# Patient Record
Sex: Male | Born: 1950 | Race: Black or African American | Hispanic: No | Smoking: Never smoker
Health system: Southern US, Community
[De-identification: ages and names within clinical notes are randomized; demographics above are authoritative.]

## PROBLEM LIST (undated history)

## (undated) DIAGNOSIS — E119 Type 2 diabetes mellitus without complications: Secondary | ICD-10-CM

## (undated) DIAGNOSIS — F1990 Other psychoactive substance use, unspecified, uncomplicated: Secondary | ICD-10-CM

## (undated) DIAGNOSIS — M1990 Unspecified osteoarthritis, unspecified site: Secondary | ICD-10-CM

## (undated) DIAGNOSIS — G40909 Epilepsy, unspecified, not intractable, without status epilepticus: Secondary | ICD-10-CM

## (undated) DIAGNOSIS — F1011 Alcohol abuse, in remission: Secondary | ICD-10-CM

## (undated) DIAGNOSIS — R569 Unspecified convulsions: Secondary | ICD-10-CM

## (undated) DIAGNOSIS — E785 Hyperlipidemia, unspecified: Secondary | ICD-10-CM

## (undated) DIAGNOSIS — S0990XA Unspecified injury of head, initial encounter: Secondary | ICD-10-CM

## (undated) DIAGNOSIS — Z950 Presence of cardiac pacemaker: Secondary | ICD-10-CM

## (undated) DIAGNOSIS — I10 Essential (primary) hypertension: Secondary | ICD-10-CM

## (undated) DIAGNOSIS — F1010 Alcohol abuse, uncomplicated: Secondary | ICD-10-CM

## (undated) HISTORY — DX: Presence of cardiac pacemaker: Z95.0

## (undated) HISTORY — DX: Other psychoactive substance use, unspecified, uncomplicated: F19.90

## (undated) HISTORY — DX: Alcohol abuse, in remission: F10.11

## (undated) HISTORY — PX: OTHER SURGICAL HISTORY: SHX169

## (undated) HISTORY — DX: Epilepsy, unspecified, not intractable, without status epilepticus: G40.909

## (undated) HISTORY — DX: Type 2 diabetes mellitus without complications: E11.9

## (undated) HISTORY — DX: Alcohol abuse, uncomplicated: F10.10

## (undated) HISTORY — DX: Hyperlipidemia, unspecified: E78.5

---

## 1978-07-06 HISTORY — PX: LACERATION REPAIR: SHX5168

## 1997-12-10 ENCOUNTER — Ambulatory Visit (HOSPITAL_COMMUNITY): Admission: RE | Admit: 1997-12-10 | Discharge: 1997-12-10 | Payer: Self-pay | Admitting: *Deleted

## 1998-01-14 ENCOUNTER — Ambulatory Visit (HOSPITAL_COMMUNITY): Admission: RE | Admit: 1998-01-14 | Discharge: 1998-01-14 | Payer: Self-pay | Admitting: *Deleted

## 1998-03-11 ENCOUNTER — Ambulatory Visit (HOSPITAL_COMMUNITY): Admission: RE | Admit: 1998-03-11 | Discharge: 1998-03-11 | Payer: Self-pay | Admitting: *Deleted

## 1998-06-28 ENCOUNTER — Emergency Department (HOSPITAL_COMMUNITY): Admission: EM | Admit: 1998-06-28 | Discharge: 1998-06-28 | Payer: Self-pay | Admitting: Emergency Medicine

## 2000-05-13 ENCOUNTER — Emergency Department (HOSPITAL_COMMUNITY): Admission: EM | Admit: 2000-05-13 | Discharge: 2000-05-13 | Payer: Self-pay | Admitting: Internal Medicine

## 2001-12-14 ENCOUNTER — Emergency Department (HOSPITAL_COMMUNITY): Admission: EM | Admit: 2001-12-14 | Discharge: 2001-12-14 | Payer: Self-pay | Admitting: Emergency Medicine

## 2002-02-24 ENCOUNTER — Emergency Department (HOSPITAL_COMMUNITY): Admission: EM | Admit: 2002-02-24 | Discharge: 2002-02-24 | Payer: Self-pay | Admitting: Emergency Medicine

## 2002-02-26 ENCOUNTER — Emergency Department (HOSPITAL_COMMUNITY): Admission: EM | Admit: 2002-02-26 | Discharge: 2002-02-26 | Payer: Self-pay | Admitting: Emergency Medicine

## 2006-12-26 ENCOUNTER — Inpatient Hospital Stay (HOSPITAL_COMMUNITY): Admission: EM | Admit: 2006-12-26 | Discharge: 2006-12-28 | Payer: Self-pay | Admitting: Emergency Medicine

## 2006-12-26 ENCOUNTER — Ambulatory Visit: Payer: Self-pay | Admitting: Family Medicine

## 2006-12-28 ENCOUNTER — Encounter: Payer: Self-pay | Admitting: Family Medicine

## 2006-12-31 ENCOUNTER — Ambulatory Visit: Payer: Self-pay | Admitting: Cardiology

## 2007-01-11 ENCOUNTER — Ambulatory Visit: Payer: Self-pay | Admitting: Cardiology

## 2007-01-13 ENCOUNTER — Ambulatory Visit: Payer: Self-pay | Admitting: Cardiology

## 2007-01-13 ENCOUNTER — Inpatient Hospital Stay (HOSPITAL_COMMUNITY): Admission: EM | Admit: 2007-01-13 | Discharge: 2007-01-15 | Payer: Self-pay | Admitting: Emergency Medicine

## 2007-02-02 ENCOUNTER — Ambulatory Visit (HOSPITAL_COMMUNITY): Admission: RE | Admit: 2007-02-02 | Discharge: 2007-02-02 | Payer: Self-pay | Admitting: Internal Medicine

## 2007-02-02 ENCOUNTER — Ambulatory Visit: Payer: Self-pay

## 2007-02-02 ENCOUNTER — Ambulatory Visit: Payer: Self-pay | Admitting: Internal Medicine

## 2007-02-23 ENCOUNTER — Ambulatory Visit: Payer: Self-pay | Admitting: Internal Medicine

## 2007-02-28 ENCOUNTER — Ambulatory Visit: Payer: Self-pay | Admitting: Cardiology

## 2007-03-09 ENCOUNTER — Ambulatory Visit: Payer: Self-pay | Admitting: Internal Medicine

## 2007-03-18 ENCOUNTER — Ambulatory Visit: Payer: Self-pay | Admitting: Cardiology

## 2007-03-25 ENCOUNTER — Ambulatory Visit: Payer: Self-pay | Admitting: Internal Medicine

## 2007-03-25 ENCOUNTER — Ambulatory Visit: Payer: Self-pay | Admitting: Cardiology

## 2007-03-26 ENCOUNTER — Encounter: Payer: Self-pay | Admitting: Internal Medicine

## 2007-03-26 DIAGNOSIS — N1832 Chronic kidney disease, stage 3b: Secondary | ICD-10-CM | POA: Insufficient documentation

## 2007-03-26 DIAGNOSIS — E118 Type 2 diabetes mellitus with unspecified complications: Secondary | ICD-10-CM | POA: Insufficient documentation

## 2007-03-26 DIAGNOSIS — E119 Type 2 diabetes mellitus without complications: Secondary | ICD-10-CM

## 2007-03-26 DIAGNOSIS — I495 Sick sinus syndrome: Secondary | ICD-10-CM

## 2007-03-26 DIAGNOSIS — R569 Unspecified convulsions: Secondary | ICD-10-CM | POA: Insufficient documentation

## 2007-04-04 ENCOUNTER — Ambulatory Visit: Payer: Self-pay | Admitting: Cardiovascular Disease

## 2007-04-25 ENCOUNTER — Ambulatory Visit: Payer: Self-pay | Admitting: Internal Medicine

## 2007-05-13 ENCOUNTER — Encounter: Payer: Self-pay | Admitting: Endocrinology

## 2007-05-20 ENCOUNTER — Encounter: Payer: Self-pay | Admitting: Internal Medicine

## 2007-05-20 LAB — CONVERTED CEMR LAB
Cholesterol: 144 mg/dL (ref 0–200)
Hgb A1c MFr Bld: 6.1 % — ABNORMAL HIGH (ref 4.6–6.0)
Triglycerides: 78 mg/dL (ref 0–149)
VLDL: 16 mg/dL (ref 0–40)

## 2007-06-22 ENCOUNTER — Ambulatory Visit: Payer: Self-pay | Admitting: Internal Medicine

## 2007-07-25 ENCOUNTER — Ambulatory Visit: Payer: Self-pay | Admitting: Cardiology

## 2007-08-08 ENCOUNTER — Ambulatory Visit (HOSPITAL_COMMUNITY): Admission: RE | Admit: 2007-08-08 | Discharge: 2007-08-08 | Payer: Self-pay | Admitting: Gastroenterology

## 2007-08-18 ENCOUNTER — Ambulatory Visit: Payer: Self-pay | Admitting: Cardiology

## 2007-09-15 ENCOUNTER — Ambulatory Visit: Payer: Self-pay | Admitting: Cardiology

## 2007-10-13 ENCOUNTER — Ambulatory Visit: Payer: Self-pay | Admitting: Internal Medicine

## 2007-10-27 ENCOUNTER — Ambulatory Visit: Payer: Self-pay | Admitting: Internal Medicine

## 2007-10-28 ENCOUNTER — Ambulatory Visit: Payer: Self-pay | Admitting: Internal Medicine

## 2007-12-19 ENCOUNTER — Encounter: Payer: Self-pay | Admitting: Internal Medicine

## 2008-03-30 ENCOUNTER — Ambulatory Visit: Payer: Self-pay | Admitting: Internal Medicine

## 2008-03-30 DIAGNOSIS — M25519 Pain in unspecified shoulder: Secondary | ICD-10-CM

## 2008-03-30 LAB — CONVERTED CEMR LAB
Albumin: 4.4 g/dL (ref 3.5–5.2)
Chloride: 111 meq/L (ref 96–112)
Cholesterol: 156 mg/dL (ref 0–200)
Creatinine, Ser: 1.1 mg/dL (ref 0.4–1.5)
Glucose, Bld: 108 mg/dL — ABNORMAL HIGH (ref 70–99)
Hgb A1c MFr Bld: 5.8 % (ref 4.6–6.0)
LDL Cholesterol: 89 mg/dL (ref 0–99)
Total Protein: 7.5 g/dL (ref 6.0–8.3)
Triglycerides: 85 mg/dL (ref 0–149)
VLDL: 17 mg/dL (ref 0–40)

## 2008-03-31 ENCOUNTER — Encounter: Payer: Self-pay | Admitting: Internal Medicine

## 2008-04-30 ENCOUNTER — Ambulatory Visit: Payer: Self-pay

## 2008-05-10 ENCOUNTER — Telehealth: Payer: Self-pay | Admitting: Internal Medicine

## 2008-05-16 ENCOUNTER — Encounter: Payer: Self-pay | Admitting: Internal Medicine

## 2008-05-16 LAB — CONVERTED CEMR LAB: PSA: 0.75 ng/mL (ref 0.10–4.00)

## 2008-06-01 ENCOUNTER — Ambulatory Visit: Payer: Self-pay | Admitting: Internal Medicine

## 2008-06-01 DIAGNOSIS — I10 Essential (primary) hypertension: Secondary | ICD-10-CM

## 2008-06-01 LAB — CONVERTED CEMR LAB
Blood in Urine, dipstick: NEGATIVE
Nitrite: NEGATIVE
Urobilinogen, UA: NEGATIVE
pH: 6

## 2008-06-03 ENCOUNTER — Telehealth: Payer: Self-pay | Admitting: Internal Medicine

## 2008-09-17 ENCOUNTER — Ambulatory Visit: Payer: Self-pay | Admitting: Internal Medicine

## 2008-10-19 ENCOUNTER — Encounter (INDEPENDENT_AMBULATORY_CARE_PROVIDER_SITE_OTHER): Payer: Self-pay | Admitting: *Deleted

## 2008-11-13 ENCOUNTER — Encounter: Payer: Self-pay | Admitting: Internal Medicine

## 2008-12-24 ENCOUNTER — Encounter: Payer: Self-pay | Admitting: Internal Medicine

## 2008-12-28 ENCOUNTER — Ambulatory Visit: Payer: Self-pay | Admitting: Internal Medicine

## 2009-01-01 ENCOUNTER — Telehealth (INDEPENDENT_AMBULATORY_CARE_PROVIDER_SITE_OTHER): Payer: Self-pay | Admitting: *Deleted

## 2009-01-17 ENCOUNTER — Encounter: Payer: Self-pay | Admitting: Internal Medicine

## 2009-01-28 ENCOUNTER — Telehealth: Payer: Self-pay | Admitting: Internal Medicine

## 2009-02-05 ENCOUNTER — Ambulatory Visit: Payer: Self-pay | Admitting: Internal Medicine

## 2009-02-26 ENCOUNTER — Ambulatory Visit: Payer: Self-pay | Admitting: Internal Medicine

## 2009-02-26 ENCOUNTER — Encounter: Payer: Self-pay | Admitting: Internal Medicine

## 2009-03-31 ENCOUNTER — Telehealth (INDEPENDENT_AMBULATORY_CARE_PROVIDER_SITE_OTHER): Payer: Self-pay | Admitting: *Deleted

## 2009-04-16 ENCOUNTER — Ambulatory Visit: Payer: Self-pay | Admitting: Internal Medicine

## 2009-06-03 ENCOUNTER — Ambulatory Visit: Payer: Self-pay | Admitting: Internal Medicine

## 2009-06-03 LAB — CONVERTED CEMR LAB
Nitrite: NEGATIVE
WBC Urine, dipstick: NEGATIVE
pH: 6

## 2009-06-05 ENCOUNTER — Encounter (INDEPENDENT_AMBULATORY_CARE_PROVIDER_SITE_OTHER): Payer: Self-pay | Admitting: *Deleted

## 2009-06-05 ENCOUNTER — Ambulatory Visit: Payer: Self-pay | Admitting: Internal Medicine

## 2009-06-05 LAB — CONVERTED CEMR LAB
Albumin: 4.4 g/dL (ref 3.5–5.2)
Alkaline Phosphatase: 112 units/L (ref 39–117)
Basophils Absolute: 0.1 10*3/uL (ref 0.0–0.1)
Bilirubin, Direct: 0.1 mg/dL (ref 0.0–0.3)
Eosinophils Relative: 2 % (ref 0.0–5.0)
Glucose, Bld: 70 mg/dL (ref 70–99)
HDL: 49.4 mg/dL (ref >39.00)
Hemoglobin, Urine: NEGATIVE
Hemoglobin: 14.5 g/dL (ref 13.0–17.0)
Hgb A1c MFr Bld: 6.1 % (ref 4.6–6.5)
Ketones, ur: NEGATIVE mg/dL
LDL Cholesterol: 112 mg/dL — ABNORMAL HIGH (ref 0–99)
Lymphs Abs: 2.5 10*3/uL (ref 0.7–4.0)
MCHC: 33.8 g/dL (ref 30.0–36.0)
MCV: 91 fL (ref 78.0–100.0)
Monocytes Absolute: 0.5 10*3/uL (ref 0.1–1.0)
Monocytes Relative: 9.2 % (ref 3.0–12.0)
Neutro Abs: 2.5 10*3/uL (ref 1.4–7.7)
Nitrite: NEGATIVE
Potassium: 3.6 meq/L (ref 3.5–5.1)
RBC: 4.7 M/uL (ref 4.22–5.81)
Sodium: 145 meq/L (ref 135–145)
Triglycerides: 145 mg/dL (ref 0.0–149.0)
Urine Glucose: NEGATIVE mg/dL
WBC: 5.7 10*3/uL (ref 4.5–10.5)
pH: 5.5 (ref 5.0–8.0)

## 2009-06-06 ENCOUNTER — Telehealth: Payer: Self-pay | Admitting: Internal Medicine

## 2009-06-08 ENCOUNTER — Encounter: Payer: Self-pay | Admitting: Internal Medicine

## 2009-07-09 ENCOUNTER — Ambulatory Visit: Payer: Self-pay | Admitting: Internal Medicine

## 2009-07-15 ENCOUNTER — Ambulatory Visit: Payer: Self-pay | Admitting: Internal Medicine

## 2009-09-09 ENCOUNTER — Emergency Department (HOSPITAL_COMMUNITY): Admission: EM | Admit: 2009-09-09 | Discharge: 2009-09-10 | Payer: Self-pay | Admitting: Emergency Medicine

## 2009-10-09 ENCOUNTER — Ambulatory Visit: Payer: Self-pay | Admitting: Internal Medicine

## 2009-10-17 ENCOUNTER — Telehealth (INDEPENDENT_AMBULATORY_CARE_PROVIDER_SITE_OTHER): Payer: Self-pay | Admitting: *Deleted

## 2009-10-23 ENCOUNTER — Encounter: Payer: Self-pay | Admitting: Internal Medicine

## 2009-11-12 ENCOUNTER — Telehealth: Payer: Self-pay | Admitting: Internal Medicine

## 2010-02-18 ENCOUNTER — Ambulatory Visit: Payer: Self-pay | Admitting: Internal Medicine

## 2010-05-14 ENCOUNTER — Encounter: Payer: Self-pay | Admitting: Internal Medicine

## 2010-05-22 ENCOUNTER — Ambulatory Visit: Payer: Self-pay | Admitting: Internal Medicine

## 2010-05-27 ENCOUNTER — Ambulatory Visit: Payer: Self-pay | Admitting: Internal Medicine

## 2010-05-27 LAB — CONVERTED CEMR LAB
Albumin: 4.2 g/dL (ref 3.5–5.2)
Alkaline Phosphatase: 70 units/L (ref 39–117)
Bilirubin, Direct: 0.1 mg/dL (ref 0.0–0.3)
CO2: 27 meq/L (ref 19–32)
Calcium: 9.2 mg/dL (ref 8.4–10.5)
Chloride: 108 meq/L (ref 96–112)
Eosinophils Relative: 1.4 % (ref 0.0–5.0)
HCT: 38.8 % — ABNORMAL LOW (ref 39.0–52.0)
Hemoglobin, Urine: NEGATIVE
Hemoglobin: 13 g/dL (ref 13.0–17.0)
Leukocytes, UA: NEGATIVE
Lymphocytes Relative: 43 % (ref 12.0–46.0)
MCHC: 33.6 g/dL (ref 30.0–36.0)
MCV: 90.1 fL (ref 78.0–100.0)
Monocytes Absolute: 0.7 10*3/uL (ref 0.1–1.0)
PSA: 0.83 ng/mL (ref 0.10–4.00)
Platelets: 133 10*3/uL — ABNORMAL LOW (ref 150.0–400.0)
RBC: 4.3 M/uL (ref 4.22–5.81)
RDW: 13.3 % (ref 11.5–14.6)
Sodium: 143 meq/L (ref 135–145)
Specific Gravity, Urine: 1.025 (ref 1.000–1.030)
Total Bilirubin: 1 mg/dL (ref 0.3–1.2)
Total CHOL/HDL Ratio: 3
Urobilinogen, UA: 0.2 (ref 0.0–1.0)
pH: 6 (ref 5.0–8.0)

## 2010-06-05 ENCOUNTER — Ambulatory Visit: Payer: Self-pay | Admitting: Internal Medicine

## 2010-06-05 LAB — CONVERTED CEMR LAB: Hgb A1c MFr Bld: 6.2 % (ref 4.6–6.5)

## 2010-06-12 ENCOUNTER — Telehealth: Payer: Self-pay | Admitting: Internal Medicine

## 2010-06-18 ENCOUNTER — Encounter: Payer: Self-pay | Admitting: Internal Medicine

## 2010-07-11 ENCOUNTER — Ambulatory Visit
Admission: RE | Admit: 2010-07-11 | Discharge: 2010-07-11 | Payer: Self-pay | Source: Home / Self Care | Attending: Internal Medicine | Admitting: Internal Medicine

## 2010-07-11 ENCOUNTER — Telehealth: Payer: Self-pay | Admitting: Internal Medicine

## 2010-07-14 ENCOUNTER — Telehealth: Payer: Self-pay | Admitting: Internal Medicine

## 2010-07-22 ENCOUNTER — Encounter: Payer: Self-pay | Admitting: Internal Medicine

## 2010-07-22 ENCOUNTER — Ambulatory Visit
Admission: RE | Admit: 2010-07-22 | Discharge: 2010-07-22 | Payer: Self-pay | Source: Home / Self Care | Attending: Internal Medicine | Admitting: Internal Medicine

## 2010-08-05 ENCOUNTER — Ambulatory Visit
Admission: RE | Admit: 2010-08-05 | Discharge: 2010-08-05 | Payer: Self-pay | Source: Home / Self Care | Attending: Internal Medicine | Admitting: Internal Medicine

## 2010-08-05 NOTE — Assessment & Plan Note (Signed)
Summary: pc2/lg   Primary Provider:  Norins  CC:  pacer check.  History of Present Illness: [name] is seen in followup for pacemaker implanted because of symptomatic sinus node dysfunction and profound nocturnal bradycardia not felt to be related to sleep apnea. He has had no further symptoms of lightheadedness following device implantation.  The patient denies SOB, chest pain, edema or palpitations He has stopped taking his ACE inhibitor as his blood pressure has been better controlled. His hemoglobin A1c most recently was 6.1 and his creatinine was 1.1. Evaluation of his left ventricular function by echo in 2008 demonstrated an EF of 55-65%. Suggestions about Myoview scanning from hospital discharge summary apparently were not completed  Current Medications (verified): 1)  Glipizide 5 Mg  Tabs (Glipizide) .... Two Times A Day 2)  Ginkoba 40 Mg  Tabs (Ginkgo Biloba) .... Once Daily 3)  Multivitamins   Tabs (Multiple Vitamin) .... Take 1 Tablet By Mouth Once A Day 4)  Fish Oil 300 Mg  Caps (Omega-3 Fatty Acids) .... Take 1 Tablet By Mouth Once A Day 5)  Caltrate 600+d 600-400 Mg-Unit  Tabs (Calcium Carbonate-Vitamin D) .... Take 2 By Mouth Qd 6)  Cialis 20 Mg Tabs (Tadalafil) .... Take As Needed: 1/2 or 1. 7)  Ibuprofen 800 Mg Tabs (Ibuprofen) .... As Directed As Needed 8)  Gabapentin 300 Mg Caps (Gabapentin) .[name].. 1 By Mouth Q6 As Needed Pain. 9)  Onetouch Ultra Test  Strp (Glucose Blood) .... Cbg Once A Day 10)  Onetouch Ultrasoft Lancets  Misc (Lancets) .... Cbg Once A Day  Allergies (verified): No Known Drug Allergies  Past History:  Past Medical History: Last updated: 05/13/2009 UCD alcohol abuse - h/o IV drug use: heroine & cocaine - h/o Diabetes mellitus, type II Seizure disorder-remote Pacemaker-Medtronic Adapta ADDR01  Past Surgical History: Last updated: 03/25/2007 laceration repair - scalp PTDVP - 7/08 Medtronic DDD   Family History: Last updated:  03/25/2007 father -unknown health history mother - died in her sleep (arrythmia ?) COPD, HTN Two brothers 1 with HTN  Social History: Last updated: 03/25/2007 12th grade education.  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 Attends NA on a regular basis and serves as a sponsor. Married 18 years-divorced. Married 5 years 2nd marriage. 3 daughters previous marriage, 8 grandchildren work - Arts administrator  Vital Signs:  Patient profile:   60 year old male Height:      71 inches Weight:      199 pounds Pulse rate:   83 / minute Pulse rhythm:   regular BP sitting:   154 / 101  (right arm) Cuff size:   regular  Vitals Entered By: Judithe Modest CMA (July 09, 2009 4:40 PM)  Physical Exam  General:  The patient was alert and oriented in no acute distress. HEENT Normal.  Neck veins were flat, carotids were brisk.  Lungs were clear.  Heart sounds were regular without murmurs or gallops.  Abdomen was soft with active bowel sounds. There is no clubbing cyanosis or edema. Skin Warm and dry neurological exam was normal   PPM Specifications Following MD:  Sherryl Manges, MD     PPM Vendor:  Medtronic     PPM Model Number:  [id number]     PPM Serial Number:  [id number] PPM DOI:  01/14/2007      Lead 1    Location: RA     DOI: 01/14/2007     Model #: 8295  Serial #: [id number]     Status: active Lead 2    Location: RV     DOI: 01/14/2007     Model #: [id number]     Serial #: [id number]     Status: active   Indications:  SYMPTOMATIC BRADY      PPM Follow Up Remote Check?  No Battery Voltage:  2.77 V     Battery Est. Longevity:  8 YEARS     Pacer Dependent:  No       PPM Device Measurements Atrium  Amplitude: 2.8 mV, Impedance: 410 ohms, Threshold: 0.375 V at 0.4 msec Right Ventricle  Amplitude: 11.2 mV, Impedance: 523 ohms, Threshold: 0.875 V at 0.4 msec  Episodes MS Episodes:  1833     Percent Mode Switch:  <0.1%     Coumadin:  No Ventricular High  Rate:  1     Atrial Pacing:  12.2%     Ventricular Pacing:  0.4%  Parameters Mode:  MVP     Lower Rate Limit:  60     Upper Rate Limit:  130 Paced AV Delay:  180     Sensed AV Delay:  150 Next Remote Date:  10/09/2009     Next Cardiology Appt Due:  07/07/2010 Tech Comments:  Normal device function.  No changes made today.  VHR episode is SVT with 1:1 conduction.  Pt does [facility] transmissions.  ROV 12 months SK. Counselling psychologist RN BSN  July 09, 2009 4:45 PM   Impression & Recommendations:  Problem # 1:  CARDIAC PACEMAKER-MEDTRONIC ADAPTA ADDR01 (ICD-V45.01) normal device function with infrequent atrial pacing  Problem # 2:  SICK SINUS SYNDROME (ICD-427.81) stable post pm implantation  Problem # 3:  ESSENTIAL HYPERTENSION (ICD-401.9) His BP is elevated this pm;  he attributes this to his bacon at lunch.  We had a LONG discussion re the benefits of renal protection of ACE inhibitors in diabetes He is agreeable to resuming them.  We discussed sideeffects

## 2010-08-05 NOTE — Assessment & Plan Note (Signed)
Summary: [email or web address] [email or web address]   Primary Provider:  Norins   History of Present Illness: Michael Snyder is seen in followup for pacemaker implanted because of symptomatic sinus node dysfunction and profound nocturnal bradycardia not felt to be related to sleep apnea. He has had no further symptoms of lightheadedness following device implantation.  The patient denies SOB, chest pain, edema or palpitations Evaluation of his left ventricular function by echo in 2008 demonstrated an EF of 55-65%. S   Current Medications (verified): 1)  Glipizide 5 Mg  Tabs (Glipizide) .... Two Times A Day 2)  Ginkoba 40 Mg  Tabs (Ginkgo Biloba) .... Once Daily 3)  Multivitamins   Tabs (Multiple Vitamin) .... Take 1 Tablet By Mouth Once A Day 4)  Fish Oil 300 Mg  Caps (Omega-3 Fatty Acids) .... Take 1 Tablet By Mouth Once A Day 5)  Caltrate 600+d 600-400 Mg-Unit  Tabs (Calcium Carbonate-Vitamin D) .... Take 2 By Mouth Qd 6)  Cialis 20 Mg Tabs (Tadalafil) .... Take As Needed: 1/2 or 1. 7)  Ibuprofen 800 Mg Tabs (Ibuprofen) .... As Directed As Needed 8)  Gabapentin 300 Mg Caps (Gabapentin) .[name].. 1 By Mouth Q6 As Needed Pain. 9)  Onetouch Ultra Test  Strp (Glucose Blood) .... Cbg Once A Day 10)  Onetouch Ultrasoft Lancets  Misc (Lancets) .... Cbg Once A Day 11)  Lisinopril-Hydrochlorothiazide 10-12.5 Mg Tabs (Lisinopril-Hydrochlorothiazide) .[name].. 1 By Mouth Once Daily  Allergies (verified): No Known Drug Allergies  Past History:  Past Medical History: Last updated: 05/13/2009 UCD alcohol abuse - h/o IV drug use: heroine & cocaine - h/o Diabetes mellitus, type II Seizure disorder-remote Pacemaker-Medtronic Adapta ADDR01  Vital Signs:  Patient profile:   60 year old male Height:      71 inches Weight:      180 pounds BMI:     25.20 Pulse rate:   83 / minute Resp:     16 per minute BP sitting:   131 / 76  (right arm)  Vitals Entered By: Marrion Coy, CNA (February 18, 2010 12:39 PM)  Physical  Exam  General:  The patient was alert and oriented in no acute distress. HEENT Normal.  Neck veins were flat, carotids were brisk.  Lungs were clear.  Heart sounds were regular without murmurs or gallops.  Abdomen was soft with active bowel sounds. There is no clubbing cyanosis or edema. Skin Warm and dry    PPM Specifications Following MD:  Sherryl Manges, MD     PPM Vendor:  Medtronic     PPM Model Number:  [id number]     PPM Serial Number:  [id number] PPM DOI:  01/14/2007      Lead 1    Location: RA     DOI: 01/14/2007     Model #: 4782     Serial #: [id number]     Status: active Lead 2    Location: RV     DOI: 01/14/2007     Model #: 7846     Serial #: [id number]     Status: active   Indications:  SYMPTOMATIC BRADY      PPM Follow Up Battery Voltage:  2.77 V     Battery Est. Longevity:  7 yrs     Pacer Dependent:  No       PPM Device Measurements Atrium  Amplitude: 2.80 mV, Impedance: 428 ohms, Threshold: 0.50 V at 0.40 msec Right Ventricle  Amplitude: 8.00 mV, Impedance: 389 ohms, Threshold: 0.750 V at 0.40 msec  Episodes MS Episodes:  660     Percent Mode Switch:  <0.1%     Coumadin:  No Ventricular High Rate:  6     Atrial Pacing:  14.6%     Ventricular Pacing:  0.6%  Parameters Mode:  MVP     Lower Rate Limit:  60     Upper Rate Limit:  130 Paced AV Delay:  180     Sensed AV Delay:  150 Next Remote Date:  05/06/2010     Next Cardiology Appt Due:  02/04/2011 Tech Comments:  660 MODE SWITCHES--ALL LESS THAN 1 MINUTE.  6 VHR EPISODES--LONGEST WAS 1 MINUTE. NORMAL DEVICE FUNCTION.  CHANGED RA OUTPUT FROM 1.5 TO 2.0 AND RV OUTPUT FROM 2.0 TO 2.5 V.  CARELINK CHECK 05-22-2010.  ROV IN 12 MTHS W/SK. Vella Kohler  February 18, 2010 12:56 PM  Impression & Recommendations:  Problem # 1:  CARDIAC PACEMAKER-MEDTRONIC ADAPTA ADDR01 (ICD-V45.01) Device parameters and data were reviewed and no changes were made  Problem # 2:  SICK SINUS SYNDROME (ICD-427.81) 14% atrial pacing    Patient Instructions: 1)  Your physician recommends that you schedule a follow-up appointment in: YEAR WITH DR Graciela Husbands 2)  Your physician recommends that you continue on your current medications as directed. Please refer to the Current Medication list given to you today.

## 2010-08-05 NOTE — Cardiovascular Report (Signed)
Summary: Office Visit Remote  Office Visit Remote   Imported By: Roderic Ovens 10/23/2009 14:27:25  _____________________________________________________________________  External Attachment:    Type:   Image     Comment:   External Document

## 2010-08-05 NOTE — Progress Notes (Signed)
Summary: results from pacemaker transmission  Phone Note Call from Patient Call back at Home Phone [phone]   Caller: Patient Summary of Call: Pt would results for pacemaker transmission Initial call taken by: Judie Grieve,  June 12, 2010 8:43 AM  Follow-up for Phone Call        spoke w/pt and pt aware of normal transmission.  pt also aware of next ov w/sk on 08-05-10 @ 1050. Vella Kohler  June 12, 2010 10:56 AM

## 2010-08-05 NOTE — Progress Notes (Signed)
Summary: device transmit  Phone Note Call from Patient Call back at Home Phone [phone] Call back at Work Phone [phone]   Caller: Patient Reason for Call: Talk to Nurse Summary of Call: pt did his transmit on April 6, has not heard anything Initial call taken by: Migdalia Dk,  October 17, 2009 11:43 AM  Follow-up for Phone Call        transmission recieved, patient aware. Follow-up by: Altha Harm, LPN,  October 17, 2009 2:31 PM

## 2010-08-05 NOTE — Progress Notes (Signed)
Summary: cialis  Phone Note Call from Patient   Initial call taken by: Rock Nephew CMA,  Nov 12, 2009 3:44 PM Caller: Patient Reason for Call: Refill Medication Summary of Call: Patient called [facility] stating that he need rx info sent to [facility]. I returned call to pt, need to clarify if this is an rx refill or prior authorization request. Initial call taken by: Rock Nephew CMA,  Nov 12, 2009 3:48 PM  Follow-up for Phone Call        Patient is requesting a refill of cialis to go to [facility] for 3 mth supply w/3rfs, OK?  Follow-up by: [name], CMA,  Nov 12, 2009 5:23 PM  Additional Follow-up for Phone Call Additional follow up Details #1::        ok #18 with 3 refills Additional Follow-up by: [name] MD,  Nov 12, 2009 5:44 PM    Prescriptions: CIALIS 20 MG TABS (TADALAFIL) take as needed: 1/2 or 1.  #18 x 3   Entered by:   [name], CMA   Authorized by:   [name] MD   Signed by:   [name], CMA on 11/12/2009   Method used:   Faxed to ...       [facility] (mail-order)             , [place]         Ph:        Fax: [phone]   RxID:   [id number]

## 2010-08-05 NOTE — Assessment & Plan Note (Signed)
Summary: cpx-lb   Vital Signs:  Patient profile:   60 year old male Height:      71 inches Weight:      181 pounds BMI:     25.34 O2 Sat:      99 % on Room air Temp:     97.8 degrees F oral Pulse rate:   64 / minute BP sitting:   110 / 82  (left arm) Cuff size:   regular  Vitals Entered By: Bill Salinas CMA (June 05, 2010 2:56 PM)  O2 Flow:  Room air CC: cpx  Vision Screening:Left eye w/o correction: 20 / 40 Right Eye w/o correction: 20 / 60 Both eyes w/o correction:  20/ 40 Left eye with correction: 20 / 20 Right eye with correction: 20 / 20 Both eyes with correction: 20 / 20       Vision Comments: pt due for eye exam  Vision Entered By: [name], CMA (June 05, 2010 3:54 PM)   Primary Care Provider:  Sallie Maker  CC:  cpx.  History of Present Illness: Patient presents for routine and driver's physical exam. He is feeling GREAT. He has no specific medical complaints. He is compliant with all his medications. He reports that his CBGs are in the 90's. He is current with Cardiology for follow-up and interrogation of his pacemaker.  Preventive Screening-Counseling & Management  Alcohol-Tobacco     Alcohol drinks/day: 0     Smoking Status: never  Caffeine-Diet-Exercise     Caffeine use/day: none     Does Patient Exercise: yes     Type of exercise: walking     Exercise (avg: min/session): 30-60     Times/week: 7  Hep-HIV-STD-Contraception     Dental Visit-last 6 months no     Sun Exposure-Excessive: no  Safety-Violence-Falls     Seat Belt Use: yes     Helmet Use: n/a     Firearms in the Home: no firearms in the home     Smoke Detectors: yes     Violence in the Home: no risk noted     Sexual Abuse: no     Fall Risk: low fall risk  Current Medications (verified): 1)  Glipizide 5 Mg  Tabs (Glipizide) .... Two Times A Day 2)  Ginkoba 40 Mg  Tabs (Ginkgo Biloba) .... Once Daily 3)  Multivitamins   Tabs (Multiple Vitamin) .... Take 1 Tablet By Mouth  Once A Day 4)  Fish Oil 300 Mg  Caps (Omega-3 Fatty Acids) .... Take 1 Tablet By Mouth Once A Day 5)  Caltrate 600+d 600-400 Mg-Unit  Tabs (Calcium Carbonate-Vitamin D) .... Take 2 By Mouth Qd 6)  Cialis 20 Mg Tabs (Tadalafil) .... Take As Needed: 1/2 or 1. 7)  Ibuprofen 800 Mg Tabs (Ibuprofen) .... As Directed As Needed 8)  Gabapentin 300 Mg Caps (Gabapentin) .[name].. 1 By Mouth Q6 As Needed Pain. 9)  Onetouch Ultra Test  Strp (Glucose Blood) .... Cbg Once A Day 10)  Onetouch Ultrasoft Lancets  Misc (Lancets) .... Cbg Once A Day 11)  Lisinopril-Hydrochlorothiazide 10-12.5 Mg Tabs (Lisinopril-Hydrochlorothiazide) .[name].. 1 By Mouth Once Daily  Allergies: No Known Drug Allergies  Past History:  Past Medical History: Last updated: 05/13/2009 UCD alcohol abuse - h/o IV drug use: heroine & cocaine - h/o Diabetes mellitus, type II Seizure disorder-remote Pacemaker-Medtronic Adapta ADDR01  Past Surgical History: Last updated: 03/25/2007 laceration repair - scalp PTDVP - 7/08 Medtronic DDD   Family History: Last updated:  03/25/2007 father -unknown health history mother - died in her sleep (arrythmia ?) COPD, HTN Two brothers 1 with HTN  Social History: Last updated: 03/25/2007 12th grade education.  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 Attends NA on a regular basis and serves as a sponsor. Married 18 years-divorced. Married 5 years 2nd marriage. 3 daughters previous marriage, 8 grandchildren work - Arts administrator  Social History: Caffeine use/day:  none Does Patient Exercise:  yes Dental Care w/in 6 mos.:  no Sun Exposure-Excessive:  no Risk analyst Use:  yes Fall Risk:  low fall risk  Review of Systems  The patient denies anorexia, fever, weight loss, weight gain, vision loss, decreased hearing, hoarseness, chest pain, syncope, dyspnea on exertion, peripheral edema, prolonged cough, headaches, hemoptysis, abdominal pain, melena,  hematochezia, severe indigestion/heartburn, hematuria, incontinence, muscle weakness, suspicious skin lesions, transient blindness, difficulty walking, depression, unusual weight change, abnormal bleeding, enlarged lymph nodes, and testicular masses.    Physical Exam  General:  WNWD AA male in no distress Head:  Normocephalic and atraumatic without obvious abnormalities. No apparent alopecia or balding. Eyes:  No corneal or conjunctival inflammation noted. EOMI. Perrla. Funduscopic exam benign, without hemorrhages, exudates or papilledema. Vision grossly normal. Ears:  External ear exam shows no significant lesions or deformities.  Otoscopic examination reveals clear canals, tympanic membranes are intact bilaterally without bulging, retraction, inflammation or discharge. Hearing is grossly normal bilaterally. Nose:  no external deformity and no external erythema.   Mouth:  partial denture, no buccal lesions, no palatal lesions, posterior pharynx clear Neck:  supple, full ROM, no thyromegaly, and no carotid bruits.   Chest Wall:  pacemaker upper left chest Lungs:  Normal respiratory effort, chest expands symmetrically. Lungs are clear to auscultation, no crackles or wheezes. Heart:  Normal rate and regular rhythm. S1 and S2 normal without gallop, murmur, click, rub or other extra sounds. Abdomen:  soft, non-tender, normal bowel sounds, no distention, no masses, no guarding, and no hepatomegaly.   Prostate:  deferred to normal PSA Msk:  drop-off left shoulder, normal ROM, no joint swelling, no joint warmth, no redness over joints, and no joint instability.   Pulses:  2+ radial and DP pulses Extremities:  No clubbing, cyanosis, edema, or deformity noted with normal full range of motion of all joints.   Neurologic:  alert & oriented X3, cranial nerves II-XII intact, strength normal in all extremities, gait normal, and DTRs symmetrical and normal.   Skin:  turgor normal, color normal, no rashes, no  suspicious lesions, and no ulcerations.   Cervical Nodes:  no anterior cervical adenopathy and no posterior cervical adenopathy.   Psych:  Oriented X3, memory intact for recent and remote, good eye contact, and not anxious appearing.    Diabetes Management Exam:    Foot Exam (with socks and/or shoes not present):       Sensory-Pinprick/Light touch:          Left medial foot (L-4): normal          Left dorsal foot (L-5): normal          Left lateral foot (S-1): normal          Right medial foot (L-4): normal          Right dorsal foot (L-5): normal          Right lateral foot (S-1): normal       Sensory-other: normal deep vibratory sensation  Inspection:          Left foot: normal          Right foot: normal       Nails:          Left foot: normal          Right foot: normal    Impression & Recommendations:  Problem # 1:  SICK SINUS SYNDROME (ICD-427.81) stable with pacemaker. See cardiology  notes  Problem # 2:  ESSENTIAL HYPERTENSION (ICD-401.9)  His updated medication list for this problem includes:    Lisinopril-hydrochlorothiazide 10-12.5 Mg Tabs (Lisinopril-hydrochlorothiazide) .[name]... 1 by mouth once daily  BP today: 110/82 Prior BP: 131/76 (02/18/2010)  Labs Reviewed: K+: 3.6 (05/27/2010) Creat: : 1.4 (05/27/2010)   Great control - continue present medications  Problem # 3:  SHOULDER PAIN, RIGHT (ICD-719.41) still with some limitation in range of motion left shoulder. Minimal discomfort that is well controlled with NSAIDs  His updated medication list for this problem includes:    Ibuprofen 800 Mg Tabs (Ibuprofen) .[name]... As directed as needed  Problem # 4:  DIABETES MELLITUS, TYPE II (ICD-250.00) Due for A1C with recommendations to follow.   His updated medication list for this problem includes:    Glipizide 5 Mg Tabs (Glipizide) .[name]..[name] Two times a day    Lisinopril-hydrochlorothiazide 10-12.5 Mg Tabs (Lisinopril-hydrochlorothiazide) .[name]... 1 by mouth once  daily  Orders: TLB-A1C / Hgb A1C (Glycohemoglobin) (83036-A1C)  Addendum - 6.2%  Plan - continue present regimen  Problem # 5:  Preventive Health Care (ICD-V70.0) interval history is negative. Physical exam is normal. Lab results are within normal limits showing good control of diabetes and lipids. He is a candidate for colonoscopy - he will call when ready. Immunizations - Tetnus Sept '09; flu Dec '11  In summary a very nice man who is medically stable. DMV chauffer's license application completed.   Complete Medication List: 1)  Glipizide 5 Mg Tabs (Glipizide) .... Two times a day 2)  Ginkoba 40 Mg Tabs (Ginkgo biloba) .... Once daily 3)  Multivitamins Tabs (Multiple vitamin) .... Take 1 tablet by mouth once a day 4)  Fish Oil 300 Mg Caps (Omega-3 fatty acids) .... Take 1 tablet by mouth once a day 5)  Caltrate 600+d 600-400 Mg-unit Tabs (Calcium carbonate-vitamin d) .... Take 2 by mouth qd 6)  Cialis 20 Mg Tabs (Tadalafil) .... Take as needed: 1/2 or 1. 7)  Ibuprofen 800 Mg Tabs (Ibuprofen) .... As directed as needed 8)  Gabapentin 300 Mg Caps (Gabapentin) .[name].. 1 by mouth q6 as needed pain. 9)  Onetouch Ultra Test Strp (Glucose blood) .... Cbg once a day 10)  Onetouch Ultrasoft Lancets Misc (Lancets) .... Cbg once a day 11)  Lisinopril-hydrochlorothiazide 10-12.5 Mg Tabs (Lisinopril-hydrochlorothiazide) .[name].. 1 by mouth once daily  Other Orders: Admin 1st Vaccine (76283) Flu Vaccine 1yrs + (15176)   Patient: [name] Note: All result statuses are Final unless otherwise noted.  Tests: (1) Lipid Panel (LIPID)   Cholesterol               159 mg/dL                   1-607     ATP III Classification            Desirable:  < 200 mg/dL                    Borderline High:  200 - 239 mg/dL  High:  > = 240 mg/dL   Triglycerides             96.0 mg/dL                  1.9-147.8     Normal:  <150 mg/dL     Borderline High:  295 - 199 mg/dL   HDL                        46.10 mg/dL                 >62.13   VLDL Cholesterol          19.2 mg/dL                  0.8-65.7   LDL Cholesterol           94 mg/dL                    8-46  CHO/HDL Ratio:  CHD Risk                             3                    Men          Women     1/2 Average Risk     3.4          3.3     Average Risk          5.0          4.4     2X Average Risk          9.6          7.1     3X Average Risk          15.0          11.0                           Tests: (2) BMP (METABOL)   Sodium                    143 mEq/L                   135-145   Potassium                 3.6 mEq/L                   3.5-5.1   Chloride                  108 mEq/L                   96-112   Carbon Dioxide            27 mEq/L                    19-32   Glucose              [H]  116 mg/dL                   96-29   BUN                       19 mg/dL  6-23   Creatinine                1.4 mg/dL                   6.2-1.3   Calcium                   9.2 mg/dL                   0.8-65.7   GFR                       67.43 mL/min                >60  Tests: (3) CBC Platelet w/Diff (CBCD)   White Cell Count          5.9 K/uL                    4.5-10.5   Red Cell Count            4.30 Mil/uL                 4.22-5.81   Hemoglobin                13.0 g/dL                   84.6-96.2   Hematocrit           [L]  38.8 %                      39.0-52.0   MCV                       90.1 fl                     78.0-100.0   MCHC                      33.6 g/dL                   95.2-84.1   RDW                       13.3 %                      11.5-14.6   Platelet Count       [L]  133.0 K/uL                  150.0-400.0   Neutrophil %              44.0 %                      43.0-77.0   Lymphocyte %              43.0 %                      12.0-46.0   Monocyte %                11.1 %                      3.0-12.0   Eosinophils%              1.4 %  0.0-5.0   Basophils %               0.5  %                       0.0-3.0   Neutrophill Absolute      2.6 K/uL                    1.4-7.7   Lymphocyte Absolute       2.5 K/uL                    0.7-4.0   Monocyte Absolute         0.7 K/uL                    0.1-1.0  Eosinophils, Absolute                             0.1 K/uL                    0.0-0.7   Basophils Absolute        0.0 K/uL                    0.0-0.1  Tests: (4) Hepatic/Liver Function Panel (HEPATIC)   Total Bilirubin           1.0 mg/dL                   9.5-1.8   Direct Bilirubin          0.1 mg/dL                   8.4-1.6   Alkaline Phosphatase      70 U/L                      39-117   AST                       32 U/L                      0-37   ALT                       29 U/L                      0-53   Total Protein             6.7 g/dL                    6.0-6.3   Albumin                   4.2 g/dL                    0.1-6.0  Tests: (5) TSH (TSH)   FastTSH                   2.27 uIU/mL                 0.35-5.50  Tests: (6) UDip Only (UDIP)   Color                     Yellow       RANGE:  Yellow;Lt.  Yellow   Clarity                   CLEAR                       Clear   Specific Gravity          1.025                       1.000 - 1.030   Urine Ph                  6.0                         5.0-8.0   Protein                   NEGATIVE                    Negative   Urine Glucose             NEGATIVE                    Negative   Ketones                   NEGATIVE                    Negative   Urine Bilirubin           NEGATIVE                    Negative   Blood                     NEGATIVE                    Negative   Urobilinogen              0.2                         0.0 - 1.0   Leukocyte Esterace        NEGATIVE                    Negative   Nitrite                   NEGATIVE                    Negative  Tests: (7) Prostate Specific Antigen (PSA)   PSA-Hyb                   0.83 ng/mL                  0.10-4.00 Tests: (1) Hemoglobin A1C (A1C)    Hemoglobin A1C            6.2 %                       4.6-6.5  Orders Added: 1)  Admin 1st Vaccine [90471] 2)  Flu Vaccine 12yrs + [90658] 3)  TLB-A1C / Hgb A1C (Glycohemoglobin) [83036-A1C] 4)  Est. Patient 40-64 years [99396]   Flu Vaccine Consent Questions     Do you have a history of severe allergic reactions to this vaccine? no  Any prior history of allergic reactions to egg and/or gelatin? no    Do you have a sensitivity to the preservative Thimersol? no    Do you have a past history of Guillan-Barre Syndrome? no    Do you currently have an acute febrile illness? no    Have you ever had a severe reaction to latex? no    Vaccine information given and explained to patient? yes    Are you currently pregnant? no    Lot Number:[id number]   Exp Date:01/03/2011   Site Given  Right Deltoid IM .lbflu1 [facility] Care Screening2-CCC]   Laboratory Results

## 2010-08-05 NOTE — Letter (Signed)
Summary: Remote Device Check  [facility], Main Office  [address]   [place], [place] [address]   Phone: [phone]  Fax: [phone]     October 23, 2009 MRN: [id number]   Michael Snyder [address] [place], [place]  [address]   Dear [name],   Your remote transmission was recieved and reviewed by your physician.  All diagnostics were within normal limits for you.    ___X___Your next office visit is scheduled for:   JULY 2011 WITH DR Graciela Husbands. Please call our office to schedule an appointment.    Sincerely,  Proofreader

## 2010-08-05 NOTE — Assessment & Plan Note (Signed)
Summary: WALK IN, CHANGE BP MED/CD   Vital Signs:  Patient profile:   60 year old male Height:      71 inches Weight:      193 pounds BMI:     27.02 O2 Sat:      98 % on Room air Temp:     97.1 degrees F oral Pulse rate:   68 / minute BP sitting:   158 / 110  (left arm) Cuff size:   regular  Vitals Entered By: Bill Salinas CMA (July 15, 2009 11:41 AM)  O2 Flow:  Room air CC: office visit to discuss BP medication/ ab   Primary Care Provider:  Ladaja Yusupov  CC:  office visit to discuss BP medication/ ab.  History of Present Illness: Presents as a walk in patient for high blood pressure. He had stopped his ACE and since that time his BP has trended upward. He has been asymptomatic: no epistaxis, HA, double vision, chest pain.   Current Medications (verified): 1)  Glipizide 5 Mg  Tabs (Glipizide) .... Two Times A Day 2)  Ginkoba 40 Mg  Tabs (Ginkgo Biloba) .... Once Daily 3)  Multivitamins   Tabs (Multiple Vitamin) .... Take 1 Tablet By Mouth Once A Day 4)  Fish Oil 300 Mg  Caps (Omega-3 Fatty Acids) .... Take 1 Tablet By Mouth Once A Day 5)  Caltrate 600+d 600-400 Mg-Unit  Tabs (Calcium Carbonate-Vitamin D) .... Take 2 By Mouth Qd 6)  Cialis 20 Mg Tabs (Tadalafil) .... Take As Needed: 1/2 or 1. 7)  Ibuprofen 800 Mg Tabs (Ibuprofen) .... As Directed As Needed 8)  Gabapentin 300 Mg Caps (Gabapentin) .[name].. 1 By Mouth Q6 As Needed Pain. 9)  Onetouch Ultra Test  Strp (Glucose Blood) .... Cbg Once A Day 10)  Onetouch Ultrasoft Lancets  Misc (Lancets) .... Cbg Once A Day  Allergies (verified): No Known Drug Allergies  Past History:  Past Medical History: Last updated: 05/13/2009 UCD alcohol abuse - h/o IV drug use: heroine & cocaine - h/o Diabetes mellitus, type II Seizure disorder-remote Pacemaker-Medtronic Adapta ADDR01  Past Surgical History: Last updated: 03/25/2007 laceration repair - scalp PTDVP - 7/08 Medtronic DDD   Family History: Last updated: 03/25/2007 father  -unknown health history mother - died in her sleep (arrythmia ?) COPD, HTN Two brothers 1 with HTN  Social History: Last updated: 03/25/2007 12th grade education.  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 Attends NA on a regular basis and serves as a sponsor. Married 18 years-divorced. Married 5 years 2nd marriage. 3 daughters previous marriage, 8 grandchildren work - Arts administrator  Risk Factors: Smoking Status: never (05/13/2007)  Review of Systems  The patient denies anorexia, fever, weight loss, weight gain, hoarseness, chest pain, dyspnea on exertion, headaches, hemoptysis, and abdominal pain.    Physical Exam  General:  Well-developed,well-nourished,in no acute distress; alert,appropriate and cooperative throughout examination Head:  Normocephalic and atraumatic without obvious abnormalities. No apparent alopecia or balding. Eyes:  pupils equal, pupils round, corneas and lenses clear, and no injection.   Lungs:  normal respiratory effort and normal breath sounds.   Heart:  normal rate and regular rhythm.     Impression & Recommendations:  Problem # 1:  ESSENTIAL HYPERTENSION (ICD-401.9) Discussed the value and importance of BP control. Explained the multiple benefits of ACE-I regarding renal protection in DM, cardiac efficiency and stabilization of endovascular melieu.  Plan - resume treatment with lisinopril/hct once a day.  BP check at home or office in 10-14 days.   His updated medication list for this problem includes:    Lisinopril-hydrochlorothiazide 10-12.5 Mg Tabs (Lisinopril-hydrochlorothiazide) .[name]... 1 by mouth once daily  Complete Medication List: 1)  Glipizide 5 Mg Tabs (Glipizide) .... Two times a day 2)  Ginkoba 40 Mg Tabs (Ginkgo biloba) .... Once daily 3)  Multivitamins Tabs (Multiple vitamin) .... Take 1 tablet by mouth once a day 4)  Fish Oil 300 Mg Caps (Omega-3 fatty acids) .... Take 1 tablet by mouth  once a day 5)  Caltrate 600+d 600-400 Mg-unit Tabs (Calcium carbonate-vitamin d) .... Take 2 by mouth qd 6)  Cialis 20 Mg Tabs (Tadalafil) .... Take as needed: 1/2 or 1. 7)  Ibuprofen 800 Mg Tabs (Ibuprofen) .... As directed as needed 8)  Gabapentin 300 Mg Caps (Gabapentin) .[name].. 1 by mouth q6 as needed pain. 9)  Onetouch Ultra Test Strp (Glucose blood) .... Cbg once a day 10)  Onetouch Ultrasoft Lancets Misc (Lancets) .... Cbg once a day 11)  Lisinopril-hydrochlorothiazide 10-12.5 Mg Tabs (Lisinopril-hydrochlorothiazide) .[name].. 1 by mouth once daily Prescriptions: LISINOPRIL-HYDROCHLOROTHIAZIDE 10-12.5 MG TABS (LISINOPRIL-HYDROCHLOROTHIAZIDE) 1 by mouth once daily  #90 x 3   Entered and Authorized by:   [name] MD   Signed by:   [name] MD on 07/15/2009   Method used:   Electronically to        [facility]* (mail-order)             ,          Ph: [phone]       Fax: [phone]   RxID:   [id number] LISINOPRIL-HYDROCHLOROTHIAZIDE 10-12.5 MG TABS (LISINOPRIL-HYDROCHLOROTHIAZIDE) 1 by mouth once daily  #30 x 0   Entered and Authorized by:   [name] MD   Signed by:   [name] MD on 07/15/2009   Method used:   Electronically to        [facility] [phone]* (retail)       [address]       [place], [place]  [address]       Ph: [phone]       Fax: [phone]   RxID:   [id number]

## 2010-08-07 NOTE — Progress Notes (Signed)
Summary: Patient requesting a Rx for hip pain.  Phone Note Call from Patient   Summary of Call: Patient walked in and requested Dr. Debby Bud to call in a Prescription for hip pain. Please Advise.[phone] Initial call taken by: Daphane Shepherd,  July 14, 2010 2:24 PM  Follow-up for Phone Call        if ibuprofen and gabapentin are not controlling his pain he may have tramadol 50mg  2 tabs three times a day. #100 with 1 refill Follow-up by: [name] MD,  July 15, 2010 9:32 AM  Additional Follow-up for Phone Call Additional follow up Details #1::        Pt informed  Additional Follow-up by: [name], CMA,  July 15, 2010 9:58 AM    New/Updated Medications: TRAMADOL HCL 50 MG TABS (TRAMADOL HCL) 2 three times a day as needed pain Prescriptions: TRAMADOL HCL 50 MG TABS (TRAMADOL HCL) 2 three times a day as needed pain  #100 x 1   Entered by:   [name], CMA   Authorized by:   [name] MD   Signed by:   [name], CMA on 07/15/2010   Method used:   Electronically to        [facility] [address] [id number]* (retail)       [address]       [place], [place]  [address]       Ph: [phone]       Fax: [phone]   RxID:   [id number]
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
## 2010-08-07 NOTE — Cardiovascular Report (Signed)
Summary: Office Visit Remote   Office Visit Remote   Imported By: Roderic Ovens 06/19/2010 14:10:28  _____________________________________________________________________  External Attachment:    Type:   Image     Comment:   External Document

## 2010-08-07 NOTE — Letter (Signed)
Summary: Remote Device Check  [facility], Main Office  [address]   [place], [place] [address]   Phone: [phone]  Fax: [phone]     June 18, 2010 MRN: [id number]   Michael Snyder [address] [place], [place]  [address]   Dear [name],   Your remote transmission was recieved and reviewed by your physician.  All diagnostics were within normal limits for you.  __X____Your next office visit is scheduled for:  08-05-2010 @ 1050 with Dr Graciela Husbands.     Sincerely,  Vella Kohler
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
## 2010-08-08 NOTE — Cardiovascular Report (Signed)
Summary: Office Visit   Office Visit   Imported By: Roderic Ovens 02/19/2010 11:58:37  _____________________________________________________________________  External Attachment:    Type:   Image     Comment:   External Document

## 2010-08-08 NOTE — Letter (Signed)
Summary: Division of [employer]  Division of [employer]   Imported By: Lennie Odor 06/09/2010 11:13:34  _____________________________________________________________________  External Attachment:    Type:   Image     Comment:   External Document
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
## 2010-08-18 ENCOUNTER — Telehealth (INDEPENDENT_AMBULATORY_CARE_PROVIDER_SITE_OTHER): Payer: Self-pay | Admitting: *Deleted

## 2010-08-21 ENCOUNTER — Telehealth: Payer: Self-pay | Admitting: Internal Medicine

## 2010-08-26 ENCOUNTER — Telehealth: Payer: Self-pay | Admitting: Internal Medicine

## 2010-08-27 ENCOUNTER — Telehealth: Payer: Self-pay | Admitting: Internal Medicine

## 2010-08-27 NOTE — Progress Notes (Signed)
Summary: REQ FOR RX  Phone Note Call from Patient Call back at Home Phone [phone] Call back at [facility] VM ON HM #   Summary of Call: Pt was given rx for flexeril in the past for hip pain given by UC. Patient is requesting refil.   [facility] [address] Initial call taken by: [name], CMA,  August 21, 2010 9:18 AM  Follow-up for Phone Call        ok for cyclobenzaprine 10mg  sig 1 by mouth three times a day for muscle spasm. # 90 no refills Follow-up by: [name] MD,  August 21, 2010 5:56 PM  Additional Follow-up for Phone Call Additional follow up Details #1::        Pt informed  Additional Follow-up by: [name], CMA,  August 21, 2010 6:09 PM    New/Updated Medications: CYCLOBENZAPRINE HCL 10 MG TABS (CYCLOBENZAPRINE HCL) 1 three times a day as needed muscle spasm Prescriptions: CYCLOBENZAPRINE HCL 10 MG TABS (CYCLOBENZAPRINE HCL) 1 three times a day as needed muscle spasm  #90 x 0   Entered by:   [name], CMA   Authorized by:   [name] MD   Signed by:   [name], CMA on 08/21/2010   Method used:   Electronically to        [facility] [address] [id number]* (retail)       [address]       [place], [place]  [address]       Ph: [phone]       Fax: [phone]   RxID:   [id number]

## 2010-08-27 NOTE — Progress Notes (Signed)
  Walk in Patient Form Recieved " Pt has Pacemaker Needs ok to Have MRI done" sent to [facility]  August 18, 2010 1:31 PM     Appended Document:  Per note from pt ---Pt has pacemaker and Dr Allison Quarry needs an okay to do CT or MRI scan.  Pt possibly has some nerve damage in Right leg.  Call pt at [phone].  I called the pt and made him aware the he CANNOT have an MRI with pacemaker.  The pt can have a CT scan if needed.

## 2010-09-01 ENCOUNTER — Ambulatory Visit (INDEPENDENT_AMBULATORY_CARE_PROVIDER_SITE_OTHER)
Admission: RE | Admit: 2010-09-01 | Discharge: 2010-09-01 | Disposition: A | Discharge status: Home / Self Care | Payer: 59 | Source: Ambulatory Visit | Attending: Internal Medicine | Admitting: Internal Medicine

## 2010-09-01 ENCOUNTER — Ambulatory Visit (INDEPENDENT_AMBULATORY_CARE_PROVIDER_SITE_OTHER): Payer: 59 | Admitting: Internal Medicine

## 2010-09-01 ENCOUNTER — Other Ambulatory Visit: Payer: Self-pay | Admitting: Internal Medicine

## 2010-09-01 ENCOUNTER — Encounter: Payer: Self-pay | Admitting: Internal Medicine

## 2010-09-01 DIAGNOSIS — M5430 Sciatica, unspecified side: Secondary | ICD-10-CM

## 2010-09-02 ENCOUNTER — Telehealth: Payer: Self-pay | Admitting: Internal Medicine

## 2010-09-02 NOTE — Progress Notes (Signed)
Summary: CT?    Phone Note Call from Patient Call back at [facility] Phone [phone]   Summary of Call: Patient is requesting call back regarding test that chiropractor is suggesting.  Initial call taken by: [name], CMA,  August 26, 2010 2:54 PM  Follow-up for Phone Call        left mess to call office back................Marland KitchenLamar Sprinkles, CMA  August 26, 2010 5:57 PM   Pt dropped off a note written on [facility] notep[facility] - Needs CT Myelogram of lumbar spine to look for herniated disc/compressed nerve root.  Please advise..............Marland KitchenLamar Sprinkles, CMA  August 27, 2010 12:03 PM   Additional Follow-up for Phone Call Additional follow up Details #1::        Will need ov before ordering such a high complexity test.  Additional Follow-up by: [name] MD,  August 27, 2010 12:54 PM    Additional Follow-up for Phone Call Additional follow up Details #2::    Pt informed, transferred to scheduler for office visit.  Follow-up by: [name], CMA,  August 27, 2010 3:35 PM

## 2010-09-02 NOTE — Progress Notes (Signed)
  Phone Note Refill Request Message from:  Fax from Pharmacy on August 27, 2010 7:58 AM  Refills Requested: Medication #1:  CYCLOBENZAPRINE HCL 10 MG TABS 1 three times a day as needed muscle spasm. Please Advise refills  Initial call taken by: Ami Bullins CMA,  August 27, 2010 7:58 AM  Follow-up for Phone Call        ok for refill x 3 Follow-up by: [name] MD,  August 27, 2010 12:54 PM    Prescriptions: CYCLOBENZAPRINE HCL 10 MG TABS (CYCLOBENZAPRINE HCL) 1 three times a day as needed muscle spasm  #90 x 3   Entered by:   Ami Bullins CMA   Authorized by:   [name] MD   Signed by:   Bill Salinas CMA on 08/27/2010   Method used:   Electronically to        [facility] [address] [phone]* (retail)       [address]       [place], [place]  [address]       Ph: [phone]       Fax: [phone]   RxID:   [id number]

## 2010-09-03 ENCOUNTER — Telehealth (INDEPENDENT_AMBULATORY_CARE_PROVIDER_SITE_OTHER): Payer: Self-pay | Admitting: *Deleted

## 2010-09-04 ENCOUNTER — Telehealth: Payer: Self-pay | Admitting: Internal Medicine

## 2010-09-04 ENCOUNTER — Encounter: Payer: Self-pay | Admitting: Internal Medicine

## 2010-09-11 NOTE — Letter (Signed)
Summary: Generic Letter  [facility]  [address] [place], [place] [address]   Phone: [phone]  Fax: [phone]    09/04/2010  Michael Snyder [address] [place], [place]  [address]  [place]     The above named patient is clear to return to work on 09/08/2010 with no restrictions. Please call our office with any questions.         Sincerely,   [name], CMA (AAMA) for Dr Judie Petit. [place]

## 2010-09-11 NOTE — Progress Notes (Signed)
----   Converted from flag ---- ---- 09/03/2010 8:37 AM, [name] MD wrote: My mistake. Sorry. Cancel the order.   ---- 09/02/2010 9:53 AM, Shelbie Proctor wrote: Dr Debby Bud pt has a  cardiac pacemaker Medtronic device  per [facility]  pt can not be scheduled for this MRI Pls advise   ---- 09/02/2010 3:58 AM, [name] MD wrote: The following orders have been entered for this patient and placed on Admin Hold:  Type:     Referral       Code:   Radiology Description:   Radiology Referral Order Date:   09/01/2010   Authorized By:   [name] MD Order #:   [id number] Clinical Notes:   Name of Test or Procedure: MRI L-S spine without contrast for DDD with persistent right sided sciatica. rule out surgical disease.  Of What:  Special Instructions ------------------------------

## 2010-09-11 NOTE — Progress Notes (Signed)
Summary: Release inquiry  Phone Note Call from Patient Call back at Home Phone [phone]   Summary of Call: Pt would like to know if he could get Note releasing him to go back to work?.He states he is felling better and also asks if he will need another OV before releasing him?.Please advise. Initial call taken by: Burnard Leigh [facility]),  September 04, 2010 10:38 AM  Follow-up for Phone Call        mAY HAVE A RETURN TO WORK NOTE. Follow-up by: [name] MD,  September 04, 2010 11:32 AM  Additional Follow-up for Phone Call Additional follow up Details #1::        Done, Pt informed  Additional Follow-up by: [name], CMA,  September 04, 2010 4:04 PM
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
## 2010-09-11 NOTE — Progress Notes (Signed)
Summary: RESULTS  Phone Note Outgoing Call   Reason for Call: Discuss lab or test results Summary of Call: x-ray reveal disk disease in the lumbar spine.Have ordered an MRI L-S spine. Thanks Initial call taken by: [name] MD,  September 02, 2010 3:59 AM  Follow-up for Phone Call        Pt informed  Follow-up by: [name], CMA,  September 02, 2010 5:18 PM

## 2010-10-15 ENCOUNTER — Other Ambulatory Visit: Payer: Self-pay | Admitting: Internal Medicine

## 2010-11-06 ENCOUNTER — Encounter: Payer: Self-pay | Admitting: *Deleted

## 2010-11-09 ENCOUNTER — Encounter: Payer: Self-pay | Admitting: *Deleted

## 2010-11-17 ENCOUNTER — Ambulatory Visit (INDEPENDENT_AMBULATORY_CARE_PROVIDER_SITE_OTHER): Payer: 59 | Admitting: *Deleted

## 2010-11-17 ENCOUNTER — Other Ambulatory Visit: Payer: Self-pay

## 2010-11-17 DIAGNOSIS — I495 Sick sinus syndrome: Secondary | ICD-10-CM

## 2010-11-17 NOTE — Progress Notes (Signed)
Pacer remote check  

## 2010-11-18 NOTE — Assessment & Plan Note (Signed)
[facility]                         ELECTROPHYSIOLOGY OFFICE NOTE   Michael Snyder, Michael Snyder                        MRN:          [id number]  DATE:10/28/2007                            DOB:          [birth year]    Michael Snyder developed a DVT following pacemaker implantation about a year  ago.  He was started on Coumadin and comes in today hoping that his  Coumadin can be discontinued.  His DVT is presumably resolved with no  further swelling in his arm.   He has no complaints of chest pain or shortness of breath.   His medications currently include glipizide.   His blood pressure was 148/92; his pulse was 83.  LUNGS:  Clear.  HEART:  Sounds were regular.  EXTREMITIES:  Without edema seen in the left arm.  This was important   Electrocardiogram today demonstrated sinus rhythm at 83 with intervals  of 0.30/0.08/0.34.   Interrogation of his Medtronic adaptive pulse generator demonstrated  impedances of 4040 - A/520/V.  The threshold were 0.5 volts and 0.5 msec  in the A, 0.5 volts at 0.4 in the A, 0.75 at 0.4 in the V with a P-wave  of 1 and an R-wave of greater than 15.   IMPRESSION:  1. Nocturnal and significant bradycardia.  2. Status post pacer for the above.  3. Deep venous thrombosis complicating #2 with previous treatment for      Coumadin now with resolution.  4. Diabetes.  5. Poorly controlled hypertension.   Michael Snyder is stable from an arrhythmia point of view, and his DVT has  resolved.  I am most concerned about his blood pressure in the context  of his diabetes, and I have asked that he follow up with Dr. Debby Bud  about this; I think it would probably be appropriate for him to be  initiated on ACE inhibitor therapy.   We will see him again in 3 months' time at which time remote followup  can be implemented.     Duke Salvia, MD, [facility]  Electronically Signed    SCK/MedQ  DD: 10/28/2007  DT: 10/28/2007  Job #: [id number]

## 2010-11-18 NOTE — Discharge Summary (Signed)
Michael Snyder, Michael Snyder NO.:  [id number]   MEDICAL RECORD NO.:  [id number]          PATIENT TYPE:  INP   LOCATION:  3702                         FACILITY:  [facility]   PHYSICIAN:  Madolyn Frieze. Jens Som, MD, FACCDATE OF BIRTH:  04-27-1951   DATE OF ADMISSION:  01/13/2007  DATE OF DISCHARGE:  01/15/2007                               DISCHARGE SUMMARY   PRIMARY CARDIOLOGIST:  [name], MD, [facility].   ELECTROPHYSIOLOGIST:  Duke Salvia, MD, [facility]   PRIMARY CARE PHYSICIAN:  Harrel Lemon. Merla Riches, M.D.   PROCEDURES PERFORMED DURING HOSPITALIZATIONS:  Placement of dual chamber  pacemaker implantation per Dr. Sherryl Manges on January 14, 2007.  This is a  Medtronic pacemaker [id number], with ventricular lead serial [id number] and  Medtronic 5076 52 cm atrial lead, serial [id number].   DISCHARGE DIAGNOSES:  1. Symptomatic bradycardia      a.     Profound nocturnal bradycardia.      b.     Baseline first degree atrioventricular block.  2. Diabetes.  3. Normal left ventricular function.  4. Hypertension.  5. History of hypocalcemia.  6. Recent exertional chest pain.   HISTORY OF PRESENT ILLNESS:  This is a 60 year old African American  male with no prior cardiac history who was recently hospitalized with  severe hyperglycemia and dehydration with renal insufficiency.  During  that admission, the patient was noted to have sinus bradycardia with a  rate in the 40s with 3 second pauses.  The patient was seen as an  outpatient by Dr. Antoine Poche.  Echocardiogram demonstrated central  hypertrophy and diastolic dysfunction, that his EF was 55-65%.  EKG  demonstrated first degree AV block.  The patient had a 48-hour Holter  monitor placed and while sleeping, it was noted that he had five 8-  second pauses and one 11-second pause.  The patient was asymptomatic and  was asleep during this time.  The patient was seen by Dr. Antoine Poche on  January 13, 2007 and discussion for pacemaker  implantation was had and the  patient agreed.   The patient was admitted on January 14, 2007 for pacemaker implantation by  Dr. Sherryl Manges with the above mentioned pacemaker insertion.  Please  see Dr. Odessa Fleming thorough operative report for further information.  The  patient recovered well without any complaints of pain.  He did have  soreness at the pacemaker site.  The patient did have a sling placed for  stabilization.  The patient's pacemaker was evaluated by Medtronic this  a.m. and found to be functioning appropriately.  The patient was very  anxious to be discharged this morning without any further complaints.   The patient was seen and examined by Dr. Olga Millers on day of  discharge, was found to be stable.  The patient will be scheduled for a  stress Myoview in 2-4 weeks post discharge.  His glipizide is going to  be decreased to 5 mg twice a day.  The patient to be followed up by Dr.  Shela Commons. Antoine Poche.  Primary care physician will be referred by Dr. Antoine Poche  when he  sees him back.  The patient will benefit from institution of  Statin drug long term given his diagnosis of diabetes that we will leave  to Dr. Antoine Poche for further evaluation at his discretion.  The patient  will also follow up with Dr. Sherryl Manges post pacemaker implantation in  3 months and be seen in the pacemaker clinic for reevaluation of  pacemaker in 2 weeks.   DISCHARGE MEDICATIONS:  1. Glipizide 10 mg b.i.d.  2. Aspirin 325 mg daily.   ALLERGIES:  No known drug allergies.   LABORATORIES ON DISCHARGE:  Sodium is 139, potassium 3.0 (repleted),  chloride 113, bicarb 23, glucose 99, BUN 11, creatinine 1.1.  Hemoglobin  10.8, hematocrit 31.6, white blood cells 4.5, platelets 204.  TSH is  found to be 2.795.   VITAL SIGNS:  Blood pressure 105/66, heart rate 67, respirations 18-20,  oxygen saturations 99% on room air.   FOLLOW UP PLANS AND APPOINTMENTS:  1. The patient will be seen in the pacemaker clinic  on Wednesday, July      30 at 9:40 a.m.  2. The patient will see Dr. Graciela Husbands on Wednesday, October 15, at 9:30      a.m.  3. The patient will follow up with Dr. Daiva Nakayama within 1 month for      continued evaluation and cardiac management.  He will need to be      scheduled for a stress Myoview at that time.  4. The patient will need to be referred to primary care physician for      continuation of medical management.  5. The patient has been given post pacemaker instructions with phone      number to call concerning any problems.   Time spent with the patient to include physician time 45 minutes.      Bettey Mare. Lyman Bishop, NP      Madolyn Frieze. Jens Som, MD, [facility]  Electronically Signed    KML/MEDQ  D:  01/15/2007  T:  01/15/2007  Job:  [id number]

## 2010-11-18 NOTE — Op Note (Signed)
NAME:  Michael Snyder, Michael Snyder               ACCOUNT NO.:  [id number]   MEDICAL RECORD NO.:  [id number]          PATIENT TYPE:  INP   LOCATION:  3702                         FACILITY:  [facility]   PHYSICIAN:  Duke Salvia, MD, FACCDATE OF BIRTH:  [birth year]   DATE OF PROCEDURE:  DATE OF DISCHARGE:                               OPERATIVE REPORT   PREOPERATIVE DIAGNOSIS:  Sinus node dysfunction with nocturnal pauses of  greater than 10 seconds.   POSTOPERATIVE DIAGNOSIS:  Sinus node dysfunction with nocturnal pauses  of greater than 10 seconds.   PROCEDURE:  Dual chamber pacemaker implantation.   Following the obtaining of informed consent, the patient was brought to  the electrophysiology laboratory and placed on the fluoroscopic table in  the supine position.  After routine prep and drape of the left upper  chest, lidocaine was infiltrated in the prepectoral subclavicular  region.  An incision was made and carried down to the layer of the  prepectoral fascia using electrocautery and sharp dissection.  A pocket  was formed similarly.  Hemostasis was obtained.   Thereafter, attention was turned to gain access to the extrathoracic  left subclavian vein which was accomplished with difficulty.  There is  no aspiration of air or puncture of the artery.  Because of numerous  sticks and during the contrast, parameters demonstrated rather cephalad  course of the vein.  This allowed for rapid cannulation of the vein.  Two separate venipunctures were accomplished.  Guide wires were placed  and retained and 0 silk suture was placed in a figure-of-eight fashion,  allowed to hang loosely.   Sequentially, 7-French sheaths were placed through which were passed a  Medtronic 5076, 58 cm  active fixation ventricular leads serial  #[id number] and a Medtronic 5076, 52 cm active fixation atrial lead  serial #[id number].  The ventricular lead was marked with a tie.   Under fluoroscopic guidance, the right  ventricular lead was manipulated  in the right ventricular septum where the bipolar R wave is 9.8 with a  pacing impendence of 704, threshold of 0.8 volts at 0.5 milliseconds.  Current threshold is 1.3 MA.  There was no diaphragmatic pacing at 10  volts and the current of injury was brisk.  The bipolar P wave was  manipulated to the right atrial appendage where the bipolar P wave was  2.3 with impedance at 1215 Ohms and threshold at 1.3 volts at 0.5  milliseconds.  The current of threshold to 1.4 MA.  There is no  diaphragmatic pacing at 10 volts and again the current of injury was  brisk.  These leads were secured to the prepectoral fascia and then  attached to a Medtronic Adapta AADRO1 pulse generator, serial  #[id number].  The ventricular pacing and AV pacing was identified.  The  pocket was copiously irrigated with antibiotic containing saline  solution.  Hemostasis was assured and the leads and pulse generator were  placed in the pocket and secured to the prepectoral fascia.  The wound  was closed in three layers in the normal fashion.  The wound was washed,  dried  and a benzoin and Steri-Strip dressing was applied.   Needle count, sponge count and instrument counts were correct at the end  of the procedure according to the staff.  The patient tolerated the  procedure without apparent complication.      Duke Salvia, MD, [facility]  Electronically Signed     SCK/MEDQ  D:  01/14/2007  T:  01/14/2007  Job:  [id number]   cc:   Dr. __________  Pacemaker Clinic

## 2010-11-18 NOTE — H&P (Signed)
NAME:  Michael Snyder, Michael Snyder NO.:  [id number]   MEDICAL RECORD NO.:  [id number]          PATIENT TYPE:  INP   LOCATION:  3702                         FACILITY:  [facility]   PHYSICIAN:  [name], MD, FACCDATE OF BIRTH:  1950/11/17   DATE OF ADMISSION:  01/13/2007  DATE OF DISCHARGE:                              HISTORY & PHYSICAL   PRIMARY CARE PHYSICIAN:  Ellamae Sia, M.D.   REASON FOR PRESENTATION:  A patient with asystole.   HISTORY OF PRESENT ILLNESS:  The patient is a pleasant 60 year old  gentleman without prior cardiac history.  He was recently hospitalized  with severe hyperglycemia and dehydration with renal insufficiency.  During that admission, he was noted to have sinus bradycardia with rates  in the 40s and occasional 3-second pauses.  I saw him as an outpatient.  An echocardiogram demonstrated some mild septal hypertrophy, some mild  diastolic dysfunction, and an EF of 55 to 65%.  His EKG demonstrates  first-degree AV block but no other significant changes.   Patient is quite active.  He says he does his routine activities without  any chest discomfort, neck or arm discomfort.  He has no palpitations,  presyncope, or syncope.  He has no PND or orthopnea.   I did place a 48-hour Holter Monitor.  The patient had this taken off  and read today.  While sleeping, he had several 5 to 8-second pauses.  He had one 11-second pause.  Again, he was asleep and did not have any  symptoms related to this.  He was called to come in today to discuss  pacemaker placement.   PAST MEDICAL HISTORY:  1. Diabetes mellitus, newly diagnosed.  2. Hypertension, not requiring medications.   PAST SURGICAL HISTORY:  A left shoulder surgery.   ALLERGIES:  NONE.   CURRENT MEDICATIONS:  Glipizide 10 mg b.i.d.   SOCIAL HISTORY:  The patient lives in [place] with his wife.  He has  4 children and 8 grandchildren.  He does not smoke cigarettes and does  not drink  alcohol.  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   FAMILY HISTORY:  Questionable for his mother having congestive heart  failure at age 67.  There is no history of sudden death, early coronary  artery disease, syncope.   REVIEW OF SYSTEMS:  Negative through all systems.  In particular, he  denies any snoring, daytime somnolence.  His wife is a Engineer, civil (consulting) and has not  noted any apnea.   PHYSICAL EXAM:  GENERAL:  The patient is well appearing.  He is  pleasant.  He is thin.  VITAL SIGNS:  Blood pressure 130/80, heart rate 73 and regular,  afebrile, 97% saturation on room air.  HEENT:  Eyes are unremarkable, pupils equal, round, react to light,  fundi not visualized.  NECK:  No jugular venous distention, carotid bruits, carotid upstroke,  brisk and symmetrical.  No bruits.  Without thyromegaly.  LYMPHATICS:  No cervical, axillary, inguinal adenopathy.  LUNGS:  Clear to auscultation bilaterally.  BACK:  Vertebral angle tenderness.  CHEST:  Unremarkable.  HEART:  PMI not  displaced or sustained, S1 and S2 within normal limits,  no S3, no S4, no clicks, no rubs, no murmurs.  ABDOMEN:  Slightly distended, positive bowel sounds, normal frequency  and pitch.  No bruits.  No rebound, no guarding, no mass, no  hepatomegaly, no splenomegaly.  SKIN:  No rashes, no nodules.  EXTREMITIES:  2+ pulses throughout, no edema, cyanosis, no clubbing.  NEURO:  Cranial nerves II through XII grossly intact, neurovascularly  intact.   EKG (today's EKG is pending) December 26, 2006 sinus rhythm, axis within  normal limits, first-degree AV block, poor anterior R-wave progression,  no acute ST or T-wave changes.   Labs pending.   ASSESSMENT AND PLAN:  1. Bradycardia.  The patient had a profound sinus pause.  Albeit this      was while he was asleep.  It was 11 seconds.  He has no symptoms      consistent with sleep apnea.  I believe the patient may have a      permanent pacemaker.  His wife brings  him--electrophysiology      consult.  I did discuss this with Dr. Graciela Husbands.  He would like to get      a continuous pulse oximeter.  We will then make a decision tomorrow      about permanent pacing.  2. Diabetes.  The patient's blood sugars have been falling, per his      wife.  I am going to reduce his glipizide to 5 mg b.i.d. and check      Accu-Cheks.  He will have no glipizide given while he is in NPO for      any procedures.  3. Hypertension.  Monitor his blood pressure.      [name], MD, [facility]  Electronically Signed     JH/MEDQ  D:  01/13/2007  T:  01/14/2007  Job:  [id number]   cc:   Harrel Lemon. Merla Riches, M.D.

## 2010-11-18 NOTE — Assessment & Plan Note (Signed)
[facility]                         ELECTROPHYSIOLOGY OFFICE NOTE   BELDON, NOWLING                        MRN:          [id number]  DATE:02/23/2007                            DOB:          01/30/1951    [name] is seen following a device implantation.  We were concerned a  couple of weeks ago because of left arm swelling that there may be a  DVT.  We took him to the lab and shot a venogram and there was stenosis  but not obstruction.  Unfortunately, he developed progressive swelling  over his left upper extremity with now extension down into his hands.   I think presumptively there is clot there and certainly he is at risk  for a PE.  We will plan to begin him on Coumadin.  I have discussed with  him the potential risks.  We will plan this for 3-6 months.  I have  advised him to maintain elevation of his arm in the interim.     Duke Salvia, MD, [facility]  Electronically Signed    SCK/MedQ  DD: 02/23/2007  DT: 02/24/2007  Job #: [id number]

## 2010-11-18 NOTE — Op Note (Signed)
NAMETREASURE, INGRUM NO.:  [id number]   MEDICAL RECORD NO.:  [id number]          PATIENT TYPE:  OIB   LOCATION:  2899                         FACILITY:  [facility]   PHYSICIAN:  Duke Salvia, MD, FACCDATE OF BIRTH:  [birth year]   DATE OF PROCEDURE:  02/02/2007  DATE OF DISCHARGE:  02/02/2007                               OPERATIVE REPORT   PREOPERATIVE DIAGNOSIS:  Left upper arm swelling following pacemaker  implantation.   POSTOPERATIVE DIAGNOSIS:  Patent subclavian vein.   PROCEDURE:  Contrast venogram.   The patient was prepared for venography.  20 mL intravenous contrast was  injected via left antecubital vein with a flush of 20 mL.  Narrowing at  the site of the lead insertion was noted but there was brisk flow cross  the subclavian into the superior vena cava.   The patient tolerated the procedure without apparent complication.      Duke Salvia, MD, [facility]  Electronically Signed     SCK/MEDQ  D:  02/02/2007  T:  02/03/2007  Job:  [id number]

## 2010-11-18 NOTE — H&P (Signed)
Michael Snyder, PIETRZAK NO.:  [id number]   MEDICAL RECORD NO.:  [id number]          PATIENT TYPE:  INP   LOCATION:  2918                         FACILITY:  [facility]   PHYSICIAN:  Pearlean Brownie, M.D.DATE OF BIRTH:  December 26, 1950   DATE OF ADMISSION:  12/26/2006  DATE OF DISCHARGE:                              HISTORY & PHYSICAL   CHIEF COMPLAINT:  Sent from home after abnormal lab values from [facility].   HISTORY OF PRESENT ILLNESS:  This is a 60 year old male who is here with  elevated blood glucose.  He states he has been feeling weak and sleepy  since Wednesday.  He had blurry vision on Wednesday as well.  He notices  increased thirst and increased drinking of fluid.  He also notes his  increased urine output since Thursday.  He complains of chest pain that  is dull on the left side.  This started prior to him feeling weak.  There is no diuresis with the chest pain.  The pain is worse with eating  greasy foods.  The patient went to Urgent Care on Friday for this  feeling of weakness and labs were drawn that day.  Those results were  called in this morning and the patient was noted to have an elevated  glucose of 1260 as well as potassium 2.7 on that blood draw.  The  patient was called and told to come to the emergency room for admission.   PAST MEDICAL HISTORY:  Hypertension.   MEDICATIONS:  Hydrochlorothiazide/atenolol 25/50.   ALLERGIES:  No known drug allergies.   PAST SURGICAL HISTORY:  Left shoulder surgery with plates, otherwise no  surgeries.   FAMILY HISTORY:  He has a second cousin with diabetes.  He does not know  his father's history and is trying to find out if he had diabetes.   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  He denies  alcohol use.  Denies tobacco use.  Denies drug use.   REVIEW OF SYSTEMS:  See HPI as well as denies nausea, vomiting.  Denies  diarrhea.  Denies constipation.  Denies other symptoms.    PHYSICAL EXAMINATION:  VITAL SIGNS:  Temperature is 96.9, heart rate 61,  blood pressure 93/56, respiratory rate 12.  GENERAL:  Not in acute distress.  He is slow and sleepy, but alert.  HEENT:  Pupils equal, round, react to light and accommodation,  extraocular muscles intact.  There is faint thrush on his tongue,  negative erythema.  Nares are patent.  NECK:  Negative lymphadenopathy.  Negative thyromegaly.  CARDIOVASCULAR:  Regular rate and rhythm.  No scalp murmurs.  PULMONOLOGY:  Clear to auscultation bilaterally.  ABDOMEN:  Soft, nontender, positive bowel sounds.  SKIN:  No tenting, but skin is slow to return to baseline after  pinching.  There are no rashes.  EXTREMITIES:  3+ second capillary refill, 2+ pulses, no edema.  MUSCULOSKELETAL:  5/5 strength in upper and lower extremities.  NEUROLOGIC:  Cranial 2-12 intact.  The patient is alert and oriented x3.  No focal deficits.  Cerebellar function  is intact.   LABORATORY DATA AND X-RAY FINDINGS:  Labs from today with I-stat showing  a sodium 120, potassium 3, chloride 83 with a bicarb of 30, BUN 40,  creatinine 3.4, glucose of greater than 700  On BMET sodium 121,  potassium 3, chloride 76, bicarb 28, BUN 46, creatinine 3.39, glucose of  708.  CO2 40.3, bicarb 29, anion gap of 7.  Urinalysis with specific  gravity of 1.026, glucose greater than 1000, otherwise within normal  limits.  Labs from June 20, at Urgent Care, showed a sodium of 118,  corrected is 137, potassium 2.7, chloride 71, bicarb 23, BUN 28,  creatinine 2.65 with a glucose of 1260.  Phosphorus was elevated at that  time at 7.3.  LFTs within normal limits.  Total bilirubin was 1.3.   ASSESSMENT/PLAN:  This is a 60 year old gentleman who was admitted for  hyperosmolar, hyperglycemic, nonketotic state.   1. Hyperosmolar, hyperglycemic, nonketotic stay, severe, with labs      that resemble diabetic ketoacidosis picture to an extent.  Given      the low potassium, we  cannot start insulin at this point.  Instead,      we will give intravenous boluses with 30 mEq of potassium chloride.      Once potassium is greater than or equal to 3.3, we are going to      start insulin drip at 0.1 unit/kg bolus and then we are going to do      0.1 unit/kg per hour insulin.  If this does not decrease by 51      hours, we are going to double the insulin dose.  Once the patient's      glucose is 250, we will decrease insulin to 0.1 unit/kg per hour      and start Lantus about 20-25 units and them keep the intravenous      insulin for 1 hour until stopping that.  Once his blood glucose is      200, we are going to change intravenous fluids to D-5 one half      normal saline.  We are going to do sliding scale insulin and q.4 h.      CBGs at this time.  We are going to follow 2 hour BMETs until labs      become normal.  We need to make sure the patient is tolerating oral      fluids, once the CBG is 200 prior to switching to Lantus.  The      patient will require careful monitoring and aggressive intravenous      fluid hydration.  Normal anion gap currently.  2. Hypokalemia, see above.  We will further decrease potassium, so we      will need to normalize potassium with KCl and intravenous fluids      prior to starting insulin with q.2 h. BMETs.  3. Hypertension.  Will continue home medications when stable.  4. Fluids, electrolytes and nutrition, n.p.o. with above intravenous      fluids for now and then carbohydrate modified diet.  5. Chest pain, atypical in nature.  However, given the patient's      description, we will rule him out with cardiac enzymes q.8 h. x3.      Draw point of care markers.  We are also going to check an EKG.      Will follow these and add on any medications as necessary.      Otherwise, we  are going to start aspirin.  If the patient has EKG     changes and/or positive enzymes, we will go the unstable angina      pathway and add on medications as  needed.  6. Acute renal failure.  Likely secondary to hypovolemia in a prerenal      picture.  We are going to aggressively hydrate the gentleman and      follow his creatinine.  We expect that his creatinine should      normalize once the patient is better hydrated.  Hold all      nephrotoxic medications such as ACE inhibitors.   DISPOSITION:  The patient will need medical stability as well as  diabetes medication prior to going home.  He has not seen his primary  care physician in over a year which is Dr. Benedetto Goad in [place].  He likely can go back to [facility], if needed.     ______________________________  Johney Maine, M.D.    ______________________________  Pearlean Brownie, M.D.    JT/MEDQ  D:  12/26/2006  T:  12/27/2006  Job:  [id number]

## 2010-11-18 NOTE — Assessment & Plan Note (Signed)
[facility]                         ELECTROPHYSIOLOGY OFFICE NOTE   CHRISTIANJAMES, SOULE                        MRN:          [id number]  DATE:02/02/2007                            DOB:          04-Aug-1950    Mr. Vanover was seen today in the clinic on February 02, 2007, for a wound  check of his newly-implanted Medtronic model number [id number] Adapta.  Date  of implant was January 14, 2007, for bradycardia.  On interrogation of his  device today, his battery voltage is 2.78.  P waves measured 1.0-1.4 mV  with an atrial capture threshold of 0.25 V at 0.4 msec and an atrial  lead impedance of 429 Ohms.  R waves measured 15.68-22.40 mV with a  ventricular pacing threshold of 0.5 V at 0.4 msec and a ventricular lead  impedance of 502 Ohms.  There were 69 mode switches noted totaling less  than 0.1% of the time.  His underlying rhythm today was a sinus rhythm  at 84 beats per minute.  Capture adapt is programmed on in both the A  and V.  No changes were made in his parameters.  He is ventricularly  pacing about 0.6% of the time.  His Steri-Strips are removed.  His wound  is without redness or edema but his left arm was edematous today.  I  spoke with Dr. Ladona Ridgel, who is in the office, and he was going to have a  Doppler done today and we will continue from there.  Otherwise, if the  scan is negative he will see Dr. Graciela Husbands back in October.      Altha Harm, LPN  Electronically Signed      Duke Salvia, MD, [facility]  Electronically Signed   PO/MedQ  DD: 02/02/2007  DT: 02/03/2007  Job #: [id number]

## 2010-11-18 NOTE — Assessment & Plan Note (Signed)
[facility]                            CARDIOLOGY OFFICE NOTE   Michael Snyder, Michael Snyder                        MRN:          [id number]  DATE:12/31/2006                            DOB:          April 28, 1951    REFERRING PHYSICIAN:  Harrel Lemon. Merla Riches, M.D.   REASON FOR REFERRAL:  Evaluate patient with bradycardia and newly-  diagnosed diabetes.   HISTORY OF PRESENT ILLNESS:  Patient is a pleasant, 60 year old African-  American gentleman without prior cardiac history.  He was recently found  to have diabetes, when he presented with a blood sugar of greater than  1200.  He was hospitalized actually only for two days, despite acute  renal insufficiency, dehydration.  He was noted, in the hospital, to  have bradycardia with a couple of three-second pauses and resting  bradycardic rhythms in the 40s, though he did not have any symptoms with  this.  He also had some chest discomfort, though he does not recall  this.  He had negative cardiac enzymes.  He did have an echocardiogram,  which demonstrated no abnormalities with normal ejection fraction of 55-  65%.  He had some mild focal basal septal hypertrophy.  There appeared  to be some parameters consistent with diastolic dysfunction.   The patient says that he is actually quite active.  He does not exercise  routinely, but he does work a couple of jobs.  He denies any chest  discomfort, neck discomfort, arm discomfort, activity-induced nausea or  vomiting, excessive diaphoresis.  He has had no palpitations, presyncope  or syncope.  He says he has some feeling of slow heartbeat, but he has  had this all his life.   PAST MEDICAL HISTORY:  Newly-diagnosed diabetes.   PAST SURGICAL HISTORY:  Shoulder replacement.   ALLERGIES:  None.   MEDICATIONS:  Glipizide 10 mg b.i.d.   SOCIAL HISTORY:  The patient is married and has four children and eight  grandchildren.  He has never smoked cigarettes.  He does not  drink  alcohol.  There was previous substance use, many years ago.   FAMILY HISTORY:  Noncontributory for early coronary disease, though his  mother died of natural causes at age 30.  She may have had heart  failure by the description.   REVIEW OF SYSTEMS:  Positive for glasses, fleeting occasional chest  discomforts, vaguely described.  Negative for other systems.   PHYSICAL EXAMINATION:  The patient is in no distress.  He is pleasant.  Blood pressure 109/77, heart rate 72 and regular, weight 180 pounds,  body mass index 24.  HEENT:  Eyelids unremarkable.  Pupils equal, round and reactive to  light.  Fundi within normal limits.  Oral mucosa unremarkable.  NECK:  No jugular venous distention at 45 degrees.  Carotid upstroke  brisk and symmetric.  No bruits, no thyromegaly.  LYMPHATICS:  No cervical, axillary or inguinal adenopathy.  LUNGS:  Clear to auscultation bilaterally.  BACK:  No costovertebral angle tenderness.  CHEST:  Unremarkable.  HEART:  PMI not displaced or sustained.  S1 and S2 within normal limits.  No S3, no S4, no clicks, no rubs, no murmurs.  ABDOMEN:  Flat, positive bowel sounds, normal in frequency and pitch.  No bruits, no rebound, no guarding, no midline pulsatile mass, no  hepatomegaly, no splenomegaly.  SKIN:  No rashes, no nodules.  EXTREMITIES:  Two-plus pulses throughout, no edema, no cyanosis, no  clubbing.  NEUROLOGIC:  Oriented to person, place and time.  Cranial nerves II  through XII grossly intact.  Motor grossly intact.   EKG:  Sinus rhythm, rate 77, axis within normal limits, first degree AV  block, no acute STT-wave changes.   ASSESSMENT AND PLAN:  1. Bradycardia:  The patient had what sounds like resting bradycardia,      but with three-second pauses in the hospital.  I do not suspect any      significant pathology, though he does have some mild conduction      abnormality on his EKG.  I am going to get a 24-hour Holter      monitor.   Unless I find any profound bradyarrhythmias, no further      evaluation will be warranted.  2. Chest discomfort:  The patient had some chest discomfort in the      hospital.  He has significant cardiovascular risk factors.      Therefore, he will come back for an exercise treadmill test.  This      will also allow [place] to give him a prescription for exercise.  3. Risk reduction:  Patient did not have a lipid profile that I can      see in the hospital.  We will get one when he comes back, fasting,      for his treadmill test.     [name], MD, [facility]  Electronically Signed    JH/MedQ  DD: 12/31/2006  DT: 12/31/2006  Job #: [id number]

## 2010-11-18 NOTE — Op Note (Signed)
NAME:  Michael Snyder, Michael Snyder               ACCOUNT NO.:  [id number]   MEDICAL RECORD NO.:  [id number]          PATIENT TYPE:  AMB   LOCATION:  ENDO                         FACILITY:  [facility]   PHYSICIAN:  Anselmo Rod, M.D.  DATE OF BIRTH:  April 10, 1951   DATE OF PROCEDURE:  08/08/2007  DATE OF DISCHARGE:  08/08/2007                               OPERATIVE REPORT   PROCEDURE PERFORMED:  Screening colonoscopy.   ENDOSCOPIST:  Anselmo Rod, M.D.   INSTRUMENT USED:  Pentax video colonoscope.   INDICATIONS:  This is a 60 year old [place] American male with a history  of diabetes and a pacemaker since the summer of 2008.  Rule out colonic  polyps, masses, etc.   PREPROCEDURE PREPARATION:  Informed consent was procured from the  patient.  The patient fasted for 4 hours prior to the procedure and  prepped with MoviPrep the night for the morning of the procedure.  The  risks and benefits of the procedure including a 10% miss rate of cancer  and polyps were discussed with the patient as well.  His Coumadin was  held for 5 days prior to procedure, after procuring consent to do so  from Dr. Sherryl Manges.   PREPROCEDURE PHYSICAL:  The patient had stable vital signs.  NECK:  Supple.  CHEST:  Clear to auscultation.  S1 and S2 regular.  Pacemaker present on  the chest subcutaneously.  ABDOMEN:  Soft with normal bowel sounds.   DESCRIPTION OF PROCEDURE:  The patient was placed in the left lateral  decubitus position and sedated with 75 mcg of Fentanyl and 8 mg of  Versed given intravenously in slow incremental doses.  Once the patient  was adequately sedated and maintained on low-flow oxygen and continuous  cardiac monitoring, the Pentax video colonoscope was advanced from the  rectum to the cecum.  Multiple washes were done.  The appendiceal  orifice and cecal valve were clearly visualized and photographed.  Small  internal hemorrhoids were seen on retroflexion.  No masses, polyps,  erosions,  ulcerations or diverticula were identified.  Small lesions  could be missed.  The patient tolerated the procedure well without  immediate complications.   IMPRESSION:  1. Significant amount of residual stool in the colon.  Multiple washes      were done.  Small lesions could be missed.  2. No masses, polyps, erosions, ulcerations or diverticula noted.  3. Small internal hemorrhoids seen on retroflexion.   RECOMMENDATIONS:  1. Restart Coumadin today.  2. Repeat colonoscopy in the next 5 years with a NuLytely prep.  3. If the colonoscopy next time is normal, the patient can then be      done every 20 years thereafter,  unless he has any abnormal      symptoms in the interim.  4. Outpatient follow-up as need arises in the future.      Anselmo Rod, M.D.  Electronically Signed     JNM/MEDQ  D:  08/10/2007  T:  08/10/2007  Job:  [id number]   cc:   Shade Flood, M.D.  Fax: [phone]

## 2010-11-18 NOTE — Discharge Summary (Signed)
Michael Snyder, ATALLAH NO.:  [id number]   MEDICAL RECORD NO.:  [id number]          PATIENT TYPE:  INP   LOCATION:  0454                         FACILITY:  [facility]   PHYSICIAN:  Melina Fiddler, MD DATE OF BIRTH:  Sep 16, 1950   DATE OF ADMISSION:  12/26/2006  DATE OF DISCHARGE:  12/28/2006                               DISCHARGE SUMMARY   PRIMARY CARE PHYSICIAN:  Dr. [facility]la [facility]hes of [facility].   DISCHARGE DIAGNOSES:  1. New onset type 2 diabetes mellitus.  2. Hypokalemia.  3. Acute renal failure.  4. Dehydration.  5. Hyperglycemia.  6. Atypical chest pain.  7. Asymptomatic bradycardia with 3-second pauses.  8. First degree AV block.  9. History of hypertension.   CONSULTATIONS:  None.   PROCEDURE:  A two-dimensional echocardiogram was performed on December 28, 2006, with results pending at the time of discharge.  This will need to  be followed up by his primary care physician.   DISCHARGE MEDICATIONS:  1. Glucotrol 10 mg p.o. b.i.d.  2. Aspirin 325 mg p.o. daily.   DISCHARGE INSTRUCTIONS:  The patient is to check his blood sugar every  morning before eating and 2 hours after meals.  The patient is to stop  taking hydrochlorothiazide, atenolol, lisinopril, and Metformin until  further instructed by his primary care physician.   LABORATORY DATA:  Admission labs included sodium 121, potassium 3.0,  chloride 76, bicarbonate 28, glucose 708, BUN 46, creatinine 3.39,  calcium 9.1.  Urinalysis showed greater than 1000 glucose and was  otherwise negative.  Three sets of cardiac enzymes were negative.  Discharge laboratories with a sodium of 136, potassium 3.4, chloride  106, bicarbonate 21, glucose 89, BUN 26, creatinine 1.90, and calcium  8.9, white blood cell count 7.5, hemoglobin 12.8, hematocrit 37.4, and  platelet count of 138.  Hemoglobin A1C of 13.8, TSH 2.795.   HOSPITAL COURSE:  Mr. Crehan is a 60 year old African-American male  who  is a patient at [facility] who presented to  the [facility] ER as a direct admit for elevated blood glucose.  He had been feeling weak and sleepy since last Wednesday with blurry  vision, increased thirst, and increased urine output.  In addition, he  feels weak.  For these symptoms, the patient presented to Urgent Care on  Friday and labs drawn that day were reported on the day of admission to  be glucose 1260 and potassium 2.7.  At that time, the patient was called  and told to report to the emergency room for admission.   Problem 1.  Hyperglycemia with severe dehydration.  The patient was  severely hyperglycemic on admission with a glucose of 708, however, had  normal bicarb, and an anion gap less than 300, therefore did not meet  the criteria for DKA or hyperosmolar hyperglycemic nonketotic state.  However, he was severely dehydrated with severe electrolyte derangement.  The patient was initially given several IV fluid boluses with 30 mEq of  potassium chloride and then as the patient had a very low  potassium of  2.7 on admission and insulin could not be given right away to lower  blood sugar as this would decrease the potassium further.  After  aggressive hydration with IV fluids the patient's blood glucose had  decreased to the 300's and it was determined that he would not need an  insulin drip.  Given the patient's dramatic response to IV fluids with  potassium, it was determined that he would not likely need insulin to  control his new onset diabetes mellitus.  He was started on sensitive  sliding scale insulin and Glucotrol 10 mg p.o. b.i.d.  The patient's  response to fluids and medication was dramatic.  The patient had blood  glucoses ranging from the 80s to 120s on his medication the remainder of  the hospital stay.   Problem 2.  New onset type 2 diabetes mellitus.  The patient has a  hemoglobin A1C of 13.9 and the patient has an  excellent response to  Glucotrol 10 mg p.o. b.i.d. with blood glucoses ranging from the 80s to  120s for several days prior to discharge.  The patient will be  discharged on this regimen.  If the future if the patient's CBG's are no  longer adequately controlled on this regimen, I would consider adding  Metformin at that time.  The patient received extensive diabetic  counseling and education while in the hospital and received a  prescription for a free Glucometer and testing strips and was instructed  to check his blood sugar before eating and 2 hours after meals.  He was  also educated on diabetic diet.   Problem 3.  Hypokalemia.  The patient's initial potassium was 3.0.  The  patient was aggressively hydrated with fluid boluses containing 30 mEq  per Liter of potassium, q.2 hour BMET's were checked, and within 10  hours of admission the patient's potassium had corrected to within  normal limits at 3.6.  At the time of discharge, the patient's potassium  was slightly low at 3.4 and he was given 40 mEq p.o. x1 prior to  discharge.  This can be followed up in the outpatient setting.   Problem 4.  Acute renal failure.  The patient presented with a  creatinine of 3.39.  This was thought to be prerenal secondary to severe  dehydration accompanying severe hyperglycemia.  The patient was hydrated  aggressively and at the time of admission, his creatinine had decreased  to 1.90.  It is anticipated that his kidneys will make a full recovery  and the patient's creatinine can be followed up as an outpatient.   Problem 5.  Atypical chest pain.  Upon admission, the patient described  dull left-sided chest pain that was exacerbated by eating greasy foods.  This was neither brought on by exertion or relieved by rest and sounded  atypical.  However, given the patient's diabetes and hypertension, he was ruled out for MI with an EKG that showed no ST elevation or  depression and three sets of negative  cardiac enzymes.  However, given  his risk factors, we recommend that the patient undergo outpatient  ischemic cardiac workup within the next month to evaluate for coronary  artery disease.   Problem 6.  Asymptomatic bradycardia with 3-second pauses and first  degree AV block.  During the hospital stay, the patient had several  episodes of bradycardia to the 20's, 30's, and 50's, always while  sleeping, always asymptomatic with stable blood pressure and mental  status.  The  patient did have first degree AV block on EKG.  This  bradycardia along with the patient's risk factors of diabetes and  history of hypertension, age, and gender, we recommend outpatient  ischemic cardiac workup within one month of discharge in the outpatient  setting.  We also obtained a two-dimensional echocardiogram during the  hospital stay and this was performed on the day of discharge and was not  read at the time of discharge.  This will need to be followed up by the  primary care physician.   Problem 7.  History of hypertension.  The patient presents with a  history of hypertension, however, during the hospital stay had blood  pressures ranging from upper 90's to 110's over 40's to 50's, and  therefore this in  combination with his bradycardia, the patient's hydrochlorothiazide,  atenolol, and lisinopril were held.  Need for antihypertensives can be  reevaluated in the outpatient setting.   FOLLOWUP:  The patient is to follow up with Dr. Merla Riches at [facility] in the next several days.     ______________________________  Levander Campion, M.D.    ______________________________  Melina Fiddler, MD    JH/MEDQ  D:  12/29/2006  T:  12/29/2006  Job:  [id number]   cc:   Dr. Erlene Senters [facility]

## 2010-11-18 NOTE — Letter (Signed)
May 20, 2007    Silas Sacramento, M.D.  [address]  [place], [place] [address]   RE:  JEMERY, STACEY  MRN:  [id number]  /  DOB:  1950/08/30   Dear Dr. Neva Seat:   I look forward to meeting you.  Mr. Haden came in today for his  pacemaker checkup implanted about 4 months ago for significant  bradycardia with nocturnal pauses of greater than 10 seconds.  The  pacemaker is functioning normally.   He also brings in a D.O.T. report, which apparently you are working on.  I have spoken to your colleague, Dr. Patsy Lager, earlier today.   He has no specific complaints.   MEDICATIONS:  Glipizide b.i.d.  Coumadin.   EXAMINATION:  Blood pressure was 136/72, pulse is 64.  His lungs were clear.  HEART:  Sounds were regular.  EXTREMITIES:  Without edema.   Interrogation of his Medtronic Adapta pulse generator demonstrates a P  wave of 2 with impedance of 524, threshold of 0.5 at 0.4.  The R wave is  11 with impedance of 524, threshold 0.625 at 0.4.  Battery voltage is  2.78.  He is atrially paced about 23% of the time.   IMPRESSION:  1. Sinus node dysfunction, nocturnal pausing.  2. Status post pacer for #1, which is functioning normally.  3. 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  4. Diabetes.   Dr. Neva Seat, Mr. Prosch is doing well from an arrhythmia point of view.  His pacemaker is functioning normally.   After speaking with Dr. Patsy Lager, we will plan to check a hemoglobin A1c  as well as a fasting lipid profile, and forward these results to you.   He is aware of his elevated liver function tests, albeit very minimally,  and he is aware also that he is to follow up with you next month about  this.   Thank you very much for asking [place] to follow him with you.    Sincerely,      Duke Salvia, MD, [facility]  Electronically Signed    SCK/MedQ  DD: 05/20/2007  DT:  05/20/2007  Job #: [id number]

## 2010-11-20 ENCOUNTER — Telehealth: Payer: Self-pay | Admitting: Internal Medicine

## 2010-11-20 NOTE — Telephone Encounter (Signed)
Pt wants to know if he transmitting correct. Pt has not heard anything re his transmitting.

## 2010-11-20 NOTE — Telephone Encounter (Signed)
Transmission received, patient aware. 

## 2010-12-11 ENCOUNTER — Encounter: Payer: Self-pay | Admitting: *Deleted

## 2010-12-26 ENCOUNTER — Emergency Department (HOSPITAL_COMMUNITY)
Admission: EM | Admit: 2010-12-26 | Discharge: 2010-12-26 | Disposition: A | Discharge status: Home / Self Care | Payer: 59 | Attending: Emergency Medicine | Admitting: Emergency Medicine

## 2010-12-26 ENCOUNTER — Ambulatory Visit (INDEPENDENT_AMBULATORY_CARE_PROVIDER_SITE_OTHER): Payer: 59 | Admitting: Internal Medicine

## 2010-12-26 ENCOUNTER — Encounter: Payer: Self-pay | Admitting: *Deleted

## 2010-12-26 ENCOUNTER — Telehealth: Payer: Self-pay | Admitting: *Deleted

## 2010-12-26 DIAGNOSIS — T1490XA Injury, unspecified, initial encounter: Secondary | ICD-10-CM

## 2010-12-26 DIAGNOSIS — I10 Essential (primary) hypertension: Secondary | ICD-10-CM | POA: Insufficient documentation

## 2010-12-26 DIAGNOSIS — T148XXA Other injury of unspecified body region, initial encounter: Secondary | ICD-10-CM

## 2010-12-26 DIAGNOSIS — M549 Dorsalgia, unspecified: Secondary | ICD-10-CM | POA: Insufficient documentation

## 2010-12-26 DIAGNOSIS — Y92410 Unspecified street and highway as the place of occurrence of the external cause: Secondary | ICD-10-CM | POA: Insufficient documentation

## 2010-12-26 DIAGNOSIS — E119 Type 2 diabetes mellitus without complications: Secondary | ICD-10-CM | POA: Insufficient documentation

## 2010-12-26 NOTE — Telephone Encounter (Signed)
abstracted 
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
## 2011-01-17 ENCOUNTER — Other Ambulatory Visit: Payer: Self-pay | Admitting: Internal Medicine

## 2011-02-19 ENCOUNTER — Other Ambulatory Visit: Payer: Self-pay | Admitting: Internal Medicine

## 2011-02-19 ENCOUNTER — Encounter: Payer: Self-pay | Admitting: Internal Medicine

## 2011-02-19 ENCOUNTER — Ambulatory Visit (INDEPENDENT_AMBULATORY_CARE_PROVIDER_SITE_OTHER): Payer: 59 | Admitting: *Deleted

## 2011-02-19 DIAGNOSIS — I495 Sick sinus syndrome: Secondary | ICD-10-CM

## 2011-02-20 LAB — REMOTE PACEMAKER DEVICE
AL AMPLITUDE: 0.7 mv
AL IMPEDENCE PM: 423 Ohm
ATRIAL PACING PM: 10
RV LEAD IMPEDENCE PM: 519 Ohm

## 2011-02-27 NOTE — Progress Notes (Signed)
Pacer remote check  

## 2011-03-04 ENCOUNTER — Encounter: Payer: Self-pay | Admitting: *Deleted

## 2011-04-21 ENCOUNTER — Other Ambulatory Visit: Payer: Self-pay | Admitting: Internal Medicine

## 2011-04-21 LAB — COMPREHENSIVE METABOLIC PANEL
ALT: 38
Alkaline Phosphatase: 49
CO2: 23
Chloride: 113 — ABNORMAL HIGH
GFR calc Af Amer: >60
GFR calc non Af Amer: >60
Total Bilirubin: 0.6
Total Protein: 6

## 2011-04-21 LAB — DIFFERENTIAL
Basophils Absolute: 0.1
Basophils Relative: 1
Neutrophils Relative %: 37 — ABNORMAL LOW

## 2011-04-21 LAB — CBC
HCT: 31.6 — ABNORMAL LOW
MCHC: 34.2
MCV: 84.2
Platelets: 204
RDW: 13.5
WBC: 4.5

## 2011-04-22 LAB — BASIC METABOLIC PANEL WITH GFR
BUN: 32 — ABNORMAL HIGH
BUN: 32 — ABNORMAL HIGH
BUN: 35 — ABNORMAL HIGH
BUN: 37 — ABNORMAL HIGH
BUN: 43 — ABNORMAL HIGH
BUN: 46 — ABNORMAL HIGH
CO2: 21
CO2: 24
CO2: 26
CO2: 26
CO2: 26
CO2: 26
Calcium: 8.1 — ABNORMAL LOW
Calcium: 8.3 — ABNORMAL LOW
Calcium: 8.5
Calcium: 8.6
Calcium: 8.7
Calcium: 8.8
Chloride: 101
Chloride: 102
Chloride: 79 — CL
Chloride: 92 — ABNORMAL LOW
Chloride: 99
Chloride: 99
Creatinine, Ser: 2.09 — ABNORMAL HIGH
Creatinine, Ser: 2.1 — ABNORMAL HIGH
Creatinine, Ser: 2.22 — ABNORMAL HIGH
Creatinine, Ser: 2.43 — ABNORMAL HIGH
Creatinine, Ser: 2.69 — ABNORMAL HIGH
Creatinine, Ser: 3.33 — ABNORMAL HIGH
GFR calc non Af Amer: 19 — ABNORMAL LOW
GFR calc non Af Amer: 25 — ABNORMAL LOW
GFR calc non Af Amer: 28 — ABNORMAL LOW
GFR calc non Af Amer: 31 — ABNORMAL LOW
GFR calc non Af Amer: 33 — ABNORMAL LOW
GFR calc non Af Amer: 33 — ABNORMAL LOW
Glucose, Bld: 135 — ABNORMAL HIGH
Glucose, Bld: 177 — ABNORMAL HIGH
Glucose, Bld: 216 — ABNORMAL HIGH
Glucose, Bld: 309 — ABNORMAL HIGH
Glucose, Bld: 444 — ABNORMAL HIGH
Glucose, Bld: 672
Potassium: 2.8 — ABNORMAL LOW
Potassium: 3.3 — ABNORMAL LOW
Potassium: 3.4 — ABNORMAL LOW
Potassium: 3.4 — ABNORMAL LOW
Potassium: 3.7
Potassium: 3.9
Sodium: 120 — ABNORMAL LOW
Sodium: 125 — ABNORMAL LOW
Sodium: 131 — ABNORMAL LOW
Sodium: 132 — ABNORMAL LOW
Sodium: 133 — ABNORMAL LOW
Sodium: 133 — ABNORMAL LOW

## 2011-04-22 LAB — BASIC METABOLIC PANEL
BUN: 40 — ABNORMAL HIGH
BUN: 41 — ABNORMAL HIGH
BUN: 46 — ABNORMAL HIGH
Calcium: 7.9 — ABNORMAL LOW
Chloride: 106
Chloride: 94 — ABNORMAL LOW
Creatinine, Ser: 2.48 — ABNORMAL HIGH
GFR calc Af Amer: 30 — ABNORMAL LOW
GFR calc Af Amer: 33 — ABNORMAL LOW
GFR calc Af Amer: 44 — ABNORMAL LOW
GFR calc non Af Amer: 19 — ABNORMAL LOW
GFR calc non Af Amer: 25 — ABNORMAL LOW
GFR calc non Af Amer: 37 — ABNORMAL LOW
Glucose, Bld: 708
Potassium: 3 — ABNORMAL LOW
Potassium: 3.4 — ABNORMAL LOW
Potassium: 3.6
Sodium: 121 — ABNORMAL LOW
Sodium: 126 — ABNORMAL LOW
Sodium: 136

## 2011-04-22 LAB — I-STAT 8, (EC8 V) (CONVERTED LAB)
Acid-Base Excess: 5 — ABNORMAL HIGH
BUN: 43 — ABNORMAL HIGH
Bicarbonate: 29 — ABNORMAL HIGH
Chloride: 83 — ABNORMAL LOW
Glucose, Bld: >700
HCT: 46
Hemoglobin: 15.6
Operator id: 270111
Potassium: 3 — ABNORMAL LOW
Sodium: 120 — ABNORMAL LOW
TCO2: 30
pCO2, Ven: 40.3 — ABNORMAL LOW
pH, Ven: 7.466 — ABNORMAL HIGH

## 2011-04-22 LAB — POCT I-STAT CREATININE
Creatinine, Ser: 3.4 — ABNORMAL HIGH
Operator id: 270111

## 2011-04-22 LAB — URINALYSIS, ROUTINE W REFLEX MICROSCOPIC
Bilirubin Urine: NEGATIVE
Glucose, UA: >1000 — AB
Hgb urine dipstick: NEGATIVE
Ketones, ur: NEGATIVE
Leukocytes, UA: NEGATIVE
Nitrite: NEGATIVE
Protein, ur: NEGATIVE
Specific Gravity, Urine: 1.026
Urobilinogen, UA: 0.2
pH: 5

## 2011-04-22 LAB — POCT CARDIAC MARKERS
CKMB, poc: 2.2
Myoglobin, poc: 276
Operator id: 151321
Troponin i, poc: <0.05

## 2011-04-22 LAB — CBC
HCT: 37.4 — ABNORMAL LOW
Hemoglobin: 12.8 — ABNORMAL LOW
Platelets: 138 — ABNORMAL LOW
RBC: 4.48
WBC: 7.5

## 2011-04-22 LAB — URINE MICROSCOPIC-ADD ON

## 2011-04-22 LAB — CARDIAC PANEL(CRET KIN+CKTOT+MB+TROPI)
CK, MB: 3.1
CK, MB: 3.1
CK, MB: 4.8 — ABNORMAL HIGH
Relative Index: 2.5
Relative Index: 2.5
Relative Index: 2.9 — ABNORMAL HIGH
Total CK: 125
Total CK: 166
Troponin I: 0.02

## 2011-04-22 LAB — TSH: TSH: 2.795

## 2011-04-22 LAB — CK TOTAL AND CKMB (NOT AT ARMC)
CK, MB: 3.7
Relative Index: 2.9 — ABNORMAL HIGH
Total CK: 127

## 2011-04-22 LAB — HEMOGLOBIN A1C: Hgb A1c MFr Bld: 13.8 — ABNORMAL HIGH

## 2011-04-22 LAB — TROPONIN I

## 2011-05-21 ENCOUNTER — Encounter: Payer: 59 | Admitting: *Deleted

## 2011-05-27 ENCOUNTER — Encounter: Payer: Self-pay | Admitting: *Deleted

## 2011-06-15 ENCOUNTER — Other Ambulatory Visit (INDEPENDENT_AMBULATORY_CARE_PROVIDER_SITE_OTHER): Payer: 59

## 2011-06-15 ENCOUNTER — Encounter: Payer: Self-pay | Admitting: Internal Medicine

## 2011-06-15 ENCOUNTER — Ambulatory Visit (INDEPENDENT_AMBULATORY_CARE_PROVIDER_SITE_OTHER): Payer: 59 | Admitting: Internal Medicine

## 2011-06-15 DIAGNOSIS — I10 Essential (primary) hypertension: Secondary | ICD-10-CM

## 2011-06-15 DIAGNOSIS — Z0000 Encounter for general adult medical examination without abnormal findings: Secondary | ICD-10-CM

## 2011-06-15 DIAGNOSIS — R569 Unspecified convulsions: Secondary | ICD-10-CM

## 2011-06-15 DIAGNOSIS — E119 Type 2 diabetes mellitus without complications: Secondary | ICD-10-CM

## 2011-06-15 DIAGNOSIS — M25519 Pain in unspecified shoulder: Secondary | ICD-10-CM

## 2011-06-15 LAB — CBC WITH DIFFERENTIAL/PLATELET
Basophils Relative: 0.2 % (ref 0.0–3.0)
Eosinophils Relative: 1.7 % (ref 0.0–5.0)
Lymphocytes Relative: 40.3 % (ref 12.0–46.0)
Neutrophils Relative %: 47.5 % (ref 43.0–77.0)
RBC: 4.07 Mil/uL — ABNORMAL LOW (ref 4.22–5.81)
WBC: 4.9 10*3/uL (ref 4.5–10.5)

## 2011-06-15 LAB — LIPID PANEL
HDL: 48.3 mg/dL (ref >39.00)
LDL Cholesterol: 88 mg/dL (ref 0–99)
Total CHOL/HDL Ratio: 3
VLDL: 21 mg/dL (ref 0.0–40.0)

## 2011-06-15 LAB — COMPREHENSIVE METABOLIC PANEL
AST: 37 U/L (ref 0–37)
Alkaline Phosphatase: 70 U/L (ref 39–117)
Glucose, Bld: 212 mg/dL — ABNORMAL HIGH (ref 70–99)
Sodium: 141 mEq/L (ref 135–145)
Total Bilirubin: 0.8 mg/dL (ref 0.3–1.2)
Total Protein: 7 g/dL (ref 6.0–8.3)

## 2011-06-15 LAB — HEPATIC FUNCTION PANEL
Albumin: 4.2 g/dL (ref 3.5–5.2)
Alkaline Phosphatase: 70 U/L (ref 39–117)
Bilirubin, Direct: 0.1 mg/dL (ref 0.0–0.3)

## 2011-06-15 LAB — HEMOGLOBIN A1C: Hgb A1c MFr Bld: 6.4 % (ref 4.6–6.5)

## 2011-06-15 NOTE — Progress Notes (Signed)
Subjective:    Patient ID: Michael Snyder, male    DOB: Oct 25, 1950, 60 y.o.   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 He reports that he is feeling well with no specific medical problems.  Past Medical History  Diagnosis Date  . Alcohol abuse   . 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 Diabetes mellitus, type 2   . Seizure disorder   . Pacemaker     Medtronic Adapta [id number]   Past Surgical History  Procedure Date  . Laceration repair     scalp  . Ptdvp     7/08 medtronic DDD   No family history on file. History   Social History  . Marital Status: Married    Spouse Name: N/A    Number of Children: N/A  . Years of Education: N/A   Occupational History  . Not on file.   Social History Main Topics  . Smoking status: Not on file  . Smokeless tobacco: Not on file  . Alcohol Use: Not on file  . Drug Use: Not on file  . Sexually Active: Not on file   Other Topics Concern  . Not on file   Social History Narrative   12th grade educationH/O substance abuse- now abstemious and works with other in recovery. Attends NA on a regular basis and serves as a sponsor.Married 18 years-divorced. 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  HEENT:  Negative for hearing loss, ear pain, congestion, neck stiffness and postnasal drip. Negative for sore throat or swallowing problems. Negative for dental complaints.   Eyes: Negative for vision loss or change in visual acuity.  Respiratory: Negative for chest tightness and wheezing. Negative for DOE.   Cardiovascular: Negative for chest pain or palpitations. No decreased exercise tolerance Gastrointestinal: No change in bowel habit. No bloating or gas. No reflux or indigestion Genitourinary: Negative for urgency, frequency, flank pain and  difficulty urinating.  Musculoskeletal: Negative for myalgias, back pain, arthralgias and gait problem.  Neurolohgical: Negative for dizziness, tremors, weakness and headaches.  Hematological: Negative for adenopathy.  Psychiatric/Behavioral: Negative for behavioral problems and dysphoric mood.       Objective:   Physical Exam Vital signs reviewed-stable. Gen'l: Well nourished well developed AA male in no acute distress  HEENT: Head: Normocephalic and atraumatic. Right Ear: External ear normal. EAC/TM nl. Left Ear: External ear normal.  EAC/TM nl. Nose: Nose normal. Mouth/Throat: Oropharynx is clear and moist. Dentition - partial plates upper and lower. No buccal or palatal lesions. Posterior pharynx clear. Eyes: Conjunctivae and sclera clear. EOM intact. Pupils are equal, round, and reactive to light. Right eye exhibits no discharge. Left eye exhibits no discharge. Neck: Normal range of motion. Neck supple. No JVD present. No tracheal deviation present. No thyromegaly present.  Cardiovascular: Normal rate, regular rhythm. Heart sounds are distant but no gallop, no friction rub, no murmur heard.      Quiet precordium. 2+ radial and DP pulses . No carotid bruits Pulmonary/Chest: Effort normal. No respiratory distress or increased WOB, no wheezes, no rales. No chest wall deformity or CVAT. Abdominal: Soft. Bowel sounds are normal in all quadrants. He exhibits no distension, no tenderness, no rebound or guarding, No heptosplenomegaly  Genitourinary:  deferred Musculoskeletal: Normal range of motion. He exhibits no  edema and no tenderness.       Small and large joints without redness, synovial thickening or deformity. Full range of motion preserved about all small, median and large joints.  Lymphadenopathy:    He has no cervical or supraclavicular adenopathy.  Neurological: He is alert and oriented to person, place, and time. CN II-XII intact. DTRs 2+ and symmetrical biceps, radial and patellar  tendons. Cerebellar function normal with no tremor, rigidity, normal gait and station.  Skin: Skin is warm and dry. No rash noted. No erythema.  Psychiatric: He has a normal mood and affect. His behavior is normal. Thought content normal.   Lab Results  Component Value Date   WBC 4.9 06/15/2011   HGB 12.3* 06/15/2011   HCT 36.1* 06/15/2011   PLT 136.0* 06/15/2011   GLUCOSE 212* 06/15/2011   CHOL 157 06/15/2011   TRIG 105.0 06/15/2011   HDL 48.30 06/15/2011   LDLCALC 88 06/15/2011   ALT 37 06/15/2011   ALT 37 06/15/2011   AST 37 06/15/2011   AST 37 06/15/2011   NA 141 06/15/2011   K 3.5 06/15/2011   CL 108 06/15/2011   CREATININE 1.4 06/15/2011   BUN 18 06/15/2011   CO2 24 06/15/2011   TSH 2.60 06/15/2011   PSA 0.83 05/27/2010   INR 1.1 01/13/2007   HGBA1C 6.4 06/15/2011           Assessment & Plan:

## 2011-06-16 DIAGNOSIS — Z0000 Encounter for general adult medical examination without abnormal findings: Secondary | ICD-10-CM | POA: Insufficient documentation

## 2011-06-16 NOTE — Assessment & Plan Note (Signed)
Lab Results  Component Value Date   HGBA1C 6.4 06/15/2011   Good control!! No change in regimen

## 2011-06-16 NOTE — Assessment & Plan Note (Signed)
BP Readings from Last 3 Encounters:  06/15/11 118/80  12/26/10 128/78  09/01/10 140/84   Good control of BP on present medications

## 2011-06-16 NOTE — Assessment & Plan Note (Signed)
Very stable with no reported seizures, no for several years.   Plan - continue gabapentin

## 2011-06-16 NOTE — Assessment & Plan Note (Signed)
Interval history is benign. Physical exam is normal. Lab results are in normal limits. He is current with colorectal cancer screening with last colonoscopy August '09. Immunizations: tetanus Sept '09; due for pneumonia and shingles vaccines. See DOT forms - normal vision, nl U/A  In summary- a nice man who is medically stable and doing well. He will return in 1 year or as needed.

## 2011-07-20 ENCOUNTER — Encounter: Payer: Self-pay | Admitting: *Deleted

## 2011-08-03 ENCOUNTER — Encounter: Payer: Self-pay | Admitting: Internal Medicine

## 2011-08-03 ENCOUNTER — Ambulatory Visit (INDEPENDENT_AMBULATORY_CARE_PROVIDER_SITE_OTHER): Payer: 59 | Admitting: *Deleted

## 2011-08-03 DIAGNOSIS — I495 Sick sinus syndrome: Secondary | ICD-10-CM

## 2011-08-09 LAB — REMOTE PACEMAKER DEVICE
AL AMPLITUDE: 2.8 mv
RV LEAD AMPLITUDE: 8 mv
RV LEAD IMPEDENCE PM: 505 Ohm
RV LEAD THRESHOLD: 0.75 V
VENTRICULAR PACING PM: 0

## 2011-08-12 NOTE — Progress Notes (Signed)
Remote pacer check  

## 2011-08-25 ENCOUNTER — Encounter: Payer: Self-pay | Admitting: *Deleted

## 2011-10-29 ENCOUNTER — Other Ambulatory Visit: Payer: Self-pay | Admitting: Internal Medicine

## 2011-11-26 ENCOUNTER — Encounter: Payer: Self-pay | Admitting: Internal Medicine

## 2011-11-26 ENCOUNTER — Ambulatory Visit (INDEPENDENT_AMBULATORY_CARE_PROVIDER_SITE_OTHER): Payer: 59 | Admitting: Internal Medicine

## 2011-11-26 VITALS — BP 112/74 | HR 86 | Ht 72.0 in | Wt 178.0 lb

## 2011-11-26 DIAGNOSIS — Z950 Presence of cardiac pacemaker: Secondary | ICD-10-CM | POA: Insufficient documentation

## 2011-11-26 DIAGNOSIS — I495 Sick sinus syndrome: Secondary | ICD-10-CM

## 2011-11-26 LAB — PACEMAKER DEVICE OBSERVATION
AL AMPLITUDE: 1 mv
AL IMPEDENCE PM: 440 Ohm
AL THRESHOLD: 0.625 V
RV LEAD AMPLITUDE: 4 mv
RV LEAD IMPEDENCE PM: 481 Ohm

## 2011-11-26 NOTE — Patient Instructions (Signed)
Remote monitoring is used to monitor your Pacemaker of ICD from home. This monitoring reduces the number of office visits required to check your device to one time per year. It allows [place] to keep an eye on the functioning of your device to ensure it is working properly. You are scheduled for a device check from home on 03/03/12. You may send your transmission at any time that day. If you have a wireless device, the transmission will be sent automatically. After your physician reviews your transmission, you will receive a postcard with your next transmission date.   Your physician wants you to follow-up in: 1 year with Dr. Graciela Husbands. You will receive a reminder letter in the mail two months in advance. If you don't receive a letter, please call our office to schedule the follow-up appointment.   Your physician recommends that you continue on your current medications as directed. Please refer to the Current Medication list given to you today.

## 2011-11-26 NOTE — Progress Notes (Signed)
  HPI  Michael Snyder is a 61 y.o. male seen in followup for pacemaker implanted because of symptomatic sinus node dysfunction and profound nocturnal bradycardia not felt to be related to sleep apnea. He has had no further symptoms of lightheadedness following device implantation.  The patient denies SOB, chest pain, edema or palpitations  Evaluation of his left ventricular function by echo in 2008 demonstrated an EF of 55-65%.       Past Medical History  Diagnosis Date  . Alcohol abuse   . 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 Diabetes mellitus, type 2   . Seizure disorder   . Pacemaker     Medtronic Adapta [id number]    Past Surgical History  Procedure Date  . Laceration repair     scalp  . Ptdvp     7/08 medtronic DDD    Current Outpatient Prescriptions  Medication Sig Dispense Refill  . Calcium Carbonate-Vitamin D (CALTRATE 600+D) 600-400 MG-UNIT per tablet Take 1 tablet by mouth 2 (two) times daily.        . cyclobenzaprine (FLEXERIL) 10 MG tablet Take 10 mg by mouth 3 (three) times daily as needed.        . gabapentin (NEURONTIN) 300 MG capsule TAKE 1 CAPSULE EVERY 6 HOURS AS NEEDED FOR PAIN  90 capsule  3  . Ginkgo Biloba (GINKOBA) 40 MG TABS Take by mouth 2 (two) times daily.        [name] glipiZIDE (GLUCOTROL) 5 MG tablet TAKE 1 TABLET TWICE A DAY  180 tablet  0  . glucose blood test strip 1 each by Other route daily. Use as instructed       . ibuprofen (ADVIL,MOTRIN) 800 MG tablet Take 800 mg by mouth every 8 (eight) hours as needed.        [name] lisinopril-hydrochlorothiazide (PRINZIDE,ZESTORETIC) 10-12.5 MG per tablet TAKE 1 TABLET DAILY  90 tablet  4  . MULTIPLE VITAMIN PO Take by mouth daily.        . Omega-3 Fatty Acids (FISH OIL) 300 MG CAPS Take by mouth.        . tadalafil (CIALIS) 20 MG tablet Take 20 mg by mouth daily as needed.        . traMADol (ULTRAM) 50 MG tablet Take 50 mg by mouth 2 (two) times daily.          No Known Allergies  Review of Systems  negative except from HPI and PMH  Physical Exam BP 112/74  Pulse 86  Ht 6' (1.829 m)  Wt 178 lb (80.74 kg)  BMI 24.14 kg/m2 Well developed and well nourished in no acute distress HENT normal E scleral and icterus clear Neck Supple JVP flat; carotids brisk and full Clear to ausculation Regular rate and rhythm, no murmurs gallops or rub Soft with active bowel sounds No clubbing cyanosis none Edema Alert and oriented, grossly normal motor and sensory function Skin Warm and Dry    Assessment and  Plan

## 2011-11-26 NOTE — Assessment & Plan Note (Signed)
The patient is paced in the atrium about 10% of the time. He has a couple of episodes of brief "atrial fibrillation" all echograms are not available. This becomes important because he apparently is told that he had a stroke in his eye. He is not a candidate he doesn't think her aspirin because of an allergy. The issue was what is the source of the stroke if in fact that is correct. It is not clear to me from the recommendations that the patient recounts from the ophthalmologist that in fact a stroke  We'll need to followup with him about that as it would require some type of anticoagulant

## 2011-11-26 NOTE — Assessment & Plan Note (Signed)
The patient's device was interrogated.  The information was reviewed. No changes were made in the programming.    

## 2011-12-08 ENCOUNTER — Telehealth: Payer: Self-pay | Admitting: Internal Medicine

## 2011-12-08 NOTE — Telephone Encounter (Signed)
I will forward to Dr. Graciela Husbands as I am not sure if her spoke with the patient's eye doctor or not. Eye MD is Dr. Lisette Abu [phone].

## 2011-12-08 NOTE — Telephone Encounter (Signed)
New problem: Last Patient calling to see if Dr. Graciela Husbands contacted his eye md . Last seen office 5/23. Was told he might had a cva.

## 2011-12-09 NOTE — Telephone Encounter (Signed)
Spoke with eye md; he thought the episode in his eye could have been thromboembolic and as such would be a candidate for Oral anticoagulation with evidence of afib on the device--although withut electrograms.  He can either come in or [facility] coumad[facility]) can have discussion with him about specific choices thaksn

## 2011-12-14 NOTE — Telephone Encounter (Signed)
Please return call to patient at [phone] regarding f/u

## 2011-12-14 NOTE — Telephone Encounter (Signed)
I spoke with the patient. He is aware of Dr. Odessa Fleming recommendations. He is also aware I will be talking with Kennon Rounds tomorrow to see if he can be scheduled for a med management appointment with her to discuss his options for blood thinners. He is agreeable. I will call him back tomorrow.

## 2011-12-15 NOTE — Telephone Encounter (Signed)
I spoke with Kennon Rounds, she will see the patient in a New Coumadin slot. I have spoken with the patient. He will come in on 12/22/11 at 4:15 pm.

## 2011-12-22 ENCOUNTER — Ambulatory Visit (INDEPENDENT_AMBULATORY_CARE_PROVIDER_SITE_OTHER): Payer: 59 | Admitting: Pharmacist

## 2011-12-22 DIAGNOSIS — Z7901 Long term (current) use of anticoagulants: Secondary | ICD-10-CM

## 2011-12-22 MED ORDER — RIVAROXABAN 20 MG PO TABS: 20.0000 mg | ORAL_TABLET | Freq: Every day | ORAL | Status: DC

## 2011-12-22 NOTE — Progress Notes (Signed)
Pt of Dr. Odessa Fleming seen today to discuss anticoagulation options.  He has a history of afib.  He has previously been on Coumadin and would prefer other option due to frequent monitoring requirements.  Discussed Pradaxa, Xarelto and Eliquis with patient.  Compared bleeding risks, frequency of dosing and efficacy.  Pt states he would just like to be on the one we thought would be best.  Checked with cost options for xarelto and Eliquis.  Both are covered but require a PA.  Xarelto is a tier 2 medication and Eliquis is tier 3- difference of $30/month.  Pt would prefer Xarelto based on cost.  Reviewed renal function.  Scr- 1.4.  CrCl- 62 mL/min.  Will start at 20mg  once daily.  Pt given discount cards as well.  He is aware to take the medication with the largest meal of the day.

## 2011-12-23 ENCOUNTER — Telehealth: Payer: Self-pay | Admitting: *Deleted

## 2011-12-23 NOTE — Telephone Encounter (Signed)
Pts pharmacy called stating they do not refill meds as the quantity written only 90 day supplies. Will route this to sally Putt seems as if her and Dr, Graciela Husbands was wanting to start pt on a trial of meds due to cost. [phone].Marland KitchenMarland Kitchenrefernce # [id number]  Discussed Pradaxa, Xarelto and Eliquis with patient. Compared bleeding risks, frequency of dosing and efficacy. Pt states he would just like to be on the one we thought would be best. Checked with cost options for xarelto and Eliquis. Both are covered but require a PA. Xarelto is a tier 2 medication and Eliquis is tier 3- difference of $30/month. Pt would prefer Xarelto based on cost. Reviewed renal function. Scr- 1.4. CrCl- 62 mL/min. Will start at 20mg  once daily. Pt given discount cards as well. He is aware to take the medication with the largest meal of the day.  Michelene Gardener, [facility] 12/22/2011 6:23 PM Signed

## 2011-12-23 NOTE — Telephone Encounter (Signed)
Spoke with pharmacy.  I accidentally sent Rx for free trial card to mail order pharmacy rather than the patient's local pharmacy.  Asked [facility] to discontinue Rx and pt will pick up locally.  Pt is aware of this information.

## 2012-01-08 ENCOUNTER — Telehealth: Payer: Self-pay | Admitting: Internal Medicine

## 2012-01-08 NOTE — Telephone Encounter (Signed)
New msg Pt said dot medical card didn't have expiration date on it. He wants another one if possible. Please call

## 2012-01-08 NOTE — Telephone Encounter (Signed)
I spoke with the patient. He states he called the wrong doctors office. He was supposed to call Dr. Debby Bud office.

## 2012-01-13 ENCOUNTER — Telehealth: Payer: Self-pay | Admitting: *Deleted

## 2012-01-13 NOTE — Telephone Encounter (Signed)
Patient notified of form for  Medical examiners certificate ready for pick up

## 2012-01-15 ENCOUNTER — Other Ambulatory Visit: Payer: Self-pay | Admitting: Internal Medicine

## 2012-01-18 MED ORDER — RIVAROXABAN 20 MG PO TABS: 20.0000 mg | ORAL_TABLET | Freq: Every day | ORAL | Status: DC

## 2012-01-30 ENCOUNTER — Other Ambulatory Visit: Payer: Self-pay | Admitting: Internal Medicine

## 2012-03-03 ENCOUNTER — Encounter: Payer: 59 | Admitting: *Deleted

## 2012-03-10 ENCOUNTER — Encounter: Payer: Self-pay | Admitting: *Deleted

## 2012-03-11 ENCOUNTER — Ambulatory Visit (INDEPENDENT_AMBULATORY_CARE_PROVIDER_SITE_OTHER): Payer: 59 | Admitting: *Deleted

## 2012-03-11 DIAGNOSIS — Z950 Presence of cardiac pacemaker: Secondary | ICD-10-CM

## 2012-03-11 DIAGNOSIS — I495 Sick sinus syndrome: Secondary | ICD-10-CM

## 2012-03-14 LAB — REMOTE PACEMAKER DEVICE
ATRIAL PACING PM: 15
BAMS-0001: 175 {beats}/min
BATTERY VOLTAGE: 2.75 V
RV LEAD AMPLITUDE: 8 mv
VENTRICULAR PACING PM: 0

## 2012-04-18 ENCOUNTER — Other Ambulatory Visit: Payer: Self-pay | Admitting: Internal Medicine

## 2012-04-18 ENCOUNTER — Telehealth: Payer: Self-pay | Admitting: Internal Medicine

## 2012-04-18 MED ORDER — GLUCOSE BLOOD VI STRP: ORAL_STRIP | Status: DC

## 2012-04-18 MED ORDER — GLIPIZIDE 5 MG PO TABS: 5.0000 mg | ORAL_TABLET | Freq: Two times a day (BID) | ORAL | Status: DC

## 2012-04-18 MED ORDER — LISINOPRIL-HYDROCHLOROTHIAZIDE 10-12.5 MG PO TABS: 1.0000 | ORAL_TABLET | Freq: Every day | ORAL | Status: DC

## 2012-04-18 NOTE — Telephone Encounter (Signed)
The patient called and is hoping to get a pneumonia shot tomorrow afternoon.  Is that okay?

## 2012-04-18 NOTE — Telephone Encounter (Signed)
Patient came to office . Stated he had lost his meteor and test stirps to check his diabetes once a day. Sample meteor given One Touch Verio IQ to use. Order for medication refills and test strips sent to [facility]

## 2012-04-18 NOTE — Telephone Encounter (Signed)
Request refill on DM supplies be sent to [facility], please send 90 day supply

## 2012-04-18 NOTE — Telephone Encounter (Signed)
Ok for nurse visit for pneumonia vaccine

## 2012-04-19 ENCOUNTER — Ambulatory Visit: Payer: 59

## 2012-04-19 ENCOUNTER — Encounter: Payer: Self-pay | Admitting: *Deleted

## 2012-04-21 ENCOUNTER — Telehealth: Payer: Self-pay | Admitting: Physician Assistant

## 2012-04-21 NOTE — Telephone Encounter (Signed)
Patient called regarding his pacemaker. He got a letter in the mail a few weeks ago about missing a transmission. He sent in his transmission a few days ago and hasn't heard back yet. I do not have access to those tracings after hours.He is not having any current symptoms or problems.  I advised him to call the office tomorrow during business hours to discuss with the device clinic. He verbalized understanding and gratitude.

## 2012-04-22 ENCOUNTER — Telehealth: Payer: Self-pay | Admitting: Internal Medicine

## 2012-04-22 NOTE — Telephone Encounter (Signed)
Plz return call to pt [phone] regarding remote transmission questions

## 2012-04-22 NOTE — Telephone Encounter (Signed)
Patient had no received confirmation of remote done in September. Letter was sent 04/19/12.  Results were reviewed and patient is aware of follow up.

## 2012-04-29 ENCOUNTER — Encounter: Payer: Self-pay | Admitting: Cardiovascular Disease

## 2012-05-02 ENCOUNTER — Other Ambulatory Visit: Payer: Self-pay | Admitting: *Deleted

## 2012-05-02 DIAGNOSIS — E119 Type 2 diabetes mellitus without complications: Secondary | ICD-10-CM

## 2012-05-02 MED ORDER — GLUCOSE BLOOD VI STRP: ORAL_STRIP | Status: DC

## 2012-05-05 ENCOUNTER — Other Ambulatory Visit: Payer: Self-pay | Admitting: *Deleted

## 2012-05-05 DIAGNOSIS — E119 Type 2 diabetes mellitus without complications: Secondary | ICD-10-CM

## 2012-05-05 MED ORDER — GLUCOSE BLOOD VI STRP: ORAL_STRIP | Status: DC

## 2012-06-20 ENCOUNTER — Encounter: Payer: 59 | Admitting: *Deleted

## 2012-07-04 ENCOUNTER — Encounter: Payer: Self-pay | Admitting: *Deleted

## 2012-07-11 ENCOUNTER — Ambulatory Visit (INDEPENDENT_AMBULATORY_CARE_PROVIDER_SITE_OTHER): Payer: 59 | Admitting: *Deleted

## 2012-07-11 ENCOUNTER — Other Ambulatory Visit: Payer: Self-pay | Admitting: Internal Medicine

## 2012-07-11 DIAGNOSIS — I495 Sick sinus syndrome: Secondary | ICD-10-CM

## 2012-07-11 DIAGNOSIS — Z950 Presence of cardiac pacemaker: Secondary | ICD-10-CM

## 2012-07-11 LAB — REMOTE PACEMAKER DEVICE
AL IMPEDENCE PM: 452 Ohm
AL THRESHOLD: 0.5 V
BATTERY VOLTAGE: 2.75 V
RV LEAD THRESHOLD: 0.75 V
VENTRICULAR PACING PM: 0

## 2012-07-11 MED ORDER — TADALAFIL 20 MG PO TABS: 20.0000 mg | ORAL_TABLET | Freq: Every day | ORAL | Status: DC | PRN

## 2012-07-11 NOTE — Telephone Encounter (Signed)
1. Ok to refill cialis - #6, 1 refill 2. Diabetic, hypertensive - needs OV and labs. No further refill on medications w/o OV.

## 2012-07-11 NOTE — Telephone Encounter (Signed)
Pt called requesting a refill of Cialis. Last office visit 06/2011. Please read note below and advise.

## 2012-07-11 NOTE — Telephone Encounter (Signed)
Patient Information:  Caller Name: T[place]  Phone: [phone]  Patient: Michael Snyder, Michael Snyder  Gender: Male  DOB: 1951-05-24  Age: 62 Years  PCP: Illene Regulus (Adults only)  Office Follow Up:  Does the office need to follow up with this patient?: Yes  Instructions For The Office: Please call concerning Cialis refill.  RN Note:  Patient calling for a refill of Cialis. Per EPIC, no appointment since 06/2011. Offered to transfer patient to appointments. He declined at this time. Pharmacy is [facility] Message sent.   Symptoms  Reason For Call & Symptoms: Patient is calling for a refill of Cialis. States he has not taken prescription in awhile but has had problems with erections again the last 3-4 months.  Reviewed Health History In EMR: Yes  Reviewed Medications In EMR: Yes  Reviewed Allergies In EMR: Yes  Reviewed Surgeries / Procedures: Yes  Date of Onset of Symptoms: 03/07/2012  Guideline(s) Used:  No Protocol Available - Sick Adult  Disposition Per Guideline:   See Within 2 Weeks in Office  Reason For Disposition Reached:   Nursing judgment  Advice Given:  Call Back If:  You become worse.  Patient Refused Recommendation:  Patient Requests Prescription  Please call concerning Cialis refill.

## 2012-07-27 ENCOUNTER — Other Ambulatory Visit: Payer: Self-pay | Admitting: *Deleted

## 2012-07-27 MED ORDER — GABAPENTIN 300 MG PO CAPS: 300.0000 mg | ORAL_CAPSULE | Freq: Four times a day (QID) | ORAL | Status: DC | PRN

## 2012-08-03 ENCOUNTER — Encounter: Payer: 59 | Admitting: Internal Medicine

## 2012-09-13 ENCOUNTER — Ambulatory Visit (INDEPENDENT_AMBULATORY_CARE_PROVIDER_SITE_OTHER): Payer: 59 | Admitting: Internal Medicine

## 2012-09-13 ENCOUNTER — Encounter: Payer: Self-pay | Admitting: Internal Medicine

## 2012-09-13 ENCOUNTER — Other Ambulatory Visit (INDEPENDENT_AMBULATORY_CARE_PROVIDER_SITE_OTHER): Payer: 59

## 2012-09-13 VITALS — BP 120/84 | HR 64 | Temp 97.5°F | Ht 72.0 in | Wt 180.0 lb

## 2012-09-13 DIAGNOSIS — I10 Essential (primary) hypertension: Secondary | ICD-10-CM

## 2012-09-13 DIAGNOSIS — E119 Type 2 diabetes mellitus without complications: Secondary | ICD-10-CM

## 2012-09-13 DIAGNOSIS — Z0000 Encounter for general adult medical examination without abnormal findings: Secondary | ICD-10-CM

## 2012-09-13 DIAGNOSIS — I495 Sick sinus syndrome: Secondary | ICD-10-CM

## 2012-09-13 DIAGNOSIS — Z23 Encounter for immunization: Secondary | ICD-10-CM

## 2012-09-13 LAB — COMPREHENSIVE METABOLIC PANEL
Alkaline Phosphatase: 77 U/L (ref 39–117)
BUN: 13 mg/dL (ref 6–23)
Creatinine, Ser: 1.4 mg/dL (ref 0.4–1.5)
Glucose, Bld: 126 mg/dL — ABNORMAL HIGH (ref 70–99)
Sodium: 141 mEq/L (ref 135–145)
Total Bilirubin: 1.1 mg/dL (ref 0.3–1.2)

## 2012-09-13 LAB — HEMOGLOBIN A1C: Hgb A1c MFr Bld: 7.4 % — ABNORMAL HIGH (ref 4.6–6.5)

## 2012-09-13 NOTE — Patient Instructions (Addendum)
Thanks for coming in.  Your exam is normal except for a very little decrease in deep vibratory sensation feet  Labs are ordered for today and results will be sent with the letter I send you.

## 2012-09-13 NOTE — Progress Notes (Signed)
Subjective:    Patient ID: Michael Snyder, male    DOB: 1951-01-08, 62 y.o.   MRN: [id number]  HPI Michael Snyder is here for annual  wellness examination and management of other chronic and acute problems.   The risk factors are reflected in the social history.  The roster of all physicians providing medical care to patient - is listed in the Snapshot section of the chart.  Activities of daily living:  The patient is 100% inedpendent in all ADLs: dressing, toileting, feeding as well as independent mobility   There is no risks for hepatitis, STDs or HIV. There is no  history of blood transfusion. They have no travel history to infectious disease endemic areas of the world.  The patient has seen their dentist in the last six month. They have seen their eye doctor in the last year. They deny any hearing difficulty and have not had audiologic testing in the last year.    They do not  have excessive sun exposure. Discussed the need for sun protection: hats, long sleeves and use of sunscreen if there is significant sun exposure.   Diet: the importance of a healthy diet is discussed.   The patient has no regular exercise program.  The benefits of regular aerobic exercise were discussed.  Depression screen: there are no signs or vegative symptoms of depression- irritability, change in appetite, anhedonia, sadness/tearfullness.  Cognitive assessment: the patient manages all their financial and personal affairs and is actively engaged.   The following portions of the patient's history were reviewed and updated as appropriate: allergies, current medications, past family history, past medical history,  past surgical history, past social history  and problem list.  Past Medical History  Diagnosis Date  . Alcohol abuse   . 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 Diabetes mellitus, type 2   . Seizure disorder   . 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 History of alcohol abuse    Past  Surgical History  Procedure Laterality Date  . Laceration repair      scalp  . Ptdvp      7/08 medtronic DDD   Family History  Problem Relation Age of Onset  . Hypertension Mother   . COPD Mother   . Hypertension Brother   . Hypertension Brother    History   Social History  . Marital Status: Married    Spouse Name: Michael Snyder    Number of Children: Michael Snyder  . Years of Education: Michael Snyder   Occupational History  . Not on file.   Social History Main Topics  . Smoking status: Never Smoker   . Smokeless tobacco: Never Used  . Alcohol Use: No  . Drug Use: No  . Sexually Active: Yes -- Male partner(s)   Other Topics Concern  . Not on file   Social History Narrative   12th grade education. H/O substance abuse- now abstemious and works with other in recovery..Attends NA on a regular basis and serves as a sponsor. Married 18 years-divorced. Married 5 years 2nd marriage.3 daughters previous marriage, 9 grandchildren, 3 great-grands. Work- Arts administrator             Current Outpatient Prescriptions on File Prior to Visit  Medication Sig Dispense Refill  . Calcium Carbonate-Vitamin D (CALTRATE 600+D) 600-400 MG-UNIT per tablet Take 1 tablet by mouth 2 (two) times daily.        [name] gabapentin (NEURONTIN) 300 MG  capsule Take 1 capsule (300 mg total) by mouth every 6 (six) hours as needed.  90 capsule  2  . Ginkgo Biloba (GINKOBA) 40 MG TABS Take by mouth 2 (two) times daily.        [name] glipiZIDE (GLUCOTROL) 5 MG tablet Take 1 tablet (5 mg total) by mouth 2 (two) times daily before a meal.  180 tablet  3  . glucose blood (ONETOUCH VERIO IQ) test strip Use as instructed once daily dx 250.00  100 each  3  . ibuprofen (ADVIL,MOTRIN) 800 MG tablet Take 800 mg by mouth every 8 (eight) hours as needed.        [name] lisinopril-hydrochlorothiazide (PRINZIDE,ZESTORETIC) 10-12.5 MG per tablet Take 1 tablet by mouth daily.  90 tablet  3  . MULTIPLE VITAMIN PO Take by mouth daily.        . Omega-3 Fatty  Acids (FISH OIL) 300 MG CAPS Take by mouth.        . Rivaroxaban (XARELTO) 20 MG TABS Take 1 tablet (20 mg total) by mouth daily.  10 tablet  0  . tadalafil (CIALIS) 20 MG tablet Take 1 tablet (20 mg total) by mouth daily as needed.  6 tablet  1  . traMADol (ULTRAM) 50 MG tablet Take 50 mg by mouth 2 (two) times daily.         No current facility-administered medications on file prior to visit.     Vision, hearing, body mass index were assessed and reviewed.   During the course of the visit the patient was educated and counseled about appropriate screening and preventive services including : fall prevention , diabetes screening, nutrition counseling, colorectal cancer screening, and recommended immunizations.    Review of Systems Constitutional:  Negative for fever, chills, activity change and unexpected weight change.  HEENT:  Negative for hearing loss, ear pain, congestion, neck stiffness and postnasal drip. Negative for sore throat or swallowing problems. Negative for dental complaints.   Eyes: Negative for vision loss or change in visual acuity.  Respiratory: Negative for chest tightness and wheezing. Negative for DOE.   Cardiovascular: Negative for chest pain or palpitations. No decreased exercise tolerance Gastrointestinal: No change in bowel habit. No bloating or gas. No reflux or indigestion Genitourinary: Negative for urgency, frequency, flank pain and difficulty urinating.  Musculoskeletal: Negative for myalgias, back pain, arthralgias and gait problem.  Neurological: Negative for dizziness, tremors, weakness and headaches.  Hematological: Negative for adenopathy.  Psychiatric/Behavioral: Negative for behavioral problems and dysphoric mood.       Objective:   Physical Exam Filed Vitals:   09/13/12 1333  BP: 120/84  Pulse: 64  Temp: 97.5 F (36.4 C)   Wt Readings from Last 3 Encounters:  09/13/12 180 lb (81.647 kg)  11/26/11 178 lb (80.74 kg)  06/15/11 179 lb  (81.194 kg)   Gen'l: Well nourished well developed AA male in no acute distress  HEENT: Head: Normocephalic and atraumatic. Right Ear: External ear normal. EAC/TM nl. Left Ear: External ear normal.  EAC/TM nl. Nose: Nose normal. Mouth/Throat: Oropharynx is clear and moist. Dentition - native, in good repair. No buccal or palatal lesions. Posterior pharynx clear. Eyes: Conjunctivae and sclera clear. EOM intact. Pupils are equal, round, and reactive to light. Right eye exhibits no discharge. Left eye exhibits no discharge. Neck: Normal range of motion. Neck supple. No JVD present. No tracheal deviation present. No thyromegaly present.  Cardiovascular: Normal rate, regular rhythm, no gallop, no friction rub, no murmur heard.  Quiet precordium. 2+ radial and DP pulses . No carotid bruits Pulmonary/Chest: Effort normal. No respiratory distress or increased WOB, no wheezes, no rales. No chest wall deformity or CVAT. Abdomen: Soft. Bowel sounds are normal in all quadrants. He exhibits no distension, no tenderness, no rebound or guarding, No heptosplenomegaly  Genitourinary:  deferred Musculoskeletal: Normal range of motion. He exhibits no edema and no tenderness.       Small and large joints without redness, synovial thickening or deformity. Full range of motion preserved about all small, median and large joints.  Lymphadenopathy:    He has no cervical or supraclavicular adenopathy.  Neurological: He is alert and oriented to person, place, and time. CN II-XII intact. DTRs 2+ and symmetrical biceps, radial and patellar tendons. Cerebellar function normal with no tremor, rigidity, normal gait and station. Foot exam: normal sensation to light touch, pin-prick and vibration. Skin: Skin is warm and dry. No rash noted. No erythema.  Psychiatric: He has a normal mood and affect. His behavior is normal. Thought content normal.   Lab Results  Component Value Date   WBC 4.9 06/15/2011   HGB 12.3* 06/15/2011    HCT 36.1* 06/15/2011   PLT 136.0* 06/15/2011   GLUCOSE 126* 09/13/2012   CHOL 157 06/15/2011   TRIG 105.0 06/15/2011   HDL 48.30 06/15/2011   LDLCALC 88 06/15/2011   ALT 47 09/13/2012   AST 41* 09/13/2012   NA 141 09/13/2012   K 3.7 09/13/2012   CL 111 09/13/2012   CREATININE 1.4 09/13/2012   BUN 13 09/13/2012   CO2 25 09/13/2012   TSH 2.60 06/15/2011   PSA 0.83 05/27/2010   INR 1.1 01/13/2007   HGBA1C 7.4* 09/13/2012           Assessment & Plan:

## 2012-09-14 NOTE — Assessment & Plan Note (Signed)
A1C 7.1 % represents good control.  Plan - continue present medications

## 2012-09-14 NOTE — Assessment & Plan Note (Signed)
BP Readings from Last 3 Encounters:  09/13/12 120/84  11/26/11 112/74  06/15/11 118/80   Good control. Normal renal function and electrolytes  Plan Continue present medication

## 2012-09-14 NOTE — Assessment & Plan Note (Signed)
With PTVDP. Follow by device clinic. His device interrogation has revealed PAF in the past. He has been stable and is on full anticoagulation. 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  Plan Keep follow up as instructed  Continue present medical regimen

## 2012-09-14 NOTE — Assessment & Plan Note (Signed)
Interval history is unremarkable - he has been medically stable. Physical exam is normal. Lab results reveal good control of chronic problems. He is current with colorectal cancer screening. Discussed pros and cons of prostate cancer screening (USPHCTF recommendations reviewed and ACU April '13 recommendations) and he defers evaluation at this time. Immunizations - for pneumonia vaccine today; he is to check on insurance coverage for shingles vaccine.  In summary  - a very nice, sharp man who is medically stable and doing well. He will return in 6 months for follow up.

## 2012-10-01 ENCOUNTER — Ambulatory Visit (INDEPENDENT_AMBULATORY_CARE_PROVIDER_SITE_OTHER): Payer: 59 | Admitting: Internal Medicine

## 2012-10-01 ENCOUNTER — Encounter: Payer: Self-pay | Admitting: Internal Medicine

## 2012-10-01 VITALS — BP 108/80 | HR 81 | Temp 98.5°F | Ht 72.0 in | Wt 175.5 lb

## 2012-10-01 DIAGNOSIS — R05 Cough: Secondary | ICD-10-CM

## 2012-10-01 DIAGNOSIS — R0981 Nasal congestion: Secondary | ICD-10-CM

## 2012-10-01 DIAGNOSIS — J3489 Other specified disorders of nose and nasal sinuses: Secondary | ICD-10-CM

## 2012-10-01 MED ORDER — HYDROCOD POLST-CHLORPHEN POLST 10-8 MG/5ML PO LQCR: 5.0000 mL | Freq: Two times a day (BID) | ORAL | Status: DC | PRN

## 2012-10-01 MED ORDER — SALINE NASAL SPRAY 0.65 % NA SOLN: 2.0000 | NASAL | Status: DC | PRN

## 2012-10-01 MED ORDER — PSEUDOEPHEDRINE HCL ER 120 MG PO TB12: 120.0000 mg | ORAL_TABLET | Freq: Two times a day (BID) | ORAL | Status: DC

## 2012-10-01 NOTE — Patient Instructions (Signed)
Please use the Sudafed, cough syrup and nasal spray as advised. Call us in a week if not better.

## 2012-10-01 NOTE — Progress Notes (Addendum)
Subjective:     Patient ID: Michael Snyder, male   DOB: 1951-03-28, 62 y.o.   MRN: [id number]  HPI Mr. Tiso presents to the acute clinic for nasal congestion and cough.  He started to have nasal congestion and nonproductive cough starting 4-5 days ago. The cough started to become more productive - clear phlegm in the last 2 days. No CP with the cough, no SOB, no wheezing. No h/o asthma or COPD. No fever or chills. No ear pain. No face pain but has frontal HAs that started at the same time the other spx started. He has some irritation from pollen, but not quite allergic spx.  Tried Claritin, Clorotabs (?). Did not try any cough syrup or Sudafed.   He has well-controlled DM2, last A1C 7.1%. He did not notice hyperglycemia lately, his sugars in am are in the 90's-100's.    Review of Systems Please see above.    Objective:   Physical Exam BP 108/80  Pulse 81  Temp(Src) 98.5 F (36.9 C) (Oral)  Ht 6' (1.829 m)  Wt 175 lb 8 oz (79.606 kg)  BMI 23.8 kg/m2  SpO2 98% Wt Readings from Last 3 Encounters:  10/01/12 175 lb 8 oz (79.606 kg)  09/13/12 180 lb (81.647 kg)  11/26/11 178 lb (80.74 kg)  Constitutional: normal weight, in NAD, wears a mask, voice normal Eyes: PERRLA, EOMI, no exophthalmos ENT: moist mucous membranes, no thyromegaly, no cervical lymphadenopathy, nonerythematous oro-pharynx, no tonsillomegaly. No sinus point tenderness. Normal tympanic membranes bilaterally. Cardiovascular: RRR, No MRG Respiratory: CTA B, no rales, rhonchi or wheezes Gastrointestinal: abdomen soft, NT, ND, BS+ Skin: moist, warm, no rashes    Assessment:     1. Cough with nasal congestion     Plan:     - Pt with cough and nasal congestion but without yellow-greenish sputum - no concerning features such as fever, SOB/chills, hypoxia, no signs of sinusitis or otitis - I sent Tussionex Pennkinetic to his pharmacy, along with Sudafed and normal saline nasal spray - pt advised to call us back if not  better in a week.

## 2012-10-03 ENCOUNTER — Telehealth: Payer: Self-pay | Admitting: Internal Medicine

## 2012-10-03 NOTE — Telephone Encounter (Signed)
Patient was seen at Saturday clinic, do you still want him to be scheduled

## 2012-10-03 NOTE — Telephone Encounter (Signed)
Oops - the message I got didn't make that clear. No - I don't want to see him.

## 2012-10-03 NOTE — Telephone Encounter (Signed)
May add on to schedule today or tomorrow

## 2012-10-03 NOTE — Telephone Encounter (Signed)
[facility] Triage Call Report Triage Record Num: [id number] Operator: Jeraldine Loots Patient Name: Kyjuan Gause Call Date & Time: 09/30/2012 5:21:35PM Patient Phone: [phone] PCP: Illene Regulus Patient Gender: Male PCP Fax : [phone] Patient DOB: 29-Dec-1950 [facility] Name: [facility] Reason for Call: Caller: Danarius/Patient; PCP: Illene Regulus (Adults only); CB#: [phone]; Call regarding cold sx for several days. Requesting antibiotics. His blood sugar was 98mg  this am. Triaged per Diabetes: Respiratory Sx. Needs to be seen in 72 hours. RN scheduled for Saturday, 3/29 at. Protocol(s) Used: Diabetes: Respiratory Problems Recommended Outcome per Protocol: See Provider within 72 Hours Reason for Outcome: All other situations

## 2012-10-14 ENCOUNTER — Telehealth: Payer: Self-pay | Admitting: *Deleted

## 2012-10-14 NOTE — Telephone Encounter (Signed)
Left msg on triage have appt this am to see Dr. Loreta Ave requesting his med list to be fax over. Called pt inform him will fax med list.../lmb

## 2012-10-26 ENCOUNTER — Encounter: Payer: Self-pay | Admitting: Internal Medicine

## 2012-10-31 ENCOUNTER — Other Ambulatory Visit: Payer: Self-pay | Admitting: Internal Medicine

## 2012-11-10 ENCOUNTER — Encounter: Payer: Self-pay | Admitting: Internal Medicine

## 2012-11-17 ENCOUNTER — Encounter: Payer: Self-pay | Admitting: Internal Medicine

## 2012-11-17 ENCOUNTER — Ambulatory Visit (INDEPENDENT_AMBULATORY_CARE_PROVIDER_SITE_OTHER): Payer: 59 | Admitting: Internal Medicine

## 2012-11-17 VITALS — BP 144/92 | HR 87 | Ht 72.0 in | Wt 179.0 lb

## 2012-11-17 DIAGNOSIS — I4891 Unspecified atrial fibrillation: Secondary | ICD-10-CM

## 2012-11-17 DIAGNOSIS — Z950 Presence of cardiac pacemaker: Secondary | ICD-10-CM

## 2012-11-17 DIAGNOSIS — I495 Sick sinus syndrome: Secondary | ICD-10-CM

## 2012-11-17 LAB — BASIC METABOLIC PANEL
BUN: 17 mg/dL (ref 6–23)
CO2: 24 mEq/L (ref 19–32)
Calcium: 9.3 mg/dL (ref 8.4–10.5)
Creatinine, Ser: 1.4 mg/dL (ref 0.4–1.5)
GFR: 65.79 mL/min (ref >60.00)
Glucose, Bld: 82 mg/dL (ref 70–99)
Sodium: 140 mEq/L (ref 135–145)

## 2012-11-17 LAB — PACEMAKER DEVICE OBSERVATION
AL THRESHOLD: 0.75 V
ATRIAL PACING PM: 12
RV LEAD AMPLITUDE: 4 mv
RV LEAD IMPEDENCE PM: 523 Ohm
RV LEAD THRESHOLD: 0.75 V

## 2012-11-17 NOTE — Assessment & Plan Note (Signed)
The patient's device was interrogated.  The information was reviewed. No changes were made in the programming.    

## 2012-11-17 NOTE — Assessment & Plan Note (Signed)
inferquent pacing

## 2012-11-17 NOTE — Patient Instructions (Addendum)
Your physician recommends that you have lab work: bmp  Remote monitoring is used to monitor your Pacemaker of ICD from home. This monitoring reduces the number of office visits required to check your device to one time per year. It allows [place] to keep an eye on the functioning of your device to ensure it is working properly. You are scheduled for a device check from home on 02/20/13. You may send your transmission at any time that day. If you have a wireless device, the transmission will be sent automatically. After your physician reviews your transmission, you will receive a postcard with your next transmission date.  Your physician wants you to follow-up in: 1 year with Dr. Graciela Husbands. You will receive a reminder letter in the mail two months in advance. If you don't receive a letter, please call our office to schedule the follow-up appointment.  Your physician recommends that you continue on your current medications as directed. Please refer to the Current Medication list given to you today.

## 2012-11-17 NOTE — Assessment & Plan Note (Signed)
Detected on his device  Continue  ,r.r Rivaroxaban

## 2012-11-17 NOTE — Progress Notes (Signed)
Patient Care Team: [name], MD as PCP - General   HPI  Michael Snyder is a 62 y.o. male seen in followup for pacemaker implanted 2008  because of symptomatic sinus node dysfunction and profound nocturnal bradycardia not felt to be related to sleep apnea. He has had no further symptoms of lightheadedness following device implantation.    The patient denies SOB, chest pain, edema or palpitations  Evaluation of his left ventricular function by echo in 2008 demonstrated an EF of 55-65%.   And he has a history of a retinal event that was thought to be possibly thromboembolic. He had atrial high rate episodes detected on his device and the concern was that this represented atrial fibrillation. He was started on Rivaroxaban which he is tolerating; renal function was normal a year ago. At that time thanks    Past Medical History  Diagnosis Date  . Alcohol abuse   . 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 Diabetes mellitus, type 2   . Seizure disorder   . 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 History of alcohol abuse     Past Surgical History  Procedure Laterality Date  . Laceration repair      scalp  . 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 Calcium Carbonate-Vitamin D (CALTRATE 600+D) 600-400 MG-UNIT per tablet Take 1 tablet by mouth 2 (two) times daily.        . chlorpheniramine-HYDROcodone (TUSSIONEX PENNKINETIC ER) 10-8 MG/5ML LQCR Take 5 mLs by mouth every 12 (twelve) hours as needed (cough).  115 mL  0  . gabapentin (NEURONTIN) 300 MG capsule Take 1 capsule (300 mg total) by mouth every 6 (six) hours as needed.  90 capsule  2  . Ginkgo Biloba (GINKOBA) 40 MG TABS Take by mouth 2 (two) times daily.        [name] glipiZIDE (GLUCOTROL) 5 MG tablet Take 1 tablet (5 mg total) by mouth 2 (two) times daily before a meal.  180 tablet  3  . glucose blood (ONETOUCH VERIO IQ) test strip Use as instructed once daily  dx 250.00  100 each  3  . ibuprofen (ADVIL,MOTRIN) 800 MG tablet Take 800 mg by mouth every 8 (eight) hours as needed.        [name] lisinopril-hydrochlorothiazide (PRINZIDE,ZESTORETIC) 10-12.5 MG per tablet Take 1 tablet by mouth daily.  90 tablet  3  . MULTIPLE VITAMIN PO Take by mouth daily.        . Omega-3 Fatty Acids (FISH OIL) 300 MG CAPS Take by mouth.        . pseudoephedrine (SUDAFED 12 HOUR) 120 MG 12 hr tablet Take 1 tablet (120 mg total) by mouth every 12 (twelve) hours.  14 tablet  1  . Rivaroxaban (XARELTO) 20 MG TABS Take 1 tablet (20 mg total) by mouth daily.  10 tablet  0  . sodium chloride (OCEAN) 0.65 % nasal spray Place 2 sprays into the nose as needed for congestion. 1 spray into each nostril  15 mL  2  . tadalafil (CIALIS) 20 MG tablet Take 1 tablet (20 mg total) by mouth daily as needed.  6 tablet  1  . traMADol (ULTRAM) 50 MG tablet Take 50 mg by mouth 2 (two) times daily.        [name] 20 MG TABS TAKE ONE TABLET BY  MOUTH EVERY DAY  30 tablet  6   No current facility-administered medications for this visit.    No Known Allergies  Review of Systems negative except from HPI and PMH  Physical Exam BP 144/92  Pulse 87  Ht 6' (1.829 m)  Wt 179 lb (81.194 kg)  BMI 24.27 kg/m2 Well developed and well nourished in no acute distress HENT normal E scleral and icterus clear Neck Supple JVP flat; carotids brisk and full Clear to ausculation Regular rate and rhythm, no murmurs gallops or rub Soft with active bowel sounds No clubbing cyanosis none Edema Alert and oriented, grossly normal motor and sensory function Skin Warm and Dry    Assessment and  Plan

## 2013-02-17 ENCOUNTER — Encounter: Payer: Self-pay | Admitting: Internal Medicine

## 2013-02-20 ENCOUNTER — Encounter: Payer: 59 | Admitting: *Deleted

## 2013-03-02 ENCOUNTER — Encounter: Payer: Self-pay | Admitting: *Deleted

## 2013-03-09 ENCOUNTER — Other Ambulatory Visit: Payer: Self-pay | Admitting: Internal Medicine

## 2013-03-09 ENCOUNTER — Encounter: Payer: Self-pay | Admitting: Internal Medicine

## 2013-03-09 ENCOUNTER — Ambulatory Visit (INDEPENDENT_AMBULATORY_CARE_PROVIDER_SITE_OTHER): Payer: 59 | Admitting: *Deleted

## 2013-03-09 DIAGNOSIS — I495 Sick sinus syndrome: Secondary | ICD-10-CM

## 2013-03-16 LAB — REMOTE PACEMAKER DEVICE
AL AMPLITUDE: 2.8 mv
BAMS-0001: 175 {beats}/min
RV LEAD THRESHOLD: 0.5 V
VENTRICULAR PACING PM: 0

## 2013-04-10 ENCOUNTER — Encounter: Payer: Self-pay | Admitting: *Deleted

## 2013-04-21 ENCOUNTER — Telehealth: Payer: Self-pay | Admitting: Internal Medicine

## 2013-04-21 NOTE — Telephone Encounter (Signed)
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 Debby Bud first avail CPE is in January.   Pt wants to know if Dr. Debby Bud would make an exception and see him for his physical.  Please contact pt to confirm.  Thanks.

## 2013-04-24 NOTE — Telephone Encounter (Signed)
See MD message

## 2013-04-24 NOTE — Telephone Encounter (Signed)
Ok for DOT exam in the next couple of weeks

## 2013-05-16 ENCOUNTER — Ambulatory Visit (INDEPENDENT_AMBULATORY_CARE_PROVIDER_SITE_OTHER): Payer: 59 | Admitting: Internal Medicine

## 2013-05-16 ENCOUNTER — Encounter: Payer: Self-pay | Admitting: Internal Medicine

## 2013-05-16 ENCOUNTER — Other Ambulatory Visit (INDEPENDENT_AMBULATORY_CARE_PROVIDER_SITE_OTHER): Payer: 59

## 2013-05-16 VITALS — BP 120/86 | HR 94 | Temp 97.8°F | Ht 72.0 in | Wt 188.4 lb

## 2013-05-16 DIAGNOSIS — I495 Sick sinus syndrome: Secondary | ICD-10-CM

## 2013-05-16 DIAGNOSIS — E119 Type 2 diabetes mellitus without complications: Secondary | ICD-10-CM

## 2013-05-16 DIAGNOSIS — I10 Essential (primary) hypertension: Secondary | ICD-10-CM

## 2013-05-16 DIAGNOSIS — Z0000 Encounter for general adult medical examination without abnormal findings: Secondary | ICD-10-CM

## 2013-05-16 DIAGNOSIS — I4891 Unspecified atrial fibrillation: Secondary | ICD-10-CM

## 2013-05-16 DIAGNOSIS — M25512 Pain in left shoulder: Secondary | ICD-10-CM

## 2013-05-16 DIAGNOSIS — Z125 Encounter for screening for malignant neoplasm of prostate: Secondary | ICD-10-CM

## 2013-05-16 DIAGNOSIS — Z23 Encounter for immunization: Secondary | ICD-10-CM

## 2013-05-16 LAB — LIPID PANEL
Cholesterol: 140 mg/dL (ref 0–200)
HDL: 38.3 mg/dL — ABNORMAL LOW (ref >39.00)
LDL Cholesterol: 84 mg/dL (ref 0–99)
Triglycerides: 89 mg/dL (ref 0.0–149.0)
VLDL: 17.8 mg/dL (ref 0.0–40.0)

## 2013-05-16 LAB — COMPREHENSIVE METABOLIC PANEL
ALT: 30 U/L (ref 0–53)
AST: 33 U/L (ref 0–37)
Albumin: 3.8 g/dL (ref 3.5–5.2)
Alkaline Phosphatase: 72 U/L (ref 39–117)
CO2: 24 mEq/L (ref 19–32)
Calcium: 9 mg/dL (ref 8.4–10.5)
Chloride: 107 mEq/L (ref 96–112)
Glucose, Bld: 241 mg/dL — ABNORMAL HIGH (ref 70–99)
Potassium: 3.7 mEq/L (ref 3.5–5.1)
Sodium: 138 mEq/L (ref 135–145)
Total Bilirubin: 1.1 mg/dL (ref 0.3–1.2)
Total Protein: 6.4 g/dL (ref 6.0–8.3)

## 2013-05-16 LAB — URINALYSIS, ROUTINE W REFLEX MICROSCOPIC
Bilirubin Urine: NEGATIVE
Hgb urine dipstick: NEGATIVE
Ketones, ur: NEGATIVE
Nitrite: NEGATIVE
Total Protein, Urine: NEGATIVE
Urine Glucose: 250
Urobilinogen, UA: 0.2 (ref 0.0–1.0)
pH: 6 (ref 5.0–8.0)

## 2013-05-16 LAB — PSA: PSA: 0.7 ng/mL (ref 0.10–4.00)

## 2013-05-16 LAB — HEPATIC FUNCTION PANEL
ALT: 30 U/L (ref 0–53)
AST: 33 U/L (ref 0–37)
Bilirubin, Direct: 0.2 mg/dL (ref 0.0–0.3)
Total Bilirubin: 1.1 mg/dL (ref 0.3–1.2)
Total Protein: 6.4 g/dL (ref 6.0–8.3)

## 2013-05-16 LAB — MICROALBUMIN / CREATININE URINE RATIO
Creatinine,U: 128.4 mg/dL
Microalb Creat Ratio: 0.6 mg/g (ref 0.0–30.0)
Microalb, Ur: 0.8 mg/dL (ref 0.0–1.9)

## 2013-05-16 NOTE — Patient Instructions (Signed)
Good to see you.  Your exam is good.  Lab today: blood sugar, prostate cancer screening, cholesterol, kidney function, urinalysis. You will receive a letter with the lab results.  Will refill you medications  Will reorder your medications.  Flu shot today.  License/DOT exam - will be ready for pick-up tomorrow after the urinalysis results are filled in.  Next office visit - 6 months.

## 2013-05-16 NOTE — Progress Notes (Signed)
Subjective:    Patient ID: Michael Snyder, male    DOB: February 04, 1951, 62 y.o.   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  In the interval he has kept his appointments with EP service for maintenance of his pacemaker - last in sept '14. He did have a fall and injured his right shoulder. He went to Urgent Care in [place]: x-ray by his report negative for fracture. He was Rx'd meloxicam and muscles relaxant. He has otherwise been doing well.   Past Medical History  Diagnosis Date  . Alcohol abuse   . 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 Diabetes mellitus, type 2   . Seizure disorder   . 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 History of alcohol abuse    Past Surgical History  Procedure Laterality Date  . Laceration repair      scalp  . Ptdvp      7/08 medtronic DDD   Family History  Problem Relation Age of Onset  . Hypertension Mother   . COPD Mother   . Hypertension Brother   . Hypertension Brother    History   Social History  . Marital Status: Married    Spouse Name: N/A    Number of Children: N/A  . Years of Education: N/A   Occupational History  . Not on file.   Social History Main Topics  . Smoking status: Never Smoker   . Smokeless tobacco: Never Used  . Alcohol Use: No  . Drug Use: No  . Sexual Activity: Yes    Partners: Female   Other Topics Concern  . Not on file   Social History Narrative   12th grade education. H/O substance abuse- now abstemious and works with other in recovery..Attends [employer] on a regular basis and serves as a sponsor. Married 18 years-divorced. Married 5 years 2nd marriage.3 daughters previous marriage, 9 grandchildren, 3 great-grands. Work- Arts administrator             Current Outpatient Prescriptions on File Prior to Visit  Medication Sig Dispense Refill  . Calcium Carbonate-Vitamin D (CALTRATE 600+D) 600-400 MG-UNIT per tablet Take 1 tablet by mouth 2 (two) times daily.        [name]  gabapentin (NEURONTIN) 300 MG capsule Take 1 capsule (300 mg  total) by mouth every 6  (six) hours as needed.  270 capsule  0  . Ginkgo Biloba (GINKOBA) 40 MG TABS Take by mouth 2 (two) times daily.        [name] glipiZIDE (GLUCOTROL) 5 MG tablet Take 1 tablet (5 mg total) by mouth 2 (two) times daily before a meal.  180 tablet  3  . glucose blood (ONETOUCH VERIO IQ) test strip Use as instructed once daily dx 250.00  100 each  3  . ibuprofen (ADVIL,MOTRIN) 800 MG tablet Take 800 mg by mouth every 8 (eight) hours as needed.        [name] lisinopril-hydrochlorothiazide (PRINZIDE,ZESTORETIC) 10-12.5 MG per tablet Take 1 tablet by mouth  every day  90 tablet  0  . MULTIPLE VITAMIN PO Take by mouth daily.        . Omega-3 Fatty Acids (FISH OIL) 300 MG CAPS Take by mouth.        . pseudoephedrine (SUDAFED 12 HOUR) 120 MG 12 hr tablet Take 1 tablet (120 mg total) by mouth every 12 (twelve) hours.  14 tablet  1  . tadalafil (CIALIS) 20 MG tablet Take 1 tablet (20 mg total) by mouth daily as needed.  6 tablet  1  . traMADol (ULTRAM) 50 MG tablet Take 50 mg by mouth 2 (two) times daily.        [name] 20 MG TABS TAKE ONE TABLET BY MOUTH EVERY DAY  30 tablet  6   No current facility-administered medications on file prior to visit.      Review of Systems  Constitutional: Negative for fever, chills and weight loss.  HENT: Negative.   Eyes: Positive for redness. Negative for blurred vision and double vision.       Is current with ophthal: has exam coming up. No cataracts or loss of vision.  Respiratory: Negative.   Cardiovascular: Negative for chest pain, palpitations and leg swelling.  Gastrointestinal: Negative.   Genitourinary: Negative for dysuria and urgency.  Musculoskeletal: Negative.   Skin: Negative.   Neurological: Negative for dizziness, tingling and tremors.  Psychiatric/Behavioral: Negative.    Review of Systems  Constitutional: Negative for fever, chills and weight loss.  HENT: Negative.    Eyes: Positive for redness. Negative for blurred vision and double vision.       Is current with ophthal: has exam coming up. No cataracts or loss of vision.  Respiratory: Negative.   Cardiovascular: Negative for chest pain, palpitations and leg swelling.  Gastrointestinal: Negative.   Genitourinary: Negative for dysuria and urgency.  Musculoskeletal: Negative.   Skin: Negative.   Neurological: Negative for dizziness, tingling and tremors.  Endo/Heme/Allergies: Negative.   Psychiatric/Behavioral: Negative.        Objective:   Physical Exam Filed Vitals:   05/16/13 1010  BP: 120/86  Pulse: 94  Temp: 97.8 F (36.6 C)   Wt Readings from Last 3 Encounters:  05/16/13 188 lb 6.4 oz (85.458 kg)  11/17/12 179 lb (81.194 kg)  10/01/12 175 lb 8 oz (79.606 kg)   Gen'l: Well nourished well developed male in no acute distress  HEENT: Head: Normocephalic and atraumatic. Right Ear: External ear normal. EAC/TM nl. Left Ear: External ear normal.  EAC/TM nl. Nose: Nose normal. Mouth/Throat: Oropharynx is clear and moist. Dentition - native, in good repair. No buccal or palatal lesions. Posterior pharynx clear. Eyes: Conjunctivae and sclera clear. EOM intact. Pupils are equal, round, and reactive to light. Right eye exhibits no discharge. Left eye exhibits no discharge. Neck: Normal range of motion. Neck supple. No JVD present. No tracheal deviation present. No thyromegaly present.  Cardiovascular: Normal rate, regular rhythm, no gallop, no friction rub, no murmur heard.      Quiet precordium. 2+ radial and DP pulses . No carotid bruits Pulmonary/Chest: Effort normal. No respiratory distress or increased WOB, no wheezes, no rales. No chest wall deformity or CVAT. Abdomen: Soft. Bowel sounds are normal in all quadrants. He exhibits no distension, no tenderness, no rebound or guarding, No heptosplenomegaly  Genitourinary: deferred  Musculoskeletal: Normal range of motion. He exhibits no edema and no  tenderness.       Small and large joints without redness, synovial thickening or deformity. Full range of motion preserved about all small, median and large joints.  Lymphadenopathy:    He has no cervical or supraclavicular adenopathy.  Neurological: He is alert and oriented to person, place, and time. CN II-XII intact. DTRs 2+ and symmetrical biceps, radial and patellar tendons. Cerebellar function normal with no tremor, rigidity, normal gait and station.  Skin: Skin is warm and dry. No  rash noted. No erythema.  Psychiatric: He has a normal mood and affect. His behavior is normal. Thought content normal.   Recent Results (from the past 2160 hour(s))  REMOTE PACEMAKER DEVICE     Status: None   Collection Time    03/09/13  8:30 PM      Result Value Range   DEVICE MODEL PM [id number]     [id number] Duke Salvia  M.D.     [id number] Duke Salvia  M.D.     [id number] Medtronic [facility]     [phone]       Value: Pacemaker remote check. Device function reviewed. Impedance, sensing, auto capture thresholds consistent with previous measurements. Histograms appropriate for patient and level of activity. All other diagnostic data reviewed and is appropriate and      stable for patient. Real time/magnet EGM shows appropriate sensing and capture. 4 mode switches--all less than 1 minute. 1 ventricular high rate episode lasting 5 beats. Estimated longevity 3 years. [facility] 06-12-13 and ROV in May with [facility].   ATRIAL PACING PM 11     VENTRICULAR PACING PM 0     BATTERY VOLTAGE 2.74     AL IMPEDENCE PM 493     RV LEAD IMPEDENCE PM 523     AL AMPLITUDE 2.8     RV LEAD AMPLITUDE 8     AL THRESHOLD 0.75     RV LEAD THRESHOLD 0.5     BAMS-0001 175     BAMS-0002 No Delay    LIPID PANEL     Status: Abnormal   Collection Time    05/16/13 11:04 AM      Result Value Range   Cholesterol 140  0 - 200 mg/dL   Comment: ATP III Classification       Desirable:  < 200 mg/dL                Borderline High:  200 - 239 mg/dL          High:  > = 329 mg/dL   Triglycerides 51.8  0.0 - 149.0 mg/dL   Comment: Normal:  <841 mg/dLBorderline High:  150 - 199 mg/dL   HDL 66.06 (*) >30.16 mg/dL   VLDL 01.0  0.0 - 93.2 mg/dL   LDL Cholesterol 84  0 - 99 mg/dL   Total CHOL/HDL Ratio 4     Comment:                Men          Women1/2 Average Risk     3.4          3.3Average Risk          5.0          4.42X Average Risk          9.6          7.13X Average Risk          15.0          11.0                      URINALYSIS, ROUTINE W REFLEX MICROSCOPIC     Status: None   Collection Time    05/16/13 11:04 AM      Result Value Range   Color, Urine YELLOW  Yellow;Lt. Yellow   APPearance CLEAR  Clear   Specific Gravity, Urine >=1.030  1.000 - 1.030   pH 6.0  5.0 -  8.0   Total Protein, Urine NEGATIVE  Negative   Urine Glucose 250  Negative   Ketones, ur NEGATIVE  Negative   Bilirubin Urine NEGATIVE  Negative   Hgb urine dipstick NEGATIVE  Negative   Urobilinogen, UA 0.2  0.0 - 1.0   Leukocytes, UA NEGATIVE  Negative   Nitrite NEGATIVE  Negative   WBC, UA 0-2/hpf  0-2/hpf   RBC / HPF none seen  0-2/hpf   Mucus, UA Presence of  None   Squamous Epithelial / LPF Rare(0-4/hpf)  Rare(0-4/hpf)  MICROALBUMIN / CREATININE URINE RATIO     Status: None   Collection Time    05/16/13 11:04 AM      Result Value Range   Microalb, Ur 0.8  0.0 - 1.9 mg/dL   Creatinine,U 161.0     Microalb Creat Ratio 0.6  0.0 - 30.0 mg/g  COMPREHENSIVE METABOLIC PANEL     Status: Abnormal   Collection Time    05/16/13 11:04 AM      Result Value Range   Sodium 138  135 - 145 mEq/L   Potassium 3.7  3.5 - 5.1 mEq/L   Chloride 107  96 - 112 mEq/L   CO2 24  19 - 32 mEq/L   Glucose, Bld 241 (*) 70 - 99 mg/dL   BUN 15  6 - 23 mg/dL   Creatinine, Ser 1.4  0.4 - 1.5 mg/dL   Total Bilirubin 1.1  0.3 - 1.2 mg/dL   Alkaline Phosphatase 72  39 - 117 U/L   AST 33  0 - 37 U/L   ALT 30  0 - 53 U/L   Total Protein 6.4  6.0 -  8.3 g/dL   Albumin 3.8  3.5 - 5.2 g/dL   Calcium 9.0  8.4 - 96.0 mg/dL   GFR 45.40  >98.11 mL/min  HEPATIC FUNCTION PANEL     Status: None   Collection Time    05/16/13 11:04 AM      Result Value Range   Total Bilirubin 1.1  0.3 - 1.2 mg/dL   Bilirubin, Direct 0.2  0.0 - 0.3 mg/dL   Alkaline Phosphatase 72  39 - 117 U/L   AST 33  0 - 37 U/L   ALT 30  0 - 53 U/L   Total Protein 6.4  6.0 - 8.3 g/dL   Albumin 3.8  3.5 - 5.2 g/dL  PSA     Status: None   Collection Time    05/16/13 11:04 AM      Result Value Range   PSA 0.70  0.10 - 4.00 ng/mL  HEMOGLOBIN A1C     Status: Abnormal   Collection Time    05/16/13 11:04 AM      Result Value Range   Hemoglobin A1C 7.4 (*) 4.6 - 6.5 %   Comment: Glycemic Control Guidelines for People with Diabetes:Non Diabetic:  <6%Goal of Therapy: <7%Additional Action Suggested:  >8%            Assessment & Plan:

## 2013-05-16 NOTE — Progress Notes (Signed)
Pre visit review using our clinic review tool, if applicable. No additional management support is needed unless otherwise documented below in the visit note. 

## 2013-05-17 NOTE — Assessment & Plan Note (Signed)
Last PPM check Sept '14 - doing well. No report of symptoms.  Plan F/u EP as directed.

## 2013-05-17 NOTE — Assessment & Plan Note (Addendum)
A1C 7.4% - above goal but below threshold for med change. Microablumin/cr ration 0.6 - normal. He had normal foot exam. He is current with eye exams.  Plan Continue current medication.  Better dietary adherence

## 2013-05-17 NOTE — Assessment & Plan Note (Signed)
BP Readings from Last 3 Encounters:  05/16/13 120/86  11/17/12 144/92  10/01/12 108/80   Adequate control on present medication. Normal renal function and 'lytes  Plan Continue present regimen

## 2013-05-17 NOTE — Assessment & Plan Note (Addendum)
Interval history is unremarkable. PHysical exam w/o significant abnormality. Lab results reviewed and are in normal range: LDL better than goal, U/A negative. He is current with colorectal cancer screening. Discussed pros and cons of prostate cancer screening (USPHCTF recommendations reviewed and ACU April '13 recommendations) and he requests evaluation at this time - PSA is normal. He is current with immunizations. 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  In summary A nice man with strong recovery who is medically stable at this time.

## 2013-05-19 ENCOUNTER — Encounter: Payer: Self-pay | Admitting: Internal Medicine

## 2013-05-19 DIAGNOSIS — E119 Type 2 diabetes mellitus without complications: Secondary | ICD-10-CM

## 2013-06-12 ENCOUNTER — Ambulatory Visit (INDEPENDENT_AMBULATORY_CARE_PROVIDER_SITE_OTHER): Payer: BC Managed Care – PPO | Admitting: *Deleted

## 2013-06-12 ENCOUNTER — Encounter: Payer: Self-pay | Admitting: Internal Medicine

## 2013-06-12 DIAGNOSIS — I495 Sick sinus syndrome: Secondary | ICD-10-CM

## 2013-06-12 DIAGNOSIS — Z950 Presence of cardiac pacemaker: Secondary | ICD-10-CM

## 2013-06-12 DIAGNOSIS — I498 Other specified cardiac arrhythmias: Secondary | ICD-10-CM

## 2013-06-13 ENCOUNTER — Telehealth: Payer: Self-pay | Admitting: Internal Medicine

## 2013-06-13 ENCOUNTER — Other Ambulatory Visit: Payer: Self-pay | Admitting: Internal Medicine

## 2013-06-13 ENCOUNTER — Encounter: Payer: Self-pay | Admitting: *Deleted

## 2013-06-13 NOTE — Telephone Encounter (Signed)
Talked to patient around 2:45 pm, let him know Dr. Graciela Husbands said no cardiac reason that he couldn't drive. Faxed approval to Dr. Alwyn Ren at [facility] who requested Klein's clearance for pt CDLs. Patient thankful.

## 2013-06-13 NOTE — Telephone Encounter (Signed)
Follow Up   Pt left a note at the front office for Dr. Graciela Husbands to get in contact with Dr. Sandria Bales. [place] at urgent care on [address] to confirm that it is safe for the pt to drive// Pt states that he received a call back informing him to call the office by 2 pm this afternoon. He states that it is urgent that this is completed today.. Please assist.

## 2013-06-21 LAB — MDC_IDC_ENUM_SESS_TYPE_REMOTE
Battery Impedance: 1357 Ohm
Brady Statistic AP VP Percent: 0 %
Brady Statistic AP VS Percent: 9 %
Brady Statistic AS VS Percent: 90 %
Lead Channel Impedance Value: 563 Ohm
Lead Channel Pacing Threshold Amplitude: 0.625 V
Lead Channel Sensing Intrinsic Amplitude: 2.8 mV
Lead Channel Sensing Intrinsic Amplitude: 8 mV
Lead Channel Setting Pacing Amplitude: 2 V
Lead Channel Setting Pacing Pulse Width: 0.4 ms
Lead Channel Setting Sensing Sensitivity: 2 mV

## 2013-07-03 ENCOUNTER — Encounter: Payer: Self-pay | Admitting: *Deleted

## 2013-07-12 ENCOUNTER — Other Ambulatory Visit: Payer: Self-pay | Admitting: *Deleted

## 2013-07-12 ENCOUNTER — Telehealth: Payer: Self-pay | Admitting: *Deleted

## 2013-07-12 DIAGNOSIS — E119 Type 2 diabetes mellitus without complications: Secondary | ICD-10-CM

## 2013-07-12 MED ORDER — RIVAROXABAN 20 MG PO TABS: ORAL_TABLET | ORAL | Status: DC

## 2013-07-12 MED ORDER — GLUCOSE BLOOD VI STRP: ORAL_STRIP | Status: DC

## 2013-07-12 MED ORDER — TADALAFIL 20 MG PO TABS: 20.0000 mg | ORAL_TABLET | Freq: Every day | ORAL | Status: DC | PRN

## 2013-07-12 MED ORDER — GLIPIZIDE 5 MG PO TABS: 5.0000 mg | ORAL_TABLET | Freq: Two times a day (BID) | ORAL | Status: DC

## 2013-07-12 MED ORDER — CALCIUM CARBONATE-VITAMIN D 600-400 MG-UNIT PO TABS: 1.0000 | ORAL_TABLET | Freq: Two times a day (BID) | ORAL | Status: AC

## 2013-07-12 MED ORDER — GINKGO BILOBA 40 MG PO TABS: 40.0000 mg | ORAL_TABLET | Freq: Two times a day (BID) | ORAL | Status: DC

## 2013-07-12 MED ORDER — FISH OIL 300 MG PO CAPS: 300.0000 mg | ORAL_CAPSULE | Freq: Every day | ORAL | Status: AC

## 2013-07-12 MED ORDER — TRAMADOL HCL 50 MG PO TABS: 50.0000 mg | ORAL_TABLET | Freq: Two times a day (BID) | ORAL | Status: DC

## 2013-07-12 MED ORDER — MELOXICAM 15 MG PO TABS: 15.0000 mg | ORAL_TABLET | Freq: Every day | ORAL | Status: DC

## 2013-07-12 MED ORDER — IBUPROFEN 800 MG PO TABS: 800.0000 mg | ORAL_TABLET | Freq: Three times a day (TID) | ORAL | Status: DC | PRN

## 2013-07-12 MED ORDER — LISINOPRIL-HYDROCHLOROTHIAZIDE 10-12.5 MG PO TABS: ORAL_TABLET | ORAL | Status: DC

## 2013-07-12 MED ORDER — GABAPENTIN 300 MG PO CAPS: ORAL_CAPSULE | ORAL | Status: DC

## 2013-07-12 NOTE — Telephone Encounter (Signed)
Please send RX for all his medications to [facility] on [address] - 90 day supply with 3 refills

## 2013-07-12 NOTE — Telephone Encounter (Signed)
Patient states that his insurance company has changed to [employer] & he has also has to [facility] [address].  He is not going to be able to receive the refill sent into [facility] yesterday. He needs all of his meds re-sent to the newly added [facility] pharmacy.  Your instructions stated no further refills, etc until appointment was scheduled.  He recently saw you for routine physical 05/16/13.  He is scheduled to see you on Monday, January 12th.  Please advise.  CB# [phone]

## 2013-07-12 NOTE — Telephone Encounter (Signed)
Refilled all meds & escribed to pt pharmacy per Dr. Debby BudNorins instructions.

## 2013-07-14 ENCOUNTER — Other Ambulatory Visit: Payer: Self-pay | Admitting: *Deleted

## 2013-07-14 DIAGNOSIS — E119 Type 2 diabetes mellitus without complications: Secondary | ICD-10-CM

## 2013-07-14 NOTE — Telephone Encounter (Signed)
Notified patient his requested refills had been sent to pharmacy of his choice.

## 2013-07-17 ENCOUNTER — Ambulatory Visit (INDEPENDENT_AMBULATORY_CARE_PROVIDER_SITE_OTHER): Payer: BC Managed Care – PPO | Admitting: Internal Medicine

## 2013-07-17 ENCOUNTER — Encounter: Payer: Self-pay | Admitting: Internal Medicine

## 2013-07-17 VITALS — BP 142/92 | HR 73 | Temp 97.4°F | Wt 184.4 lb

## 2013-07-17 DIAGNOSIS — Z760 Encounter for issue of repeat prescription: Secondary | ICD-10-CM

## 2013-07-17 NOTE — Progress Notes (Signed)
Pre visit review using our clinic review tool, if applicable. No additional management support is needed unless otherwise documented below in the visit note. 

## 2013-07-17 NOTE — Progress Notes (Signed)
   Subjective:    Patient ID: Michael Snyder, male    DOB: 09/03/1950, 63 y.o.   MRN: [id number]  HPI Scheduled in error: at last refill of cialis he was told he needed a physical - had one on November '14. No charge - copay refunded.    Review of Systems     Objective:   Physical Exam        Assessment & Plan:

## 2013-08-21 ENCOUNTER — Telehealth: Payer: Self-pay | Admitting: *Deleted

## 2013-08-21 NOTE — Telephone Encounter (Signed)
[facility] Triage Call Report Triage Record Num: [id number] Operator: Claudie LeachWanda Tate Patient Name: Michael BanasWalter Snyder Call Date & Time: 08/19/2013 7:01:21PM Patient Phone: [phone] PCP: Illene RegulusMichael Norins Patient Gender: Male PCP Fax : [phone] Patient DOB: 10/15/1950 [facility] Name: [facility] Reason for Call: Caller: Ivory/Patient; PCP: Illene RegulusNorins, Michael (Adults only); CB#: [phone]; Call regarding abdominal pain lower left side; Started on 08/18/13. States the pain is unbearable. Triaged per Abdominal Pain guideline. To activate EMS 911 due to over 64 years of age and new onset of unbearable back or abdominal pain. Care advice given. Advised 911 and caller states he will call them. Protocol(s) Used: Abdominal Pain Recommended Outcome per Protocol: Activate EMS 911 Reason for Outcome: Over 64 years of age AND new onset (first episode) unbearable back or abdominal pain Care Advice: ~ Do not give the patient anything to eat or drink. ~ Do not push on abdomen. ~ IMMEDIATE ACTION Write down provider's name. List or place the following in a bag for transport with the patient: current prescription and/or nonprescription medications; alternative treatments, therapies and medications; and street drugs. ~ 02/

## 2013-08-21 NOTE — Telephone Encounter (Signed)
[facility] Triage Call Report Triage Record Num: [id number] Operator: Lesli Albeeracey McKinney Patient Name: Cherylann BanasWalter Abdullah Call Date & Time: 08/19/2013 3:42:54PM Patient Phone: [phone] PCP: Illene RegulusMichael Norins Patient Gender: Male PCP Fax : [phone]) [phone] Patient DOB: 04/20/1951 Practice Name: [facility] Reason for Call: Caller: Jontae/Patient; PCP: Illene RegulusNorins, Michael (Adults only); CB#: [phone]; Call regarding Abdominal pain; Pt is calling about having abdominal pain since yesterday. He has vomited x 2. He only drank water. Pt states the pain is on his left lower abdomen. It is a 9 out of a 10. RN triaged per abdominal pain and advised ED. Pt refused. The co-pay is too high. RN advised UC. Pt will go to [facility]. Protocol(s) Used: Abdominal Pain Recommended Outcome per Protocol: See ED Immediately Reason for Outcome: Unbearable abdominal/pelvic pain Care Advice: Call EMS 911 if signs and symptoms of shock develop (such as unable to stand due to faintness, dizziness, or lightheadedness; new onset of confusion; slow to respond or difficult to awaken; skin is pale, gray, cool, or moist to touch; severe weakness; loss of consciousness). ~ ~ IMMEDIATE ACTION 02/

## 2013-08-25 ENCOUNTER — Telehealth: Payer: Self-pay | Admitting: *Deleted

## 2013-08-25 NOTE — Telephone Encounter (Signed)
Patient would like to discuss some concerns he is having about xarelto with you, it is in regards to something he saw on tv about the medication. Please advise. Thanks, MI

## 2013-08-25 NOTE — Telephone Encounter (Signed)
Discussed  Pt concerns with what he saw on commercial. Pt has been taking this medication almost a year (per his statement) with no side effect/problems. Pt thankful for information and feels better after our discussion, and comfortable continuing medication.

## 2013-09-13 ENCOUNTER — Ambulatory Visit (INDEPENDENT_AMBULATORY_CARE_PROVIDER_SITE_OTHER): Payer: BC Managed Care – PPO | Admitting: *Deleted

## 2013-09-13 DIAGNOSIS — I495 Sick sinus syndrome: Secondary | ICD-10-CM

## 2013-09-13 DIAGNOSIS — Z950 Presence of cardiac pacemaker: Secondary | ICD-10-CM

## 2013-09-14 LAB — MDC_IDC_ENUM_SESS_TYPE_REMOTE
Battery Impedance: 1444 Ohm
Battery Voltage: 2.73 V
Brady Statistic AP VS Percent: 9 %
Brady Statistic AS VP Percent: 1 %
Brady Statistic AS VS Percent: 90 %
Date Time Interrogation Session: 20150312030311
Lead Channel Impedance Value: 489 Ohm
Lead Channel Pacing Threshold Amplitude: 0.625 V
Lead Channel Pacing Threshold Amplitude: 0.625 V
Lead Channel Pacing Threshold Pulse Width: 0.4 ms
Lead Channel Pacing Threshold Pulse Width: 0.4 ms
Lead Channel Sensing Intrinsic Amplitude: 0.7 mV
Lead Channel Setting Pacing Amplitude: 2.5 V
Lead Channel Setting Pacing Pulse Width: 0.4 ms
Lead Channel Setting Sensing Sensitivity: 2 mV
MDC IDC MSMT BATTERY REMAINING LONGEVITY: 35 mo
MDC IDC MSMT LEADCHNL RA IMPEDANCE VALUE: 442 Ohm
MDC IDC MSMT LEADCHNL RV SENSING INTR AMPL: 5.6 mV
MDC IDC SET LEADCHNL RA PACING AMPLITUDE: 2 V
MDC IDC STAT BRADY AP VP PERCENT: 0 %

## 2013-10-27 ENCOUNTER — Telehealth: Payer: Self-pay | Admitting: Internal Medicine

## 2013-10-27 MED ORDER — LISINOPRIL-HYDROCHLOROTHIAZIDE 10-12.5 MG PO TABS: ORAL_TABLET | ORAL | Status: DC

## 2013-10-27 NOTE — Telephone Encounter (Signed)
Pt is on Dr. Lavonna MonarchKollar's list for the fall.  He needs a refill on Lisinopril.

## 2013-10-27 NOTE — Telephone Encounter (Signed)
RX refilled x 1 until appointment with new MD

## 2013-11-02 ENCOUNTER — Encounter: Payer: Self-pay | Admitting: *Deleted

## 2013-11-17 ENCOUNTER — Encounter: Payer: Self-pay | Admitting: Internal Medicine

## 2013-11-29 ENCOUNTER — Ambulatory Visit (INDEPENDENT_AMBULATORY_CARE_PROVIDER_SITE_OTHER): Payer: BC Managed Care – PPO | Admitting: Internal Medicine

## 2013-11-29 ENCOUNTER — Encounter: Payer: Self-pay | Admitting: Internal Medicine

## 2013-11-29 VITALS — BP 129/86 | HR 72 | Ht 72.0 in | Wt 182.0 lb

## 2013-11-29 DIAGNOSIS — I495 Sick sinus syndrome: Secondary | ICD-10-CM

## 2013-11-29 DIAGNOSIS — Z950 Presence of cardiac pacemaker: Secondary | ICD-10-CM

## 2013-11-29 DIAGNOSIS — I4891 Unspecified atrial fibrillation: Secondary | ICD-10-CM

## 2013-11-29 MED ORDER — RIVAROXABAN 20 MG PO TABS: ORAL_TABLET | ORAL | Status: DC

## 2013-11-29 NOTE — Patient Instructions (Signed)
Your physician recommends that you continue on your current medications as directed. Please refer to the Current Medication list given to you today.  Your physician has requested that you have an echocardiogram. Echocardiography is a painless test that uses sound waves to create images of your heart. It provides your doctor with information about the size and shape of your heart and how well your heart's chambers and valves are working. This procedure takes approximately one hour. There are no restrictions for this procedure.  Remote monitoring is used to monitor your Pacemaker of ICD from home. This monitoring reduces the number of office visits required to check your device to one time per year. It allows [place] to keep an eye on the functioning of your device to ensure it is working properly. You are scheduled for a device check from home on 03/06/14. You may send your transmission at any time that day. If you have a wireless device, the transmission will be sent automatically. After your physician reviews your transmission, you will receive a postcard with your next transmission date.  Your physician wants you to follow-up in: 1 year with Rick Duff, PAC.  You will receive a reminder letter in the mail two months in advance. If you don't receive a letter, please call our office to schedule the follow-up appointment.

## 2013-11-29 NOTE — Progress Notes (Signed)
Patient Care Team: Michael NavyMichael E Norins, MD as PCP - General   HPI  Michael BanasWalter Snyder is a 63 y.o. male seen in followup for pacemaker implanted 2008 because of symptomatic sinus node dysfunction and profound nocturnal bradycardia not felt to be related to sleep apnea. He has had no further symptoms of lightheadedness following device implantation.   The patient denies SOB, chest pain, edema or palpitations    Evaluation of his left ventricular function by echo in 2008 demonstrated an EF of 55-65%.   And he has a history of a retinal event that was thought to be possibly thromboembolic. He had atrial high rate episodes detected on his device and the concern was that this represented atrial fibrillation. He was started on Rivaroxaban which he is tolerating; renal function was normal 11/14      Past Medical History  Diagnosis Date  . Alcohol abuse   . 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 Diabetes mellitus, type 2   . Seizure disorder   . Pacemaker     Medtronic Adapta [id number]  . History of alcohol abuse     Past Surgical History  Procedure Laterality Date  . Laceration repair      scalp  . 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 Calcium Carbonate-Vitamin D (CALTRATE 600+D) 600-400 MG-UNIT per tablet Take 1 tablet by mouth 2 (two) times daily.  180 tablet  3  . gabapentin (NEURONTIN) 300 MG capsule Take 1 capsule (300 mg  total) by mouth every 6  (six) hours as needed.  360 capsule  3  . Ginkgo Biloba (GINKOBA) 40 MG TABS Take 1 tablet (40 mg total) by mouth 2 (two) times daily.  180 each  3  . glipiZIDE (GLUCOTROL) 5 MG tablet Take 1 tablet (5 mg total) by mouth 2 (two) times daily before a meal.  180 tablet  3  . glucose blood (ONETOUCH VERIO IQ) test strip Use as instructed once daily dx 250.00  100 each  3  . ibuprofen (ADVIL,MOTRIN) 800 MG tablet Take 1 tablet (800 mg total) by mouth every 8 (eight)  hours as needed.  270 tablet  3  . lisinopril-hydrochlorothiazide (PRINZIDE,ZESTORETIC) 10-12.5 MG per tablet Take 1 tablet by mouth  every day  90 tablet  0  . meloxicam (MOBIC) 15 MG tablet Take 1 tablet (15 mg total) by mouth daily.  90 tablet  3  . MULTIPLE VITAMIN PO Take by mouth daily.        . Omega-3 Fatty Acids (FISH OIL) 300 MG CAPS Take 1 capsule (300 mg total) by mouth daily.  90 capsule  3  . pseudoephedrine (SUDAFED 12 HOUR) 120 MG 12 hr tablet Take 1 tablet (120 mg total) by mouth every 12 (twelve) hours.  14 tablet  1  . Rivaroxaban (XARELTO) 20 MG TABS tablet TAKE ONE TABLET BY MOUTH EVERY DAY  90 tablet  3  . tadalafil (CIALIS) 20 MG tablet Take 1 tablet (20 mg total) by mouth daily as needed.  90 tablet  0  . traMADol (ULTRAM) 50 MG tablet Take 1 tablet (50 mg total) by mouth 2 (two) times daily.  180 tablet  0   No current facility-administered medications for this visit.    No Known Allergies  Review of Systems negative except from HPI and PMH  Physical Exam  BP 129/86  Pulse 72  Ht 6' (1.829 m)  Wt 182 lb (82.555 kg)  BMI 24.68 kg/m2 Well developed and well nourished in no acute distress HENT normal E scleral and icterus clear Neck Supple JVP flat; carotids brisk and full Clear to ausculation Device pocket well healed; without hematoma or erythema.  There is no tetheringRegular rate and rhythm, no murmurs gallops or rub Soft with active bowel sounds No clubbing cyanosis  Edema Alert and oriented, grossly normal motor and sensory function Skin Warm and Dry  ECG demonstrates sinus rhythm at 72 intervals 29/09/36     Assessment and  Plan  Sinus bradycardia  Pacemaker-Medtronic The patient's device was interrogated.  The information was reviewed. No changes were made in the programming.     Ventricular tachycardia  He is doing quite well symptomatically. He is not aware of his hemoglobin A1c. Last recorded blood sugar was 201.  There is an episode of  nonsustained ventricular tachycardia on his device interrogation. We'll obtain an echo to look for any changes in ventricular systolic function.

## 2013-12-15 ENCOUNTER — Ambulatory Visit (HOSPITAL_COMMUNITY): Payer: BC Managed Care – PPO | Attending: Cardiovascular Disease | Admitting: Radiology

## 2013-12-15 DIAGNOSIS — I4891 Unspecified atrial fibrillation: Secondary | ICD-10-CM

## 2013-12-15 DIAGNOSIS — I079 Rheumatic tricuspid valve disease, unspecified: Secondary | ICD-10-CM | POA: Insufficient documentation

## 2013-12-15 DIAGNOSIS — I495 Sick sinus syndrome: Secondary | ICD-10-CM

## 2013-12-15 NOTE — Progress Notes (Signed)
Echocardiogram performed.  

## 2013-12-21 ENCOUNTER — Telehealth: Payer: Self-pay | Admitting: Internal Medicine

## 2013-12-21 NOTE — Telephone Encounter (Signed)
Explained that Dr. Graciela HusbandsKlein had not reviewed echo yet. Also explained that it would be next week before addressed. Pt is agreeable to this.

## 2013-12-21 NOTE — Telephone Encounter (Signed)
New Message: ° °Pt is calling to hear his recent test results °

## 2013-12-27 ENCOUNTER — Telehealth: Payer: Self-pay | Admitting: Internal Medicine

## 2013-12-27 DIAGNOSIS — Z950 Presence of cardiac pacemaker: Secondary | ICD-10-CM

## 2013-12-27 NOTE — Telephone Encounter (Signed)
New message ° ° ° ° ° ° ° ° °Pt calling for results °

## 2013-12-27 NOTE — Telephone Encounter (Signed)
Spoke with pt and told him that per Dr Graciela HusbandsKlein, he had a normal echo but there are some concerns about the location of the pacemaker lead, and that it has possibly moved.  Per Dr Graciela HusbandsKlein, he advised this pt to have a 2v CXR.  Future order placed in epic and sent to [facility].  Gave pt this info and provided directions. Pt verbalized understanding and agrees with this plan.  Will forward this message to Dr Odessa FlemingKlein's nurse to follow-up with.

## 2013-12-29 ENCOUNTER — Ambulatory Visit
Admission: RE | Admit: 2013-12-29 | Discharge: 2013-12-29 | Disposition: A | Discharge status: Home / Self Care | Payer: BC Managed Care – PPO | Source: Ambulatory Visit | Attending: Internal Medicine | Admitting: Internal Medicine

## 2013-12-29 DIAGNOSIS — Z950 Presence of cardiac pacemaker: Secondary | ICD-10-CM

## 2014-01-01 ENCOUNTER — Encounter: Payer: Self-pay | Admitting: Internal Medicine

## 2014-01-01 NOTE — Telephone Encounter (Signed)
Patient would like test results. Please call and advise.  °

## 2014-01-02 NOTE — Telephone Encounter (Signed)
This encounter was created in error - please disregard.

## 2014-01-04 ENCOUNTER — Ambulatory Visit (INDEPENDENT_AMBULATORY_CARE_PROVIDER_SITE_OTHER): Payer: BC Managed Care – PPO | Admitting: Internal Medicine

## 2014-01-04 ENCOUNTER — Encounter: Payer: Self-pay | Admitting: Internal Medicine

## 2014-01-04 VITALS — BP 131/81 | HR 92 | Ht 72.0 in | Wt 177.0 lb

## 2014-01-04 DIAGNOSIS — I495 Sick sinus syndrome: Secondary | ICD-10-CM

## 2014-01-04 DIAGNOSIS — Z950 Presence of cardiac pacemaker: Secondary | ICD-10-CM

## 2014-01-04 LAB — MDC_IDC_ENUM_SESS_TYPE_INCLINIC
Battery Impedance: 1650 Ohm
Battery Voltage: 2.73 V
Brady Statistic AP VS Percent: 13 %
Brady Statistic AS VP Percent: 1 %
Brady Statistic AS VS Percent: 86 %
Date Time Interrogation Session: 20150702130608
Lead Channel Impedance Value: 429 Ohm
Lead Channel Pacing Threshold Amplitude: 0.75 V
Lead Channel Pacing Threshold Pulse Width: 0.4 ms
Lead Channel Pacing Threshold Pulse Width: 0.4 ms
Lead Channel Sensing Intrinsic Amplitude: 1 mV
Lead Channel Setting Pacing Amplitude: 2 V
Lead Channel Setting Pacing Amplitude: 2.5 V
Lead Channel Setting Sensing Sensitivity: 2 mV
MDC IDC MSMT BATTERY REMAINING LONGEVITY: 31 mo
MDC IDC MSMT LEADCHNL RA PACING THRESHOLD AMPLITUDE: 0.75 V
MDC IDC MSMT LEADCHNL RV IMPEDANCE VALUE: 496 Ohm
MDC IDC MSMT LEADCHNL RV SENSING INTR AMPL: 4 mV
MDC IDC SET LEADCHNL RV PACING PULSEWIDTH: 0.4 ms
MDC IDC STAT BRADY AP VP PERCENT: 0 %

## 2014-01-04 NOTE — Progress Notes (Signed)
Patient Care Team: Jacques NavyMichael E Norins, MD as PCP - General   HPI  Cherylann BanasWalter Snyder is a 63 y.o. male seen in followup for pacemaker implanted 2008 because of symptomatic sinus node dysfunction and profound nocturnal bradycardia not felt to be related to sleep apnea. He has had no further symptoms of lightheadedness following device implantation.   The patient denies SOB, chest pain, edema or palpitations    Evaluation of his left ventricular function by echo in 2008 demonstrated an EF of 55-65%.   Nonsustained ventricular tachycardia was noted on his device. We undertook an echo today demonstrated that the excess looping of the lead had migrated from the innominate vein and the right atrium the right ventricle. There is now a of lead in the right ventricle.  And he has a history of a retinal event that was thought to be possibly thromboembolic. He had atrial high rate episodes detected on his device and the concern was that this represented atrial fibrillation. He was started on Rivaroxaban which he is tolerating; renal function was normal 11/14      Past Medical History  Diagnosis Date  . Alcohol abuse   . 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 Diabetes mellitus, type 2   . Seizure disorder   . Pacemaker     Medtronic Adapta [id number]  . History of alcohol abuse     Past Surgical History  Procedure Laterality Date  . Laceration repair      scalp  . 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 Calcium Carbonate-Vitamin D (CALTRATE 600+D) 600-400 MG-UNIT per tablet Take 1 tablet by mouth 2 (two) times daily.  180 tablet  3  . gabapentin (NEURONTIN) 300 MG capsule Take 1 capsule (300 mg  total) by mouth every 6  (six) hours as needed.  360 capsule  3  . Ginkgo Biloba (GINKOBA) 40 MG TABS Take 1 tablet (40 mg total) by mouth 2 (two) times daily.  180 each  3  . glipiZIDE (GLUCOTROL) 5 MG tablet Take 1 tablet  (5 mg total) by mouth 2 (two) times daily before a meal.  180 tablet  3  . glucose blood (ONETOUCH VERIO IQ) test strip Use as instructed once daily dx 250.00  100 each  3  . ibuprofen (ADVIL,MOTRIN) 800 MG tablet Take 1 tablet (800 mg total) by mouth every 8 (eight) hours as needed.  270 tablet  3  . lisinopril-hydrochlorothiazide (PRINZIDE,ZESTORETIC) 10-12.5 MG per tablet Take 1 tablet by mouth  every day  90 tablet  0  . meloxicam (MOBIC) 15 MG tablet Take 1 tablet (15 mg total) by mouth daily.  90 tablet  3  . MULTIPLE VITAMIN PO Take by mouth daily.        . Omega-3 Fatty Acids (FISH OIL) 300 MG CAPS Take 1 capsule (300 mg total) by mouth daily.  90 capsule  3  . pseudoephedrine (SUDAFED 12 HOUR) 120 MG 12 hr tablet Take 1 tablet (120 mg total) by mouth every 12 (twelve) hours.  14 tablet  1  . rivaroxaban (XARELTO) 20 MG TABS tablet TAKE ONE TABLET BY MOUTH EVERY DAY  90 tablet  3  . traMADol (ULTRAM) 50 MG tablet Take 1 tablet (50 mg total) by mouth 2 (two) times daily.  180 tablet  0   No current facility-administered medications  for this visit.    No Known Allergies  Review of Systems negative except from HPI and PMH  Physical Exam BP 131/81  Pulse 92  Ht 6' (1.829 m)  Wt 177 lb (80.287 kg)  BMI 24.00 kg/m2 Well developed and well nourished in no acute distress HENT normal E scleral and icterus clear Neck Supple JVP flat; carotids brisk and full Clear to ausculation Device pocket well healed; without hematoma or erythema.  There is no tetheringRegular rate and rhythm, no murmurs gallops or rub Soft with active bowel sounds No clubbing cyanosis  Edema Alert and oriented, grossly normal motor and sensory function Skin Warm and Dry  ECG demonstrates sinus rhythm at 72 intervals 29/09/36     Assessment and  Plan  Sinus bradycardia  Pacemaker-Medtronic The patient's device was interrogated.  The information was reviewed. No changes were made in the programming.     Ventricular tachycardia  The patient has nonsustained ventricular tachycardia  which based on the looping identify of the RV pacing lead may be mechanical. I am also concerned about the potential implications to cardiac performance and valvular function given the displacement of the lead in the looping in the RVOt  I will review with Dr. Leonia ReevesGT strategies as to the repositioning. Given the fact that was implanted 2008 I'm concerned about binding in the innominate vein and wonder whether extraction isn't the most appropriate strategy.

## 2014-01-04 NOTE — Patient Instructions (Addendum)
Dr. Graciela HusbandsKlein will speak with Dr. Ladona Ridgelaylor and we will contact you about a plan of care.  Please send [employer] Smart transmission upon arrival of monitor then complete survey. Send second transmission one month after the first is sent then complete second survey.  Remote monitoring is used to monitor your pacemaker from home. This monitoring reduces the number of office visits required to check your device to one time per year. It allows us to keep an eye on the functioning of your device to ensure it is working properly. You are scheduled for a device check from home on 04-09-2014. You may send your transmission at any time that day. If you have a wireless device, the transmission will be sent automatically. After your physician reviews your transmission, you will receive a postcard with your next transmission date.   Your physician recommends that you schedule a follow-up appointment in: 12 months with Dr.Klein

## 2014-01-25 ENCOUNTER — Encounter: Payer: BC Managed Care – PPO | Admitting: Internal Medicine

## 2014-02-09 ENCOUNTER — Other Ambulatory Visit: Payer: Self-pay | Admitting: *Deleted

## 2014-02-09 MED ORDER — LISINOPRIL-HYDROCHLOROTHIAZIDE 10-12.5 MG PO TABS: ORAL_TABLET | ORAL | Status: DC

## 2014-03-06 ENCOUNTER — Encounter: Payer: Self-pay | Admitting: Internal Medicine

## 2014-03-06 ENCOUNTER — Ambulatory Visit (INDEPENDENT_AMBULATORY_CARE_PROVIDER_SITE_OTHER): Payer: BC Managed Care – PPO | Admitting: Internal Medicine

## 2014-03-06 VITALS — BP 108/80 | HR 73 | Ht 72.0 in | Wt 175.2 lb

## 2014-03-06 DIAGNOSIS — T82111A Breakdown (mechanical) of cardiac pulse generator (battery), initial encounter: Secondary | ICD-10-CM | POA: Insufficient documentation

## 2014-03-06 DIAGNOSIS — T889XXS Complication of surgical and medical care, unspecified, sequela: Secondary | ICD-10-CM

## 2014-03-06 DIAGNOSIS — T82111S Breakdown (mechanical) of cardiac pulse generator (battery), sequela: Secondary | ICD-10-CM

## 2014-03-06 DIAGNOSIS — I495 Sick sinus syndrome: Secondary | ICD-10-CM

## 2014-03-06 NOTE — Patient Instructions (Addendum)
Please call office at [phone] once reviewed dates and ready to proceed.  Dates available: September: 14, 16, 18,23 October: 1, 5, 9, 19, 23, 26, 30

## 2014-03-06 NOTE — Assessment & Plan Note (Signed)
I reviewed the situation with the patient. His ventricular lead has withdrawn. There is a large amount of slack in the atrium. While the device is currently working normally, there is high chance of subsequent lead dysfunction and the possibility of perforation. I recommended the patient undergo pacemaker lead revision with an extraction of his right ventricular lead, reinsertion of a new ventricular lead, followed by insertion of a new dual-chamber pacemaker as he is approximately 2 years from elective replacement and has had his device for approximately 8 years. The risk, goals, benefits, and expectations of this procedure have been discussed with the patient. He will consider his options and will call us when he would like to proceed with this procedure.

## 2014-03-06 NOTE — Progress Notes (Signed)
HPI Michael Snyder is referred today by Dr. Graciela Husbands for evaluation of a prior ventricular lead dislocation. He is a very pleasant 63 year old man with a history of symptomatic sinus node dysfunction, status post permanent pacemaker insertion. The patient has been otherwise healthy. He does take systemic anticoagulation for paroxysmal atrial fibrillation. He had increasing palpitations, and PVCs, and a chest x-ray demonstrated that his ventricular lead appears to have dislodged and then reimplanted near the base of the right ventricle near the tricuspid valve annulus. The patient's lead does pace appropriately. Review of his chest x-ray demonstrates a very large loop is present around the region of the right atrium/tricuspid valve. He denies fever or chills. He denies any history of trauma. He has not had syncope. No Known Allergies   Current Outpatient Prescriptions  Medication Sig Dispense Refill  . ALPHAGAN P 0.1 % SOLN Place 2 drops into the left eye daily.      . Calcium Carbonate-Vitamin D (CALTRATE 600+D) 600-400 MG-UNIT per tablet Take 1 tablet by mouth 2 (two) times daily.  180 tablet  3  . gabapentin (NEURONTIN) 300 MG capsule Take 300 mg by mouth every 6 (six) hours as needed (pain).       [name] glipiZIDE (GLUCOTROL) 5 MG tablet Take 1 tablet (5 mg total) by mouth 2 (two) times daily before a meal.  180 tablet  3  . lisinopril-hydrochlorothiazide (PRINZIDE,ZESTORETIC) 10-12.5 MG per tablet Take 1 tablet by mouth  every day  90 tablet  0  . meloxicam (MOBIC) 15 MG tablet Take 1 tablet (15 mg total) by mouth daily.  90 tablet  3  . MULTIPLE VITAMIN PO Take 1 capsule by mouth daily.       . Omega-3 Fatty Acids (FISH OIL) 300 MG CAPS Take 1 capsule (300 mg total) by mouth daily.  90 capsule  3  . rivaroxaban (XARELTO) 20 MG TABS tablet TAKE ONE TABLET BY MOUTH EVERY DAY  90 tablet  3   No current facility-administered medications for this visit.     Past Medical History  Diagnosis Date    . Alcohol abuse   . 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 Diabetes mellitus, type 2   . Seizure disorder   . 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 History of alcohol abuse     ROS:   All systems reviewed and negative except as noted in the HPI.   Past Surgical History  Procedure Laterality Date  . Laceration repair      scalp  . Ptdvp      7/08 medtronic DDD     Family History  Problem Relation Age of Onset  . Hypertension Mother   . COPD Mother   . Hypertension Brother   . Hypertension Brother      History   Social History  . Marital Status: Married    Spouse Name: N/A    Number of Children: N/A  . Years of Education: N/A   Occupational History  . Not on file.   Social History Main Topics  . Smoking status: Never Smoker   . Smokeless tobacco: Never Used  . Alcohol Use: No  . Drug Use: No  . Sexual Activity: Yes    Partners: Female   Other Topics Concern  . Not on file   Social History Narrative   12th grade education. H/O substance abuse- now abstemious and works with other in  recovery..Attends [employer] on a regular basis and serves as a sponsor. Married 18 years-divorced. Married 5 years 2nd marriage.3 daughters previous marriage, 9 grandchildren, 3 great-grands. Work- Arts administrator              BP 295/62  Pulse 73  Ht 6' (1.829 m)  Wt 175 lb 3.2 oz (79.47 kg)  BMI 23.76 kg/m2  Physical Exam:  Well appearing 63 year old man, NAD HEENT: Unremarkable Neck:  No JVD, no thyromegally Back:  No CVA tenderness Lungs:  Clear with no wheezes, rales, or rhonchi. HEART:  Regular rate rhythm, no murmurs, no rubs, no clicks Abd:  soft, positive bowel sounds, no organomegally, no rebound, no guarding Ext:  2 plus pulses, no edema, no cyanosis, no clubbing Skin:  No rashes no nodules Neuro:  CN II through XII intact, motor grossly intact  Chest x-ray - reviewed  DEVICE  Normal device function.  See PaceArt for details.    Assess/Plan:

## 2014-03-07 LAB — MDC_IDC_ENUM_SESS_TYPE_INCLINIC
Battery Impedance: 1797 Ohm
Battery Voltage: 2.72 V
Brady Statistic AP VP Percent: 1 %
Brady Statistic AP VS Percent: 17 %
Brady Statistic AS VP Percent: 1 %
Date Time Interrogation Session: 20150901185651
Lead Channel Impedance Value: 461 Ohm
Lead Channel Impedance Value: 492 Ohm
Lead Channel Pacing Threshold Amplitude: 0.5 V
Lead Channel Pacing Threshold Pulse Width: 0.4 ms
Lead Channel Sensing Intrinsic Amplitude: 4 mV
Lead Channel Setting Pacing Amplitude: 2 V
Lead Channel Setting Pacing Amplitude: 2.5 V
Lead Channel Setting Sensing Sensitivity: 2 mV
MDC IDC MSMT BATTERY REMAINING LONGEVITY: 29 mo
MDC IDC MSMT LEADCHNL RA SENSING INTR AMPL: 1.4 mV
MDC IDC MSMT LEADCHNL RV PACING THRESHOLD AMPLITUDE: 0.5 V
MDC IDC MSMT LEADCHNL RV PACING THRESHOLD PULSEWIDTH: 0.4 ms
MDC IDC SET LEADCHNL RV PACING PULSEWIDTH: 0.4 ms
MDC IDC STAT BRADY AS VS PERCENT: 82 %

## 2014-03-08 ENCOUNTER — Ambulatory Visit (INDEPENDENT_AMBULATORY_CARE_PROVIDER_SITE_OTHER): Payer: BC Managed Care – PPO | Admitting: Internal Medicine

## 2014-03-08 ENCOUNTER — Other Ambulatory Visit (INDEPENDENT_AMBULATORY_CARE_PROVIDER_SITE_OTHER): Payer: BC Managed Care – PPO

## 2014-03-08 ENCOUNTER — Telehealth: Payer: Self-pay | Admitting: Internal Medicine

## 2014-03-08 ENCOUNTER — Encounter: Payer: Self-pay | Admitting: Internal Medicine

## 2014-03-08 VITALS — BP 110/82 | HR 79 | Temp 98.1°F | Resp 16 | Ht 72.0 in | Wt 176.0 lb

## 2014-03-08 DIAGNOSIS — E119 Type 2 diabetes mellitus without complications: Secondary | ICD-10-CM

## 2014-03-08 DIAGNOSIS — I10 Essential (primary) hypertension: Secondary | ICD-10-CM | POA: Diagnosis not present

## 2014-03-08 DIAGNOSIS — I4891 Unspecified atrial fibrillation: Secondary | ICD-10-CM | POA: Diagnosis not present

## 2014-03-08 DIAGNOSIS — I480 Paroxysmal atrial fibrillation: Secondary | ICD-10-CM

## 2014-03-08 DIAGNOSIS — Z23 Encounter for immunization: Secondary | ICD-10-CM | POA: Diagnosis not present

## 2014-03-08 DIAGNOSIS — Z0000 Encounter for general adult medical examination without abnormal findings: Secondary | ICD-10-CM

## 2014-03-08 LAB — BASIC METABOLIC PANEL
BUN: 18 mg/dL (ref 6–23)
CALCIUM: 9.3 mg/dL (ref 8.4–10.5)
CO2: 27 meq/L (ref 19–32)
Chloride: 105 mEq/L (ref 96–112)
Creatinine, Ser: 1.4 mg/dL (ref 0.4–1.5)
GFR: 63.42 mL/min (ref >60.00)
Glucose, Bld: 109 mg/dL — ABNORMAL HIGH (ref 70–99)
Potassium: 3 mEq/L — ABNORMAL LOW (ref 3.5–5.1)
SODIUM: 137 meq/L (ref 135–145)

## 2014-03-08 LAB — HEMOGLOBIN A1C: HEMOGLOBIN A1C: 7.7 % — AB (ref 4.6–6.5)

## 2014-03-08 NOTE — Progress Notes (Signed)
Pre visit review using our clinic review tool, if applicable. No additional management support is needed unless otherwise documented below in the visit note. 

## 2014-03-08 NOTE — Progress Notes (Signed)
   Subjective:    Patient ID: Michael Snyder, male    DOB: 02/24/1951, 63 y.o.   MRN: [id number]  HPI The patient is a 63 YO man who is coming in today to follow up on his diabetes. He also has PMH of paroxsymal A fib (with permanent pacemaker on xarelto), hx of substance abuse (currently clean and sober), diabetes type 2 (fair control). He is a non-smoker and still works. He is doing well and is not having any chest pains, SOB, abdominal pain, nausea, diarrhea, constipation. He has remote history of seizures associated with his drug and alcohol days and has not been on medicine without seizure for years.    Review of Systems  Constitutional: Negative for activity change, appetite change, fatigue and unexpected weight change.  Respiratory: Negative for cough, chest tightness, shortness of breath and wheezing.   Cardiovascular: Negative for chest pain, palpitations and leg swelling.  Gastrointestinal: Negative for abdominal pain, diarrhea, constipation and abdominal distention.  Musculoskeletal: Positive for arthralgias.       Minimal right shoulder pain  Neurological: Negative for dizziness, tremors, seizures, facial asymmetry, weakness, light-headedness, numbness and headaches.       Objective:   Physical Exam  Constitutional: He is oriented to person, place, and time. He appears well-developed and well-nourished.  HENT:  Head: Normocephalic and atraumatic.  Eyes: EOM are normal.  Neck: No JVD present.  Cardiovascular: Normal rate and regular rhythm.   Pulmonary/Chest: Effort normal and breath sounds normal. No respiratory distress. He has no wheezes. He has no rales.  Abdominal: Soft. Bowel sounds are normal. He exhibits no distension. There is no tenderness. There is no rebound and no guarding.  Musculoskeletal: Normal range of motion.  Neurological: He is alert and oriented to person, place, and time. No cranial nerve deficit.  Skin: Skin is warm and dry.  See foot exam           Assessment & Plan:

## 2014-03-08 NOTE — Assessment & Plan Note (Signed)
Will check BMP and HgA1c and adjust regimen as needed. Foot exam done at today's visit and the patient states he follows regularly with his eye doctor regarding his eye screening. He is on an ACE-I. No neuropathy or other complications at this time.

## 2014-03-08 NOTE — Patient Instructions (Signed)
We are going to check some labs today for your sugars and have given you the flu shot. We have sent a message to Dr. Ladona Ridgel about your heart procedure.   Be sure to call your insurance company about the shingles shot and see whether they want you to get it at the office or whether we should give you the prescription to take to a pharmacy.   Diabetes and Exercise Exercising regularly is important. It is not just about losing weight. It has many health benefits, such as:  Improving your overall fitness, flexibility, and endurance.  Increasing your bone density.  Helping with weight control.  Decreasing your body fat.  Increasing your muscle strength.  Reducing stress and tension.  Improving your overall health. People with diabetes who exercise gain additional benefits because exercise:  Reduces appetite.  Improves the body's use of blood sugar (glucose).  Helps lower or control blood glucose.  Decreases blood pressure.  Helps control blood lipids (such as cholesterol and triglycerides).  Improves the body's use of the hormone insulin by:  Increasing the body's insulin sensitivity.  Reducing the body's insulin needs.  Decreases the risk for heart disease because exercising:  Lowers cholesterol and triglycerides levels.  Increases the levels of good cholesterol (such as high-density lipoproteins [HDL]) in the body.  Lowers blood glucose levels. YOUR ACTIVITY PLAN  Choose an activity that you enjoy and set realistic goals. Your health care provider or diabetes educator can help you make an activity plan that works for you. Exercise regularly as directed by your health care provider. This includes:  Performing resistance training twice a week such as push-ups, sit-ups, lifting weights, or using resistance bands.  Performing 150 minutes of cardio exercises each week such as walking, running, or playing sports.  Staying active and spending no more than 90 minutes at one  time being inactive. Even short bursts of exercise are good for you. Three 10-minute sessions spread throughout the day are just as beneficial as a single 30-minute session. Some exercise ideas include:  Taking the dog for a walk.  Taking the stairs instead of the elevator.  Dancing to your favorite song.  Doing an exercise video.  Doing your favorite exercise with a friend. RECOMMENDATIONS FOR EXERCISING WITH TYPE 1 OR TYPE 2 DIABETES   Check your blood glucose before exercising. If blood glucose levels are greater than 240 mg/dL, check for urine ketones. Do not exercise if ketones are present.  Avoid injecting insulin into areas of the body that are going to be exercised. For example, avoid injecting insulin into:  The arms when playing tennis.  The legs when jogging.  Keep a record of:  Food intake before and after you exercise.  Expected peak times of insulin action.  Blood glucose levels before and after you exercise.  The type and amount of exercise you have done.  Review your records with your health care provider. Your health care provider will help you to develop guidelines for adjusting food intake and insulin amounts before and after exercising.  If you take insulin or oral hypoglycemic agents, watch for signs and symptoms of hypoglycemia. They include:  Dizziness.  Shaking.  Sweating.  Chills.  Confusion.  Drink plenty of water while you exercise to prevent dehydration or heat stroke. Body water is lost during exercise and must be replaced.  Talk to your health care provider before starting an exercise program to make sure it is safe for you. Remember, almost any type of  activity is better than none. Document Released: 09/12/2003 Document Revised: 11/06/2013 Document Reviewed: 11/29/2012 [facility] Patient Information 2015 [place], [place]. This information is not intended to replace advice given to you by your health care provider. Make sure you discuss any  questions you have with your health care provider.

## 2014-03-08 NOTE — Assessment & Plan Note (Signed)
Due to sick sinus he has permanent pacemaker. He is going to need surgery regarding lead placement and is currently taking xarelto. Rate controlled. Sent message to Dr. Ladona Ridgel regarding his preference about dates as he was unable to get in touch with their office.

## 2014-03-08 NOTE — Assessment & Plan Note (Signed)
Well controlled at this time on lisinopril/HCTZ 10/12.5 mg. Will continue to monitor.

## 2014-03-08 NOTE — Telephone Encounter (Signed)
Follow up:    Pt called back with a date chosen  Friday 18th September To do the procured.    Please give pt a call back to conf.

## 2014-03-08 NOTE — Assessment & Plan Note (Signed)
Advised patient to talk to his insurance company regarding shingles shot.

## 2014-03-09 ENCOUNTER — Other Ambulatory Visit: Payer: Self-pay | Admitting: Geriatric Medicine

## 2014-03-09 DIAGNOSIS — E876 Hypokalemia: Secondary | ICD-10-CM

## 2014-03-09 MED ORDER — LISINOPRIL-HYDROCHLOROTHIAZIDE 10-12.5 MG PO TABS: ORAL_TABLET | ORAL | Status: DC

## 2014-03-09 MED ORDER — LISINOPRIL 10 MG PO TABS: 10.0000 mg | ORAL_TABLET | Freq: Every day | ORAL | Status: DC

## 2014-03-09 MED ORDER — POTASSIUM CHLORIDE CRYS ER 20 MEQ PO TBCR: 20.0000 meq | EXTENDED_RELEASE_TABLET | Freq: Every day | ORAL | Status: DC

## 2014-03-13 ENCOUNTER — Encounter: Payer: Self-pay | Admitting: Internal Medicine

## 2014-03-14 ENCOUNTER — Telehealth: Payer: Self-pay | Admitting: Internal Medicine

## 2014-03-14 NOTE — Telephone Encounter (Signed)
Let him know I would talk to Dr Ladona Ridgel tomorrow and let him know but to plan on at least a week.

## 2014-03-14 NOTE — Telephone Encounter (Signed)
New message      Pt is having a pacemaker procedure soon----how long will he need to be out of work?

## 2014-03-16 NOTE — Telephone Encounter (Signed)
Spoke with patient and he is aware of time to be at the hospital on Fri 9/19.  To check in at [facility] at 12  NPO after midnight and will get hs labs at the hospital and we do not type and cross at the office.  He will take his last dose of Xarelto on 9/15

## 2014-03-19 ENCOUNTER — Encounter (HOSPITAL_COMMUNITY): Payer: Self-pay | Admitting: Pharmacy Technician

## 2014-03-22 ENCOUNTER — Encounter (HOSPITAL_COMMUNITY): Payer: Self-pay | Admitting: *Deleted

## 2014-03-22 NOTE — Progress Notes (Signed)
Fasting CBG 80-130.

## 2014-03-23 ENCOUNTER — Ambulatory Visit (HOSPITAL_COMMUNITY): Payer: BC Managed Care – PPO

## 2014-03-23 ENCOUNTER — Ambulatory Visit (HOSPITAL_COMMUNITY): Admit: 2014-03-23 | Payer: BC Managed Care – PPO | Admitting: Internal Medicine

## 2014-03-23 ENCOUNTER — Encounter (HOSPITAL_COMMUNITY)
Admission: RE | Disposition: A | Discharge status: Home / Self Care | Payer: Self-pay | Source: Ambulatory Visit | Attending: Internal Medicine

## 2014-03-23 ENCOUNTER — Encounter (HOSPITAL_COMMUNITY): Payer: Self-pay

## 2014-03-23 ENCOUNTER — Encounter (HOSPITAL_COMMUNITY): Payer: Self-pay | Admitting: *Deleted

## 2014-03-23 ENCOUNTER — Encounter (HOSPITAL_COMMUNITY): Payer: BC Managed Care – PPO | Admitting: Anesthesiology

## 2014-03-23 ENCOUNTER — Observation Stay (HOSPITAL_COMMUNITY)
Admission: RE | Admit: 2014-03-23 | Discharge: 2014-03-24 | Disposition: A | Discharge status: Home / Self Care | Payer: BC Managed Care – PPO | Source: Ambulatory Visit | Attending: Internal Medicine | Admitting: Internal Medicine

## 2014-03-23 ENCOUNTER — Ambulatory Visit (HOSPITAL_COMMUNITY): Payer: BC Managed Care – PPO | Admitting: Anesthesiology

## 2014-03-23 DIAGNOSIS — E119 Type 2 diabetes mellitus without complications: Secondary | ICD-10-CM | POA: Diagnosis not present

## 2014-03-23 DIAGNOSIS — Z9189 Other specified personal risk factors, not elsewhere classified: Secondary | ICD-10-CM | POA: Diagnosis not present

## 2014-03-23 DIAGNOSIS — Z791 Long term (current) use of non-steroidal anti-inflammatories (NSAID): Secondary | ICD-10-CM | POA: Insufficient documentation

## 2014-03-23 DIAGNOSIS — Z7901 Long term (current) use of anticoagulants: Secondary | ICD-10-CM | POA: Diagnosis not present

## 2014-03-23 DIAGNOSIS — T82190A Other mechanical complication of cardiac electrode, initial encounter: Secondary | ICD-10-CM | POA: Diagnosis not present

## 2014-03-23 DIAGNOSIS — Z45018 Encounter for adjustment and management of other part of cardiac pacemaker: Secondary | ICD-10-CM | POA: Diagnosis not present

## 2014-03-23 DIAGNOSIS — Z79899 Other long term (current) drug therapy: Secondary | ICD-10-CM | POA: Insufficient documentation

## 2014-03-23 DIAGNOSIS — T82598A Other mechanical complication of other cardiac and vascular devices and implants, initial encounter: Secondary | ICD-10-CM | POA: Diagnosis present

## 2014-03-23 DIAGNOSIS — G40909 Epilepsy, unspecified, not intractable, without status epilepticus: Secondary | ICD-10-CM | POA: Diagnosis not present

## 2014-03-23 DIAGNOSIS — I4891 Unspecified atrial fibrillation: Secondary | ICD-10-CM | POA: Insufficient documentation

## 2014-03-23 DIAGNOSIS — I10 Essential (primary) hypertension: Secondary | ICD-10-CM | POA: Insufficient documentation

## 2014-03-23 DIAGNOSIS — I495 Sick sinus syndrome: Secondary | ICD-10-CM

## 2014-03-23 DIAGNOSIS — Y831 Surgical operation with implant of artificial internal device as the cause of abnormal reaction of the patient, or of later complication, without mention of misadventure at the time of the procedure: Secondary | ICD-10-CM | POA: Insufficient documentation

## 2014-03-23 HISTORY — DX: Unspecified convulsions: R56.9

## 2014-03-23 HISTORY — DX: Essential (primary) hypertension: I10

## 2014-03-23 HISTORY — PX: PACEMAKER LEAD REMOVAL: SHX5064

## 2014-03-23 HISTORY — DX: Unspecified osteoarthritis, unspecified site: M19.90

## 2014-03-23 HISTORY — DX: Unspecified injury of head, initial encounter: S09.90XA

## 2014-03-23 LAB — TYPE AND SCREEN
ABO/RH(D): B POS
Antibody Screen: NEGATIVE

## 2014-03-23 LAB — CBC
HEMATOCRIT: 37.6 % — AB (ref 39.0–52.0)
Hemoglobin: 12.9 g/dL — ABNORMAL LOW (ref 13.0–17.0)
MCH: 29.5 pg (ref 26.0–34.0)
MCHC: 34.3 g/dL (ref 30.0–36.0)
MCV: 86 fL (ref 78.0–100.0)
PLATELETS: 133 10*3/uL — AB (ref 150–400)
RBC: 4.37 MIL/uL (ref 4.22–5.81)
RDW: 13.7 % (ref 11.5–15.5)
WBC: 5.7 10*3/uL (ref 4.0–10.5)

## 2014-03-23 LAB — ABO/RH: ABO/RH(D): B POS

## 2014-03-23 LAB — GLUCOSE, CAPILLARY
GLUCOSE-CAPILLARY: 123 mg/dL — AB (ref 70–99)
Glucose-Capillary: 112 mg/dL — ABNORMAL HIGH (ref 70–99)
Glucose-Capillary: 182 mg/dL — ABNORMAL HIGH (ref 70–99)

## 2014-03-23 LAB — BASIC METABOLIC PANEL WITH GFR
Anion gap: 14 (ref 5–15)
BUN: 20 mg/dL (ref 6–23)
CO2: 21 meq/L (ref 19–32)
Calcium: 9 mg/dL (ref 8.4–10.5)
Chloride: 107 meq/L (ref 96–112)
Creatinine, Ser: 1.4 mg/dL — ABNORMAL HIGH (ref 0.50–1.35)
GFR calc Af Amer: 60 mL/min — ABNORMAL LOW (ref >90)
GFR calc non Af Amer: 52 mL/min — ABNORMAL LOW (ref >90)
Glucose, Bld: 138 mg/dL — ABNORMAL HIGH (ref 70–99)
Potassium: 2.9 meq/L — CL (ref 3.7–5.3)
Sodium: 142 meq/L (ref 137–147)

## 2014-03-23 SURGERY — REMOVAL, ELECTRODE LEAD, CARDIAC PACEMAKER, WITHOUT REPLACEMENT
Anesthesia: General | Site: Chest

## 2014-03-23 SURGERY — LEAD REVISION
Anesthesia: LOCAL

## 2014-03-23 MED ORDER — LISINOPRIL 10 MG PO TABS
10.0000 mg | ORAL_TABLET | Freq: Every day | ORAL | Status: DC
Start: 2014-03-24 — End: 2014-03-24
  Administered 2014-03-24: 10 mg via ORAL
  Filled 2014-03-23: qty 1

## 2014-03-23 MED ORDER — GLIPIZIDE 5 MG PO TABS
5.0000 mg | ORAL_TABLET | Freq: Two times a day (BID) | ORAL | Status: DC
  Administered 2014-03-24: 5 mg via ORAL
  Filled 2014-03-23 (×3): qty 1

## 2014-03-23 MED ORDER — RIVAROXABAN 20 MG PO TABS
20.0000 mg | ORAL_TABLET | Freq: Every day | ORAL | Status: DC
  Filled 2014-03-23: qty 1

## 2014-03-23 MED ORDER — POTASSIUM CHLORIDE 10 MEQ/100ML IV SOLN
10.0000 meq | INTRAVENOUS | Status: AC
  Administered 2014-03-23 (×3): 10 meq via INTRAVENOUS
  Filled 2014-03-23 (×6): qty 100

## 2014-03-23 MED ORDER — ONDANSETRON HCL 4 MG/2ML IJ SOLN
INTRAMUSCULAR | Status: AC
  Filled 2014-03-23: qty 4

## 2014-03-23 MED ORDER — MELOXICAM 15 MG PO TABS: 15.0000 mg | ORAL_TABLET | Freq: Every day | ORAL | Status: DC

## 2014-03-23 MED ORDER — ONDANSETRON HCL 4 MG/2ML IJ SOLN: 4.0000 mg | Freq: Four times a day (QID) | INTRAMUSCULAR | Status: DC | PRN

## 2014-03-23 MED ORDER — ACETAMINOPHEN 325 MG PO TABS: 325.0000 mg | ORAL_TABLET | ORAL | Status: DC | PRN

## 2014-03-23 MED ORDER — OXYCODONE-ACETAMINOPHEN 5-325 MG PO TABS
1.0000 | ORAL_TABLET | Freq: Four times a day (QID) | ORAL | Status: DC | PRN
  Administered 2014-03-23: 1 via ORAL
  Filled 2014-03-23: qty 1

## 2014-03-23 MED ORDER — LIDOCAINE HCL (PF) 1 % IJ SOLN
INTRAMUSCULAR | Status: AC
  Filled 2014-03-23: qty 30

## 2014-03-23 MED ORDER — MIDAZOLAM HCL 2 MG/2ML IJ SOLN
INTRAMUSCULAR | Status: AC
  Filled 2014-03-23: qty 2

## 2014-03-23 MED ORDER — GLYCOPYRROLATE 0.2 MG/ML IJ SOLN
INTRAMUSCULAR | Status: DC | PRN
  Administered 2014-03-23: .8 mg via INTRAVENOUS

## 2014-03-23 MED ORDER — FENTANYL CITRATE 0.05 MG/ML IJ SOLN
INTRAMUSCULAR | Status: AC
  Filled 2014-03-23: qty 5

## 2014-03-23 MED ORDER — LISINOPRIL-HYDROCHLOROTHIAZIDE 10-12.5 MG PO TABS: 1.0000 | ORAL_TABLET | Freq: Every day | ORAL | Status: DC

## 2014-03-23 MED ORDER — ADULT MULTIVITAMIN W/MINERALS CH
1.0000 | ORAL_TABLET | Freq: Every day | ORAL | Status: DC
  Administered 2014-03-24: 1 via ORAL
  Filled 2014-03-23: qty 1

## 2014-03-23 MED ORDER — POTASSIUM CHLORIDE 10 MEQ/100ML IV SOLN
10.0000 meq | INTRAVENOUS | Status: AC
  Administered 2014-03-23 (×3): 10 meq via INTRAVENOUS
  Filled 2014-03-23 (×9): qty 100

## 2014-03-23 MED ORDER — CHLORHEXIDINE GLUCONATE 4 % EX LIQD
60.0000 mL | Freq: Once | CUTANEOUS | Status: DC
  Filled 2014-03-23: qty 60

## 2014-03-23 MED ORDER — PROPOFOL 10 MG/ML IV BOLUS
INTRAVENOUS | Status: AC
  Filled 2014-03-23: qty 20

## 2014-03-23 MED ORDER — LACTATED RINGERS IV SOLN
INTRAVENOUS | Status: DC
  Administered 2014-03-23: 12:00:00 via INTRAVENOUS

## 2014-03-23 MED ORDER — ONDANSETRON HCL 4 MG/2ML IJ SOLN
INTRAMUSCULAR | Status: DC | PRN
  Administered 2014-03-23: 4 mg via INTRAVENOUS

## 2014-03-23 MED ORDER — PROPOFOL 10 MG/ML IV BOLUS
INTRAVENOUS | Status: DC | PRN
  Administered 2014-03-23: 120 mg via INTRAVENOUS

## 2014-03-23 MED ORDER — OXYCODONE HCL 5 MG PO TABS: 5.0000 mg | ORAL_TABLET | Freq: Once | ORAL | Status: DC | PRN

## 2014-03-23 MED ORDER — FENTANYL CITRATE 0.05 MG/ML IJ SOLN
INTRAMUSCULAR | Status: DC | PRN
  Administered 2014-03-23: 25 ug via INTRAVENOUS
  Administered 2014-03-23 (×2): 50 ug via INTRAVENOUS
  Administered 2014-03-23: 25 ug via INTRAVENOUS

## 2014-03-23 MED ORDER — FENTANYL CITRATE 0.05 MG/ML IJ SOLN: 25.0000 ug | INTRAMUSCULAR | Status: DC | PRN

## 2014-03-23 MED ORDER — DEXAMETHASONE SODIUM PHOSPHATE 4 MG/ML IJ SOLN
INTRAMUSCULAR | Status: DC | PRN
  Administered 2014-03-23: 4 mg via INTRAVENOUS

## 2014-03-23 MED ORDER — MIDAZOLAM HCL 2 MG/2ML IJ SOLN
INTRAMUSCULAR | Status: AC
  Administered 2014-03-23: 1 mg
  Filled 2014-03-23: qty 2

## 2014-03-23 MED ORDER — LIDOCAINE HCL (CARDIAC) 20 MG/ML IV SOLN
INTRAVENOUS | Status: DC | PRN
  Administered 2014-03-23: 60 mg via INTRAVENOUS

## 2014-03-23 MED ORDER — SODIUM CHLORIDE 0.9 % IR SOLN
Status: DC | PRN
  Administered 2014-03-23: 14:00:00

## 2014-03-23 MED ORDER — DEXAMETHASONE SODIUM PHOSPHATE 4 MG/ML IJ SOLN
INTRAMUSCULAR | Status: AC
  Filled 2014-03-23: qty 1

## 2014-03-23 MED ORDER — HYDROCHLOROTHIAZIDE 12.5 MG PO CAPS
12.5000 mg | ORAL_CAPSULE | Freq: Every day | ORAL | Status: DC
  Administered 2014-03-24: 12.5 mg via ORAL
  Filled 2014-03-23: qty 1

## 2014-03-23 MED ORDER — CEFAZOLIN SODIUM-DEXTROSE 2-3 GM-% IV SOLR
2.0000 g | Freq: Four times a day (QID) | INTRAVENOUS | Status: AC
  Administered 2014-03-23 – 2014-03-24 (×3): 2 g via INTRAVENOUS
  Filled 2014-03-23 (×3): qty 50

## 2014-03-23 MED ORDER — FENTANYL CITRATE 0.05 MG/ML IJ SOLN
INTRAMUSCULAR | Status: AC
  Administered 2014-03-23: 50 ug
  Filled 2014-03-23: qty 2

## 2014-03-23 MED ORDER — FENTANYL CITRATE 0.05 MG/ML IJ SOLN
INTRAMUSCULAR | Status: AC
  Administered 2014-03-23: 25 ug
  Filled 2014-03-23: qty 2

## 2014-03-23 MED ORDER — PHENYLEPHRINE HCL 10 MG/ML IJ SOLN
10.0000 mg | INTRAVENOUS | Status: DC | PRN
  Administered 2014-03-23: 20 ug/min via INTRAVENOUS

## 2014-03-23 MED ORDER — 0.9 % SODIUM CHLORIDE (POUR BTL) OPTIME
TOPICAL | Status: DC | PRN
  Administered 2014-03-23: 1000 mL

## 2014-03-23 MED ORDER — SODIUM CHLORIDE 0.9 % IR SOLN
80.0000 mg | Status: DC
  Filled 2014-03-23: qty 2

## 2014-03-23 MED ORDER — NEOSTIGMINE METHYLSULFATE 10 MG/10ML IV SOLN
INTRAVENOUS | Status: DC | PRN
  Administered 2014-03-23: 5 mg via INTRAVENOUS

## 2014-03-23 MED ORDER — CEFAZOLIN SODIUM-DEXTROSE 2-3 GM-% IV SOLR
2.0000 g | INTRAVENOUS | Status: DC
  Filled 2014-03-23: qty 50

## 2014-03-23 MED ORDER — POTASSIUM CHLORIDE CRYS ER 20 MEQ PO TBCR
20.0000 meq | EXTENDED_RELEASE_TABLET | Freq: Every day | ORAL | Status: DC
  Administered 2014-03-24: 20 meq via ORAL
  Filled 2014-03-23 (×2): qty 1

## 2014-03-23 MED ORDER — MIDAZOLAM HCL 5 MG/5ML IJ SOLN
INTRAMUSCULAR | Status: DC | PRN
  Administered 2014-03-23 (×2): 1 mg via INTRAVENOUS

## 2014-03-23 MED ORDER — LIDOCAINE HCL (PF) 1 % IJ SOLN
INTRAMUSCULAR | Status: DC | PRN
  Administered 2014-03-23: 60 mL

## 2014-03-23 MED ORDER — GABAPENTIN 300 MG PO CAPS: 300.0000 mg | ORAL_CAPSULE | Freq: Four times a day (QID) | ORAL | Status: DC | PRN

## 2014-03-23 MED ORDER — OXYCODONE HCL 5 MG/5ML PO SOLN: 5.0000 mg | Freq: Once | ORAL | Status: DC | PRN

## 2014-03-23 MED ORDER — CEFAZOLIN SODIUM-DEXTROSE 2-3 GM-% IV SOLR
INTRAVENOUS | Status: AC
  Administered 2014-03-23: 2 g via INTRAVENOUS
  Filled 2014-03-23: qty 50

## 2014-03-23 MED ORDER — ROCURONIUM BROMIDE 100 MG/10ML IV SOLN
INTRAVENOUS | Status: DC | PRN
  Administered 2014-03-23: 50 mg via INTRAVENOUS

## 2014-03-23 MED ORDER — LACTATED RINGERS IV SOLN
INTRAVENOUS | Status: DC | PRN
  Administered 2014-03-23: 14:00:00 via INTRAVENOUS

## 2014-03-23 SURGICAL SUPPLY — 41 items
BAG BANDED W/RUBBER/TAPE 36X54 (MISCELLANEOUS) ×2 IMPLANT
BLADE 10 SAFETY STRL DISP (BLADE) ×2 IMPLANT
BLADE STERNUM SYSTEM 6 (BLADE) ×2 IMPLANT
BNDG COHESIVE 4X5 WHT NS (GAUZE/BANDAGES/DRESSINGS) IMPLANT
CANISTER SUCTION 2500CC (MISCELLANEOUS) ×2 IMPLANT
COVER DOME SNAP 22 D (MISCELLANEOUS) ×2 IMPLANT
COVER TABLE BACK 60X90 (DRAPES) ×2 IMPLANT
DEVICE LCKNG LEAD CARDIAC (CATHETERS) ×1 IMPLANT
DEVICE TORQUE H2O (MISCELLANEOUS) ×2 IMPLANT
DRAPE CARDIOVASCULAR INCISE (DRAPES) ×1
DRAPE SRG 135X102X78XABS (DRAPES) ×1 IMPLANT
DRSG OPSITE 6X11 MED (GAUZE/BANDAGES/DRESSINGS) ×2 IMPLANT
ELECT REM PT RETURN 9FT ADLT (ELECTROSURGICAL) ×4
ELECTRODE REM PT RTRN 9FT ADLT (ELECTROSURGICAL) ×2 IMPLANT
GAUZE SPONGE 4X4 12PLY STRL (GAUZE/BANDAGES/DRESSINGS) IMPLANT
GAUZE SPONGE 4X4 16PLY XRAY LF (GAUZE/BANDAGES/DRESSINGS) IMPLANT
GLOVE BIOGEL PI IND STRL 7.5 (GLOVE) ×1 IMPLANT
GLOVE BIOGEL PI INDICATOR 7.5 (GLOVE) ×1
GLOVE ECLIPSE 8.0 STRL XLNG CF (GLOVE) ×2 IMPLANT
GOWN STRL REUS W/ TWL LRG LVL3 (GOWN DISPOSABLE) IMPLANT
GOWN STRL REUS W/ TWL XL LVL3 (GOWN DISPOSABLE) ×1 IMPLANT
GOWN STRL REUS W/TWL LRG LVL3 (GOWN DISPOSABLE)
GOWN STRL REUS W/TWL XL LVL3 (GOWN DISPOSABLE) ×1
GUIDEWIRE ANGLED .035X150CM (WIRE) ×2 IMPLANT
KIT ROOM TURNOVER OR (KITS) ×2 IMPLANT
LEAD CAPSURE NOVUS 5076-58CM (Lead) ×2 IMPLANT
MEDTRONIC ×4 IMPLANT
PACEMAKER ADAPTA DR ADDRL1 (Pacemaker) ×1 IMPLANT
PAD ARMBOARD 7.5X6 YLW CONV (MISCELLANEOUS) ×4 IMPLANT
PAD ELECT DEFIB RADIOL ZOLL (MISCELLANEOUS) ×2 IMPLANT
PPM ADAPTA DR ADDRL1 (Pacemaker) ×2 IMPLANT
REMOVAL LLD CARDIAC LEAD EZ (CATHETERS) ×2
SHEATH COOK PEEL AWAY SET 7F (SHEATH) ×2 IMPLANT
SPONGE GAUZE 4X4 12PLY STER LF (GAUZE/BANDAGES/DRESSINGS) ×2 IMPLANT
SUT PROLENE 2 0 SH DA (SUTURE) IMPLANT
TAPE CLOTH SURG 4X10 WHT LF (GAUZE/BANDAGES/DRESSINGS) ×2 IMPLANT
TOWEL OR 17X24 6PK STRL BLUE (TOWEL DISPOSABLE) ×4 IMPLANT
TOWEL OR 17X26 10 PK STRL BLUE (TOWEL DISPOSABLE) ×4 IMPLANT
TRAY FOLEY IC TEMP SENS 14FR (CATHETERS) ×2 IMPLANT
TUBE CONNECTING 12X1/4 (SUCTIONS) IMPLANT
YANKAUER SUCT BULB TIP NO VENT (SUCTIONS) IMPLANT

## 2014-03-23 NOTE — Progress Notes (Signed)
Pt is ready to transport to 2 west. Radiologist requesting repeat CXR to verify correct placement.

## 2014-03-23 NOTE — Anesthesia Postprocedure Evaluation (Signed)
Anesthesia Post Note  Patient: Michael Snyder  Procedure(s) Performed: Procedure(s) (LRB): PACEMAKER LEAD REMOVAL (N/A)  Anesthesia type: general  Patient location: PACU  Post pain: Pain level controlled  Post assessment: Patient's Cardiovascular Status Stable  Last Vitals:  Filed Vitals:   03/23/14 1845  BP: 130/91  Pulse: 60  Temp:   Resp: 13    Post vital signs: Reviewed and stable  Level of consciousness: sedated  Complications: No apparent anesthesia complications

## 2014-03-23 NOTE — Transfer of Care (Signed)
Immediate Anesthesia Transfer of Care Note  Patient: Michael Snyder  Procedure(s) Performed: Procedure(s): PACEMAKER LEAD REMOVAL (N/A)  Patient Location: PACU  Anesthesia Type:General  Level of Consciousness: awake, alert , oriented and patient cooperative  Airway & Oxygen Therapy: Patient Spontanous Breathing and Patient connected to nasal cannula oxygen  Post-op Assessment: Report given to PACU RN, Post -op Vital signs reviewed and stable and Patient moving all extremities  Post vital signs: Reviewed and stable  Complications: No apparent anesthesia complications

## 2014-03-23 NOTE — Interval H&P Note (Signed)
History and Physical Interval Note:  03/23/2014 1:05 PM  Michael Snyder  has presented today for surgery, with the diagnosis of ventricular lead dysfunction  The various methods of treatment have been discussed with the patient and family. After consideration of risks, benefits and other options for treatment, the patient has consented to  Procedure(s): PACEMAKER LEAD REMOVAL (N/A) and insertion of a new pacing lead and new PM generator as a surgical intervention .  The patient's history has been reviewed, patient examined, no change in status, stable for surgery.  I have reviewed the patient's chart and labs.  Questions were answered to the patient's satisfaction.     Leonia Reeves.D.

## 2014-03-23 NOTE — Progress Notes (Signed)
Orthopedic Tech Progress Note Patient Details:  Michael Snyder 09/22/1950 [id number]  Ortho Devices Type of Ortho Device: Arm sling Ortho Device/Splint Location: lue Ortho Device/Splint Interventions: Application   Michael Snyder 03/23/2014, 8:40 PM

## 2014-03-23 NOTE — H&P (View-Only) (Signed)
HPI Michael Snyder is referred today by Dr. Graciela Husbands for evaluation of a prior ventricular lead dislocation. He is a very pleasant 63 year old man with a history of symptomatic sinus node dysfunction, status post permanent pacemaker insertion. The patient has been otherwise healthy. He does take systemic anticoagulation for paroxysmal atrial fibrillation. He had increasing palpitations, and PVCs, and a chest x-ray demonstrated that his ventricular lead appears to have dislodged and then reimplanted near the base of the right ventricle near the tricuspid valve annulus. The patient's lead does pace appropriately. Review of his chest x-ray demonstrates a very large loop is present around the region of the right atrium/tricuspid valve. He denies fever or chills. He denies any history of trauma. He has not had syncope. No Known Allergies   Current Outpatient Prescriptions  Medication Sig Dispense Refill  . ALPHAGAN P 0.1 % SOLN Place 2 drops into the left eye daily.      . Calcium Carbonate-Vitamin D (CALTRATE 600+D) 600-400 MG-UNIT per tablet Take 1 tablet by mouth 2 (two) times daily.  180 tablet  3  . gabapentin (NEURONTIN) 300 MG capsule Take 300 mg by mouth every 6 (six) hours as needed (pain).       [name] glipiZIDE (GLUCOTROL) 5 MG tablet Take 1 tablet (5 mg total) by mouth 2 (two) times daily before a meal.  180 tablet  3  . lisinopril-hydrochlorothiazide (PRINZIDE,ZESTORETIC) 10-12.5 MG per tablet Take 1 tablet by mouth  every day  90 tablet  0  . meloxicam (MOBIC) 15 MG tablet Take 1 tablet (15 mg total) by mouth daily.  90 tablet  3  . MULTIPLE VITAMIN PO Take 1 capsule by mouth daily.       . Omega-3 Fatty Acids (FISH OIL) 300 MG CAPS Take 1 capsule (300 mg total) by mouth daily.  90 capsule  3  . rivaroxaban (XARELTO) 20 MG TABS tablet TAKE ONE TABLET BY MOUTH EVERY DAY  90 tablet  3   No current facility-administered medications for this visit.     Past Medical History  Diagnosis Date    . Alcohol abuse   . 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 Diabetes mellitus, type 2   . Seizure disorder   . 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 History of alcohol abuse     ROS:   All systems reviewed and negative except as noted in the HPI.   Past Surgical History  Procedure Laterality Date  . Laceration repair      scalp  . Ptdvp      7/08 medtronic DDD     Family History  Problem Relation Age of Onset  . Hypertension Mother   . COPD Mother   . Hypertension Brother   . Hypertension Brother      History   Social History  . Marital Status: Married    Spouse Name: N/A    Number of Children: N/A  . Years of Education: N/A   Occupational History  . Not on file.   Social History Main Topics  . Smoking status: Never Smoker   . Smokeless tobacco: Never Used  . Alcohol Use: No  . Drug Use: No  . Sexual Activity: Yes    Partners: Female   Other Topics Concern  . Not on file   Social History Narrative   12th grade education. H/O substance abuse- now abstemious and works with other in  recovery..Attends [employer] on a regular basis and serves as a sponsor. Married 18 years-divorced. Married 5 years 2nd marriage.3 daughters previous marriage, 9 grandchildren, 3 great-grands. Work- Arts administrator              BP 161/09  Pulse 73  Ht 6' (1.829 m)  Wt 175 lb 3.2 oz (79.47 kg)  BMI 23.76 kg/m2  Physical Exam:  Well appearing 63 year old man, NAD HEENT: Unremarkable Neck:  No JVD, no thyromegally Back:  No CVA tenderness Lungs:  Clear with no wheezes, rales, or rhonchi. HEART:  Regular rate rhythm, no murmurs, no rubs, no clicks Abd:  soft, positive bowel sounds, no organomegally, no rebound, no guarding Ext:  2 plus pulses, no edema, no cyanosis, no clubbing Skin:  No rashes no nodules Neuro:  CN II through XII intact, motor grossly intact  Chest x-ray - reviewed  DEVICE  Normal device function.  See PaceArt for details.    Assess/Plan:

## 2014-03-23 NOTE — Anesthesia Preprocedure Evaluation (Signed)
Anesthesia Evaluation  Patient identified by MRN, date of birth, ID band Patient awake    Reviewed: Allergy & Precautions, H&P , NPO status , Patient's Chart, lab work & pertinent test results  Airway Mallampati: II  Neck ROM: full    Dental   Pulmonary          Cardiovascular hypertension, + pacemaker     Neuro/Psych Seizures -,     GI/Hepatic (+)     substance abuse  alcohol use and IV drug use,   Endo/Other  diabetes, Type 2  Renal/GU      Musculoskeletal  (+) Arthritis -,   Abdominal   Peds  Hematology   Anesthesia Other Findings   Reproductive/Obstetrics                           Anesthesia Physical Anesthesia Plan  ASA: III  Anesthesia Plan: General   Post-op Pain Management:    Induction: Intravenous  Airway Management Planned: Oral ETT  Additional Equipment: Arterial line and CVP  Intra-op Plan:   Post-operative Plan: Extubation in OR  Informed Consent: I have reviewed the patients History and Physical, chart, labs and discussed the procedure including the risks, benefits and alternatives for the proposed anesthesia with the patient or authorized representative who has indicated his/her understanding and acceptance.     Plan Discussed with: CRNA, Anesthesiologist and Surgeon  Anesthesia Plan Comments:         Anesthesia Quick Evaluation

## 2014-03-23 NOTE — Progress Notes (Signed)
[employer] called, spoke with [facility] with info including surgery time given. This nurse told that she would have rep call back. Leta Jungling with [employer] called back and reported to this nurse that they have pt on their schedule and will be here.

## 2014-03-23 NOTE — Progress Notes (Signed)
Repeat CXR WNL

## 2014-03-23 NOTE — Progress Notes (Signed)
I tried to find patient's daughter , Carol Loftin to give her his upper plate.  I asked the volunteers if they would be on the look out for her.  I left denture cup with volunteers, Marvia Pickles  I did inform pt of my actions.  DA

## 2014-03-23 NOTE — CV Procedure (Signed)
Electrophysiology procedure note  Procedure: Extraction of a previously implanted right ventricular pacing lead, removal of a previously implanted dual-chamber pacemaker, status post insertion of a new dual-chamber pacemaker with insertion of a new right ventricular pacing lead in a patient with a history of sinus node dysfunction, symptomatic bradycardia, who would developed dislodgment of his right ventricular pacing lead  Preoperative diagnosis: Previous right ventricular pacing lead dislodgment in a patient with symptomatic sinus node dysfunction who underwent prior permanent pacemaker insertion years ago and was found to have dislodgment of his right ventricular pacing lead.  Postoperative diagnosis: Same as preoperative diagnoses.  Description of the procedure: After informed consent was obtained, the patient was taken to the operating room in a fasting state. Invasive hemodynamic monitoring was provided with an art line in the contralateral upper extremity. After the usual preparation and draping, the anesthesia service was utilized to provide general anesthesia. 30 cc of lidocaine was infiltrated into the left infraclavicular region. A 5 cm incision was carried out over this region, and electrocautery was utilized to dissect down to the fascial plane. The left subclavian vein was punctured, and a Glidewire was advanced into the central circulation. At this point attention was then turned to the right ventricular pacing lead. A stylette was inserted into the lead. Electrocautery was utilized to free the patient's fibrous adhesions. Electrocautery was utilized to assure hemostasis. Initial attempts to retract the helix of the right ventricular pacing lead were only partially successful. We could not get the stylette into the tip of the lead. At this point a Spectranetics locking stylette was inserted into the right ventricular pacing lead. The proximal portion lead was secured with silk suture. The  lead was fixed with a locking stylette. 11 French mini Spectranetics sheath was inserted over the pacing lead and into the subclavian vein. The sheath had difficulty breaking through the fibrous adhesions. An 40 French Cook RL mechanical extraction sheath was then inserted into the subclavian vein, over the pacing lead. Traction, countertraction, pressure, and counterpressure was then employed along with the [facility] RL mechanical extraction sheath to remove the lead in total. There were no hemodynamic sequelae. After hemostasis was assured, attention was turned to placement of the new ventricular pacing lead.  Medtronic model 5076, 58 cm active-fixation pacing lead, serial [id number] was inserted under fluoroscopic guidance into the right ventricle. The lead was fixed. R waves measured 8 mV. The patient threshold was a voltage 0.5 ms. The pacing impedance was 900 ohms and there was a large injury current with active-fixation of the lead. The lead was secured to the fascia with silk suture. Antibiotic irrigation was utilized to irrigate the pocket. The old atrial lead was evaluated, and found to be working satisfactorily. The new Medtronic Adapta DDD PM serial [id number] was connected to the old RA lead and the new RV lead and placed back in the subcutaneous pocket. The pocket was irrigated with antibiotic irrigation, and the incision closed with 2 layers of Vicryl suture. Benzoin and Steri-Strips of pain on the skin, a pressure dressing applied, and the patient returned to his room in satisfactory condition.  Complications: No immediate procedure complications  Conclusion: Successful extraction of a previous implanted RV lead which had dislodged, and insertion of a new right ventricular pacing lead, removal of the old dual-chamber pacemaker which was approaching elective replacement, and insertion of a new dual-chamber pacemaker.  Lewayne Bunting, M.D.

## 2014-03-24 ENCOUNTER — Observation Stay (HOSPITAL_COMMUNITY): Payer: BC Managed Care – PPO

## 2014-03-24 DIAGNOSIS — I4891 Unspecified atrial fibrillation: Secondary | ICD-10-CM | POA: Diagnosis not present

## 2014-03-24 DIAGNOSIS — I495 Sick sinus syndrome: Secondary | ICD-10-CM

## 2014-03-24 DIAGNOSIS — T82190A Other mechanical complication of cardiac electrode, initial encounter: Secondary | ICD-10-CM | POA: Diagnosis not present

## 2014-03-24 DIAGNOSIS — Z45018 Encounter for adjustment and management of other part of cardiac pacemaker: Secondary | ICD-10-CM | POA: Diagnosis not present

## 2014-03-24 LAB — BASIC METABOLIC PANEL
Anion gap: 14 (ref 5–15)
BUN: 20 mg/dL (ref 6–23)
CO2: 20 mEq/L (ref 19–32)
Calcium: 8.8 mg/dL (ref 8.4–10.5)
Chloride: 106 mEq/L (ref 96–112)
Creatinine, Ser: 1.23 mg/dL (ref 0.50–1.35)
GFR calc Af Amer: 70 mL/min — ABNORMAL LOW (ref >90)
GFR calc non Af Amer: 60 mL/min — ABNORMAL LOW (ref >90)
GLUCOSE: 162 mg/dL — AB (ref 70–99)
POTASSIUM: 3.7 meq/L (ref 3.7–5.3)
Sodium: 140 mEq/L (ref 137–147)

## 2014-03-24 LAB — GLUCOSE, CAPILLARY: Glucose-Capillary: 159 mg/dL — ABNORMAL HIGH (ref 70–99)

## 2014-03-24 MED ORDER — POTASSIUM CHLORIDE CRYS ER 20 MEQ PO TBCR: 20.0000 meq | EXTENDED_RELEASE_TABLET | Freq: Two times a day (BID) | ORAL | Status: DC

## 2014-03-24 MED ORDER — SODIUM CHLORIDE 0.9 % IJ SOLN
10.0000 mL | INTRAMUSCULAR | Status: DC | PRN
Start: 2014-03-24 — End: 2014-03-24
  Administered 2014-03-24: 10 mL

## 2014-03-24 NOTE — Discharge Instructions (Signed)
**  PLEASE REMEMBER TO BRING ALL OF YOUR MEDICATIONS TO EACH OF YOUR FOLLOW-UP OFFICE VISITS.     Supplemental Discharge Instructions for  Pacemaker/Defibrillator Patients  Activity No heavy lifting or vigorous activity with your left/right arm for 6 to 8 weeks.  Do not raise your left/right arm above your head for one week.  Gradually raise your affected arm as drawn below.           __        9/23                  /         9/24              /            9/25            /            9/26           NO DRIVING for     ; you may begin driving on  9/2   .  WOUND CARE   Keep the wound area clean and dry.  Do not get this area wet for one week. No showers for one week; you may shower on     .   The tape/steri-strips on your wound will fall off; do not pull them off.  No bandage is needed on the site.  DO  NOT apply any creams, oils, or ointments to the wound area.   If you notice any drainage or discharge from the wound, any swelling or bruising at the site, or you develop a fever > 101? F after you are discharged home, call the office at once.  Special Instructions   You are still able to use cellular telephones; use the ear opposite the side where you have your pacemaker/defibrillator.  Avoid carrying your cellular phone near your device.   When traveling through airports, show security personnel your identification card to avoid being screened in the metal detectors.  Ask the security personnel to use the hand wand.   Avoid arc welding equipment, MRI testing (magnetic resonance imaging), TENS units (transcutaneous nerve stimulators).  Call the office for questions about other devices.   Avoid electrical appliances that are in poor condition or are not properly grounded.   Microwave ovens are safe to be near or to operate.

## 2014-03-24 NOTE — Progress Notes (Signed)
DC IV and tele per MD orders and protocol; DC R IJ per orders and protocol; pt on bedrest for 30 minutes; DC instructions reviewed and signed with pt; no further questions from pt.  Hermina Barters, RN

## 2014-03-24 NOTE — Discharge Summary (Signed)
ELECTROPHYSIOLOGY PROCEDURE DISCHARGE SUMMARY    Patient ID: Michael Snyder,  MRN: [id number], DOB/AGE: 1950/11/05 63 y.o.  Admit date: 03/23/2014 Discharge date: 03/24/2014  Primary Care Physician: Judie Bonus, MD Electrophysiologist: Graciela Husbands  Primary Discharge Diagnosis:  1.  Ventricular lead dislodgement status post extraction of previously implanted RV lead, implantation of new RV lead and new pulse generator this admission  Secondary Discharge Diagnosis:  1.  Sinus node dysfunction 2.  Atrial fibrillation 3.  Diabetes 4.  Prior polysubstance abuse 5.  Prior alcohol abuse 6.  Seizure disorder  No Known Allergies   Procedures This Admission:  1.  Right ventricular lead extraction followed by implantation of new right ventricular lead and new pacemaker pulse generator on 03-23-14 by Dr Ladona Ridgel.  The patient received a new 5076 right ventricular lead with MDT ADDRL1 pacemaker.  The previously implanted RA lead was used.  There were no early apparent complications.  2.  CXR on 03-24-14 demonstrated no PTX  Brief HPI: Michael Snyder is a 63 y.o. male with a past medical history of symptomatic sinus node dysfunction status post pacemaker implantation in 2008.  His RV lead has been found to have dislodged and reconnected with a large portion of the lead wrapped around and against the RV free wall causing ventricular ectopy.  Due to a large amount of lead slack around the RA/tricuspid valve, lead revision was recommended.  Risks, benefits, and alternatives were reviewed with the patient who wished to proceed.  Hospital Course:  The patient was admitted and underwent RV lead extraction with subsequent RV lead implantation and pacemaker generator change with details as outlined above.  He was monitored on telemetry overnight which demonstrated sinus rhythm with occasional atrial pacing.  His left chest was without hematoma or ecchymosis.  Device interrogation was normal (see paper  chart).  He was evaluated by Dr Ladona Ridgel and considered stable for discharge to home  He was hypokalemic on admission and this was repleted.  Repeat K day of discharge was not currently available.  He will be discharged home on K supplementation with repeat BMET at wound check.   Discharge Vitals: Blood pressure 131/81, pulse 64, temperature 97.8 F (36.6 C), temperature source Oral, resp. rate 16, weight 188 lb 12.8 oz (85.639 kg), SpO2 100.00%.   Labs:   Lab Results  Component Value Date   WBC 5.7 03/23/2014   HGB 12.9* 03/23/2014   HCT 37.6* 03/23/2014   MCV 86.0 03/23/2014   PLT 133* 03/23/2014     Recent Labs Lab 03/23/14 1115  NA 142  K 2.9*  CL 107  CO2 21  BUN 20  CREATININE 1.40*  CALCIUM 9.0  GLUCOSE 138*    Discharge Medications:    Medication List    ASK your doctor about these medications       ALPHAGAN P 0.1 % Soln  Generic drug:  brimonidine  Place 2 drops into the left eye daily.     Calcium Carbonate-Vitamin D 600-400 MG-UNIT per tablet  Commonly known as:  CALTRATE 600+D  Take 1 tablet by mouth 2 (two) times daily.     Fish Oil 300 MG Caps  Take 1 capsule (300 mg total) by mouth daily.     gabapentin 300 MG capsule  Commonly known as:  NEURONTIN  Take 300 mg by mouth every 6 (six) hours as needed (pain).     glipiZIDE 5 MG tablet  Commonly known as:  GLUCOTROL  Take 1 tablet (  5 mg total) by mouth 2 (two) times daily before a meal.     lisinopril-hydrochlorothiazide 10-12.5 MG per tablet  Commonly known as:  PRINZIDE,ZESTORETIC  Take 1 tablet by mouth daily.     meloxicam 15 MG tablet  Commonly known as:  MOBIC  Take 1 tablet (15 mg total) by mouth daily.     multivitamin with minerals Tabs tablet  Take 1 tablet by mouth daily.     potassium chloride SA 20 MEQ tablet  Commonly known as:  K-DUR,KLOR-CON  Take 1 tablet (20 mEq total) by mouth daily.     rivaroxaban 20 MG Tabs tablet  Commonly known as:  XARELTO  Take 20 mg by mouth  daily with supper.        Disposition:     Duration of Discharge Encounter: Greater than 30 minutes including physician time.  Signed, Leonia Reeves.D.

## 2014-03-27 ENCOUNTER — Encounter (HOSPITAL_COMMUNITY): Payer: Self-pay | Admitting: Internal Medicine

## 2014-03-29 ENCOUNTER — Encounter (HOSPITAL_COMMUNITY): Payer: Self-pay | Admitting: Internal Medicine

## 2014-04-03 ENCOUNTER — Encounter (HOSPITAL_COMMUNITY): Payer: Self-pay | Admitting: Internal Medicine

## 2014-04-04 ENCOUNTER — Encounter: Payer: Self-pay | Admitting: *Deleted

## 2014-04-04 ENCOUNTER — Other Ambulatory Visit (INDEPENDENT_AMBULATORY_CARE_PROVIDER_SITE_OTHER): Payer: BC Managed Care – PPO

## 2014-04-04 ENCOUNTER — Ambulatory Visit (INDEPENDENT_AMBULATORY_CARE_PROVIDER_SITE_OTHER): Payer: BC Managed Care – PPO | Admitting: *Deleted

## 2014-04-04 DIAGNOSIS — Z79899 Other long term (current) drug therapy: Secondary | ICD-10-CM

## 2014-04-04 DIAGNOSIS — E119 Type 2 diabetes mellitus without complications: Secondary | ICD-10-CM

## 2014-04-04 DIAGNOSIS — I495 Sick sinus syndrome: Secondary | ICD-10-CM

## 2014-04-04 LAB — MDC_IDC_ENUM_SESS_TYPE_INCLINIC
Battery Impedance: 100 Ohm
Battery Remaining Longevity: 172 mo
Battery Voltage: 2.8 V
Brady Statistic AS VP Percent: 0 %
Date Time Interrogation Session: 20150930155448
Lead Channel Impedance Value: 556 Ohm
Lead Channel Pacing Threshold Amplitude: 0.75 V
Lead Channel Pacing Threshold Pulse Width: 0.4 ms
Lead Channel Pacing Threshold Pulse Width: 0.4 ms
Lead Channel Sensing Intrinsic Amplitude: 8 mV
Lead Channel Setting Pacing Amplitude: 1.5 V
Lead Channel Setting Pacing Amplitude: 3.5 V
Lead Channel Setting Pacing Pulse Width: 0.4 ms
Lead Channel Setting Sensing Sensitivity: 2.8 mV
MDC IDC MSMT LEADCHNL RA PACING THRESHOLD AMPLITUDE: 0.5 V
MDC IDC MSMT LEADCHNL RA SENSING INTR AMPL: 1.4 mV
MDC IDC MSMT LEADCHNL RV IMPEDANCE VALUE: 592 Ohm
MDC IDC STAT BRADY AP VP PERCENT: 0 %
MDC IDC STAT BRADY AP VS PERCENT: 7 %
MDC IDC STAT BRADY AS VS PERCENT: 92 %

## 2014-04-04 NOTE — Progress Notes (Signed)
Wound check appointment. Steri-strips removed. Wound without redness or edema. Incision edges approximated, wound well healed. Normal device function. Thresholds, sensing, and impedances consistent with implant measurements. Device programmed at 3.5V/auto capture programmed on for extra safety margin until 3 month visit. Histogram distribution appropriate for patient and level of activity. 1 mode switch, + xarelto.  No high ventricular rates noted. Patient educated about wound care, arm mobility, lifting restrictions. ROV in 3 months with implanting physician.

## 2014-04-05 LAB — BASIC METABOLIC PANEL
BUN: 15 mg/dL (ref 6–23)
CO2: 21 mEq/L (ref 19–32)
Calcium: 9.2 mg/dL (ref 8.4–10.5)
Chloride: 109 mEq/L (ref 96–112)
Creatinine, Ser: 1.4 mg/dL (ref 0.4–1.5)
GFR: 64.96 mL/min (ref >60.00)
Glucose, Bld: 192 mg/dL — ABNORMAL HIGH (ref 70–99)
Potassium: 4 mEq/L (ref 3.5–5.1)
Sodium: 136 mEq/L (ref 135–145)

## 2014-04-09 ENCOUNTER — Encounter (HOSPITAL_COMMUNITY): Payer: Self-pay | Admitting: Internal Medicine

## 2014-04-19 ENCOUNTER — Encounter: Payer: Self-pay | Admitting: Internal Medicine

## 2014-05-14 ENCOUNTER — Other Ambulatory Visit: Payer: Self-pay | Admitting: *Deleted

## 2014-05-14 ENCOUNTER — Other Ambulatory Visit: Payer: Self-pay | Admitting: Geriatric Medicine

## 2014-05-14 ENCOUNTER — Telehealth: Payer: Self-pay | Admitting: Internal Medicine

## 2014-05-14 MED ORDER — RIVAROXABAN 20 MG PO TABS: 20.0000 mg | ORAL_TABLET | Freq: Every day | ORAL | Status: DC

## 2014-05-14 MED ORDER — LISINOPRIL-HYDROCHLOROTHIAZIDE 10-12.5 MG PO TABS: 1.0000 | ORAL_TABLET | Freq: Every day | ORAL | Status: DC

## 2014-05-14 MED ORDER — GLIPIZIDE 5 MG PO TABS: 5.0000 mg | ORAL_TABLET | Freq: Two times a day (BID) | ORAL | Status: DC

## 2014-05-14 NOTE — Telephone Encounter (Signed)
Patient called requesting 90 day supplies of Glipizide, Lisinopril, and test strips for his one touch ultra.  Pt uses [facility] [address]

## 2014-05-14 NOTE — Telephone Encounter (Signed)
Sent to [facility] on [address].   

## 2014-06-06 ENCOUNTER — Ambulatory Visit (INDEPENDENT_AMBULATORY_CARE_PROVIDER_SITE_OTHER): Payer: Self-pay | Admitting: Family Medicine

## 2014-06-06 VITALS — BP 132/82 | HR 95 | Temp 98.6°F | Resp 16 | Ht 70.5 in | Wt 180.0 lb

## 2014-06-06 DIAGNOSIS — Z024 Encounter for examination for driving license: Secondary | ICD-10-CM

## 2014-06-06 DIAGNOSIS — Z029 Encounter for administrative examinations, unspecified: Secondary | ICD-10-CM

## 2014-06-06 NOTE — Progress Notes (Signed)
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 He has a history of diabetes mellitus which is been in satisfactory control. The last A1c was 7.7. His usual sugars run in a good range fasting. He checks his sugars almost daily. He did have his pacemaker replaced. He has a history of sick sinus syndrome for which he wears the pacemaker. He has done fine since then. Last December I got a letter from his cardiologist documenting it was fine for him to keep driving, and we talked about the need for getting another such letter.  Past medical history: Pacemaker for atrial fibrillation Diabetes, controlled Hypertension, controlled Surgeries: Only the pacemaker Regular medications: See chart Allergies: None  Social history: Patient is married. Drives for many years. He is planning to retire in another year probably.  Review of systems Constitutional: Unremarkable Cardiovascular: Unremarkable Respiratory: Unremarkable HEENT: Unremarkable Gastrointestinal: Unremarkable Genitourinary: Unremarkable The skeletal: Unremarkable Neurologic: No major problems with neuropathy Musculoskeletal unremarkable Dermatologic: Unremarkable Neurologic: Unremarkable Psychiatric: Unremarkable   Physical exam: Healthy-appearing gentleman, healthy weight. His TMs are normal. Eyes PERRLA. Arcus senilis. Throat clear. Neck supple without nodes thyromegaly. Chest clear to auscultation. Heart regular without murmurs gallops or arrhythmias. No carotid bruits. Abdomen soft without mass or tenderness. Normal male external genitalia with testes descended. No hernias. Extremities unremarkable. Skin unremarkable.  Assessment: DOT examination History of atrial fibrillation and probable SSS Pacemaker Diabetes type 2, well-controlled Hypertension, controlled  Plan: Patient is given a with three-month car until he can bring a  new letter from his cardiologist. However at that point we will give him a card good for the remainder of a one-year cycle. If he brings me that  letter do not need to see him.

## 2014-06-06 NOTE — Patient Instructions (Signed)
Bring a letter from your cardiologist documenting that you are cleared for continued driving.  You will need your card renewed before the next 3 months with this documentation.  Return as necessary

## 2014-06-07 ENCOUNTER — Telehealth: Payer: Self-pay | Admitting: Internal Medicine

## 2014-06-07 NOTE — Telephone Encounter (Signed)
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 Please call and pt will come pick up note

## 2014-06-08 NOTE — Telephone Encounter (Signed)
Patient informed that his return to work letter is ready to pick up.

## 2014-06-08 NOTE — Telephone Encounter (Signed)
F/u ° ° ° °Pt calling concerning previous message. °

## 2014-06-08 NOTE — Telephone Encounter (Signed)
re

## 2014-06-12 ENCOUNTER — Telehealth: Payer: Self-pay

## 2014-06-12 NOTE — Telephone Encounter (Signed)
Patient wanting to know if Dr. Alwyn RenHopper has received a letter from Dr Graciela HusbandsKlein (Cardiologist) releasing him to go back to work? Patient states they were suppose to fax it to our office end of last week. He was given a temporary DOT card for 3months and is trying to get it changed to 1 year. Please contact patient back at [phone]

## 2014-06-18 NOTE — Telephone Encounter (Signed)
Patient calling back to check to see if our office received a letter from Dr Graciela HusbandsKlein (Cardiolgist). Per patient it was clearance for him to get his DOT card extended. Patients call back number is [phone]

## 2014-06-20 NOTE — Telephone Encounter (Signed)
Dr. Alwyn RenHopper,  Dr. Graciela HusbandsKlein did write a letter of clearance for Michael Snyder. Under chart review in letters you can see the letter he wrote. Please advise.  Thank you.

## 2014-06-20 NOTE — Telephone Encounter (Signed)
Pt calling back regarding this issue. Please advise.

## 2014-06-21 ENCOUNTER — Ambulatory Visit: Payer: Self-pay

## 2014-06-22 NOTE — Telephone Encounter (Signed)
Pt picked his DOT card yesterday.

## 2014-06-22 NOTE — Telephone Encounter (Signed)
I believe he has picked this up already last couple of days.

## 2014-06-28 ENCOUNTER — Ambulatory Visit (INDEPENDENT_AMBULATORY_CARE_PROVIDER_SITE_OTHER): Payer: BC Managed Care – PPO | Admitting: Internal Medicine

## 2014-06-28 ENCOUNTER — Encounter: Payer: Self-pay | Admitting: Internal Medicine

## 2014-06-28 VITALS — BP 98/60 | HR 82 | Ht 70.5 in | Wt 186.6 lb

## 2014-06-28 DIAGNOSIS — I495 Sick sinus syndrome: Secondary | ICD-10-CM

## 2014-06-28 DIAGNOSIS — I480 Paroxysmal atrial fibrillation: Secondary | ICD-10-CM

## 2014-06-28 LAB — MDC_IDC_ENUM_SESS_TYPE_INCLINIC
Battery Impedance: 100 Ohm
Battery Remaining Longevity: 176 mo
Battery Voltage: 2.8 V
Brady Statistic AP VS Percent: 7 %
Brady Statistic AS VP Percent: 1 %
Date Time Interrogation Session: 20151224101715
Lead Channel Impedance Value: 493 Ohm
Lead Channel Pacing Threshold Amplitude: 0.5 V
Lead Channel Pacing Threshold Amplitude: 0.75 V
Lead Channel Pacing Threshold Pulse Width: 0.4 ms
Lead Channel Pacing Threshold Pulse Width: 0.4 ms
Lead Channel Setting Pacing Amplitude: 2.5 V
Lead Channel Setting Sensing Sensitivity: 2 mV
MDC IDC MSMT LEADCHNL RA SENSING INTR AMPL: 0.7 mV
MDC IDC MSMT LEADCHNL RV IMPEDANCE VALUE: 521 Ohm
MDC IDC MSMT LEADCHNL RV SENSING INTR AMPL: 5.6 mV
MDC IDC SET LEADCHNL RA PACING AMPLITUDE: 2 V
MDC IDC SET LEADCHNL RV PACING PULSEWIDTH: 0.4 ms
MDC IDC STAT BRADY AP VP PERCENT: 0 %
MDC IDC STAT BRADY AS VS PERCENT: 92 %

## 2014-06-28 NOTE — Patient Instructions (Signed)
Your physician recommends that you continue on your current medications as directed. Please refer to the Current Medication list given to you today.  Your physician wants you to follow-up in: 9 months with Dr. Logan BoresKlein  You will receive a reminder letter in the mail two months in advance. If you don't receive a letter, please call our office to schedule the follow-up appointment.  Send a carelink transmission on 09/26/14

## 2014-06-28 NOTE — Progress Notes (Signed)
Patient Care Team: Judie BonusElizabeth A Kollar, MD as PCP - General (Internal Medicine)   HPI  Cherylann BanasWalter Snyder is a 63 y.o. male seen in followup for pacemaker implanted 2008 because of symptomatic sinus node dysfunction and profound nocturnal bradycardia not felt to be related to sleep apnea. He has had no further symptoms of lightheadedness following device implantation.   The patient denies SOB, chest pain, edema or palpitations    Evaluation of his left ventricular function by echo in 2008 demonstrated an EF of 55-65%.   Nonsustained ventricular tachycardia was noted on his device. We undertook an echo today demonstrated that the excess looping of the lead had migrated from the innominate vein and the right atrium the right ventricle.   In 9/15 he underwent extraction of his previously implanted RV lead which become dislodged with  insertion of a new lead  And he has a history of a retinal event that was thought to be possibly thromboembolic. He had atrial high rate episodes detected on his device and the concern was that this represented atrial fibrillation. He was started on Rivaroxaban which he is tolerating; renal function was normal 9/15    Past Medical History  Diagnosis Date  . Alcohol abuse   . 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 Diabetes mellitus, type 2   . Seizure disorder   . Pacemaker     Medtronic Adapta [id number]  . History of alcohol abuse   . Hypertension   . Head injury, closed, without LOC 1980s  . Seizures     last time in 1990's  . Arthritis     Past Surgical History  Procedure Laterality Date  . Laceration repair  1980    scalp  . 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 Pacemaker lead removal N/A 03/23/2014    Procedure: PACEMAKER LEAD REMOVAL;  Surgeon: Marinus MawGregg W Taylor, MD;  [facility];  Service: Cardiovascular;  Laterality: N/A;    Current Outpatient Prescriptions  Medication Sig Dispense Refill  . ALPHAGAN P 0.1 % SOLN Place 2 drops into  the left eye daily.    . Calcium Carbonate-Vitamin D (CALTRATE 600+D) 600-400 MG-UNIT per tablet Take 1 tablet by mouth 2 (two) times daily. 180 tablet 3  . gabapentin (NEURONTIN) 300 MG capsule Take 300 mg by mouth every 6 (six) hours as needed (pain).     [name]. glipiZIDE (GLUCOTROL) 5 MG tablet Take 1 tablet (5 mg total) by mouth 2 (two) times daily before a meal. 180 tablet 3  . lisinopril-hydrochlorothiazide (PRINZIDE,ZESTORETIC) 10-12.5 MG per tablet Take 1 tablet by mouth daily. 90 tablet 3  . meloxicam (MOBIC) 15 MG tablet Take 1 tablet (15 mg total) by mouth daily. 90 tablet 3  . Multiple Vitamin (MULTIVITAMIN WITH MINERALS) TABS tablet Take 1 tablet by mouth daily.    . Omega-3 Fatty Acids (FISH OIL) 300 MG CAPS Take 1 capsule (300 mg total) by mouth daily. 90 capsule 3  . ONETOUCH VERIO test strip   6  . potassium chloride SA (K-DUR,KLOR-CON) 20 MEQ tablet Take 1 tablet (20 mEq total) by mouth 2 (two) times daily. 60 tablet 6  . rivaroxaban (XARELTO) 20 MG TABS tablet Take 1 tablet (20 mg total) by mouth daily with supper. 90 tablet 1   No current facility-administered medications for this visit.    No Known Allergies  Review of Systems negative except from HPI and  PMH  Physical Exam Ht 5' 10.5" (1.791 m)  Wt 186 lb 9.6 oz (84.641 kg)  BMI 26.39 kg/m2 Well developed and well nourished in no acute distress HENT normal E scleral and icterus clear Neck Supple JVP flat; carotids brisk and full Clear to ausculation Device pocket well healed; without hematoma or erythema.  There is no tetheringRegular rate and rhythm, no murmurs gallops or rub Soft with active bowel sounds No clubbing cyanosis  Edema Alert and oriented, grossly normal motor and sensory function Skin Warm and Dry  ECG demonstrates sinus rhythm at 72 intervals 29/09/36     Assessment and  Plan  Sinus bradycardia  TIA  No intercurrent events or detected atrial fibrillation   Pacemaker-Medtronic The patient's  device was interrogated.  The information was reviewed. No changes were made in the programming.    Ventricular tachycardia  Mechanical  resolved  Doing beautifully

## 2014-07-17 ENCOUNTER — Other Ambulatory Visit: Payer: Self-pay | Admitting: Geriatric Medicine

## 2014-07-17 ENCOUNTER — Telehealth: Payer: Self-pay | Admitting: Internal Medicine

## 2014-07-17 MED ORDER — GLUCOSE BLOOD VI STRP: ORAL_STRIP | Status: DC

## 2014-07-17 NOTE — Telephone Encounter (Signed)
Sent to pharmacy 

## 2014-07-17 NOTE — Telephone Encounter (Signed)
Patient is advising that his   glucose blood (ONETOUCH VERIO IQ) test strip [[id number]][[id number]] DISCONTINUED    Was unable to be refilled because he his pharmacy advised no refills left. Please have refills sent in to [facility] . Patient also requests that we call a dosage of more than 25 strips. He runs out before the month ends.

## 2014-09-27 ENCOUNTER — Ambulatory Visit (INDEPENDENT_AMBULATORY_CARE_PROVIDER_SITE_OTHER): Payer: BC Managed Care – PPO | Admitting: *Deleted

## 2014-09-27 DIAGNOSIS — I495 Sick sinus syndrome: Secondary | ICD-10-CM

## 2014-09-27 LAB — MDC_IDC_ENUM_SESS_TYPE_REMOTE
Battery Impedance: 111 Ohm
Brady Statistic AP VS Percent: 6 %
Brady Statistic AS VP Percent: 1 %
Date Time Interrogation Session: 20160324120355
Lead Channel Impedance Value: 523 Ohm
Lead Channel Impedance Value: 552 Ohm
Lead Channel Sensing Intrinsic Amplitude: 5.6 mV
Lead Channel Setting Pacing Amplitude: 2 V
MDC IDC MSMT BATTERY REMAINING LONGEVITY: 157 mo
MDC IDC MSMT BATTERY VOLTAGE: 2.8 V
MDC IDC MSMT LEADCHNL RA PACING THRESHOLD AMPLITUDE: 0.625 V
MDC IDC MSMT LEADCHNL RA PACING THRESHOLD PULSEWIDTH: 0.4 ms
MDC IDC MSMT LEADCHNL RA SENSING INTR AMPL: 0.7 mV
MDC IDC MSMT LEADCHNL RV PACING THRESHOLD AMPLITUDE: 0.625 V
MDC IDC MSMT LEADCHNL RV PACING THRESHOLD PULSEWIDTH: 0.4 ms
MDC IDC SET LEADCHNL RV PACING AMPLITUDE: 2.5 V
MDC IDC SET LEADCHNL RV PACING PULSEWIDTH: 0.4 ms
MDC IDC SET LEADCHNL RV SENSING SENSITIVITY: 2.8 mV
MDC IDC STAT BRADY AP VP PERCENT: 0 %
MDC IDC STAT BRADY AS VS PERCENT: 93 %

## 2014-09-27 NOTE — Progress Notes (Signed)
Remote pacemaker transmission.   

## 2014-10-10 ENCOUNTER — Encounter: Payer: Self-pay | Admitting: Cardiology

## 2014-10-15 ENCOUNTER — Telehealth: Payer: Self-pay | Admitting: Internal Medicine

## 2014-10-15 NOTE — Telephone Encounter (Signed)
Will forward to Dr Graciela HusbandsKlein and Dory HornSherri Price, RN to discuss when form in received

## 2014-10-15 NOTE — Telephone Encounter (Signed)
New message     Patient calling advise to call the Dr. Marlowe AschoffKhemshara  - have form fax over to be complete for eye surgery on [phone]. Fax number was given to patient

## 2014-10-16 NOTE — Telephone Encounter (Signed)
Called to inform patient that Dr. Graciela HusbandsKlein is out of the country and will not return to the office until the 19th.  Advised that he will not be able to give clearance before then.  Patient inquiring if Dr. Ladona Ridgelaylor might be willing to give clearance instead - informed that I will address it with him but he may defer judgement to Dr. Graciela HusbandsKlein.  Patient is aware and verbalized understanding.

## 2014-10-18 ENCOUNTER — Encounter: Payer: Self-pay | Admitting: Internal Medicine

## 2014-10-18 DIAGNOSIS — H2512 Age-related nuclear cataract, left eye: Secondary | ICD-10-CM | POA: Diagnosis not present

## 2014-10-18 DIAGNOSIS — H269 Unspecified cataract: Secondary | ICD-10-CM | POA: Insufficient documentation

## 2014-10-19 DIAGNOSIS — Z7901 Long term (current) use of anticoagulants: Secondary | ICD-10-CM | POA: Diagnosis not present

## 2014-10-19 DIAGNOSIS — Z950 Presence of cardiac pacemaker: Secondary | ICD-10-CM | POA: Diagnosis not present

## 2014-10-19 DIAGNOSIS — I10 Essential (primary) hypertension: Secondary | ICD-10-CM | POA: Diagnosis not present

## 2014-10-19 DIAGNOSIS — H2512 Age-related nuclear cataract, left eye: Secondary | ICD-10-CM | POA: Diagnosis not present

## 2014-10-19 DIAGNOSIS — E119 Type 2 diabetes mellitus without complications: Secondary | ICD-10-CM | POA: Diagnosis not present

## 2014-10-19 DIAGNOSIS — I4891 Unspecified atrial fibrillation: Secondary | ICD-10-CM | POA: Diagnosis not present

## 2014-10-26 ENCOUNTER — Telehealth: Payer: Self-pay | Admitting: Internal Medicine

## 2014-10-26 MED ORDER — GLUCOSE BLOOD VI STRP
1.0000 | ORAL_STRIP | Freq: Two times a day (BID) | Status: DC
Start: 2014-10-26 — End: 2015-11-08

## 2014-10-26 MED ORDER — ONETOUCH VERIO IQ SYSTEM W/DEVICE KIT: PACK | Status: DC

## 2014-10-26 MED ORDER — ONETOUCH LANCETS MISC: Status: DC

## 2014-10-26 NOTE — Telephone Encounter (Signed)
Sent onetouch verio monitor w[facility]...Raechel Chute/lmb

## 2014-10-26 NOTE — Telephone Encounter (Signed)
Pt called in and wanted to know if his Meter and his glucose blood (ONETOUCH VERIO) test strip [[id number]][[id number]] could be called in to his [facility].  He is aware that Dr Dorise Hisskollar is out of office til tues

## 2014-10-30 NOTE — Telephone Encounter (Signed)
Was calling to inform patient that we still have not received clearance request for surgery. Patient states that he already had surgery, he guesses he didn't need clearance for it.

## 2014-11-01 ENCOUNTER — Other Ambulatory Visit (INDEPENDENT_AMBULATORY_CARE_PROVIDER_SITE_OTHER): Payer: Medicare Other

## 2014-11-01 ENCOUNTER — Encounter: Payer: Self-pay | Admitting: Internal Medicine

## 2014-11-01 ENCOUNTER — Ambulatory Visit (INDEPENDENT_AMBULATORY_CARE_PROVIDER_SITE_OTHER): Payer: Medicare Other | Admitting: Internal Medicine

## 2014-11-01 VITALS — BP 110/82 | HR 78 | Temp 98.3°F | Resp 14 | Ht 72.0 in | Wt 182.0 lb

## 2014-11-01 DIAGNOSIS — E876 Hypokalemia: Secondary | ICD-10-CM | POA: Diagnosis not present

## 2014-11-01 DIAGNOSIS — Z0000 Encounter for general adult medical examination without abnormal findings: Secondary | ICD-10-CM

## 2014-11-01 DIAGNOSIS — Z23 Encounter for immunization: Secondary | ICD-10-CM

## 2014-11-01 DIAGNOSIS — Z79899 Other long term (current) drug therapy: Secondary | ICD-10-CM | POA: Diagnosis not present

## 2014-11-01 DIAGNOSIS — E119 Type 2 diabetes mellitus without complications: Secondary | ICD-10-CM

## 2014-11-01 LAB — LIPID PANEL
CHOL/HDL RATIO: 3
Cholesterol: 134 mg/dL (ref 0–200)
HDL: 44.1 mg/dL (ref >39.00)
LDL Cholesterol: 73 mg/dL (ref 0–99)
NonHDL: 89.9
Triglycerides: 86 mg/dL (ref 0.0–149.0)
VLDL: 17.2 mg/dL (ref 0.0–40.0)

## 2014-11-01 LAB — BASIC METABOLIC PANEL
BUN: 15 mg/dL (ref 6–23)
CHLORIDE: 108 meq/L (ref 96–112)
CO2: 24 meq/L (ref 19–32)
CREATININE: 1.33 mg/dL (ref 0.40–1.50)
Calcium: 9.7 mg/dL (ref 8.4–10.5)
GFR: 69.37 mL/min (ref >60.00)
Glucose, Bld: 96 mg/dL (ref 70–99)
POTASSIUM: 4.3 meq/L (ref 3.5–5.1)
Sodium: 136 mEq/L (ref 135–145)

## 2014-11-01 LAB — COMPREHENSIVE METABOLIC PANEL
ALT: 33 U/L (ref 0–53)
AST: 32 U/L (ref 0–37)
Albumin: 4.4 g/dL (ref 3.5–5.2)
Alkaline Phosphatase: 70 U/L (ref 39–117)
BUN: 15 mg/dL (ref 6–23)
CALCIUM: 9.7 mg/dL (ref 8.4–10.5)
CO2: 24 mEq/L (ref 19–32)
CREATININE: 1.33 mg/dL (ref 0.40–1.50)
Chloride: 108 mEq/L (ref 96–112)
GFR: 69.37 mL/min (ref >60.00)
Glucose, Bld: 96 mg/dL (ref 70–99)
Potassium: 4.3 mEq/L (ref 3.5–5.1)
Sodium: 136 mEq/L (ref 135–145)
Total Bilirubin: 1.1 mg/dL (ref 0.2–1.2)
Total Protein: 6.9 g/dL (ref 6.0–8.3)

## 2014-11-01 LAB — HEMOGLOBIN A1C: Hgb A1c MFr Bld: 8.1 % — ABNORMAL HIGH (ref 4.6–6.5)

## 2014-11-01 NOTE — Progress Notes (Signed)
Pre visit review using our clinic review tool, if applicable. No additional management support is needed unless otherwise documented below in the visit note. 

## 2014-11-01 NOTE — Patient Instructions (Signed)
We are going to check an HIV test as well as the cholesterol and the diabetes and the kidneys.   We have given you the pneumonia booster shot today which should help protect you against pneumonia.   We will see you back in about 6 months to check on the sugars.   Health Maintenance A healthy lifestyle and preventative care can promote health and wellness.  Maintain regular health, dental, and eye exams.  Eat a healthy diet. Foods like vegetables, fruits, whole grains, low-fat dairy products, and lean protein foods contain the nutrients you need and are low in calories. Decrease your intake of foods high in solid fats, added sugars, and salt. Get information about a proper diet from your health care provider, if necessary.  Regular physical exercise is one of the most important things you can do for your health. Most adults should get at least 150 minutes of moderate-intensity exercise (any activity that increases your heart rate and causes you to sweat) each week. In addition, most adults need muscle-strengthening exercises on 2 or more days a week.   Maintain a healthy weight. The body mass index (BMI) is a screening tool to identify possible weight problems. It provides an estimate of body fat based on height and weight. Your health care provider can find your BMI and can help you achieve or maintain a healthy weight. For males 20 years and older:  A BMI below 18.5 is considered underweight.  A BMI of 18.5 to 24.9 is normal.  A BMI of 25 to 29.9 is considered overweight.  A BMI of 30 and above is considered obese.  Maintain normal blood lipids and cholesterol by exercising and minimizing your intake of saturated fat. Eat a balanced diet with plenty of fruits and vegetables. Blood tests for lipids and cholesterol should begin at age 720 and be repeated every 5 years. If your lipid or cholesterol levels are high, you are over age 65, or you are at high risk for heart disease, you may need  your cholesterol levels checked more frequently.Ongoing high lipid and cholesterol levels should be treated with medicines if diet and exercise are not working.  If you smoke, find out from your health care provider how to quit. If you do not use tobacco, do not start.  Lung cancer screening is recommended for adults aged 55-80 years who are at high risk for developing lung cancer because of a history of smoking. A yearly low-dose CT scan of the lungs is recommended for people who have at least a 30-pack-year history of smoking and are current smokers or have quit within the past 15 years. A pack year of smoking is smoking an average of 1 pack of cigarettes a day for 1 year (for example, a 30-pack-year history of smoking could mean smoking 1 pack a day for 30 years or 2 packs a day for 15 years). Yearly screening should continue until the smoker has stopped smoking for at least 15 years. Yearly screening should be stopped for people who develop a health problem that would prevent them from having lung cancer treatment.  If you choose to drink alcohol, do not have more than 2 drinks per day. One drink is considered to be 12 oz (360 mL) of beer, 5 oz (150 mL) of wine, or 1.5 oz (45 mL) of liquor.  Avoid the use of street drugs. Do not share needles with anyone. Ask for help if you need support or instructions about stopping the use  of drugs.  High blood pressure causes heart disease and increases the risk of stroke. Blood pressure should be checked at least every 1-2 years. Ongoing high blood pressure should be treated with medicines if weight loss and exercise are not effective.  If you are 26-3 years old, ask your health care provider if you should take aspirin to prevent heart disease.  Diabetes screening involves taking a blood sample to check your fasting blood sugar level. This should be done once every 3 years after age 4 if you are at a normal weight and without risk factors for diabetes.  Testing should be considered at a younger age or be carried out more frequently if you are overweight and have at least 1 risk factor for diabetes.  Colorectal cancer can be detected and often prevented. Most routine colorectal cancer screening begins at the age of 32 and continues through age 20. However, your health care provider may recommend screening at an earlier age if you have risk factors for colon cancer. On a yearly basis, your health care provider may provide home test kits to check for hidden blood in the stool. A small camera at the end of a tube may be used to directly examine the colon (sigmoidoscopy or colonoscopy) to detect the earliest forms of colorectal cancer. Talk to your health care provider about this at age 34 when routine screening begins. A direct exam of the colon should be repeated every 5-10 years through age 86, unless early forms of precancerous polyps or small growths are found.  People who are at an increased risk for hepatitis B should be screened for this virus. You are considered at high risk for hepatitis B if:  You were born in a country where hepatitis B occurs often. Talk with your health care provider about which countries are considered high risk.  Your parents were born in a high-risk country and you have not received a shot to protect against hepatitis B (hepatitis B vaccine).  You have HIV or AIDS.  You use needles to inject street drugs.  You live with, or have sex with, someone who has hepatitis B.  You are a man who has sex with other men (MSM).  You get hemodialysis treatment.  You take certain medicines for conditions like cancer, organ transplantation, and autoimmune conditions.  Hepatitis C blood testing is recommended for all people born from [birth year] through 1966 and any individual with known risk factors for hepatitis C.  Healthy men should no longer receive prostate-specific antigen (PSA) blood tests as part of routine cancer screening.  Talk to your health care provider about prostate cancer screening.  Testicular cancer screening is not recommended for adolescents or adult males who have no symptoms. Screening includes self-exam, a health care provider exam, and other screening tests. Consult with your health care provider about any symptoms you have or any concerns you have about testicular cancer.  Practice safe sex. Use condoms and avoid high-risk sexual practices to reduce the spread of sexually transmitted infections (STIs).  You should be screened for STIs, including gonorrhea and chlamydia if:  You are sexually active and are younger than 24 years.  You are older than 24 years, and your health care provider tells you that you are at risk for this type of infection.  Your sexual activity has changed since you were last screened, and you are at an increased risk for chlamydia or gonorrhea. Ask your health care provider if you are at risk.  If you are at risk of being infected with HIV, it is recommended that you take a prescription medicine daily to prevent HIV infection. This is called pre-exposure prophylaxis (PrEP). You are considered at risk if:  You are a man who has sex with other men (MSM).  You are a heterosexual man who is sexually active with multiple partners.  You take drugs by injection.  You are sexually active with a partner who has HIV.  Talk with your health care provider about whether you are at high risk of being infected with HIV. If you choose to begin PrEP, you should first be tested for HIV. You should then be tested every 3 months for as long as you are taking PrEP.  Use sunscreen. Apply sunscreen liberally and repeatedly throughout the day. You should seek shade when your shadow is shorter than you. Protect yourself by wearing long sleeves, pants, a wide-brimmed hat, and sunglasses year round whenever you are outdoors.  Tell your health care provider of new moles or changes in moles,  especially if there is a change in shape or color. Also, tell your health care provider if a mole is larger than the size of a pencil eraser.  A one-time screening for abdominal aortic aneurysm (AAA) and surgical repair of large AAAs by ultrasound is recommended for men aged 65-75 years who are current or former smokers.  Stay current with your vaccines (immunizations). Document Released: 12/19/2007 Document Revised: 06/27/2013 Document Reviewed: 11/17/2010 [facility] Patient Information 2015 [place], [place]. This information is not intended to replace advice given to you by your health care provider. Make sure you discuss any questions you have with your health care provider.

## 2014-11-01 NOTE — Progress Notes (Signed)
   Subjective:    Patient ID: Michael BanasWalter Snyder, male    DOB: 05/19/1951, 64 y.o.   MRN: [id number]  HPI Here for medicare wellness, no new complaints. Please see A/P for status and treatment of chronic medical problems.   Diet: heart healthy Physical activity: sedentary Depression/mood screen: negative Hearing: intact to whispered voice Visual acuity: grossly normal, performs annual eye exam  ADLs: capable Fall risk: none Home safety: good Cognitive evaluation: intact to orientation, naming, recall and repetition EOL planning: adv directives discussed  I have personally reviewed and have noted 1. The patient's medical and social history - reviewed today no changes 2. Their use of alcohol, tobacco or illicit drugs 3. Their current medications and supplements 4. The patient's functional ability including ADL's, fall risks, home safety risks and hearing or visual impairment. 5. Diet and physical activities 6. Evidence for depression or mood disorders 7. Care team reviewed and updated (available in snapshot)  Review of Systems  Constitutional: Negative for fever, activity change, appetite change and fatigue.  HENT: Negative.   Eyes: Negative.   Respiratory: Negative for cough, chest tightness, shortness of breath and wheezing.   Cardiovascular: Negative for chest pain, palpitations and leg swelling.  Gastrointestinal: Negative for diarrhea, constipation and abdominal distention.  Skin: Negative.   Neurological: Negative.   Psychiatric/Behavioral: Negative.       Objective:   Physical Exam  Constitutional: He is oriented to person, place, and time. He appears well-developed and well-nourished.  HENT:  Head: Normocephalic and atraumatic.  Eyes: EOM are normal.  Neck: Normal range of motion.  Cardiovascular: Normal rate.   Pulmonary/Chest: Effort normal and breath sounds normal. No respiratory distress. He has no wheezes.  Abdominal: Soft. Bowel sounds are normal. He exhibits no  distension. There is no tenderness.  Musculoskeletal: He exhibits no edema.  Neurological: He is alert and oriented to person, place, and time.  Skin: Skin is warm and dry.   Filed Vitals:   11/01/14 1322  BP: 110/82  Pulse: 78  Temp: 98.3 F (36.8 C)  TempSrc: Oral  Resp: 14  Height: 6' (1.829 m)  Weight: 182 lb (82.555 kg)  SpO2: 99%      Assessment & Plan:  Prevnar 13 given at visit

## 2014-11-02 ENCOUNTER — Telehealth: Payer: Self-pay | Admitting: Internal Medicine

## 2014-11-02 LAB — HIV ANTIBODY (ROUTINE TESTING W REFLEX): HIV: NONREACTIVE

## 2014-11-02 NOTE — Telephone Encounter (Signed)
Advised pt he is HIV negative and all other labs normal except aic is 8.1 and to try to loose 5-10 pounds and exercise to help with glucose readings per dr Loann Quillkollars note

## 2014-11-02 NOTE — Telephone Encounter (Signed)
Patient received a call from us. Please call him with his lab results

## 2014-11-02 NOTE — Progress Notes (Signed)
Patient advised of all labs and to increase exercise and try to loose weight per dr Loann Quillkollars note

## 2014-11-04 NOTE — Assessment & Plan Note (Signed)
Shingles done, prevnar given today to complete pneumonia series, tetanus due in 2019, colonoscopy due in 2019. 10 year screening given to him today, screening for HIV as well today. Routine labs ordered.

## 2014-11-04 NOTE — Assessment & Plan Note (Signed)
Takes glipizide and lisinopril. Checking HgA1c today, no known complications. No hypoglycemia.

## 2014-11-05 DIAGNOSIS — H2511 Age-related nuclear cataract, right eye: Secondary | ICD-10-CM | POA: Diagnosis not present

## 2014-11-07 ENCOUNTER — Ambulatory Visit (INDEPENDENT_AMBULATORY_CARE_PROVIDER_SITE_OTHER): Payer: Medicare Other | Admitting: Family

## 2014-11-07 ENCOUNTER — Encounter: Payer: Self-pay | Admitting: Family

## 2014-11-07 VITALS — BP 122/90 | HR 82 | Temp 98.9°F | Resp 18 | Ht 72.0 in | Wt 180.4 lb

## 2014-11-07 DIAGNOSIS — J069 Acute upper respiratory infection, unspecified: Secondary | ICD-10-CM

## 2014-11-07 IMAGING — CR DG CHEST 1V PORT
1 series · 1 of 1 positions shown · non-contrast
Comparison: Radiograph 12/29/2013

CLINICAL DATA: Central line placement, postop

EXAM:
PORTABLE CHEST - 1 VIEW

[AP]
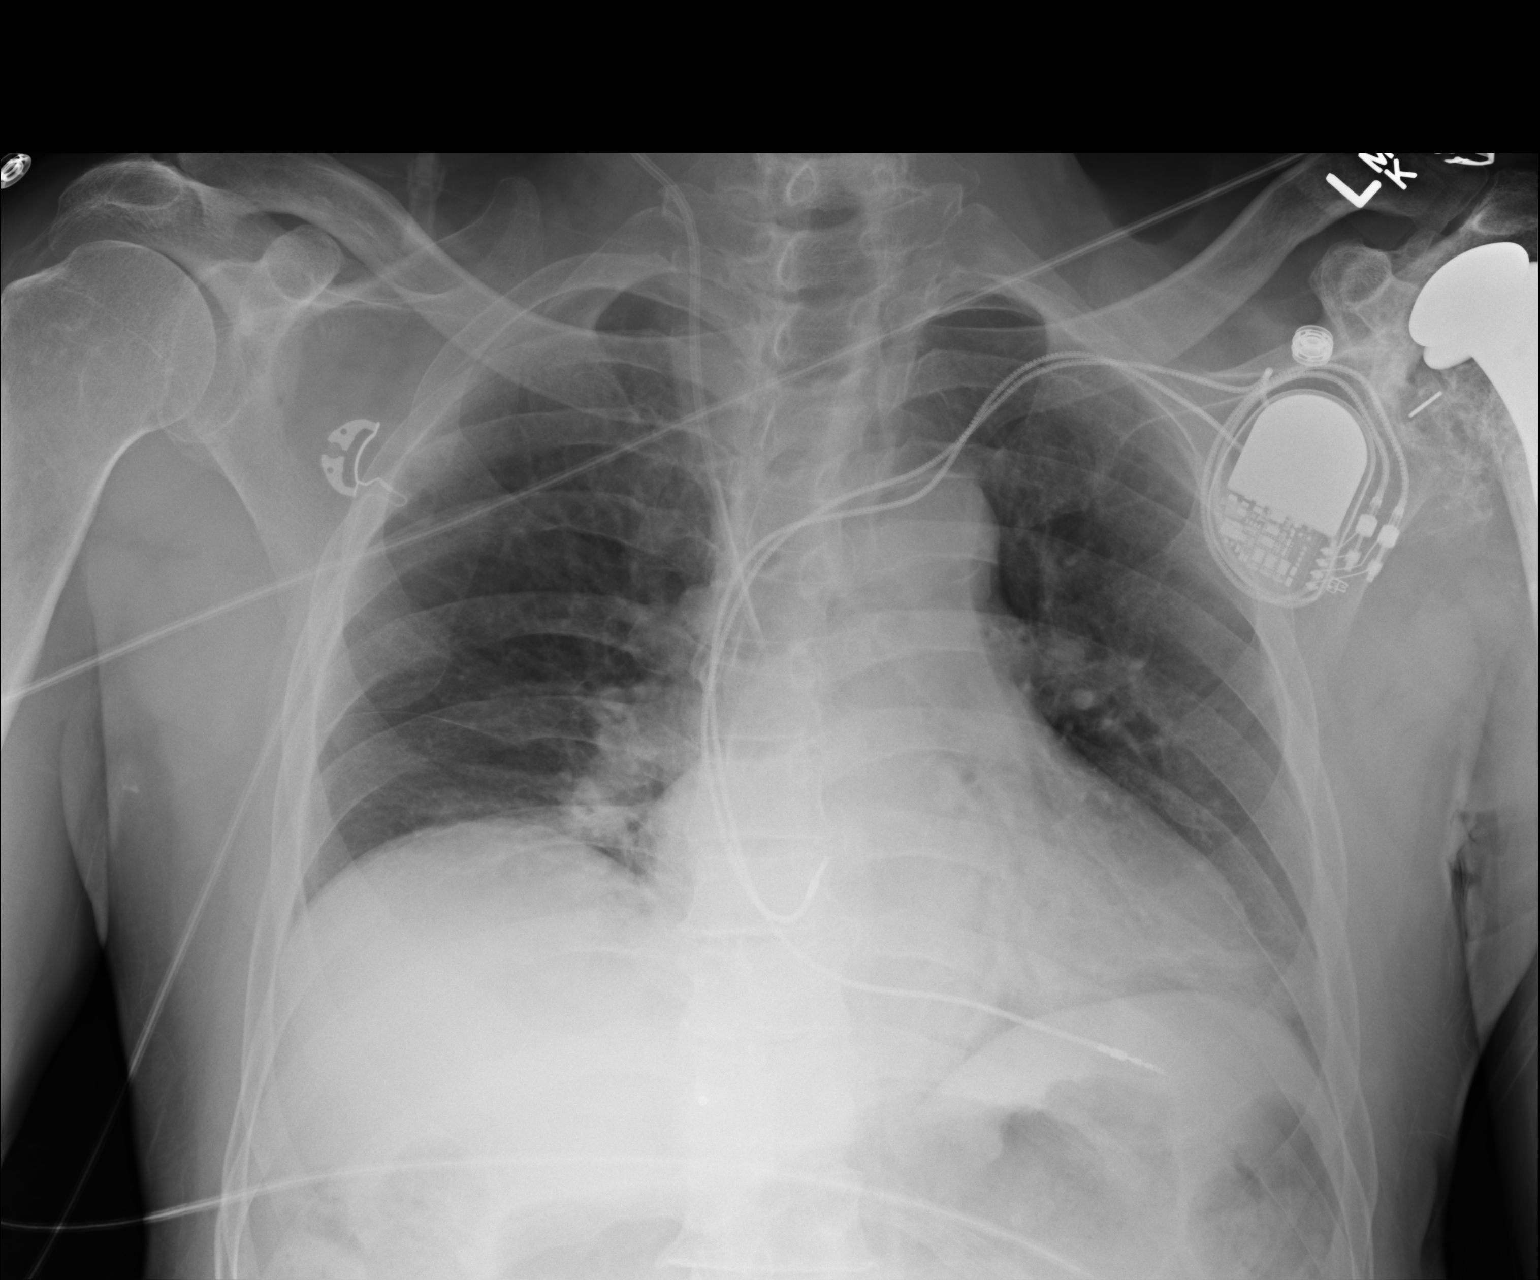

[1 of 1 positions shown; findings below may reference images not displayed]

FINDINGS: Exam is rotated leftward. Interval placement of central venous line
with tip projecting over the carina. No evidence of pneumothorax.
Left-sided pacemaker noted. Bibasilar atelectasis and low lung
volumes.
IMPRESSION: 1. Interval placement of right central venous line without
pneumothorax.
2. Right central venous line tip projects over the carinal which is
more medially than typically expected for the distal SVC. This is
likely secondary to patient rotation leftward. Recommend follow-up
radiograph with patient non rotated to exclude arterial placement.
Findings conveyed toPACU nurse [REDACTED] 03/23/2014  at[DATE].

## 2014-11-07 IMAGING — CR DG CHEST 1V PORT
1 series · 1 of 1 positions shown · non-contrast
Comparison: 03/23/2014 at 5913 hr

CLINICAL DATA: Central line placement.

EXAM:
PORTABLE CHEST - 1 VIEW

[AP]
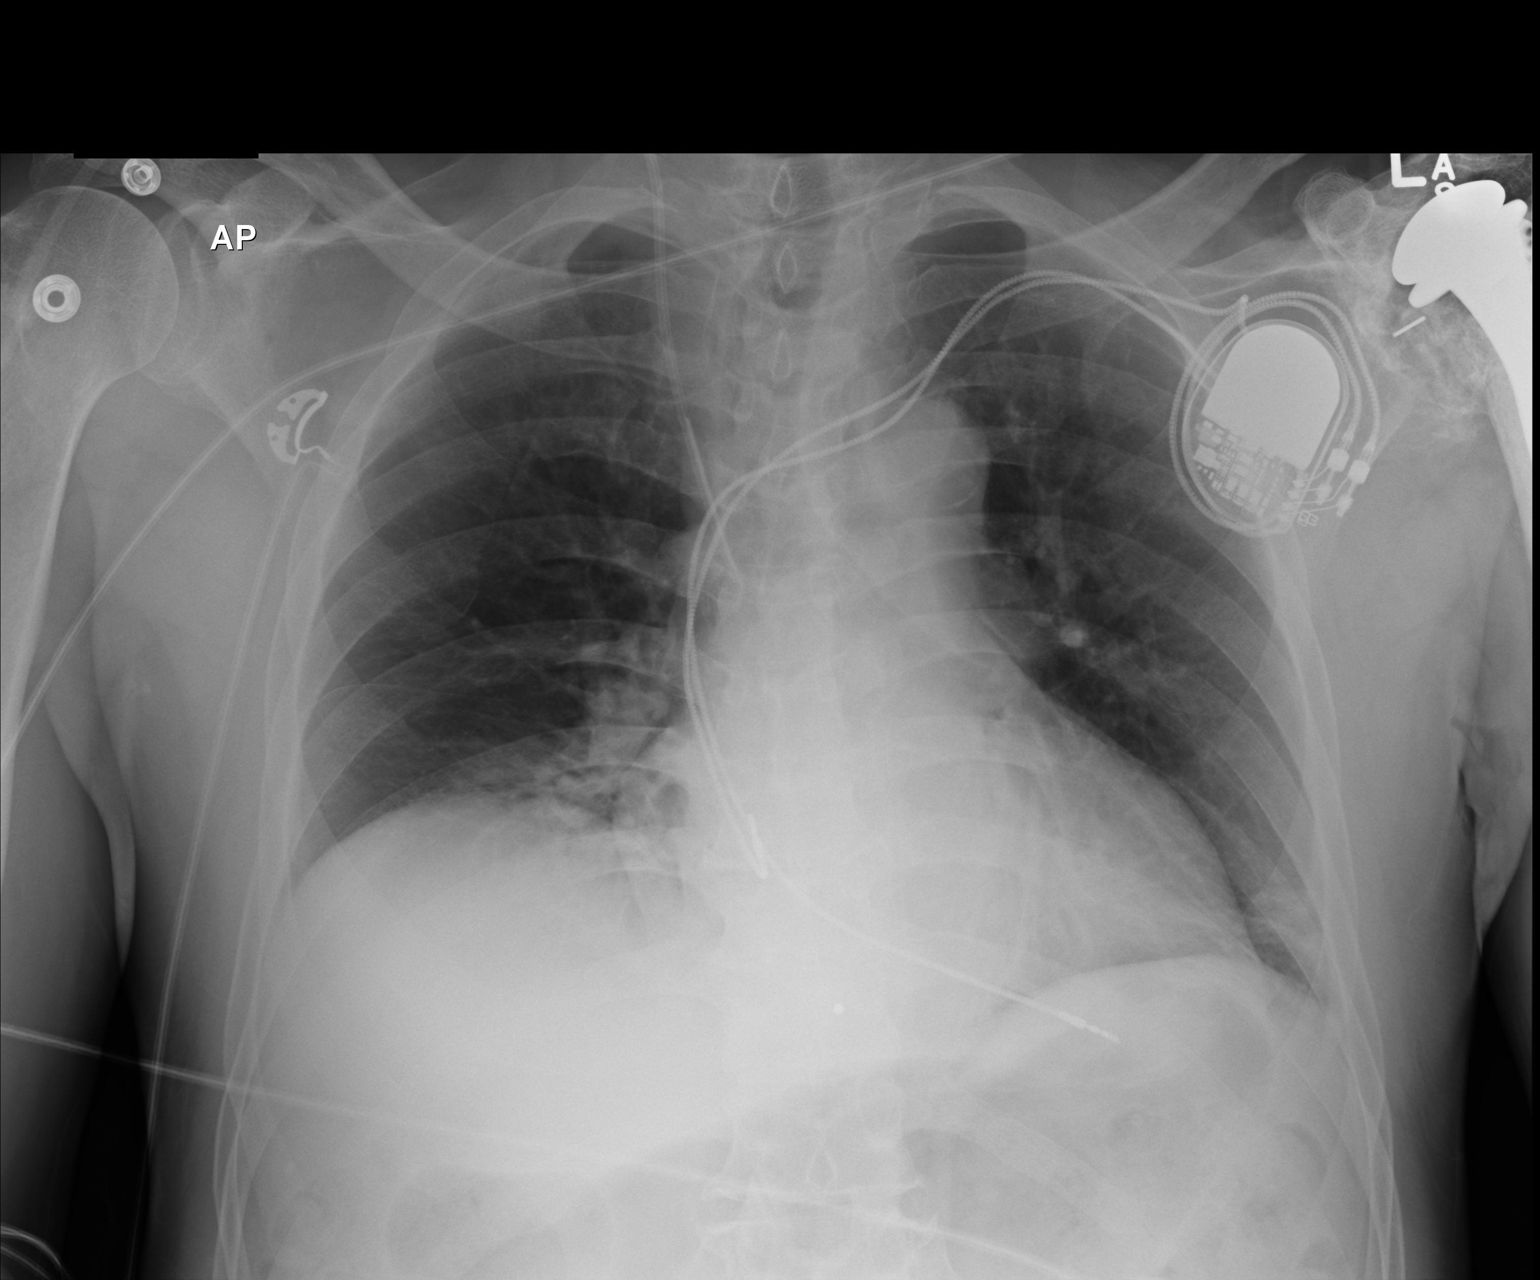

[1 of 1 positions shown; findings below may reference images not displayed]

FINDINGS: Right internal jugular central venous line has its tip in the lower
superior vena cava. No pneumothorax.

Mild persistent lung base atelectasis.  Lungs otherwise clear.
IMPRESSION: 1. Right internal jugular central venous line tip projects in the
lower superior vena cava not in the carina.

## 2014-11-07 MED ORDER — CEFUROXIME AXETIL 250 MG PO TABS: 250.0000 mg | ORAL_TABLET | Freq: Two times a day (BID) | ORAL | Status: DC

## 2014-11-07 MED ORDER — HYDROCODONE-HOMATROPINE 5-1.5 MG/5ML PO SYRP: 5.0000 mL | ORAL_SOLUTION | Freq: Three times a day (TID) | ORAL | Status: DC | PRN

## 2014-11-07 MED ORDER — BENZONATATE 100 MG PO CAPS: 100.0000 mg | ORAL_CAPSULE | Freq: Three times a day (TID) | ORAL | Status: DC | PRN

## 2014-11-07 NOTE — Patient Instructions (Addendum)
Thank you for choosing [facility].  Summary/Instructions:  Your prescription(s) have been submitted to your pharmacy or been printed and provided for you. Please take as directed and contact our office if you believe you are having problem(s) with the medication(s) or have any questions.  If your symptoms worsen or fail to improve, please contact our office for further instruction, or in case of emergency go directly to the emergency room at the closest medical facility.   Upper Respiratory Infection, Adult An upper respiratory infection (URI) is also sometimes known as the common cold. The upper respiratory tract includes the nose, sinuses, throat, trachea, and bronchi. Bronchi are the airways leading to the lungs. Most people improve within 1 week, but symptoms can last up to 2 weeks. A residual cough may last even longer.  CAUSES Many different viruses can infect the tissues lining the upper respiratory tract. The tissues become irritated and inflamed and often become very moist. Mucus production is also common. A cold is contagious. You can easily spread the virus to others by oral contact. This includes kissing, sharing a glass, coughing, or sneezing. Touching your mouth or nose and then touching a surface, which is then touched by another person, can also spread the virus. SYMPTOMS  Symptoms typically develop 1 to 3 days after you come in contact with a cold virus. Symptoms vary from person to person. They may include: 1. Runny nose. 2. Sneezing. 3. Nasal congestion. 4. Sinus irritation. 5. Sore throat. 6. Loss of voice (laryngitis). 7. Cough. 8. Fatigue. 9. Muscle aches. 10. Loss of appetite. 11. Headache. 12. Low-grade fever. DIAGNOSIS  You might diagnose your own cold based on familiar symptoms, since most people get a cold 2 to 3 times a year. Your caregiver can confirm this based on your exam. Most importantly, your caregiver can check that your symptoms are not due to  another disease such as strep throat, sinusitis, pneumonia, asthma, or epiglottitis. Blood tests, throat tests, and X-rays are not necessary to diagnose a common cold, but they may sometimes be helpful in excluding other more serious diseases. Your caregiver will decide if any further tests are required. RISKS AND COMPLICATIONS  You may be at risk for a more severe case of the common cold if you smoke cigarettes, have chronic heart disease (such as heart failure) or lung disease (such as asthma), or if you have a weakened immune system. The very young and very old are also at risk for more serious infections. Bacterial sinusitis, middle ear infections, and bacterial pneumonia can complicate the common cold. The common cold can worsen asthma and chronic obstructive pulmonary disease (COPD). Sometimes, these complications can require emergency medical care and may be life-threatening. PREVENTION  The best way to protect against getting a cold is to practice good hygiene. Avoid oral or hand contact with people with cold symptoms. Wash your hands often if contact occurs. There is no clear evidence that vitamin C, vitamin E, echinacea, or exercise reduces the chance of developing a cold. However, it is always recommended to get plenty of rest and practice good nutrition. TREATMENT  Treatment is directed at relieving symptoms. There is no cure. Antibiotics are not effective, because the infection is caused by a virus, not by bacteria. Treatment may include: 1. Increased fluid intake. Sports drinks offer valuable electrolytes, sugars, and fluids. 2. Breathing heated mist or steam (vaporizer or shower). 3. Eating chicken soup or other clear broths, and maintaining good nutrition. 4. Getting plenty of rest.   5. Using gargles or lozenges for comfort. 6. Controlling fevers with ibuprofen or acetaminophen as directed by your caregiver. 7. Increasing usage of your inhaler if you have asthma. Zinc gel and zinc  lozenges, taken in the first 24 hours of the common cold, can shorten the duration and lessen the severity of symptoms. Pain medicines may help with fever, muscle aches, and throat pain. A variety of non-prescription medicines are available to treat congestion and runny nose. Your caregiver can make recommendations and may suggest nasal or lung inhalers for other symptoms.  HOME CARE INSTRUCTIONS  1. Only take over-the-counter or prescription medicines for pain, discomfort, or fever as directed by your caregiver. 2. Use a warm mist humidifier or inhale steam from a shower to increase air moisture. This may keep secretions moist and make it easier to breathe. 3. Drink enough water and fluids to keep your urine clear or pale yellow. 4. Rest as needed. 5. Return to work when your temperature has returned to normal or as your caregiver advises. You may need to stay home longer to avoid infecting others. You can also use a face mask and careful hand washing to prevent spread of the virus. SEEK MEDICAL CARE IF:  1. After the first few days, you feel you are getting worse rather than better. 2. You need your caregiver's advice about medicines to control symptoms. 3. You develop chills, worsening shortness of breath, or brown or red sputum. These may be signs of pneumonia. 4. You develop yellow or brown nasal discharge or pain in the face, especially when you bend forward. These may be signs of sinusitis. 5. You develop a fever, swollen neck glands, pain with swallowing, or white areas in the back of your throat. These may be signs of strep throat. SEEK IMMEDIATE MEDICAL CARE IF:  1. You have a fever. 2. You develop severe or persistent headache, ear pain, sinus pain, or chest pain. 3. You develop wheezing, a prolonged cough, cough up blood, or have a change in your usual mucus (if you have chronic lung disease). 4. You develop sore muscles or a stiff neck. Document Released: 12/16/2000 Document Revised:  09/14/2011 Document Reviewed: 09/27/2013 ExitCare Patient Information 2015 [employer]. This information is not intended to replace advice given to you by your health care provider. Make sure you discuss any questions you have with your health care provider.  General Recommendations:    Please drink plenty of fluids.  Get plenty of rest   Sleep in humidified air  Use saline nasal sprays  Netti pot   OTC Medications:  Decongestants - helps relieve congestion   Flonase (generic fluticasone) or Nasacort (generic triamcinolone) - please make sure to use the "cross-over" technique at a 45 degree angle towards the opposite eye as opposed to straight up the nasal passageway.   If you have HIGH BLOOD PRESSURE - Coricidin HBP; AVOID any product that is -D as this contains pseudoephedrine which may increase your blood pressure.  Afrin (oxymetazoline) every 6-8 hours for up to 3 days.   Allergies - helps relieve runny nose, itchy eyes and sneezing   Claritin (generic loratidine), Allegra (fexofenidine), or Zyrtec (generic cyrterizine) for runny nose. These medications should not cause drowsiness.  Note - Benadryl (generic diphenhydramine) may be used however may cause drowsiness  Cough -   Delsym or Robitussin (generic dextromethorphan)  Expectorants - helps loosen mucus to ease removal   Mucinex (generic guaifenesin) as directed on the package.  Headaches / General   Aches   Tylenol (generic acetaminophen) - DO NOT EXCEED 3 grams (3,000 mg) in a 24 hour time period  Advil/Motrin (generic ibuprofen)   Sore Throat -   Salt water gargle   Chloraseptic (generic benzocaine) spray or lozenges / Sucrets (generic dyclonine)      

## 2014-11-07 NOTE — Progress Notes (Signed)
Subjective:    Patient ID: Michael Snyder, male    DOB: September 18, 1950, 64 y.o.   MRN: [id number]  Chief Complaint  Patient presents with  . Cough    x2 days, little productive cough, congestion, drainage, headache    HPI:  Michael Snyder is a 64 y.o. male with a PMH of hypertension, sick sinus syndrome, atrial fibrillation, type 2 diabetes, and a seizure disorder who presents today for an acute office visit.  This is a new problem. Associated symptom of cough, congestion, drainage and headache for about 2 days now. Severity of the symptoms is enough to wake him up at night. States that he has progressively worsened over the past 2 days. Denies any modifying factors including OTC medications. Denies any recent antibiotic use.   No Known Allergies   Current Outpatient Prescriptions on File Prior to Visit  Medication Sig Dispense Refill  . ALPHAGAN P 0.1 % SOLN Place 2 drops into the left eye daily.    . Blood Glucose Monitoring Suppl (ONETOUCH VERIO IQ SYSTEM) W/DEVICE KIT Use to check blood sugars twice a day Dx e11.9 1 kit 0  . Calcium Carbonate-Vitamin D (CALTRATE 600+D) 600-400 MG-UNIT per tablet Take 1 tablet by mouth 2 (two) times daily. 180 tablet 3  . gabapentin (NEURONTIN) 300 MG capsule Take 300 mg by mouth every 6 (six) hours as needed (pain).     [name] glipiZIDE (GLUCOTROL) 5 MG tablet Take 1 tablet (5 mg total) by mouth 2 (two) times daily before a meal. 180 tablet 3  . glucose blood (ONETOUCH VERIO) test strip 1 each by Other route 2 (two) times daily. Test daily as directed E11.9 300 each 3  . lisinopril-hydrochlorothiazide (PRINZIDE,ZESTORETIC) 10-12.5 MG per tablet Take 1 tablet by mouth daily. 90 tablet 3  . Multiple Vitamin (MULTIVITAMIN WITH MINERALS) TABS tablet Take 1 tablet by mouth daily.    . Omega-3 Fatty Acids (FISH OIL) 300 MG CAPS Take 1 capsule (300 mg total) by mouth daily. 90 capsule 3  . ONE TOUCH LANCETS MISC Use to help check blood sugars twice a day Dx E11.9  200 each 1  . potassium chloride SA (K-DUR,KLOR-CON) 20 MEQ tablet Take 1 tablet (20 mEq total) by mouth 2 (two) times daily. 60 tablet 6  . rivaroxaban (XARELTO) 20 MG TABS tablet Take 1 tablet (20 mg total) by mouth daily with supper. 90 tablet 1   No current facility-administered medications on file prior to visit.    Review of Systems  Constitutional: Negative for fever.  HENT: Positive for congestion, sinus pressure, sneezing and sore throat.   Respiratory: Positive for cough. Negative for chest tightness and shortness of breath.   Neurological: Positive for headaches.      Objective:    BP 122/90 mmHg  Pulse 82  Temp(Src) 98.9 F (37.2 C) (Oral)  Resp 18  Ht 6' (1.829 m)  Wt 180 lb 6.4 oz (81.829 kg)  BMI 24.46 kg/m2  SpO2 98% Nursing note and vital signs reviewed.  Physical Exam  Constitutional: He is oriented to person, place, and time. He appears well-developed and well-nourished. No distress.  HENT:  Right Ear: Hearing, tympanic membrane, external ear and ear canal normal.  Left Ear: Hearing, tympanic membrane, external ear and ear canal normal.  Nose: Right sinus exhibits no maxillary sinus tenderness and no frontal sinus tenderness. Left sinus exhibits no maxillary sinus tenderness and no frontal sinus tenderness.  Mouth/Throat: Uvula is midline, oropharynx is clear and moist and  mucous membranes are normal.  Cardiovascular: Normal rate, regular rhythm, normal heart sounds and intact distal pulses.   Pulmonary/Chest: Effort normal and breath sounds normal.  Neurological: He is alert and oriented to person, place, and time.  Skin: Skin is warm and dry.  Psychiatric: He has a normal mood and affect. His behavior is normal. Judgment and thought content normal.       Assessment & Plan:

## 2014-11-07 NOTE — Assessment & Plan Note (Signed)
Symptoms and exam consistent with acute upper respiratory infection. Continue conservative management of symptoms with over-the-counter medications as needed. Patient provided prescription for Ceftin if symptoms worsen in next 2-3 days. Start Hycodan as needed for cough and sleep. Follow-up if symptoms worsen or fail to improve.

## 2014-11-07 NOTE — Progress Notes (Signed)
Pre visit review using our clinic review tool, if applicable. No additional management support is needed unless otherwise documented below in the visit note. 

## 2014-11-08 IMAGING — CR DG CHEST 2V
2 series · 2 of 2 positions shown · non-contrast
Comparison: 03/23/2014

CLINICAL DATA: Pacemaker insertion.

EXAM:
CHEST  2 VIEW

[w chest pa]
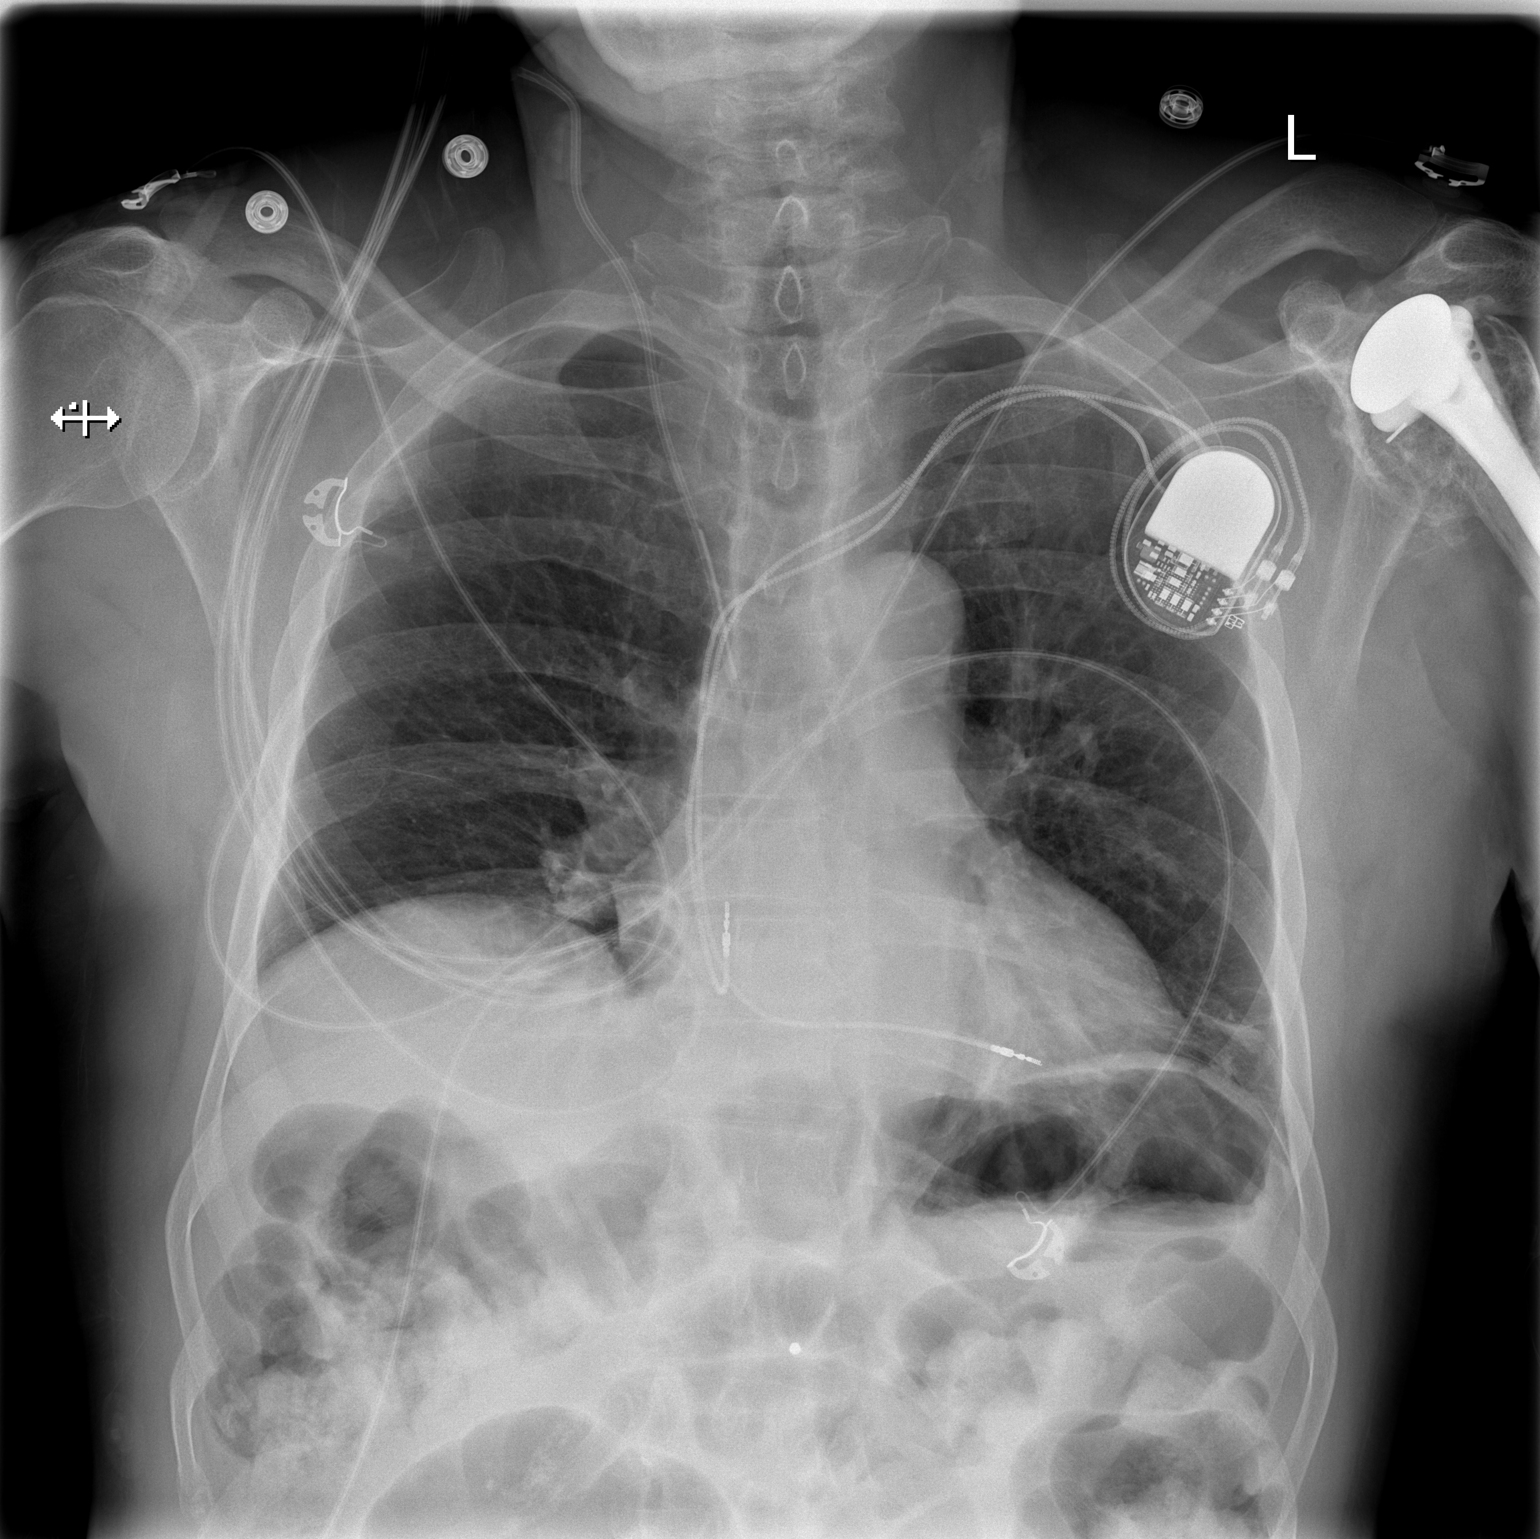

[w chest lat]
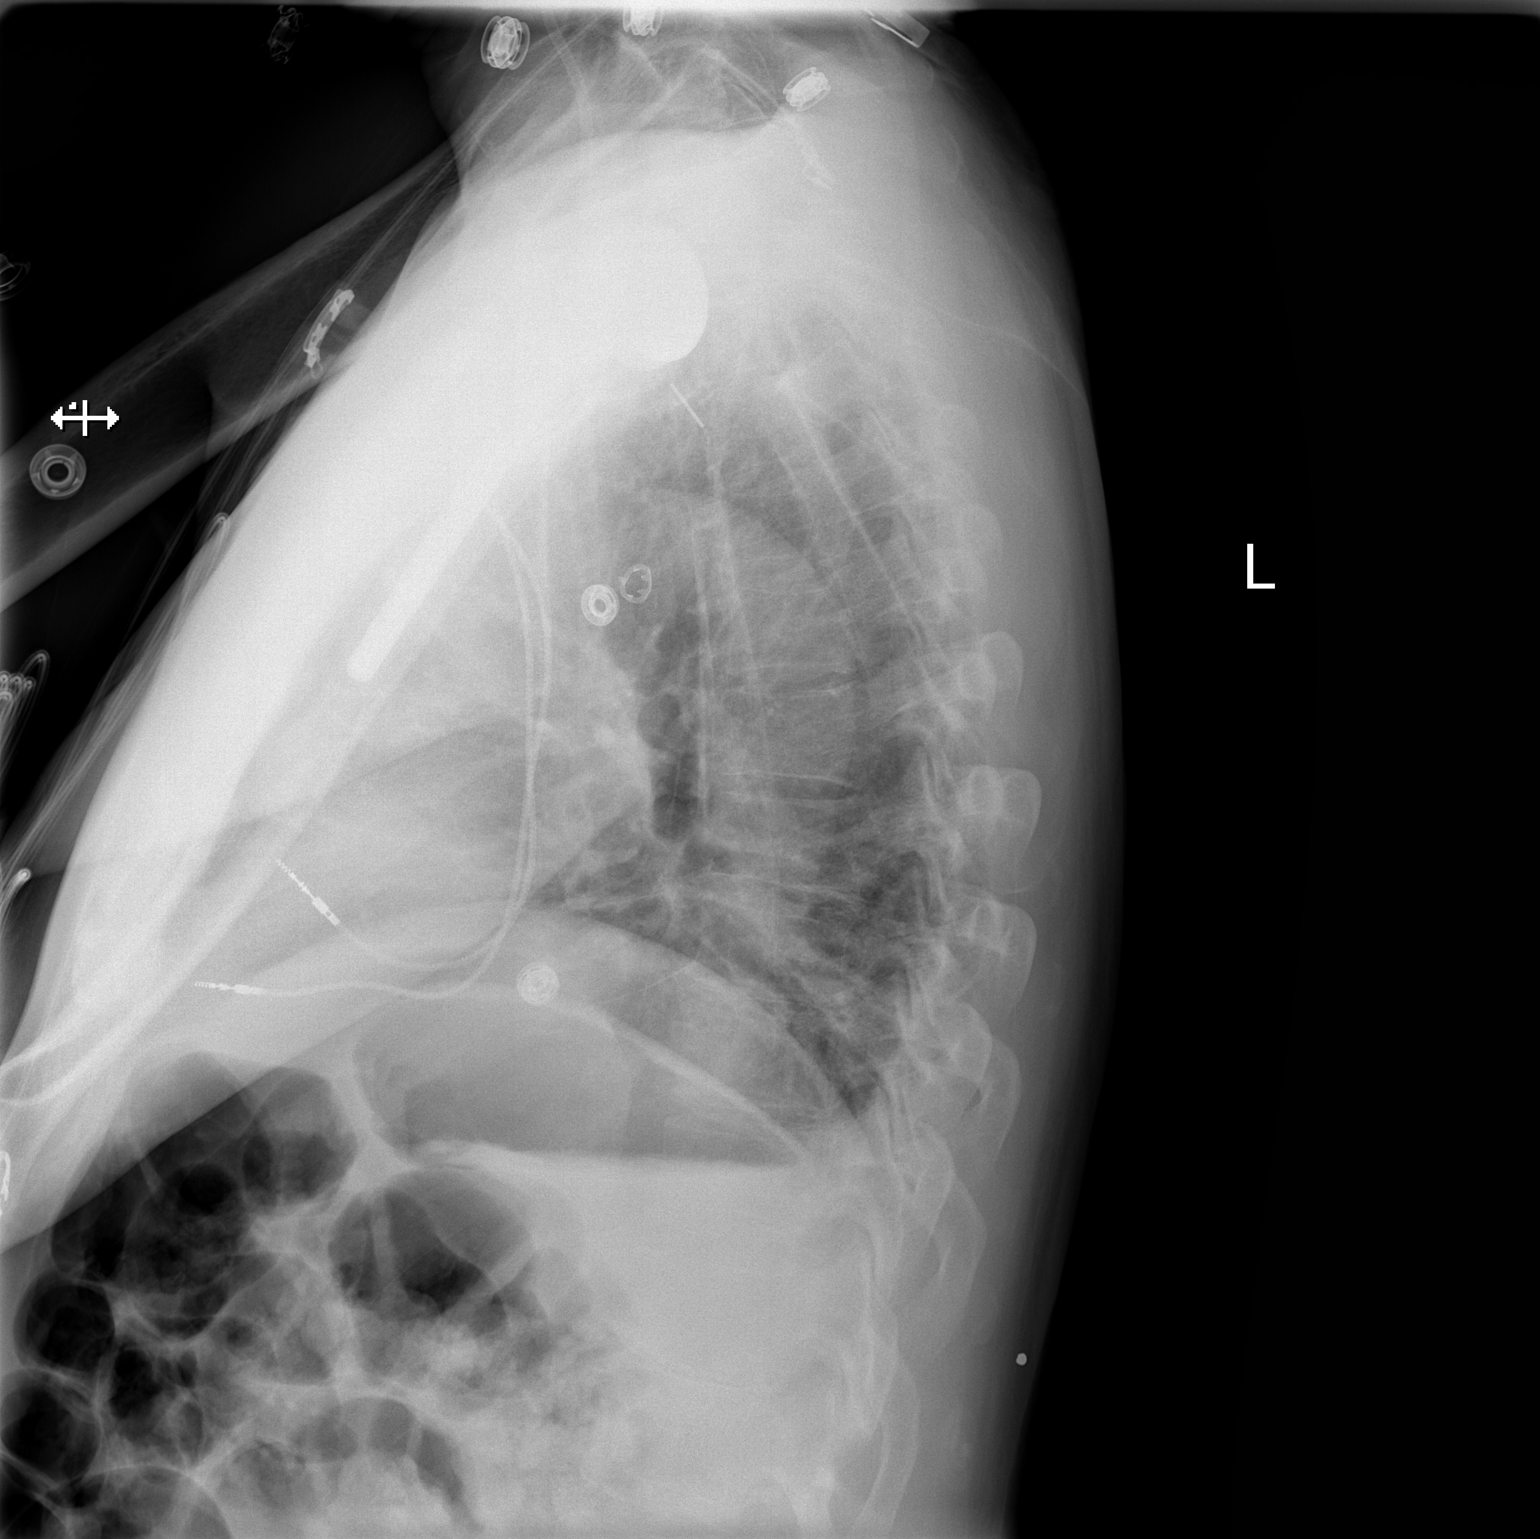

[2 of 2 positions shown; findings below may reference images not displayed]

FINDINGS: Left-sided dual lead pacemaker remains in place with tips projecting
over the right atrium and right ventricle. A right jugular central
venous catheter is unchanged with tip overlying the mid upper SVC.
Cardiac silhouette is upper limits of normal in size. There is
persistent elevation the right hemidiaphragm. Linear left basilar
opacities are consistent with minimal atelectasis. Right lung is
clear. No pleural effusion or pneumothorax is identified. No acute
osseous abnormality is identified. Mild gaseous distention of bowel
loops partially visualized in the upper abdomen. Prior left shoulder
replacement.
IMPRESSION: Mild left basilar atelectasis.  No pneumothorax.

## 2014-11-13 DIAGNOSIS — H269 Unspecified cataract: Secondary | ICD-10-CM | POA: Insufficient documentation

## 2014-11-14 DIAGNOSIS — Z79899 Other long term (current) drug therapy: Secondary | ICD-10-CM | POA: Diagnosis not present

## 2014-11-14 DIAGNOSIS — Z87898 Personal history of other specified conditions: Secondary | ICD-10-CM | POA: Diagnosis not present

## 2014-11-14 DIAGNOSIS — E119 Type 2 diabetes mellitus without complications: Secondary | ICD-10-CM | POA: Diagnosis not present

## 2014-11-14 DIAGNOSIS — I499 Cardiac arrhythmia, unspecified: Secondary | ICD-10-CM | POA: Diagnosis not present

## 2014-11-14 DIAGNOSIS — I4891 Unspecified atrial fibrillation: Secondary | ICD-10-CM | POA: Diagnosis not present

## 2014-11-14 DIAGNOSIS — Z792 Long term (current) use of antibiotics: Secondary | ICD-10-CM | POA: Diagnosis not present

## 2014-11-14 DIAGNOSIS — Z950 Presence of cardiac pacemaker: Secondary | ICD-10-CM | POA: Diagnosis not present

## 2014-11-14 DIAGNOSIS — H2511 Age-related nuclear cataract, right eye: Secondary | ICD-10-CM | POA: Diagnosis not present

## 2014-11-14 DIAGNOSIS — I10 Essential (primary) hypertension: Secondary | ICD-10-CM | POA: Diagnosis not present

## 2014-11-14 DIAGNOSIS — Z7952 Long term (current) use of systemic steroids: Secondary | ICD-10-CM | POA: Diagnosis not present

## 2014-11-14 DIAGNOSIS — Z7901 Long term (current) use of anticoagulants: Secondary | ICD-10-CM | POA: Diagnosis not present

## 2014-11-21 DIAGNOSIS — H2511 Age-related nuclear cataract, right eye: Secondary | ICD-10-CM | POA: Diagnosis not present

## 2014-12-14 ENCOUNTER — Other Ambulatory Visit: Payer: Self-pay | Admitting: Emergency Medicine

## 2014-12-14 ENCOUNTER — Telehealth: Payer: Self-pay | Admitting: Emergency Medicine

## 2014-12-14 MED ORDER — GLIPIZIDE 5 MG PO TABS: 5.0000 mg | ORAL_TABLET | Freq: Two times a day (BID) | ORAL | Status: DC

## 2014-12-14 MED ORDER — POTASSIUM CHLORIDE CRYS ER 20 MEQ PO TBCR: 20.0000 meq | EXTENDED_RELEASE_TABLET | Freq: Two times a day (BID) | ORAL | Status: DC

## 2014-12-14 MED ORDER — LISINOPRIL-HYDROCHLOROTHIAZIDE 10-12.5 MG PO TABS: 1.0000 | ORAL_TABLET | Freq: Every day | ORAL | Status: DC

## 2014-12-14 NOTE — Telephone Encounter (Signed)
Rx for Bing Ree was sent to pharm

## 2014-12-17 ENCOUNTER — Other Ambulatory Visit: Payer: Self-pay

## 2014-12-17 MED ORDER — GLIPIZIDE 5 MG PO TABS: 5.0000 mg | ORAL_TABLET | Freq: Two times a day (BID) | ORAL | Status: DC

## 2014-12-17 MED ORDER — POTASSIUM CHLORIDE CRYS ER 20 MEQ PO TBCR: 20.0000 meq | EXTENDED_RELEASE_TABLET | Freq: Two times a day (BID) | ORAL | Status: DC

## 2014-12-17 MED ORDER — RIVAROXABAN 20 MG PO TABS: 20.0000 mg | ORAL_TABLET | Freq: Every day | ORAL | Status: DC

## 2014-12-17 MED ORDER — LISINOPRIL-HYDROCHLOROTHIAZIDE 10-12.5 MG PO TABS: 1.0000 | ORAL_TABLET | Freq: Every day | ORAL | Status: DC

## 2014-12-18 DIAGNOSIS — Z961 Presence of intraocular lens: Secondary | ICD-10-CM | POA: Diagnosis not present

## 2014-12-30 ENCOUNTER — Encounter: Payer: Self-pay | Admitting: Internal Medicine

## 2014-12-31 ENCOUNTER — Ambulatory Visit (INDEPENDENT_AMBULATORY_CARE_PROVIDER_SITE_OTHER): Payer: Medicare Other | Admitting: *Deleted

## 2014-12-31 DIAGNOSIS — I495 Sick sinus syndrome: Secondary | ICD-10-CM

## 2014-12-31 NOTE — Progress Notes (Signed)
Remote pacemaker transmission.   

## 2015-01-03 LAB — CUP PACEART REMOTE DEVICE CHECK
Battery Impedance: 135 Ohm
Brady Statistic AP VS Percent: 8 %
Brady Statistic AS VS Percent: 91 %
Date Time Interrogation Session: 20160627023200
Lead Channel Impedance Value: 479 Ohm
Lead Channel Pacing Threshold Pulse Width: 0.4 ms
Lead Channel Sensing Intrinsic Amplitude: 0.7 mV
Lead Channel Setting Pacing Amplitude: 2 V
Lead Channel Setting Pacing Amplitude: 2.5 V
Lead Channel Setting Sensing Sensitivity: 2.8 mV
MDC IDC MSMT BATTERY REMAINING LONGEVITY: 149 mo
MDC IDC MSMT BATTERY VOLTAGE: 2.8 V
MDC IDC MSMT LEADCHNL RA PACING THRESHOLD AMPLITUDE: 0.625 V
MDC IDC MSMT LEADCHNL RA PACING THRESHOLD PULSEWIDTH: 0.4 ms
MDC IDC MSMT LEADCHNL RV IMPEDANCE VALUE: 533 Ohm
MDC IDC MSMT LEADCHNL RV PACING THRESHOLD AMPLITUDE: 0.625 V
MDC IDC MSMT LEADCHNL RV SENSING INTR AMPL: 5.6 mV
MDC IDC SET LEADCHNL RV PACING PULSEWIDTH: 0.4 ms
MDC IDC STAT BRADY AP VP PERCENT: 1 %
MDC IDC STAT BRADY AS VP PERCENT: 1 %

## 2015-02-08 ENCOUNTER — Encounter: Payer: Self-pay | Admitting: Cardiology

## 2015-02-21 ENCOUNTER — Telehealth: Payer: Self-pay

## 2015-02-21 NOTE — Telephone Encounter (Signed)
Prior auth sent to [facility] for approval of Xarelto , via Cover my Meds.

## 2015-02-21 NOTE — Telephone Encounter (Signed)
Xarelto  approved from [facility]. Good through 02/21/2016. Pharmacy notified.

## 2015-03-09 ENCOUNTER — Other Ambulatory Visit: Payer: Self-pay | Admitting: Internal Medicine

## 2015-04-22 ENCOUNTER — Other Ambulatory Visit: Payer: Self-pay | Admitting: Internal Medicine

## 2015-05-02 ENCOUNTER — Encounter: Payer: Self-pay | Admitting: Internal Medicine

## 2015-05-02 ENCOUNTER — Ambulatory Visit (INDEPENDENT_AMBULATORY_CARE_PROVIDER_SITE_OTHER): Payer: Medicare Other | Admitting: Internal Medicine

## 2015-05-02 VITALS — BP 112/90 | HR 68 | Ht 72.0 in | Wt 179.2 lb

## 2015-05-02 DIAGNOSIS — I4891 Unspecified atrial fibrillation: Secondary | ICD-10-CM

## 2015-05-02 DIAGNOSIS — I495 Sick sinus syndrome: Secondary | ICD-10-CM

## 2015-05-02 LAB — CUP PACEART INCLINIC DEVICE CHECK
Battery Remaining Longevity: 155 mo
Battery Voltage: 2.8 V
Brady Statistic AP VP Percent: 1 %
Brady Statistic AS VP Percent: 1 %
Brady Statistic AS VS Percent: 89 %
Date Time Interrogation Session: 20161027105941
Implantable Lead Implant Date: 20150918
Implantable Lead Location: 753859
Implantable Lead Model: 5076
Implantable Lead Model: 5076
Lead Channel Impedance Value: 504 Ohm
Lead Channel Pacing Threshold Amplitude: 0.5 V
Lead Channel Pacing Threshold Amplitude: 0.625 V
Lead Channel Pacing Threshold Pulse Width: 0.4 ms
Lead Channel Pacing Threshold Pulse Width: 0.4 ms
Lead Channel Sensing Intrinsic Amplitude: 8 mV
Lead Channel Setting Pacing Amplitude: 2 V
Lead Channel Setting Pacing Amplitude: 2.5 V
Lead Channel Setting Pacing Pulse Width: 0.4 ms
Lead Channel Setting Sensing Sensitivity: 2.8 mV
MDC IDC LEAD IMPLANT DT: 20080711
MDC IDC LEAD LOCATION: 753860
MDC IDC MSMT BATTERY IMPEDANCE: 111 Ohm
MDC IDC MSMT LEADCHNL RA IMPEDANCE VALUE: 452 Ohm
MDC IDC MSMT LEADCHNL RA PACING THRESHOLD AMPLITUDE: 0.625 V
MDC IDC MSMT LEADCHNL RA PACING THRESHOLD PULSEWIDTH: 0.4 ms
MDC IDC MSMT LEADCHNL RA SENSING INTR AMPL: 1 mV
MDC IDC MSMT LEADCHNL RV PACING THRESHOLD AMPLITUDE: 0.75 V
MDC IDC MSMT LEADCHNL RV PACING THRESHOLD PULSEWIDTH: 0.4 ms
MDC IDC STAT BRADY AP VS PERCENT: 10 %

## 2015-05-02 LAB — BASIC METABOLIC PANEL
BUN: 13 mg/dL (ref 7–25)
CO2: 22 mmol/L (ref 20–31)
Calcium: 9.3 mg/dL (ref 8.6–10.3)
Chloride: 108 mmol/L (ref 98–110)
Creat: 1.63 mg/dL — ABNORMAL HIGH (ref 0.70–1.25)
GLUCOSE: 162 mg/dL — AB (ref 65–99)
POTASSIUM: 3.9 mmol/L (ref 3.5–5.3)
SODIUM: 141 mmol/L (ref 135–146)

## 2015-05-02 LAB — MAGNESIUM: Magnesium: 2.1 mg/dL (ref 1.5–2.5)

## 2015-05-02 NOTE — Progress Notes (Signed)
Patient Care Team: Hoyt Koch, MD as PCP - General (Internal Medicine)   HPI  Michael Snyder is a 64 y.o. male seen in followup for pacemaker implanted 2008 because of symptomatic sinus node dysfunction and profound nocturnal bradycardia not felt to be related to sleep apnea. He has had no further symptoms of lightheadedness following device implantation.   The patient denies SOB, chest pain, edema or palpitations    Evaluation of his left ventricular function by echo in 2008 demonstrated an EF of 55-65%.   Nonsustained ventricular tachycardia was noted on his device. We undertook an echo today demonstrated that the excess looping of the lead had migrated from the innominate vein and the right atrium the right ventricle.   In 9/15 he underwent extraction of his previously implanted RV lead which become dislodged with  insertion of a new lead  And he has a history of a retinal event that was thought to be possibly thromboembolic. He had atrial high rate episodes detected on his device and the concern was that this represented atrial fibrillation. He was started on Rivaroxaban which he is tolerating; renal function was normal 9/15    Past Medical History  Diagnosis Date  . Alcohol abuse   . 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 Diabetes mellitus, type 2 ([place])   . Seizure disorder ([place])   . Pacemaker     [place]  . History of alcohol abuse   . Hypertension   . Head injury, closed, without LOC 1980s  . Seizures ([place])     last time in 1990's  . Arthritis     Past Surgical History  Procedure Laterality Date  . Laceration repair  1980    scalp  . 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 Pacemaker lead removal N/A 03/23/2014    Procedure: PACEMAKER LEAD REMOVAL;  Surgeon: Evans Lance, MD;  Location: [facility] OR;  Service: Cardiovascular;  Laterality: N/A;    Current Outpatient Prescriptions  Medication Sig Dispense Refill  . ALPHAGAN P 0.1 %  SOLN Place 2 drops into the left eye daily.    . Blood Glucose Monitoring Suppl (ONETOUCH VERIO IQ SYSTEM) W/DEVICE KIT Use to check blood sugars twice a day Dx e11.9 1 kit 0  . Calcium Carbonate-Vitamin D (CALTRATE 600+D) 600-400 MG-UNIT per tablet Take 1 tablet by mouth 2 (two) times daily. 180 tablet 3  . gabapentin (NEURONTIN) 300 MG capsule Take 300 mg by mouth every 6 (six) hours as needed (pain).     [name] glipiZIDE (GLUCOTROL) 5 MG tablet Take 1 tablet (5 mg total) by mouth 2 (two) times daily before a meal. 180 tablet 1  . glucose blood (ONETOUCH VERIO) test strip 1 each by Other route 2 (two) times daily. Test daily as directed E11.9 300 each 3  . lisinopril-hydrochlorothiazide (PRINZIDE,ZESTORETIC) 10-12.5 MG per tablet Take 1 tablet by mouth daily. 90 tablet 1  . Multiple Vitamin (MULTIVITAMIN WITH MINERALS) TABS tablet Take 1 tablet by mouth daily.    . Omega-3 Fatty Acids (FISH OIL) 300 MG CAPS Take 1 capsule (300 mg total) by mouth daily. 90 capsule 3  . ONETOUCH DELICA LANCETS 10R MISC Check blood sugar two times daily 200 each 5  . potassium chloride SA (K-DUR,KLOR-CON) 20 MEQ tablet Take 1 tablet (20 mEq total) by mouth 2 (two) times daily. 180 tablet 4  . rivaroxaban (XARELTO) 20  MG TABS tablet Take 1 tablet (20 mg total) by mouth daily with supper. 90 tablet 1   No current facility-administered medications for this visit.    No Known Allergies  Review of Systems negative except from HPI and PMH  Physical Exam BP 112/90 mmHg  Pulse 68  Ht 6' (1.829 m)  Wt 179 lb 3.2 oz (81.285 kg)  BMI 24.30 kg/m2 Well developed and well nourished in no acute distress HENT normal E scleral and icterus clear Neck Supple JVP flat; carotids brisk and full Clear to ausculation Device pocket well healed; without hematoma or erythema.  There is no tetheringRegular rate and rhythm, no murmurs gallops or rub Soft with active bowel sounds No clubbing cyanosis  Edema Alert and oriented,  grossly normal motor and sensory function Skin Warm and Dry  ECG demonstrates sinus rhythm at 72 intervals 29/09/36     Assessment and  Plan  Sinus bradycardia  TIA  No intercurrent events or detected atrial fibrillation   Pacemaker-Medtronic The patient's device was interrogated.  The information was reviewed. No changes were made in the programming.         Stable continue current meds

## 2015-05-02 NOTE — Patient Instructions (Addendum)
Medication Instructions: Your physician recommends that you continue on your current medications as directed. Please refer to the Current Medication list given to you today.     If you need a refill on your cardiac medications before your next appointment, please call your pharmacy.  Labwork: BMET AND MAG    Testing/Procedures: NONE ORDER TODAY    Follow-Up:  Remote monitoring is used to monitor your Pacemaker of ICD from home. This monitoring reduces the number of office visits required to check your device to one time per year. It allows us to keep an eye on the functioning of your device to ensure it is working properly. You are scheduled for a device check from home on . 1/26/ 17 You may send your transmission at any time that day. If you have a wireless device, the transmission will be sent automatically. After your physician reviews your transmission, you will receive a postcard with your next transmission date.   Your physician wants you to follow-up in: ONE YEAR WITH  DR Logan BoresKLEIN  You will receive a reminder letter in the mail two months in advance. If you don't receive a letter, please call our office to schedule the follow-up appointment.    Any Other Special Instructions Will Be Listed Below (If Applicable).

## 2015-05-03 ENCOUNTER — Ambulatory Visit: Payer: Medicare Other | Admitting: Internal Medicine

## 2015-05-14 ENCOUNTER — Encounter: Payer: Medicare Other | Admitting: Internal Medicine

## 2015-05-27 ENCOUNTER — Telehealth: Payer: Self-pay | Admitting: Internal Medicine

## 2015-05-27 NOTE — Telephone Encounter (Signed)
Walk in pt form-[place]-[facility] dropped off [facility]

## 2015-07-10 ENCOUNTER — Telehealth: Payer: Self-pay | Admitting: Internal Medicine

## 2015-07-10 MED ORDER — LISINOPRIL-HYDROCHLOROTHIAZIDE 10-12.5 MG PO TABS: 1.0000 | ORAL_TABLET | Freq: Every day | ORAL | Status: DC

## 2015-07-10 NOTE — Telephone Encounter (Signed)
Pt also call triage concerning msg below. He states [facility] suppose to be sending but hasn't recieve yet. Inform pt will send 30 day to [facility]...Raechel Chute/lmb

## 2015-07-10 NOTE — Telephone Encounter (Signed)
Pt request refill for lisinopril-hydrochlorothiazide (PRINZIDE,ZESTORETIC) 10-12.5 MG per tablet to be send to [facility]. Please help, he is out and the mailing pharmacy wont send it to him on time.

## 2015-08-01 ENCOUNTER — Telehealth: Payer: Self-pay | Admitting: Cardiology

## 2015-08-01 ENCOUNTER — Ambulatory Visit (INDEPENDENT_AMBULATORY_CARE_PROVIDER_SITE_OTHER): Payer: Medicare Other | Admitting: *Deleted

## 2015-08-01 DIAGNOSIS — I495 Sick sinus syndrome: Secondary | ICD-10-CM | POA: Diagnosis not present

## 2015-08-01 NOTE — Telephone Encounter (Signed)
Spoke with pt and reminded pt of remote transmission that is due today. Pt verbalized understanding.   

## 2015-08-02 NOTE — Progress Notes (Signed)
Remote pacemaker transmission.   

## 2015-08-14 LAB — CUP PACEART REMOTE DEVICE CHECK
Battery Remaining Longevity: 156 mo
Battery Voltage: 2.79 V
Brady Statistic AP VP Percent: 0 %
Brady Statistic AS VP Percent: 1 %
Date Time Interrogation Session: 20170127001902
Implantable Lead Implant Date: 20150918
Implantable Lead Model: 5076
Lead Channel Impedance Value: 500 Ohm
Lead Channel Pacing Threshold Amplitude: 0.625 V
Lead Channel Pacing Threshold Pulse Width: 0.4 ms
Lead Channel Pacing Threshold Pulse Width: 0.4 ms
Lead Channel Sensing Intrinsic Amplitude: 0.7 mV
Lead Channel Setting Pacing Amplitude: 2 V
Lead Channel Setting Pacing Amplitude: 2.5 V
Lead Channel Setting Pacing Pulse Width: 0.4 ms
Lead Channel Setting Sensing Sensitivity: 2.8 mV
MDC IDC LEAD IMPLANT DT: 20080711
MDC IDC LEAD LOCATION: 753859
MDC IDC LEAD LOCATION: 753860
MDC IDC MSMT BATTERY IMPEDANCE: 111 Ohm
MDC IDC MSMT LEADCHNL RA PACING THRESHOLD AMPLITUDE: 0.625 V
MDC IDC MSMT LEADCHNL RV IMPEDANCE VALUE: 522 Ohm
MDC IDC MSMT LEADCHNL RV SENSING INTR AMPL: 5.6 mV
MDC IDC STAT BRADY AP VS PERCENT: 14 %
MDC IDC STAT BRADY AS VS PERCENT: 85 %

## 2015-08-16 ENCOUNTER — Encounter: Payer: Self-pay | Admitting: Cardiology

## 2015-08-21 ENCOUNTER — Ambulatory Visit (INDEPENDENT_AMBULATORY_CARE_PROVIDER_SITE_OTHER): Payer: Medicare Other | Admitting: Internal Medicine

## 2015-08-21 ENCOUNTER — Encounter: Payer: Self-pay | Admitting: Internal Medicine

## 2015-08-21 ENCOUNTER — Other Ambulatory Visit (INDEPENDENT_AMBULATORY_CARE_PROVIDER_SITE_OTHER): Payer: Medicare Other

## 2015-08-21 ENCOUNTER — Ambulatory Visit (INDEPENDENT_AMBULATORY_CARE_PROVIDER_SITE_OTHER)
Admission: RE | Admit: 2015-08-21 | Discharge: 2015-08-21 | Disposition: A | Discharge status: Home / Self Care | Payer: Medicare Other | Source: Ambulatory Visit | Attending: Internal Medicine | Admitting: Internal Medicine

## 2015-08-21 VITALS — BP 122/88 | HR 86 | Temp 98.6°F | Resp 14 | Ht 72.0 in | Wt 180.0 lb

## 2015-08-21 DIAGNOSIS — E559 Vitamin D deficiency, unspecified: Secondary | ICD-10-CM

## 2015-08-21 DIAGNOSIS — Z23 Encounter for immunization: Secondary | ICD-10-CM | POA: Diagnosis not present

## 2015-08-21 DIAGNOSIS — E119 Type 2 diabetes mellitus without complications: Secondary | ICD-10-CM

## 2015-08-21 LAB — HEMOGLOBIN A1C: HEMOGLOBIN A1C: 7.7 % — AB (ref 4.6–6.5)

## 2015-08-21 LAB — CBC
HEMATOCRIT: 38.4 % — AB (ref 39.0–52.0)
HEMOGLOBIN: 12.9 g/dL — AB (ref 13.0–17.0)
MCHC: 33.7 g/dL (ref 30.0–36.0)
MCV: 86.4 fl (ref 78.0–100.0)
Platelets: 149 10*3/uL — ABNORMAL LOW (ref 150.0–400.0)
RBC: 4.44 Mil/uL (ref 4.22–5.81)
RDW: 13.3 % (ref 11.5–15.5)
WBC: 5.5 10*3/uL (ref 4.0–10.5)

## 2015-08-21 LAB — COMPREHENSIVE METABOLIC PANEL
ALBUMIN: 4.4 g/dL (ref 3.5–5.2)
ALT: 35 U/L (ref 0–53)
AST: 37 U/L (ref 0–37)
Alkaline Phosphatase: 82 U/L (ref 39–117)
BUN: 17 mg/dL (ref 6–23)
CHLORIDE: 106 meq/L (ref 96–112)
CO2: 25 mEq/L (ref 19–32)
Calcium: 9.6 mg/dL (ref 8.4–10.5)
Creatinine, Ser: 1.55 mg/dL — ABNORMAL HIGH (ref 0.40–1.50)
GFR: 57.99 mL/min — AB (ref >60.00)
GLUCOSE: 155 mg/dL — AB (ref 70–99)
POTASSIUM: 3.8 meq/L (ref 3.5–5.1)
SODIUM: 137 meq/L (ref 135–145)
Total Bilirubin: 0.7 mg/dL (ref 0.2–1.2)
Total Protein: 7.3 g/dL (ref 6.0–8.3)

## 2015-08-21 LAB — LIPID PANEL
Cholesterol: 146 mg/dL (ref 0–200)
HDL: 47 mg/dL (ref >39.00)
LDL Cholesterol: 86 mg/dL (ref 0–99)
NONHDL: 99.45
TRIGLYCERIDES: 69 mg/dL (ref 0.0–149.0)
Total CHOL/HDL Ratio: 3
VLDL: 13.8 mg/dL (ref 0.0–40.0)

## 2015-08-21 MED ORDER — ONETOUCH VERIO IQ SYSTEM W/DEVICE KIT: PACK | Status: DC

## 2015-08-21 NOTE — Progress Notes (Signed)
   Subjective:    Patient ID: Michael Snyder, male    DOB: 1950/09/02, 65 y.o.   MRN: [id number]  HPI The patient is a 65 YO man coming in for several concerns. He is worried about having leukemia since his brother just passed from that. He has not noticed any bruising, bleeding, lumps or bumps. He wants to have a bone density as well for the same reason. No breaks or falls, but has had some deficiencies over the years previously. Denies new complaints. No numbness in his feet, has not had his eyes checked.   Review of Systems  Constitutional: Negative for fever, activity change, appetite change and fatigue.  HENT: Negative.   Eyes: Negative.   Respiratory: Negative for cough, chest tightness, shortness of breath and wheezing.   Cardiovascular: Negative for chest pain, palpitations and leg swelling.  Gastrointestinal: Negative for diarrhea, constipation and abdominal distention.  Musculoskeletal: Negative.   Skin: Negative.   Neurological: Negative.   Psychiatric/Behavioral: Positive for dysphoric mood. Negative for suicidal ideas, behavioral problems, sleep disturbance, self-injury and decreased concentration. The patient is not nervous/anxious.       Objective:   Physical Exam  Constitutional: He is oriented to person, place, and time. He appears well-developed and well-nourished.  HENT:  Head: Normocephalic and atraumatic.  Eyes: EOM are normal.  Neck: Normal range of motion.  Cardiovascular: Normal rate.   Pulmonary/Chest: Effort normal and breath sounds normal. No respiratory distress. He has no wheezes.  Abdominal: Soft. Bowel sounds are normal. He exhibits no distension. There is no tenderness. There is no rebound.  Musculoskeletal: He exhibits no edema.  Neurological: He is alert and oriented to person, place, and time.  Skin: Skin is warm and dry.   Filed Vitals:   08/21/15 0813  BP: 122/88  Pulse: 86  Temp: 98.6 F (37 C)  TempSrc: Oral  Resp: 14  Height: 6' (1.829 m)   Weight: 180 lb (81.647 kg)  SpO2: 98%      Assessment & Plan:  Flu shot given at visit.

## 2015-08-21 NOTE — Assessment & Plan Note (Signed)
BP at goal but concerning that last creatinine was elevated with cardiology. Will recheck today. Taking hctz/lisinopril and adjust as needed. Checking CBC if new renal insufficiency.

## 2015-08-21 NOTE — Assessment & Plan Note (Signed)
Last HgA1c slightly elevated, rechecking today. No new complications noted. Reminded about eye exam. Adjust as needed.

## 2015-08-21 NOTE — Patient Instructions (Signed)
We will check the blood work today and call you back about the results.   We have ordered the bone density and they can get that scheduled in the next couple of weeks.   Come back in about 3-4 months for a physical.  Diabetes and Exercise Exercising regularly is important. It is not just about losing weight. It has many health benefits, such as:  Improving your overall fitness, flexibility, and endurance.  Increasing your bone density.  Helping with weight control.  Decreasing your body fat.  Increasing your muscle strength.  Reducing stress and tension.  Improving your overall health. People with diabetes who exercise gain additional benefits because exercise:  Reduces appetite.  Improves the body's use of blood sugar (glucose).  Helps lower or control blood glucose.  Decreases blood pressure.  Helps control blood lipids (such as cholesterol and triglycerides).  Improves the body's use of the hormone insulin by:  Increasing the body's insulin sensitivity.  Reducing the body's insulin needs.  Decreases the risk for heart disease because exercising:  Lowers cholesterol and triglycerides levels.  Increases the levels of good cholesterol (such as high-density lipoproteins [HDL]) in the body.  Lowers blood glucose levels. YOUR ACTIVITY PLAN  Choose an activity that you enjoy, and set realistic goals. To exercise safely, you should begin practicing any new physical activity slowly, and gradually increase the intensity of the exercise over time. Your health care provider or diabetes educator can help create an activity plan that works for you. General recommendations include:  Encouraging children to engage in at least 60 minutes of physical activity each day.  Stretching and performing strength training exercises, such as yoga or weight lifting, at least 2 times per week.  Performing a total of at least 150 minutes of moderate-intensity exercise each week, such as  brisk walking or water aerobics.  Exercising at least 3 days per week, making sure you allow no more than 2 consecutive days to pass without exercising.  Avoiding long periods of inactivity (90 minutes or more). When you have to spend an extended period of time sitting down, take frequent breaks to walk or stretch. RECOMMENDATIONS FOR EXERCISING WITH TYPE 1 OR TYPE 2 DIABETES   Check your blood glucose before exercising. If blood glucose levels are greater than 240 mg/dL, check for urine ketones. Do not exercise if ketones are present.  Avoid injecting insulin into areas of the body that are going to be exercised. For example, avoid injecting insulin into:  The arms when playing tennis.  The legs when jogging.  Keep a record of:  Food intake before and after you exercise.  Expected peak times of insulin action.  Blood glucose levels before and after you exercise.  The type and amount of exercise you have done.  Review your records with your health care provider. Your health care provider will help you to develop guidelines for adjusting food intake and insulin amounts before and after exercising.  If you take insulin or oral hypoglycemic agents, watch for signs and symptoms of hypoglycemia. They include:  Dizziness.  Shaking.  Sweating.  Chills.  Confusion.  Drink plenty of water while you exercise to prevent dehydration or heat stroke. Body water is lost during exercise and must be replaced.  Talk to your health care provider before starting an exercise program to make sure it is safe for you. Remember, almost any type of activity is better than none.   This information is not intended to replace advice given  to you by your health care provider. Make sure you discuss any questions you have with your health care provider.   Document Released: 09/12/2003 Document Revised: 11/06/2014 Document Reviewed: 11/29/2012 Elsevier Interactive Patient Education [employer].

## 2015-08-21 NOTE — Progress Notes (Signed)
Pre visit review using our clinic review tool, if applicable. No additional management support is needed unless otherwise documented below in the visit note. 

## 2015-08-24 ENCOUNTER — Other Ambulatory Visit: Payer: Self-pay | Admitting: Internal Medicine

## 2015-09-10 ENCOUNTER — Other Ambulatory Visit: Payer: Self-pay | Admitting: Internal Medicine

## 2015-10-10 ENCOUNTER — Telehealth: Payer: Self-pay | Admitting: Internal Medicine

## 2015-10-10 NOTE — Telephone Encounter (Signed)
Please follow up on lab results with patient

## 2015-10-11 NOTE — Telephone Encounter (Signed)
Patient aware of results.

## 2015-10-31 ENCOUNTER — Ambulatory Visit (INDEPENDENT_AMBULATORY_CARE_PROVIDER_SITE_OTHER): Payer: Medicare Other | Admitting: *Deleted

## 2015-10-31 ENCOUNTER — Telehealth: Payer: Self-pay | Admitting: Cardiology

## 2015-10-31 DIAGNOSIS — I495 Sick sinus syndrome: Secondary | ICD-10-CM

## 2015-10-31 NOTE — Telephone Encounter (Signed)
Spoke with pt and reminded pt of remote transmission that is due today. Pt verbalized understanding.   

## 2015-11-01 NOTE — Progress Notes (Signed)
Remote pacemaker transmission.   

## 2015-11-08 ENCOUNTER — Other Ambulatory Visit: Payer: Self-pay | Admitting: Internal Medicine

## 2015-11-18 ENCOUNTER — Encounter: Payer: Medicare Other | Admitting: Internal Medicine

## 2015-11-28 ENCOUNTER — Ambulatory Visit (INDEPENDENT_AMBULATORY_CARE_PROVIDER_SITE_OTHER): Payer: Medicare Other | Admitting: Internal Medicine

## 2015-11-28 ENCOUNTER — Other Ambulatory Visit (INDEPENDENT_AMBULATORY_CARE_PROVIDER_SITE_OTHER): Payer: Medicare Other

## 2015-11-28 ENCOUNTER — Encounter: Payer: Self-pay | Admitting: Internal Medicine

## 2015-11-28 VITALS — BP 122/70 | HR 94 | Temp 98.4°F | Resp 14 | Ht 72.0 in | Wt 179.0 lb

## 2015-11-28 DIAGNOSIS — E119 Type 2 diabetes mellitus without complications: Secondary | ICD-10-CM

## 2015-11-28 DIAGNOSIS — I10 Essential (primary) hypertension: Secondary | ICD-10-CM

## 2015-11-28 DIAGNOSIS — I4891 Unspecified atrial fibrillation: Secondary | ICD-10-CM

## 2015-11-28 DIAGNOSIS — Z0000 Encounter for general adult medical examination without abnormal findings: Secondary | ICD-10-CM

## 2015-11-28 DIAGNOSIS — Z1159 Encounter for screening for other viral diseases: Secondary | ICD-10-CM | POA: Diagnosis not present

## 2015-11-28 DIAGNOSIS — R945 Abnormal results of liver function studies: Secondary | ICD-10-CM | POA: Diagnosis not present

## 2015-11-28 LAB — COMPREHENSIVE METABOLIC PANEL
ALK PHOS: 73 U/L (ref 39–117)
ALT: 34 U/L (ref 0–53)
AST: 30 U/L (ref 0–37)
Albumin: 4 g/dL (ref 3.5–5.2)
BUN: 14 mg/dL (ref 6–23)
CO2: 24 mEq/L (ref 19–32)
CREATININE: 1.46 mg/dL (ref 0.40–1.50)
Calcium: 8.9 mg/dL (ref 8.4–10.5)
Chloride: 107 mEq/L (ref 96–112)
GFR: 62.08 mL/min (ref >60.00)
GLUCOSE: 349 mg/dL — AB (ref 70–99)
POTASSIUM: 3.5 meq/L (ref 3.5–5.1)
SODIUM: 136 meq/L (ref 135–145)
TOTAL PROTEIN: 6.5 g/dL (ref 6.0–8.3)
Total Bilirubin: 1 mg/dL (ref 0.2–1.2)

## 2015-11-28 LAB — LIPID PANEL
CHOL/HDL RATIO: 4
Cholesterol: 147 mg/dL (ref 0–200)
HDL: 38.5 mg/dL — ABNORMAL LOW (ref >39.00)
LDL CALC: 93 mg/dL (ref 0–99)
NONHDL: 108.12
Triglycerides: 78 mg/dL (ref 0.0–149.0)
VLDL: 15.6 mg/dL (ref 0.0–40.0)

## 2015-11-28 LAB — CBC
HEMATOCRIT: 36.2 % — AB (ref 39.0–52.0)
Hemoglobin: 12.1 g/dL — ABNORMAL LOW (ref 13.0–17.0)
MCHC: 33.5 g/dL (ref 30.0–36.0)
MCV: 87.3 fl (ref 78.0–100.0)
Platelets: 154 10*3/uL (ref 150.0–400.0)
RBC: 4.15 Mil/uL — ABNORMAL LOW (ref 4.22–5.81)
RDW: 13.3 % (ref 11.5–15.5)
WBC: 5.1 10*3/uL (ref 4.0–10.5)

## 2015-11-28 LAB — HEMOGLOBIN A1C: Hgb A1c MFr Bld: 7.9 % — ABNORMAL HIGH (ref 4.6–6.5)

## 2015-11-28 NOTE — Patient Instructions (Signed)
We will check the blood work today and call you back about the results.   It is okay to stay off the xarelto until you see Dr. Graciela HusbandsKlein. We do recommend to take a full strength aspirin daily 325 mg until that time.   Remember to work on doing some exercise 3-4 times per week to keep the body healthy.   Health Maintenance, Male A healthy lifestyle and preventative care can promote health and wellness.  Maintain regular health, dental, and eye exams.  Eat a healthy diet. Foods like vegetables, fruits, whole grains, low-fat dairy products, and lean protein foods contain the nutrients you need and are low in calories. Decrease your intake of foods high in solid fats, added sugars, and salt. Get information about a proper diet from your health care provider, if necessary.  Regular physical exercise is one of the most important things you can do for your health. Most adults should get at least 150 minutes of moderate-intensity exercise (any activity that increases your heart rate and causes you to sweat) each week. In addition, most adults need muscle-strengthening exercises on 2 or more days a week.   Maintain a healthy weight. The body mass index (BMI) is a screening tool to identify possible weight problems. It provides an estimate of body fat based on height and weight. Your health care provider can find your BMI and can help you achieve or maintain a healthy weight. For males 20 years and older:  A BMI below 18.5 is considered underweight.  A BMI of 18.5 to 24.9 is normal.  A BMI of 25 to 29.9 is considered overweight.  A BMI of 30 and above is considered obese.  Maintain normal blood lipids and cholesterol by exercising and minimizing your intake of saturated fat. Eat a balanced diet with plenty of fruits and vegetables. Blood tests for lipids and cholesterol should begin at age 66 and be repeated every 5 years. If your lipid or cholesterol levels are high, you are over age 66, or you are at  high risk for heart disease, you may need your cholesterol levels checked more frequently.Ongoing high lipid and cholesterol levels should be treated with medicines if diet and exercise are not working.  If you smoke, find out from your health care provider how to quit. If you do not use tobacco, do not start.  Lung cancer screening is recommended for adults aged 55-80 years who are at high risk for developing lung cancer because of a history of smoking. A yearly low-dose CT scan of the lungs is recommended for people who have at least a 30-pack-year history of smoking and are current smokers or have quit within the past 15 years. A pack year of smoking is smoking an average of 1 pack of cigarettes a day for 1 year (for example, a 30-pack-year history of smoking could mean smoking 1 pack a day for 30 years or 2 packs a day for 15 years). Yearly screening should continue until the smoker has stopped smoking for at least 15 years. Yearly screening should be stopped for people who develop a health problem that would prevent them from having lung cancer treatment.  If you choose to drink alcohol, do not have more than 2 drinks per day. One drink is considered to be 12 oz (360 mL) of beer, 5 oz (150 mL) of wine, or 1.5 oz (45 mL) of liquor.  Avoid the use of street drugs. Do not share needles with anyone. Ask for help  if you need support or instructions about stopping the use of drugs.  High blood pressure causes heart disease and increases the risk of stroke. High blood pressure is more likely to develop in:  People who have blood pressure in the end of the normal range (100-139/85-89 mm Hg).  People who are overweight or obese.  People who are African American.  If you are 8318-66 years of age, have your blood pressure checked every 3-5 years. If you are 66 years of age or older, have your blood pressure checked every year. You should have your blood pressure measured twice--once when you are at a  hospital or clinic, and once when you are not at a hospital or clinic. Record the average of the two measurements. To check your blood pressure when you are not at a hospital or clinic, you can use:  An automated blood pressure machine at a pharmacy.  A home blood pressure monitor.  If you are 6245-66 years old, ask your health care provider if you should take aspirin to prevent heart disease.  Diabetes screening involves taking a blood sample to check your fasting blood sugar level. This should be done once every 3 years after age 66 if you are at a normal weight and without risk factors for diabetes. Testing should be considered at a younger age or be carried out more frequently if you are overweight and have at least 1 risk factor for diabetes.  Colorectal cancer can be detected and often prevented. Most routine colorectal cancer screening begins at the age of 66 and continues through age 66. However, your health care provider may recommend screening at an earlier age if you have risk factors for colon cancer. On a yearly basis, your health care provider may provide home test kits to check for hidden blood in the stool. A small camera at the end of a tube may be used to directly examine the colon (sigmoidoscopy or colonoscopy) to detect the earliest forms of colorectal cancer. Talk to your health care provider about this at age 66 when routine screening begins. A direct exam of the colon should be repeated every 5-10 years through age 66, unless early forms of precancerous polyps or small growths are found.  People who are at an increased risk for hepatitis B should be screened for this virus. You are considered at high risk for hepatitis B if:  You were born in a country where hepatitis B occurs often. Talk with your health care provider about which countries are considered high risk.  Your parents were born in a high-risk country and you have not received a shot to protect against hepatitis B  (hepatitis B vaccine).  You have HIV or AIDS.  You use needles to inject street drugs.  You live with, or have sex with, someone who has hepatitis B.  You are a man who has sex with other men (MSM).  You get hemodialysis treatment.  You take certain medicines for conditions like cancer, organ transplantation, and autoimmune conditions.  Hepatitis C blood testing is recommended for all people born from [id number] through 1966 and any individual with known risk factors for hepatitis C.  Healthy men should no longer receive prostate-specific antigen (PSA) blood tests as part of routine cancer screening. Talk to your health care provider about prostate cancer screening.  Testicular cancer screening is not recommended for adolescents or adult males who have no symptoms. Screening includes self-exam, a health care provider exam, and other screening tests.  Consult with your health care provider about any symptoms you have or any concerns you have about testicular cancer.  Practice safe sex. Use condoms and avoid high-risk sexual practices to reduce the spread of sexually transmitted infections (STIs).  You should be screened for STIs, including gonorrhea and chlamydia if:  You are sexually active and are younger than 24 years.  You are older than 24 years, and your health care provider tells you that you are at risk for this type of infection.  Your sexual activity has changed since you were last screened, and you are at an increased risk for chlamydia or gonorrhea. Ask your health care provider if you are at risk.  If you are at risk of being infected with HIV, it is recommended that you take a prescription medicine daily to prevent HIV infection. This is called pre-exposure prophylaxis (PrEP). You are considered at risk if:  You are a man who has sex with other men (MSM).  You are a heterosexual man who is sexually active with multiple partners.  You take drugs by injection.  You are  sexually active with a partner who has HIV.  Talk with your health care provider about whether you are at high risk of being infected with HIV. If you choose to begin PrEP, you should first be tested for HIV. You should then be tested every 3 months for as long as you are taking PrEP.  Use sunscreen. Apply sunscreen liberally and repeatedly throughout the day. You should seek shade when your shadow is shorter than you. Protect yourself by wearing long sleeves, pants, a wide-brimmed hat, and sunglasses year round whenever you are outdoors.  Tell your health care provider of new moles or changes in moles, especially if there is a change in shape or color. Also, tell your health care provider if a mole is larger than the size of a pencil eraser.  A one-time screening for abdominal aortic aneurysm (AAA) and surgical repair of large AAAs by ultrasound is recommended for men aged 8-75 years who are current or former smokers.  Stay current with your vaccines (immunizations).   This information is not intended to replace advice given to you by your health care provider. Make sure you discuss any questions you have with your health care provider.   Document Released: 12/19/2007 Document Revised: 07/13/2014 Document Reviewed: 11/17/2010 Elsevier Interactive Patient Education [employer].

## 2015-11-28 NOTE — Progress Notes (Signed)
Pre visit review using our clinic review tool, if applicable. No additional management support is needed unless otherwise documented below in the visit note. 

## 2015-11-28 NOTE — Progress Notes (Signed)
   Subjective:    Patient ID: Michael BanasWalter Manganiello, male    DOB: 08/13/1950, 65 y.o.   MRN: [id number]  HPI Here for medicare wellness, no new complaints. Please see A/P for status and treatment of chronic medical problems.   Diet: heart healthy Physical activity: sedentary Depression/mood screen: negative Hearing: intact to whispered voice Visual acuity: grossly normal, performs annual eye exam  ADLs: capable Fall risk: none Home safety: good Cognitive evaluation: intact to orientation, naming, recall and repetition EOL planning: adv directives discussed  I have personally reviewed and have noted 1. The patient's medical and social history - reviewed today no changes 2. Their use of alcohol, tobacco or illicit drugs 3. Their current medications and supplements 4. The patient's functional ability including ADL's, fall risks, home safety risks and hearing or visual impairment. 5. Diet and physical activities 6. Evidence for depression or mood disorders 7. Care team reviewed and updated (available in snapshot)  Review of Systems  Constitutional: Negative for fever, activity change, appetite change and fatigue.  HENT: Negative.   Eyes: Negative.   Respiratory: Negative for cough, chest tightness, shortness of breath and wheezing.   Cardiovascular: Negative for chest pain, palpitations and leg swelling.  Gastrointestinal: Negative for diarrhea, constipation and abdominal distention.  Musculoskeletal: Negative.   Skin: Negative.   Neurological: Negative.   Psychiatric/Behavioral: Negative for suicidal ideas, behavioral problems, sleep disturbance, self-injury and decreased concentration. The patient is not nervous/anxious.       Objective:   Physical Exam  Constitutional: He is oriented to person, place, and time. He appears well-developed and well-nourished.  HENT:  Head: Normocephalic and atraumatic.  Eyes: EOM are normal.  Neck: Normal range of motion.  Cardiovascular: Normal  rate.   Pulmonary/Chest: Effort normal and breath sounds normal. No respiratory distress. He has no wheezes.  Abdominal: Soft. Bowel sounds are normal. He exhibits no distension. There is no tenderness. There is no rebound.  Musculoskeletal: He exhibits no edema.  Neurological: He is alert and oriented to person, place, and time.  Skin: Skin is warm and dry.  Psychiatric: He has a normal mood and affect.   Filed Vitals:   11/28/15 1546  BP: 122/70  Pulse: 94  Temp: 98.4 F (36.9 C)  TempSrc: Oral  Resp: 14  Height: 6' (1.829 m)  Weight: 179 lb (81.194 kg)  SpO2: 94%      Assessment & Plan:

## 2015-11-29 LAB — HEPATITIS C ANTIBODY: HCV Ab: REACTIVE — AB

## 2015-11-29 NOTE — Assessment & Plan Note (Signed)
He has stopped his xarelto and wishes to discuss with Dr. Graciela HusbandsKlein after episode of dark urine which may or may not have been blood. He was recommended to start aspirin and discuss with his cardiologist.

## 2015-11-29 NOTE — Assessment & Plan Note (Signed)
Checking HgA1c and lipid panel and CMP and adjust as needed. No low sugars or complications noted. Taking ACE-I but no statin.

## 2015-11-29 NOTE — Assessment & Plan Note (Signed)
Colonoscopy due in 2019, tetanus up to date and immunizations up to date. Checking labs today. Counseled on the need for regular exercise and home safety. Given 10 year screening recommendations.

## 2015-11-29 NOTE — Assessment & Plan Note (Signed)
Taking lisinopril/hctz and BP at goal. Checking CMP and adjust as needed.  

## 2015-12-03 LAB — HEPATITIS C RNA QUANTITATIVE
HCV QUANT: 39339 [IU]/mL — AB (ref <15)
HCV Quantitative Log: 4.59 {Log} — ABNORMAL HIGH (ref <1.18)

## 2015-12-11 ENCOUNTER — Encounter: Payer: Self-pay | Admitting: Cardiology

## 2015-12-11 LAB — CUP PACEART REMOTE DEVICE CHECK
Battery Impedance: 135 Ohm
Brady Statistic AP VS Percent: 13 %
Brady Statistic AS VS Percent: 86 %
Date Time Interrogation Session: 20170428023709
Implantable Lead Implant Date: 20080711
Implantable Lead Location: 753859
Implantable Lead Location: 753860
Lead Channel Impedance Value: 521 Ohm
Lead Channel Pacing Threshold Pulse Width: 0.4 ms
Lead Channel Sensing Intrinsic Amplitude: 0.7 mV
Lead Channel Sensing Intrinsic Amplitude: 5.6 mV
Lead Channel Setting Pacing Amplitude: 2 V
Lead Channel Setting Pacing Pulse Width: 0.4 ms
MDC IDC LEAD IMPLANT DT: 20150918
MDC IDC MSMT BATTERY REMAINING LONGEVITY: 148 mo
MDC IDC MSMT BATTERY VOLTAGE: 2.79 V
MDC IDC MSMT LEADCHNL RA IMPEDANCE VALUE: 428 Ohm
MDC IDC MSMT LEADCHNL RA PACING THRESHOLD AMPLITUDE: 0.5 V
MDC IDC MSMT LEADCHNL RA PACING THRESHOLD PULSEWIDTH: 0.4 ms
MDC IDC MSMT LEADCHNL RV PACING THRESHOLD AMPLITUDE: 0.75 V
MDC IDC SET LEADCHNL RV PACING AMPLITUDE: 2.5 V
MDC IDC SET LEADCHNL RV SENSING SENSITIVITY: 2.8 mV
MDC IDC STAT BRADY AP VP PERCENT: 0 %
MDC IDC STAT BRADY AS VP PERCENT: 1 %

## 2015-12-24 ENCOUNTER — Encounter: Payer: Self-pay | Admitting: Internal Medicine

## 2015-12-24 ENCOUNTER — Ambulatory Visit (INDEPENDENT_AMBULATORY_CARE_PROVIDER_SITE_OTHER): Payer: Medicare Other | Admitting: Internal Medicine

## 2015-12-24 VITALS — BP 118/76 | HR 77 | Temp 97.6°F | Resp 12 | Ht 72.0 in | Wt 179.0 lb

## 2015-12-24 DIAGNOSIS — B182 Chronic viral hepatitis C: Secondary | ICD-10-CM | POA: Diagnosis not present

## 2015-12-24 DIAGNOSIS — Z8619 Personal history of other infectious and parasitic diseases: Secondary | ICD-10-CM | POA: Insufficient documentation

## 2015-12-24 NOTE — Patient Instructions (Signed)
We will get you in with the infectious disease doctors to treat the hepatitis C infection.   Hepatitis C Hepatitis C is a viral infection of the liver. It can lead to scarring of the liver (cirrhosis), liver failure, or liver cancer. Hepatitis C may go undetected for months or years because people with the infection may not have symptoms, or they may have only mild symptoms. CAUSES  Hepatitis C is caused by the hepatitis C virus (HCV). The virus can be passed from one person to another through:  Blood.  Contaminated needles, such as those used for tattooing, body piercing, acupuncture, or injecting drugs.  Having unprotected sex with an infected person.  Childbirth.  Blood transfusions or organ transplants done in the [place] before 1992. SIGNS AND SYMPTOMS  Symptoms of hepatitis C may include:  Fatigue.  Loss of appetite.  Nausea.  Vomiting.  Abdominal pain.  Dark yellow urine.  Yellowish skin and eyes (jaundice).  Itching of the skin.  Clay-colored bowel movements.  Joint pain. Symptoms are not always present.  DIAGNOSIS  Hepatitis C is diagnosed with blood tests. Other types of tests may also be done to check how your liver is functioning. TREATMENT  Your health care provider may perform noninvasive tests or a liver biopsy to help determine the best course of treatment. Treatment for hepatitis C may include one or more medicines. Your health care provider may check you for a recurring infection or other liver conditions every 6-12 months after treatment. HOME CARE INSTRUCTIONS   Rest as needed.  Take all medicines as directed by your health care provider.  Do not take any medicine unless approved by your health care provider. This includes over-the-counter medicine and birth control pills.  Do not drink alcohol.  Do not have sex until approved by your health care provider.  Do not share toothbrushes, nail clippers, razors, or needles. PREVENTION There  is no vaccine for hepatitis C. The only way to prevent the disease is to reduce the risk of exposure to the virus. This may be done by:  Practicing safe sex and using condoms.  Avoiding illegal drugs. SEEK MEDICAL CARE IF:  You have a fever.  You develop abdominal pain.  You develop dark urine.  You have clay-colored bowel movements.  You develop joint pains. SEEK IMMEDIATE MEDICAL CARE IF:  You have increasing fatigue or weakness.  You lose your appetite.  You feel nauseous or vomit.  You develop jaundice or your jaundice gets worse.  You bruise or bleed easily. MAKE SURE YOU:   Understand these instructions.  Will watch your condition.  Will get help right away if you are not doing well or get worse.   This information is not intended to replace advice given to you by your health care provider. Make sure you discuss any questions you have with your health care provider.   Document Released: 06/19/2000 Document Revised: 07/13/2014 Document Reviewed: 10/04/2013 Elsevier Interactive Patient Education [employer] Elsevier Inc.

## 2015-12-24 NOTE — Progress Notes (Signed)
Pre visit review using our clinic review tool, if applicable. No additional management support is needed unless otherwise documented below in the visit note. 

## 2015-12-24 NOTE — Progress Notes (Signed)
   Subjective:    Patient ID: Michael Snyder, male    DOB: 10/10/1950, 65 y.o.   MRN: [id number]  HPI The patient is a 65 YO man coming in for follow up of his positive screening test for hepatitis C. He is concerned about where he might have gotten it and what to do next. Denies jaundice, joint pains, dark urine. No fevers or chills. Did have remote history of drug use but not recent (>5 years ago).   Review of Systems  Constitutional: Negative for fever, activity change, appetite change and fatigue.  Eyes: Negative.   Respiratory: Negative for cough, chest tightness, shortness of breath and wheezing.   Cardiovascular: Negative for chest pain, palpitations and leg swelling.  Gastrointestinal: Negative for diarrhea, constipation and abdominal distention.  Musculoskeletal: Negative.   Skin: Negative.   Neurological: Negative.       Objective:   Physical Exam  Constitutional: He is oriented to person, place, and time. He appears well-developed and well-nourished.  HENT:  Head: Normocephalic and atraumatic.  Eyes: Conjunctivae and EOM are normal.  Neck: Normal range of motion.  Cardiovascular: Normal rate.   Pulmonary/Chest: Effort normal and breath sounds normal. No respiratory distress. He has no wheezes.  Abdominal: Soft. Bowel sounds are normal. He exhibits no distension. There is no tenderness. There is no rebound.  Musculoskeletal: He exhibits no edema.  Neurological: He is alert and oriented to person, place, and time.  Skin: Skin is warm and dry.  Psychiatric: He has a normal mood and affect.   Filed Vitals:   12/24/15 0929  BP: 118/76  Pulse: 77  Temp: 97.6 F (36.4 C)  TempSrc: Oral  Resp: 12  Height: 6' (1.829 m)  Weight: 179 lb (81.194 kg)  SpO2: 97%     Assessment & Plan:

## 2015-12-24 NOTE — Assessment & Plan Note (Signed)
Does have remote history of drug usage which is likely how he contracted this. Referral to ID for treatment. Talked to him about spread and treatment and evaluation for this condition.

## 2016-01-24 DIAGNOSIS — H26492 Other secondary cataract, left eye: Secondary | ICD-10-CM | POA: Diagnosis not present

## 2016-01-24 DIAGNOSIS — H401131 Primary open-angle glaucoma, bilateral, mild stage: Secondary | ICD-10-CM | POA: Diagnosis not present

## 2016-01-29 DIAGNOSIS — B182 Chronic viral hepatitis C: Secondary | ICD-10-CM | POA: Diagnosis not present

## 2016-01-30 ENCOUNTER — Telehealth: Payer: Self-pay | Admitting: Cardiology

## 2016-01-30 ENCOUNTER — Ambulatory Visit (INDEPENDENT_AMBULATORY_CARE_PROVIDER_SITE_OTHER): Payer: Medicare Other | Admitting: *Deleted

## 2016-01-30 DIAGNOSIS — I495 Sick sinus syndrome: Secondary | ICD-10-CM | POA: Diagnosis not present

## 2016-01-30 NOTE — Telephone Encounter (Signed)
LMOVM reminding pt to send remote transmission.   

## 2016-01-31 ENCOUNTER — Encounter: Payer: Self-pay | Admitting: Cardiology

## 2016-01-31 LAB — CUP PACEART REMOTE DEVICE CHECK
Brady Statistic AP VP Percent: 0.4 %
Date Time Interrogation Session: 20170728122832
Implantable Lead Implant Date: 20080711
Implantable Lead Implant Date: 20150918
Lead Channel Impedance Value: 499 Ohm
Lead Channel Pacing Threshold Amplitude: 0.625 V
Lead Channel Sensing Intrinsic Amplitude: 15 mV
Lead Channel Setting Pacing Amplitude: 2 V
Lead Channel Setting Pacing Pulse Width: 0.4 ms
Lead Channel Setting Sensing Sensitivity: 2.8 mV
MDC IDC LEAD LOCATION: 753859
MDC IDC LEAD LOCATION: 753860
MDC IDC MSMT LEADCHNL RA IMPEDANCE VALUE: 459 Ohm
MDC IDC MSMT LEADCHNL RA PACING THRESHOLD AMPLITUDE: 0.5 V
MDC IDC MSMT LEADCHNL RA PACING THRESHOLD PULSEWIDTH: 0.4 ms
MDC IDC MSMT LEADCHNL RA SENSING INTR AMPL: 2.8 mV
MDC IDC MSMT LEADCHNL RV PACING THRESHOLD PULSEWIDTH: 0.4 ms
MDC IDC SET LEADCHNL RV PACING AMPLITUDE: 2.5 V
MDC IDC STAT BRADY AP VS PERCENT: 12.7 %
MDC IDC STAT BRADY AS VP PERCENT: 0.7 %
MDC IDC STAT BRADY AS VS PERCENT: 86.2 %

## 2016-01-31 NOTE — Progress Notes (Signed)
Remote pacemaker transmission.   

## 2016-02-01 ENCOUNTER — Other Ambulatory Visit: Payer: Self-pay | Admitting: Internal Medicine

## 2016-02-03 ENCOUNTER — Other Ambulatory Visit (HOSPITAL_COMMUNITY): Payer: Self-pay | Admitting: Nurse Practitioner

## 2016-02-03 DIAGNOSIS — B182 Chronic viral hepatitis C: Secondary | ICD-10-CM

## 2016-02-27 ENCOUNTER — Ambulatory Visit (HOSPITAL_COMMUNITY)
Admission: RE | Admit: 2016-02-27 | Discharge: 2016-02-27 | Disposition: A | Discharge status: Home / Self Care | Payer: Medicare Other | Source: Ambulatory Visit | Attending: Nurse Practitioner | Admitting: Nurse Practitioner

## 2016-02-27 DIAGNOSIS — B182 Chronic viral hepatitis C: Secondary | ICD-10-CM

## 2016-02-28 DIAGNOSIS — H26492 Other secondary cataract, left eye: Secondary | ICD-10-CM | POA: Diagnosis not present

## 2016-03-17 ENCOUNTER — Ambulatory Visit (HOSPITAL_COMMUNITY)
Admission: RE | Admit: 2016-03-17 | Discharge: 2016-03-17 | Disposition: A | Discharge status: Home / Self Care | Payer: Medicare Other | Source: Ambulatory Visit | Attending: Nurse Practitioner | Admitting: Nurse Practitioner

## 2016-03-17 DIAGNOSIS — K8020 Calculus of gallbladder without cholecystitis without obstruction: Secondary | ICD-10-CM | POA: Diagnosis not present

## 2016-03-17 DIAGNOSIS — N281 Cyst of kidney, acquired: Secondary | ICD-10-CM | POA: Diagnosis not present

## 2016-03-17 DIAGNOSIS — B182 Chronic viral hepatitis C: Secondary | ICD-10-CM | POA: Insufficient documentation

## 2016-04-08 ENCOUNTER — Ambulatory Visit (HOSPITAL_COMMUNITY): Payer: Medicare Other

## 2016-05-11 ENCOUNTER — Other Ambulatory Visit: Payer: Self-pay | Admitting: Internal Medicine

## 2016-05-12 ENCOUNTER — Telehealth: Payer: Self-pay | Admitting: Internal Medicine

## 2016-05-12 NOTE — Telephone Encounter (Signed)
Walk In pt Form-[place] [facility],

## 2016-05-19 NOTE — Telephone Encounter (Signed)
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 Paperwork completed by Dr. Graciela HusbandsKlein and faxed back to [facility] [employer] at [phone] on 05/14/16. Confirmation received.

## 2016-06-01 ENCOUNTER — Ambulatory Visit: Payer: Medicare Other | Admitting: Internal Medicine

## 2016-06-02 ENCOUNTER — Encounter: Payer: Self-pay | Admitting: Internal Medicine

## 2016-06-02 ENCOUNTER — Ambulatory Visit (INDEPENDENT_AMBULATORY_CARE_PROVIDER_SITE_OTHER): Payer: Medicare Other | Admitting: Internal Medicine

## 2016-06-02 ENCOUNTER — Other Ambulatory Visit (INDEPENDENT_AMBULATORY_CARE_PROVIDER_SITE_OTHER): Payer: Medicare Other

## 2016-06-02 VITALS — BP 130/90 | HR 80 | Temp 98.1°F | Wt 186.0 lb

## 2016-06-02 DIAGNOSIS — E119 Type 2 diabetes mellitus without complications: Secondary | ICD-10-CM

## 2016-06-02 DIAGNOSIS — I10 Essential (primary) hypertension: Secondary | ICD-10-CM | POA: Diagnosis not present

## 2016-06-02 DIAGNOSIS — Z23 Encounter for immunization: Secondary | ICD-10-CM

## 2016-06-02 DIAGNOSIS — B182 Chronic viral hepatitis C: Secondary | ICD-10-CM | POA: Diagnosis not present

## 2016-06-02 NOTE — Progress Notes (Signed)
   Subjective:    Patient ID: Michael Snyder, male    DOB: 07/02/1951, 65 y.o.   MRN: [id number]  HPI The patient is a 65 YO man coming in for follow up of his blood pressure. He is just finishing up his hep c treatment and had a good response. No side effects to the medications. He denies any headaches or chest pains. He is taking his medications as directed. He is still adjusting to having the hep c and his close relative recently passed of complications of hep c which scared him and is making him glad he is doing the treatment.   Review of Systems  Constitutional: Negative.   Respiratory: Negative for cough, chest tightness and shortness of breath.   Cardiovascular: Negative for chest pain, palpitations and leg swelling.  Gastrointestinal: Negative for abdominal distention, abdominal pain, constipation, diarrhea, nausea and vomiting.  Musculoskeletal: Negative.   Skin: Negative.   Neurological: Negative.       Objective:   Physical Exam  Constitutional: He is oriented to person, place, and time. He appears well-developed and well-nourished.  HENT:  Head: Normocephalic and atraumatic.  Eyes: EOM are normal.  Neck: Normal range of motion.  Cardiovascular: Normal rate and regular rhythm.   Pulmonary/Chest: Effort normal and breath sounds normal. No respiratory distress. He has no wheezes. He has no rales.  Abdominal: Soft. Bowel sounds are normal. He exhibits no distension. There is no tenderness. There is no rebound.  Musculoskeletal: He exhibits no edema.  Neurological: He is alert and oriented to person, place, and time. Coordination normal.  Skin: Skin is warm and dry.   Vitals:   06/02/16 1628  BP: 130/90  Pulse: 80  Temp: 98.1 F (36.7 C)  TempSrc: Oral  SpO2: 100%  Weight: 186 lb (84.4 kg)      Assessment & Plan:  Flu shot given at visit

## 2016-06-02 NOTE — Patient Instructions (Signed)
We have given you the flu shot today.   We are checking the labs today and will let you know about the pacemaker when we call back with the labs.

## 2016-06-03 LAB — HEMOGLOBIN A1C: HEMOGLOBIN A1C: 7.5 % — AB (ref 4.6–6.5)

## 2016-06-03 NOTE — Assessment & Plan Note (Signed)
Taking lisinopril/hctz and BP at goal. Last BMP at goal and he is getting monitoring at the liver clinic while undergoing his hep c treatment.

## 2016-06-03 NOTE — Assessment & Plan Note (Signed)
Getting treatment and finishing meds and then will get some serial monitoring of his levels.

## 2016-06-16 DIAGNOSIS — H401231 Low-tension glaucoma, bilateral, mild stage: Secondary | ICD-10-CM | POA: Diagnosis not present

## 2016-06-16 DIAGNOSIS — H5203 Hypermetropia, bilateral: Secondary | ICD-10-CM | POA: Diagnosis not present

## 2016-06-16 LAB — HM DIABETES EYE EXAM

## 2016-06-23 ENCOUNTER — Encounter: Payer: Self-pay | Admitting: Internal Medicine

## 2016-08-14 ENCOUNTER — Other Ambulatory Visit: Payer: Self-pay | Admitting: Internal Medicine

## 2016-12-08 ENCOUNTER — Other Ambulatory Visit: Payer: Self-pay | Admitting: Internal Medicine

## 2017-02-02 DIAGNOSIS — H401222 Low-tension glaucoma, left eye, moderate stage: Secondary | ICD-10-CM | POA: Diagnosis not present

## 2017-02-02 DIAGNOSIS — H401211 Low-tension glaucoma, right eye, mild stage: Secondary | ICD-10-CM | POA: Diagnosis not present

## 2017-02-02 DIAGNOSIS — H47292 Other optic atrophy, left eye: Secondary | ICD-10-CM | POA: Diagnosis not present

## 2017-03-26 NOTE — Progress Notes (Deleted)
Pre visit review using our clinic review tool, if applicable. No additional management support is needed unless otherwise documented below in the visit note. 

## 2017-03-26 NOTE — Progress Notes (Deleted)
Subjective:   Michael Snyder is a 66 y.o. male who presents for Medicare Annual/Subsequent preventive examination.  Review of Systems:  No ROS.  Medicare Wellness Visit. Additional risk factors are reflected in the social history.    Sleep patterns: {SX; SLEEP PATTERNS:18802::"feels rested on waking","does not get up to void","gets up *** times nightly to void","sleeps *** hours nightly"}.    Home Safety/Smoke Alarms: Feels safe in home. Smoke alarms in place.  Living environment; residence and Firearm Safety: {Rehab home environment / accessibility:30080::"no firearms","firearms stored safely"}. Seat Belt Safety/Bike Helmet: Wears seat belt.        Objective:    Vitals: There were no vitals taken for this visit.  There is no height or weight on file to calculate BMI.  Tobacco History  Smoking Status  . Never Smoker  Smokeless Tobacco  . Never Used     Counseling given: Not Answered   Past Medical History:  Diagnosis Date  . Alcohol abuse   . Arthritis   . Diabetes mellitus, type 2 ([place])   . Head injury, closed, without LOC 1980s  . History of alcohol abuse   . Hypertension   . 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 Pacemaker    [place]  . Seizure disorder ([place])   . Seizures ([place])    last time in 1990's   Past Surgical History:  Procedure Laterality Date  . LACERATION REPAIR  1980   scalp  . PACEMAKER LEAD REMOVAL N/A 03/23/2014   Procedure: PACEMAKER LEAD REMOVAL;  Surgeon: Evans Lance, MD;  Location: [facility];  Service: Cardiovascular;  Laterality: N/A;  . PTDVP     7/08 medtronic DDD   Family History  Problem Relation Age of Onset  . Hypertension Mother   . COPD Mother   . Hypertension Brother   . Cancer Brother        leukemia  . Hypertension Brother    History  Sexual Activity  . Sexual activity: Yes  . Partners: Female    Outpatient Encounter Prescriptions as of 03/29/2017  Medication Sig  . ALPHAGAN P 0.1 % SOLN  Place 2 drops into the left eye daily.  . Blood Glucose Monitoring Suppl (ONETOUCH VERIO IQ SYSTEM) w/Device KIT Use to check blood sugars twice a day Dx e11.9  . Calcium Carbonate-Vitamin D (CALTRATE 600+D) 600-400 MG-UNIT per tablet Take 1 tablet by mouth 2 (two) times daily.  [name] gabapentin (NEURONTIN) 300 MG capsule Take 300 mg by mouth every 6 (six) hours as needed (pain).   [name] glipiZIDE (GLUCOTROL) 5 MG tablet TAKE 1 TABLET BY MOUTH  TWICE A DAY BEFORE MEALS  . lisinopril-hydrochlorothiazide (PRINZIDE,ZESTORETIC) 10-12.5 MG tablet TAKE 1 TABLET BY MOUTH  DAILY  . Multiple Vitamin (MULTIVITAMIN WITH MINERALS) TABS tablet Take 1 tablet by mouth daily.  . Omega-3 Fatty Acids (FISH OIL) 300 MG CAPS Take 1 capsule (300 mg total) by mouth daily.  Glory Rosebush DELICA LANCETS 96V MISC Check blood sugar two times daily  . ONETOUCH VERIO test strip Use as directed to test  blood sugar twice a day  . potassium chloride SA (K-DUR,KLOR-CON) 20 MEQ tablet TAKE 1 TABLET BY MOUTH TWO  TIMES DAILY  . XARELTO 20 MG TABS tablet Take 1 tablet by mouth  daily with supper (Patient not taking: Reported on 06/02/2016)   No facility-administered encounter medications on file as of 03/29/2017.     Activities of Daily Living No flowsheet data  found.  Patient Care Team: Hoyt Koch, MD as PCP - General (Internal Medicine)   Assessment:    Physical assessment deferred to PCP.  Exercise Activities and Dietary recommendations   Diet (meal preparation, eat out, water intake, caffeinated beverages, dairy products, fruits and vegetables): {Desc; diets:16563}     Goals    None     Fall Risk Fall Risk  08/21/2015  Falls in the past year? No   Depression Screen PHQ 2/9 Scores 08/21/2015  PHQ - 2 Score 0    Cognitive Function        Immunization History  Administered Date(s) Administered  . Influenza Whole 03/30/2008, 04/16/2009, 06/05/2010  . Influenza, High Dose Seasonal PF 06/02/2016  .  Influenza,inj,Quad PF,6+ Mos 05/16/2013, 03/08/2014, 08/21/2015  . Pneumococcal Conjugate-13 11/01/2014  . Pneumococcal Polysaccharide-23 09/13/2012  . Td 03/30/2008  . Zoster 07/20/2014   Screening Tests Health Maintenance  Topic Date Due  . FOOT EXAM  11/27/2016  . HEMOGLOBIN A1C  11/30/2016  . INFLUENZA VACCINE  02/03/2017  . OPHTHALMOLOGY EXAM  06/16/2017  . PNA vac Low Risk Adult (2 of 2 - PPSV23) 09/13/2017  . COLONOSCOPY  10/14/2017  . TETANUS/TDAP  03/30/2018  . Hepatitis C Screening  Completed      Plan:     I have personally reviewed and noted the following in the patient's chart:   . Medical and social history . Use of alcohol, tobacco or illicit drugs  . Current medications and supplements . Functional ability and status . Nutritional status . Physical activity . Advanced directives . List of other physicians . Vitals . Screenings to include cognitive, depression, and falls . Referrals and appointments  In addition, I have reviewed and discussed with patient certain preventive protocols, quality metrics, and best practice recommendations. A written personalized care plan for preventive services as well as general preventive health recommendations were provided to patient.     Michiel Cowboy, RN  03/26/2017

## 2017-03-29 ENCOUNTER — Ambulatory Visit: Payer: Medicare Other

## 2017-03-31 ENCOUNTER — Other Ambulatory Visit: Payer: Self-pay | Admitting: Internal Medicine

## 2017-04-05 ENCOUNTER — Encounter: Payer: Self-pay | Admitting: Internal Medicine

## 2017-04-05 ENCOUNTER — Other Ambulatory Visit (INDEPENDENT_AMBULATORY_CARE_PROVIDER_SITE_OTHER): Payer: Medicare Other

## 2017-04-05 ENCOUNTER — Ambulatory Visit (INDEPENDENT_AMBULATORY_CARE_PROVIDER_SITE_OTHER): Payer: Medicare Other | Admitting: Internal Medicine

## 2017-04-05 VITALS — BP 138/88 | HR 78 | Temp 98.5°F | Ht 72.0 in | Wt 175.0 lb

## 2017-04-05 DIAGNOSIS — I10 Essential (primary) hypertension: Secondary | ICD-10-CM

## 2017-04-05 DIAGNOSIS — J029 Acute pharyngitis, unspecified: Secondary | ICD-10-CM | POA: Diagnosis not present

## 2017-04-05 DIAGNOSIS — E119 Type 2 diabetes mellitus without complications: Secondary | ICD-10-CM

## 2017-04-05 DIAGNOSIS — Z23 Encounter for immunization: Secondary | ICD-10-CM | POA: Diagnosis not present

## 2017-04-05 LAB — COMPREHENSIVE METABOLIC PANEL
ALBUMIN: 4.4 g/dL (ref 3.5–5.2)
ALK PHOS: 72 U/L (ref 39–117)
ALT: 21 U/L (ref 0–53)
AST: 30 U/L (ref 0–37)
BUN: 17 mg/dL (ref 6–23)
CALCIUM: 9.8 mg/dL (ref 8.4–10.5)
CO2: 26 mEq/L (ref 19–32)
Chloride: 106 mEq/L (ref 96–112)
Creatinine, Ser: 1.6 mg/dL — ABNORMAL HIGH (ref 0.40–1.50)
GFR: 55.63 mL/min — ABNORMAL LOW (ref >60.00)
Glucose, Bld: 133 mg/dL — ABNORMAL HIGH (ref 70–99)
POTASSIUM: 4.4 meq/L (ref 3.5–5.1)
Sodium: 139 mEq/L (ref 135–145)
TOTAL PROTEIN: 7 g/dL (ref 6.0–8.3)
Total Bilirubin: 0.9 mg/dL (ref 0.2–1.2)

## 2017-04-05 LAB — LIPID PANEL
CHOLESTEROL: 157 mg/dL (ref 0–200)
HDL: 48.7 mg/dL (ref >39.00)
LDL Cholesterol: 92 mg/dL (ref 0–99)
NonHDL: 108.08
TRIGLYCERIDES: 79 mg/dL (ref 0.0–149.0)
Total CHOL/HDL Ratio: 3
VLDL: 15.8 mg/dL (ref 0.0–40.0)

## 2017-04-05 LAB — HEMOGLOBIN A1C: HEMOGLOBIN A1C: 8.6 % — AB (ref 4.6–6.5)

## 2017-04-05 MED ORDER — LIDOCAINE VISCOUS 2 % MT SOLN: 20.0000 mL | OROMUCOSAL | 0 refills | Status: DC | PRN

## 2017-04-05 NOTE — Patient Instructions (Signed)
We have sent in the liquid medicine to use for pain if needed you can swish and swallow the medicine.

## 2017-04-05 NOTE — Progress Notes (Signed)
   Subjective:    Patient ID: Michael Snyder, male    DOB: 1951/03/27, 66 y.o.   MRN: [id number]  HPI The patient is a 66 YO man coming in for several things including new sore throat (going on for about 2-3 weeks, has tried some vicks on his skin and swallowing salt water which both helped temporarily, has some GERD which is controlled okay, hasa recent broken tooth on that side which is not infected that he knows of), and his diabetes (has not had checked in some time, needs foot exam, denies new numbness or pain,  Taking glipizide, ACE-I), and his hypertension (taking lisinopril/hctz and BP at goal, denies headaches or chest pains or abdominal pain).   Review of Systems  Constitutional: Negative.   HENT: Positive for sore throat. Negative for congestion, dental problem, mouth sores, nosebleeds, postnasal drip, rhinorrhea and trouble swallowing.   Eyes: Negative.   Respiratory: Negative for cough, chest tightness and shortness of breath.   Cardiovascular: Negative for chest pain, palpitations and leg swelling.  Gastrointestinal: Negative for abdominal distention, abdominal pain, constipation, diarrhea, nausea and vomiting.  Musculoskeletal: Negative.   Skin: Negative.   Neurological: Negative.   Psychiatric/Behavioral: Negative.       Objective:   Physical Exam  Constitutional: He is oriented to person, place, and time. He appears well-developed and well-nourished.  HENT:  Head: Normocephalic and atraumatic.  Oropharynx without erythema or drainage  Eyes: EOM are normal.  Neck: Normal range of motion.  Cardiovascular: Normal rate and regular rhythm.   Pulmonary/Chest: Effort normal and breath sounds normal. No respiratory distress. He has no wheezes. He has no rales.  Abdominal: Soft. Bowel sounds are normal. He exhibits no distension. There is no tenderness. There is no rebound.  Musculoskeletal: He exhibits no edema.  Neurological: He is alert and oriented to person, place, and time.  Coordination normal.  Skin: Skin is warm and dry.  Psychiatric: He has a normal mood and affect.   Vitals:   04/05/17 1527  BP: 138/88  Pulse: 78  Temp: 98.5 F (36.9 C)  TempSrc: Oral  SpO2: 100%  Weight: 175 lb (79.4 kg)  Height: 6' (1.829 m)      Assessment & Plan:  Flu shot given at visit

## 2017-04-07 ENCOUNTER — Encounter: Payer: Self-pay | Admitting: Internal Medicine

## 2017-04-07 DIAGNOSIS — J029 Acute pharyngitis, unspecified: Secondary | ICD-10-CM | POA: Insufficient documentation

## 2017-04-07 NOTE — Assessment & Plan Note (Signed)
Taking glipizide without low sugars. Foot exam done. Reminded about eye exam yearly. Checking HgA1c and lipid panel. Adjust as needed. On ACE-I.

## 2017-04-07 NOTE — Assessment & Plan Note (Signed)
BP at goal on lisinopril/hctz. Checking CMP and adjust as needed.  

## 2017-04-07 NOTE — Assessment & Plan Note (Addendum)
Likely from dental concerns and given rx for lidocaine viscous. He has broken tooth on that side and advised to follow up with oral surgeon or his dentist for evaluation. No signs of sinusitis/strep. No indication for antibiotics or steroids.

## 2017-04-12 ENCOUNTER — Other Ambulatory Visit: Payer: Self-pay | Admitting: Internal Medicine

## 2017-04-12 MED ORDER — PIOGLITAZONE HCL 15 MG PO TABS: 15.0000 mg | ORAL_TABLET | Freq: Every day | ORAL | 6 refills | Status: DC

## 2017-04-14 ENCOUNTER — Other Ambulatory Visit: Payer: Self-pay | Admitting: Internal Medicine

## 2017-05-08 ENCOUNTER — Other Ambulatory Visit: Payer: Self-pay | Admitting: Internal Medicine

## 2017-05-10 ENCOUNTER — Telehealth: Payer: Self-pay | Admitting: Internal Medicine

## 2017-05-10 NOTE — Telephone Encounter (Signed)
Walk in Pt Form-[employer] Cardiac Condition Check List Dropped off by patient placed in Dr.Klein Doc Box

## 2017-05-11 ENCOUNTER — Telehealth: Payer: Self-pay | Admitting: Internal Medicine

## 2017-05-11 NOTE — Telephone Encounter (Signed)
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 Please call

## 2017-05-11 NOTE — Telephone Encounter (Signed)
Called patient about his message. Informed patient that his paper work was received and is in Dr. Odessa FlemingKlein's mailbox. Informed patient that Dr. Graciela HusbandsKlein will be back in the office on Thursday, and we will look over paper work at that time. Patient verbalized understanding.

## 2017-05-14 ENCOUNTER — Encounter: Payer: Self-pay | Admitting: Internal Medicine

## 2017-05-14 ENCOUNTER — Ambulatory Visit: Payer: Medicare Other | Admitting: Internal Medicine

## 2017-05-14 VITALS — BP 138/90 | HR 85 | Ht 72.0 in | Wt 181.0 lb

## 2017-05-14 DIAGNOSIS — I10 Essential (primary) hypertension: Secondary | ICD-10-CM

## 2017-05-14 DIAGNOSIS — I495 Sick sinus syndrome: Secondary | ICD-10-CM | POA: Diagnosis not present

## 2017-05-14 DIAGNOSIS — Z950 Presence of cardiac pacemaker: Secondary | ICD-10-CM | POA: Diagnosis not present

## 2017-05-14 NOTE — Telephone Encounter (Signed)
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 Please re-fax the form .

## 2017-05-14 NOTE — Progress Notes (Signed)
Patient Care Team: Hoyt Koch, MD as PCP - General (Internal Medicine)   HPI  Michael Snyder is a 66 y.o. male seen in followup for pacemaker implanted 2008 because of symptomatic sinus node dysfunction and profound nocturnal bradycardia not felt to be related to sleep apnea. He has had no further symptoms of lightheadedness following device implantation.     Evaluation of his left ventricular function by echo in 2008 demonstrated an EF of 55-65%.   Nonsustained ventricular tachycardia was noted on his device.    In 9/15 he underwent extraction of his previously implanted RV lead which become dislodged with  insertion of a new lead  And he has a history of a retinal event that was thought to be possibly thromboembolic. He had atrial high rate episodes detected on his device and the concern was that this represented atrial fibrillation. He was started on Rivaroxaban which he tolerated but has intercurrently stopped; ASA is his current choice   Date Cr K  10/18 1.6 4.4          Past Medical History:  Diagnosis Date  . Alcohol abuse   . Arthritis   . Diabetes mellitus, type 2 ([place])   . Head injury, closed, without LOC 1980s  . History of alcohol abuse   . Hypertension   . 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 Pacemaker    [place]  . Seizure disorder ([place])   . Seizures ([place])    last time in 1990's    Past Surgical History:  Procedure Laterality Date  . LACERATION REPAIR  1980   scalp  . PTDVP     7/08 medtronic DDD    Current Outpatient Medications  Medication Sig Dispense Refill  . ALPHAGAN P 0.1 % SOLN Place 2 drops into the left eye daily.    . Blood Glucose Monitoring Suppl (ONETOUCH VERIO IQ SYSTEM) w/Device KIT USE TO CHECK BLOOD SUGARS  TWICE A DAY 1 kit 0  . Calcium Carbonate-Vitamin D (CALTRATE 600+D) 600-400 MG-UNIT per tablet Take 1 tablet by mouth 2 (two) times daily. 180 tablet 3  . gabapentin (NEURONTIN) 300 MG  capsule Take 300 mg by mouth every 6 (six) hours as needed (pain).     [name] glipiZIDE (GLUCOTROL) 5 MG tablet TAKE 1 TABLET BY MOUTH  TWICE A DAY BEFORE MEALS 180 tablet 3  . glucose blood (ONETOUCH VERIO) test strip Annual appt due in Nov must see provider for future refills 200 each 0  . lidocaine (XYLOCAINE) 2 % solution Use as directed 20 mLs in the mouth or throat as needed for mouth pain. 100 mL 0  . lisinopril-hydrochlorothiazide (PRINZIDE,ZESTORETIC) 10-12.5 MG tablet TAKE 1 TABLET BY MOUTH  DAILY 90 tablet 3  . Multiple Vitamin (MULTIVITAMIN WITH MINERALS) TABS tablet Take 1 tablet by mouth daily.    . Omega-3 Fatty Acids (FISH OIL) 300 MG CAPS Take 1 capsule (300 mg total) by mouth daily. 90 capsule 3  . ONETOUCH DELICA LANCETS 50P MISC CHECK BLOOD SUGAR TWO TIMES DAILY 200 each 11  . pioglitazone (ACTOS) 15 MG tablet Take 1 tablet (15 mg total) by mouth daily. 30 tablet 6  . potassium chloride SA (K-DUR,KLOR-CON) 20 MEQ tablet TAKE 1 TABLET BY MOUTH TWO  TIMES DAILY 180 tablet 3   No current facility-administered medications for this visit.     No Known Allergies  Review of Systems negative except from HPI  and PMH  Physical Exam BP 138/90   Pulse 85   Ht 6' (1.829 m)   Wt 181 lb (82.1 kg)   SpO2 98%   BMI 24.55 kg/m  Well developed and nourished in no acute distress HENT normal Neck supple with JVP-flat Carotids brisk and full without bruits Clear Regular rate and rhythm, no murmurs or gallops Abd-soft with active BS without hepatomegaly No Clubbing cyanosis edema Skin-warm and dry A & Oriented  Grossly normal sensory and motor function  ECG demonstrates sinus 80 33/08/35   Assessment and  Plan  Sinus bradycardia  1 AV block stable  TIA  No intercurrent events or detected atrial fibrillation   Pacemaker-Medtronic The patient's device was interrogated.  The information was reviewed. No changes were made in the programming.   Hypertension  Ventricular  tachycardia nonsustained  BP mildly elevated  Will have him followup at home  No afib detected-- he has chosen to use asa and not anticoagulation   Euvolemic continue current meds  VT nonsustained.  Electrolytes were normal 10/18   No symptoms or signs to suggest interval LV dysfunction     Will continue on current meds   More than 50% of 40 min was spent in counseling related to the above        Stable continue current meds

## 2017-05-14 NOTE — Patient Instructions (Signed)
Medication Instructions:  Your physician recommends that you continue on your current medications as directed. Please refer to the Current Medication list given to you today.  Labwork: None ordered.  Testing/Procedures: None ordered.  Follow-Up: Your physician wants you to follow-up in: one year with Dr. Graciela HusbandsKlein.   You will receive a reminder letter in the mail two months in advance. If you don't receive a letter, please call our office to schedule the follow-up appointment.  Remote monitoring is used to monitor your Pacemaker from home. This monitoring reduces the number of office visits required to check your device to one time per year. It allows us to keep an eye on the functioning of your device to ensure it is working properly. You are scheduled for a device check from home on 08/16/2016. You may send your transmission at any time that day. If you have a wireless device, the transmission will be sent automatically. After your physician reviews your transmission, you will receive a postcard with your next transmission date.    Any Other Special Instructions Will Be Listed Below (If Applicable).     If you need a refill on your cardiac medications before your next appointment, please call your pharmacy.

## 2017-05-14 NOTE — Telephone Encounter (Signed)
Called [place] at work to confirm the received fax. Left message for them to call back if they have any questions

## 2017-05-19 ENCOUNTER — Other Ambulatory Visit: Payer: Self-pay | Admitting: Internal Medicine

## 2017-05-31 ENCOUNTER — Telehealth: Payer: Self-pay | Admitting: Internal Medicine

## 2017-05-31 MED ORDER — PIOGLITAZONE HCL 15 MG PO TABS: 15.0000 mg | ORAL_TABLET | Freq: Every day | ORAL | 1 refills | Status: DC

## 2017-05-31 NOTE — Telephone Encounter (Signed)
Reviewed chart pt is up-to-date sent refills to mail order../lmb  

## 2017-05-31 NOTE — Telephone Encounter (Signed)
Pt called and states he tried  pioglitazone (ACTOS) 15 MG tablet And it seems to be controlling his sugars very well and he would like the remaining refills [facility]

## 2017-06-07 DIAGNOSIS — E119 Type 2 diabetes mellitus without complications: Secondary | ICD-10-CM | POA: Diagnosis not present

## 2017-06-07 DIAGNOSIS — Z961 Presence of intraocular lens: Secondary | ICD-10-CM | POA: Diagnosis not present

## 2017-06-07 DIAGNOSIS — H401211 Low-tension glaucoma, right eye, mild stage: Secondary | ICD-10-CM | POA: Diagnosis not present

## 2017-06-07 DIAGNOSIS — H47292 Other optic atrophy, left eye: Secondary | ICD-10-CM | POA: Diagnosis not present

## 2017-06-07 DIAGNOSIS — H401222 Low-tension glaucoma, left eye, moderate stage: Secondary | ICD-10-CM | POA: Diagnosis not present

## 2017-06-07 DIAGNOSIS — H5203 Hypermetropia, bilateral: Secondary | ICD-10-CM | POA: Diagnosis not present

## 2017-06-07 LAB — HM DIABETES EYE EXAM

## 2017-06-09 ENCOUNTER — Encounter: Payer: Self-pay | Admitting: Internal Medicine

## 2017-06-09 NOTE — Progress Notes (Signed)
Abstracted and sent to scan  

## 2017-06-10 ENCOUNTER — Other Ambulatory Visit (INDEPENDENT_AMBULATORY_CARE_PROVIDER_SITE_OTHER): Payer: Medicare Other

## 2017-06-10 ENCOUNTER — Ambulatory Visit (INDEPENDENT_AMBULATORY_CARE_PROVIDER_SITE_OTHER): Payer: Medicare Other | Admitting: Internal Medicine

## 2017-06-10 ENCOUNTER — Encounter: Payer: Self-pay | Admitting: Internal Medicine

## 2017-06-10 VITALS — BP 124/82 | HR 75 | Temp 98.1°F | Ht 72.0 in | Wt 185.0 lb

## 2017-06-10 DIAGNOSIS — L299 Pruritus, unspecified: Secondary | ICD-10-CM | POA: Diagnosis not present

## 2017-06-10 DIAGNOSIS — R21 Rash and other nonspecific skin eruption: Secondary | ICD-10-CM | POA: Diagnosis not present

## 2017-06-10 LAB — COMPREHENSIVE METABOLIC PANEL
ALK PHOS: 80 U/L (ref 39–117)
ALT: 24 U/L (ref 0–53)
AST: 32 U/L (ref 0–37)
Albumin: 4.1 g/dL (ref 3.5–5.2)
BUN: 18 mg/dL (ref 6–23)
CHLORIDE: 105 meq/L (ref 96–112)
CO2: 21 meq/L (ref 19–32)
Calcium: 9.1 mg/dL (ref 8.4–10.5)
Creatinine, Ser: 1.53 mg/dL — ABNORMAL HIGH (ref 0.40–1.50)
GFR: 58.54 mL/min — ABNORMAL LOW (ref >60.00)
GLUCOSE: 154 mg/dL — AB (ref 70–99)
POTASSIUM: 4.6 meq/L (ref 3.5–5.1)
SODIUM: 135 meq/L (ref 135–145)
TOTAL PROTEIN: 6.6 g/dL (ref 6.0–8.3)
Total Bilirubin: 0.6 mg/dL (ref 0.2–1.2)

## 2017-06-10 LAB — CBC
HEMATOCRIT: 40.1 % (ref 39.0–52.0)
Hemoglobin: 12.9 g/dL — ABNORMAL LOW (ref 13.0–17.0)
MCHC: 32.1 g/dL (ref 30.0–36.0)
MCV: 91 fl (ref 78.0–100.0)
Platelets: 168 10*3/uL (ref 150.0–400.0)
RBC: 4.41 Mil/uL (ref 4.22–5.81)
RDW: 13.4 % (ref 11.5–15.5)
WBC: 6 10*3/uL (ref 4.0–10.5)

## 2017-06-10 LAB — TSH: TSH: 2.36 u[IU]/mL (ref 0.35–4.50)

## 2017-06-10 MED ORDER — TRIAMCINOLONE ACETONIDE 0.1 % EX CREA: 1.0000 "application " | TOPICAL_CREAM | Freq: Two times a day (BID) | CUTANEOUS | 0 refills | Status: DC

## 2017-06-10 NOTE — Progress Notes (Signed)
   Subjective:    Patient ID: Michael Snyder, male    DOB: 05/24/1951, 66 y.o.   MRN: [id number]  HPI The patient is a 66 YO man coming in for itching and rash on his left shoulder and chest. Started some time ago, possibly months. He does intermittently scratch and has some mild skin breakdown on the shoulder. His daughter saw him and wanted him to make a visit. He did start a new medication in the last month actos but the itching was going on before then. Denies fevers or chills. No cough. No new soap or shampoo or clothing. Given family history of leukemia he is concerned about that.   Review of Systems  Constitutional: Negative.   HENT: Negative.   Eyes: Negative.   Respiratory: Negative for cough, chest tightness and shortness of breath.   Cardiovascular: Negative for chest pain, palpitations and leg swelling.  Gastrointestinal: Negative for abdominal distention, abdominal pain, constipation, diarrhea, nausea and vomiting.  Musculoskeletal: Negative.   Skin: Positive for rash.  Neurological: Negative.   Psychiatric/Behavioral: Negative.       Objective:   Physical Exam  Constitutional: He is oriented to person, place, and time. He appears well-developed and well-nourished.  HENT:  Head: Normocephalic and atraumatic.  Eyes: EOM are normal.  Neck: Normal range of motion.  Cardiovascular: Normal rate and regular rhythm.  Pulmonary/Chest: Effort normal and breath sounds normal. No respiratory distress. He has no wheezes. He has no rales.  Abdominal: Soft. Bowel sounds are normal. He exhibits no distension. There is no tenderness. There is no rebound.  Musculoskeletal: He exhibits no edema.  Neurological: He is alert and oriented to person, place, and time. Coordination normal.  Skin: Skin is warm and dry.  Keloid on the left clavicle region, some stigmata of scratching with small scab.    Vitals:   06/10/17 1445  BP: 124/82  Pulse: 75  Temp: 98.1 F (36.7 C)  TempSrc: Oral    SpO2: 100%  Weight: 185 lb (83.9 kg)  Height: 6' (1.829 m)     Assessment & Plan:

## 2017-06-10 NOTE — Patient Instructions (Signed)
We will check the blood work today.   We have sent in a cream to use on the itchy spots twice a day.

## 2017-06-10 NOTE — Assessment & Plan Note (Signed)
Checking CBC, and CMP to rule out metabolic etiology. Prior hx hep c s/p treatment. Rx for triamcinolone cream.

## 2017-07-05 ENCOUNTER — Other Ambulatory Visit: Payer: Self-pay

## 2017-07-05 MED ORDER — PIOGLITAZONE HCL 15 MG PO TABS: 15.0000 mg | ORAL_TABLET | Freq: Every day | ORAL | 1 refills | Status: DC

## 2017-08-16 ENCOUNTER — Ambulatory Visit (INDEPENDENT_AMBULATORY_CARE_PROVIDER_SITE_OTHER): Payer: Medicare Other | Admitting: *Deleted

## 2017-08-16 DIAGNOSIS — I495 Sick sinus syndrome: Secondary | ICD-10-CM

## 2017-08-17 NOTE — Progress Notes (Signed)
Remote pacemaker transmission.   

## 2017-08-18 ENCOUNTER — Encounter: Payer: Self-pay | Admitting: Cardiology

## 2017-08-23 LAB — CUP PACEART REMOTE DEVICE CHECK
Battery Remaining Longevity: 139 mo
Battery Voltage: 2.79 V
Brady Statistic AP VP Percent: 0 %
Brady Statistic AS VP Percent: 1 %
Implantable Lead Implant Date: 20150918
Implantable Lead Location: 753860
Implantable Lead Model: 5076
Implantable Pulse Generator Implant Date: 20150918
Lead Channel Impedance Value: 479 Ohm
Lead Channel Pacing Threshold Amplitude: 0.625 V
Lead Channel Pacing Threshold Pulse Width: 0.4 ms
Lead Channel Setting Pacing Amplitude: 2 V
Lead Channel Setting Pacing Pulse Width: 0.4 ms
Lead Channel Setting Sensing Sensitivity: 2 mV
MDC IDC LEAD IMPLANT DT: 20080711
MDC IDC LEAD LOCATION: 753859
MDC IDC MSMT BATTERY IMPEDANCE: 183 Ohm
MDC IDC MSMT LEADCHNL RA PACING THRESHOLD PULSEWIDTH: 0.4 ms
MDC IDC MSMT LEADCHNL RV IMPEDANCE VALUE: 508 Ohm
MDC IDC MSMT LEADCHNL RV PACING THRESHOLD AMPLITUDE: 0.75 V
MDC IDC SESS DTM: 20190211143359
MDC IDC SET LEADCHNL RV PACING AMPLITUDE: 2.5 V
MDC IDC STAT BRADY AP VS PERCENT: 8 %
MDC IDC STAT BRADY AS VS PERCENT: 92 %

## 2017-11-15 ENCOUNTER — Ambulatory Visit (INDEPENDENT_AMBULATORY_CARE_PROVIDER_SITE_OTHER): Payer: Medicare Other | Admitting: *Deleted

## 2017-11-15 DIAGNOSIS — I495 Sick sinus syndrome: Secondary | ICD-10-CM

## 2017-11-16 DIAGNOSIS — Z86010 Personal history of colonic polyps: Secondary | ICD-10-CM | POA: Diagnosis not present

## 2017-11-16 DIAGNOSIS — Z1211 Encounter for screening for malignant neoplasm of colon: Secondary | ICD-10-CM | POA: Diagnosis not present

## 2017-11-16 DIAGNOSIS — K641 Second degree hemorrhoids: Secondary | ICD-10-CM | POA: Diagnosis not present

## 2017-11-16 LAB — CUP PACEART REMOTE DEVICE CHECK
Battery Impedance: 208 Ohm
Brady Statistic AP VS Percent: 7 %
Brady Statistic AS VS Percent: 92 %
Date Time Interrogation Session: 20190513115854
Implantable Lead Implant Date: 20080711
Implantable Lead Implant Date: 20150918
Implantable Lead Location: 753859
Implantable Lead Model: 5076
Lead Channel Impedance Value: 476 Ohm
Lead Channel Impedance Value: 500 Ohm
Lead Channel Pacing Threshold Amplitude: 0.75 V
Lead Channel Pacing Threshold Pulse Width: 0.4 ms
Lead Channel Sensing Intrinsic Amplitude: 5.6 mV
Lead Channel Setting Pacing Amplitude: 2 V
Lead Channel Setting Sensing Sensitivity: 2.8 mV
MDC IDC LEAD LOCATION: 753860
MDC IDC MSMT BATTERY REMAINING LONGEVITY: 134 mo
MDC IDC MSMT BATTERY VOLTAGE: 2.79 V
MDC IDC MSMT LEADCHNL RA PACING THRESHOLD AMPLITUDE: 0.625 V
MDC IDC MSMT LEADCHNL RA PACING THRESHOLD PULSEWIDTH: 0.4 ms
MDC IDC PG IMPLANT DT: 20150918
MDC IDC SET LEADCHNL RV PACING AMPLITUDE: 2.5 V
MDC IDC SET LEADCHNL RV PACING PULSEWIDTH: 0.4 ms
MDC IDC STAT BRADY AP VP PERCENT: 0 %
MDC IDC STAT BRADY AS VP PERCENT: 1 %

## 2017-11-16 NOTE — Progress Notes (Signed)
Remote pacemaker transmission.   

## 2017-11-17 ENCOUNTER — Encounter: Payer: Self-pay | Admitting: Cardiology

## 2017-11-17 LAB — HEPATIC FUNCTION PANEL
ALK PHOS: 89 (ref 25–125)
ALT: 19 (ref 10–40)
AST: 25 (ref 14–40)
BILIRUBIN DIRECT: 0.18 (ref 0.01–0.4)
Bilirubin, Total: 0.5

## 2017-11-17 LAB — BASIC METABOLIC PANEL
BUN: 20 (ref 4–21)
Creatinine: 1.6 — AB (ref 0.6–1.3)
Glucose: 293
POTASSIUM: 4.1 (ref 3.4–5.3)
Sodium: 139 (ref 137–147)

## 2017-11-17 LAB — CBC AND DIFFERENTIAL
HEMATOCRIT: 41 (ref 41–53)
HEMOGLOBIN: 13.2 — AB (ref 13.5–17.5)
Platelets: 189 (ref 150–399)
WBC: 5.4

## 2017-11-18 ENCOUNTER — Encounter: Payer: Self-pay | Admitting: Internal Medicine

## 2017-11-18 NOTE — Progress Notes (Signed)
Abstracted and sent to scan  

## 2017-11-19 ENCOUNTER — Telehealth: Payer: Self-pay | Admitting: Internal Medicine

## 2017-11-19 NOTE — Telephone Encounter (Signed)
States when he went for prep for colonoscopy he got blood work done and was told his kidney function was not good and sugars where up. He had them fax the paper work and wanted Dr. Okey Dupre to look it over and give opinion on the lab work because he is worried

## 2017-11-19 NOTE — Telephone Encounter (Signed)
Patient informed and stated understanding.

## 2017-11-19 NOTE — Telephone Encounter (Unsigned)
Copied from CRM #[id number]. Topic: Quick Communication - See Telephone Encounter >> Nov 19, 2017  1:24 PM Floria Raveling A wrote: CRM for notification. See Telephone encounter for: 11/19/17.  Pt called in and said that Dr Loreta Ave did his blood work and said that he would like to dr Okey Dupre about those results.  He would like cma to call him  Best number  [phone]

## 2017-11-19 NOTE — Telephone Encounter (Signed)
Labs are stable from before so should not affect the colonoscopy. He does have some chronic changes to the kidneys but they have not changed in some time.

## 2017-11-23 ENCOUNTER — Ambulatory Visit: Payer: Medicare Other | Admitting: Internal Medicine

## 2017-12-08 DIAGNOSIS — H401211 Low-tension glaucoma, right eye, mild stage: Secondary | ICD-10-CM | POA: Diagnosis not present

## 2017-12-08 DIAGNOSIS — H401222 Low-tension glaucoma, left eye, moderate stage: Secondary | ICD-10-CM | POA: Diagnosis not present

## 2017-12-15 ENCOUNTER — Encounter: Payer: Self-pay | Admitting: Internal Medicine

## 2017-12-15 DIAGNOSIS — Z1211 Encounter for screening for malignant neoplasm of colon: Secondary | ICD-10-CM | POA: Diagnosis not present

## 2017-12-15 DIAGNOSIS — Z86010 Personal history of colonic polyps: Secondary | ICD-10-CM | POA: Diagnosis not present

## 2017-12-15 LAB — HM COLONOSCOPY

## 2017-12-15 NOTE — Progress Notes (Signed)
Abstracted and sent to scan  

## 2017-12-27 ENCOUNTER — Telehealth: Payer: Self-pay | Admitting: Internal Medicine

## 2017-12-27 NOTE — Telephone Encounter (Signed)
Received a call from [name] regarding his AWV appt. LVM letting patient know date and time of his visit. SF

## 2017-12-29 ENCOUNTER — Other Ambulatory Visit: Payer: Self-pay | Admitting: Internal Medicine

## 2017-12-30 ENCOUNTER — Ambulatory Visit (INDEPENDENT_AMBULATORY_CARE_PROVIDER_SITE_OTHER): Payer: Medicare Other | Admitting: *Deleted

## 2017-12-30 VITALS — BP 138/82 | HR 77 | Resp 17 | Ht 72.0 in | Wt 181.0 lb

## 2017-12-30 DIAGNOSIS — E119 Type 2 diabetes mellitus without complications: Secondary | ICD-10-CM

## 2017-12-30 DIAGNOSIS — Z0000 Encounter for general adult medical examination without abnormal findings: Secondary | ICD-10-CM | POA: Diagnosis not present

## 2017-12-30 DIAGNOSIS — Z23 Encounter for immunization: Secondary | ICD-10-CM | POA: Diagnosis not present

## 2017-12-30 MED ORDER — ZOSTER VAC RECOMB ADJUVANTED 50 MCG/0.5ML IM SUSR: 0.5000 mL | Freq: Once | INTRAMUSCULAR | 1 refills | Status: AC

## 2017-12-30 MED ORDER — ONETOUCH VERIO IQ SYSTEM W/DEVICE KIT: PACK | 0 refills | Status: DC

## 2017-12-30 NOTE — Patient Instructions (Addendum)
Tips for Eating Away From Home If You Have Diabetes Controlling your level of blood glucose, also known as blood sugar, can be challenging. It can be even more difficult when you do not prepare your own meals. The following tips can help you manage your diabetes when you eat away from home. Planning ahead Plan ahead if you know you will be eating away from home:  Ask your health care provider how to time meals and medicine if you are taking insulin.  Make a list of restaurants near you that offer healthy choices. If they have a carry-out menu, take it home and plan what you will order ahead of time.  Look up the restaurant you want to eat at online. Many chain and fast-food restaurants list nutritional information online. Use this information to choose the healthiest options and to calculate how many carbohydrates will be in your meal.  Use a carbohydrate-counting book or mobile app to look up the carbohydrate content and serving size of the foods you want to eat.  Become familiar with serving sizes and learn to recognize how many servings are in a portion. This will allow you to estimate how many carbohydrates you can eat.  Free foods A "free food" is any food or drink that has less than 5 g of carbohydrates per serving. Free foods include:  Many vegetables.  Hard boiled eggs.  Nuts or seeds.  Olives.  Cheeses.  Meats.  These types of foods make good appetizer choices and are often available at salad bars. Lemon juice, vinegar, or a low-calorie salad dressing of fewer than 20 calories per serving can be used as a "free" salad dressing. Choices to reduce carbohydrates  Substitute nonfat sweetened yogurt with a sugar-free yogurt. Yogurt made from soy milk may also be used, but you will still want a sugar-free or plain option to choose a lower carbohydrate amount.  Ask your server to take away the bread basket or chips from your table.  Order fresh fruit. A salad bar often offers  fresh fruit choices. Avoid canned fruit because it is usually packed in sugar or syrup.  Order a salad, and eat it without dressing. Or, create a "free" salad dressing.  Ask for substitutions. For example, instead of [place] fries, request an order of a vegetable such as salad, green beans, or broccoli. Other tips  If you take insulin, take the insulin once your food arrives to your table. This will ensure your insulin and food are timed correctly.  Ask your server about the portion size before your order, and ask for a take-out box if the portion has more servings than you should have. When your food comes, leave the amount you should have on the plate, and put the rest in the take-out box.  Consider splitting an entree with someone and ordering a side salad. This information is not intended to replace advice given to you by your health care provider. Make sure you discuss any questions you have with your health care provider. Document Released: 06/22/2005 Document Revised: 11/28/2015 Document Reviewed: 09/19/2013 Elsevier Interactive Patient Education  2018 [employer]. [facility]:  [place] in [place], [place] Address: [address] [place], [place], [place] [address] Phone: [phone]  Continue doing brain stimulating activities (puzzles, reading, adult coloring books, staying active) to keep memory sharp.   Continue to eat heart healthy diet (full of fruits, vegetables, whole grains, lean protein, water--limit salt, fat, and sugar intake) and increase physical activity as tolerated.  Michael Snyder , Thank you for taking time to come for your Medicare Wellness Visit. I appreciate your ongoing commitment to your health goals. Please review the following plan we discussed and let me know if I can assist you in the future.   These are the goals we discussed: Goals    . Patient Stated     Get my diabetes in better control by starting to count carbohydrates and  monitoring sugar.        This is a list of the screening recommended for you and due dates:  Health Maintenance  Topic Date Due  . Pneumonia vaccines (2 of 2 - PPSV23) 09/13/2017  . Hemoglobin A1C  10/04/2017  . Flu Shot  02/03/2018  . Tetanus Vaccine  03/30/2018  . Complete foot exam   04/05/2018  . Eye exam for diabetics  06/07/2018  . Colon Cancer Screening  12/16/2022  .  Hepatitis C: One time screening is recommended by Center for Disease Control  (CDC) for  adults born from [birth year] through 1966.   Completed    Diabetes Mellitus and Nutrition When you have diabetes (diabetes mellitus), it is very important to have healthy eating habits because your blood sugar (glucose) levels are greatly affected by what you eat and drink. Eating healthy foods in the appropriate amounts, at about the same times every day, can help you:  Control your blood glucose.  Lower your risk of heart disease.  Improve your blood pressure.  Reach or maintain a healthy weight.  Every person with diabetes is different, and each person has different needs for a meal plan. Your health care provider may recommend that you work with a diet and nutrition specialist (dietitian) to make a meal plan that is best for you. Your meal plan may vary depending on factors such as:  The calories you need.  The medicines you take.  Your weight.  Your blood glucose, blood pressure, and cholesterol levels.  Your activity level.  Other health conditions you have, such as heart or kidney disease.  How do carbohydrates affect me? Carbohydrates affect your blood glucose level more than any other type of food. Eating carbohydrates naturally increases the amount of glucose in your blood. Carbohydrate counting is a method for keeping track of how many carbohydrates you eat. Counting carbohydrates is important to keep your blood glucose at a healthy level, especially if you use insulin or take certain oral diabetes  medicines. It is important to know how many carbohydrates you can safely have in each meal. This is different for every person. Your dietitian can help you calculate how many carbohydrates you should have at each meal and for snack. Foods that contain carbohydrates include:  Bread, cereal, rice, pasta, and crackers.  Potatoes and corn.  Peas, beans, and lentils.  Milk and yogurt.  Fruit and juice.  Desserts, such as cakes, cookies, ice cream, and candy.  How does alcohol affect me? Alcohol can cause a sudden decrease in blood glucose (hypoglycemia), especially if you use insulin or take certain oral diabetes medicines. Hypoglycemia can be a life-threatening condition. Symptoms of hypoglycemia (sleepiness, dizziness, and confusion) are similar to symptoms of having too much alcohol. If your health care provider says that alcohol is safe for you, follow these guidelines:  Limit alcohol intake to no more than 1 drink per day for nonpregnant women and 2 drinks per day for men. One drink equals 12 oz of beer, 5 oz of wine, or 1 oz of  hard liquor.  Do not drink on an empty stomach.  Keep yourself hydrated with water, diet soda, or unsweetened iced tea.  Keep in mind that regular soda, juice, and other mixers may contain a lot of sugar and must be counted as carbohydrates.  What are tips for following this plan? Reading food labels  Start by checking the serving size on the label. The amount of calories, carbohydrates, fats, and other nutrients listed on the label are based on one serving of the food. Many foods contain more than one serving per package.  Check the total grams (g) of carbohydrates in one serving. You can calculate the number of servings of carbohydrates in one serving by dividing the total carbohydrates by 15. For example, if a food has 30 g of total carbohydrates, it would be equal to 2 servings of carbohydrates.  Check the number of grams (g) of saturated and trans  fats in one serving. Choose foods that have low or no amount of these fats.  Check the number of milligrams (mg) of sodium in one serving. Most people should limit total sodium intake to less than 2,300 mg per day.  Always check the nutrition information of foods labeled as "low-fat" or "nonfat". These foods may be higher in added sugar or refined carbohydrates and should be avoided.  Talk to your dietitian to identify your daily goals for nutrients listed on the label. Shopping  Avoid buying canned, premade, or processed foods. These foods tend to be high in fat, sodium, and added sugar.  Shop around the outside edge of the grocery store. This includes fresh fruits and vegetables, bulk grains, fresh meats, and fresh dairy. Cooking  Use low-heat cooking methods, such as baking, instead of high-heat cooking methods like deep frying.  Cook using healthy oils, such as olive, canola, or sunflower oil.  Avoid cooking with butter, cream, or high-fat meats. Meal planning  Eat meals and snacks regularly, preferably at the same times every day. Avoid going long periods of time without eating.  Eat foods high in fiber, such as fresh fruits, vegetables, beans, and whole grains. Talk to your dietitian about how many servings of carbohydrates you can eat at each meal.  Eat 4-6 ounces of lean protein each day, such as lean meat, chicken, fish, eggs, or tofu. 1 ounce is equal to 1 ounce of meat, chicken, or fish, 1 egg, or 1/4 cup of tofu.  Eat some foods each day that contain healthy fats, such as avocado, nuts, seeds, and fish. Lifestyle   Check your blood glucose regularly.  Exercise at least 30 minutes 5 or more days each week, or as told by your health care provider.  Take medicines as told by your health care provider.  Do not use any products that contain nicotine or tobacco, such as cigarettes and e-cigarettes. If you need help quitting, ask your health care provider.  Work with a  Veterinary surgeoncounselor or diabetes educator to identify strategies to manage stress and any emotional and social challenges. What are some questions to ask my health care provider?  Do I need to meet with a diabetes educator?  Do I need to meet with a dietitian?  What number can I call if I have questions?  When are the best times to check my blood glucose? Where to find more information:  American Diabetes Association: diabetes.org/food-and-fitness/food  Academy of Nutrition and Dietetics: https://www.vargas.com/www.eatright.org/resources/health/diseases-and-conditions/diabetes  [employer] Institute of Diabetes and Digestive and Kidney Diseases (NIH): FindJewelers.czwww.niddk.nih.gov/health-information/diabetes/overview/diet-eating-physical-activity Summary  A  healthy meal plan will help you control your blood glucose and maintain a healthy lifestyle.  Working with a diet and nutrition specialist (dietitian) can help you make a meal plan that is best for you.  Keep in mind that carbohydrates and alcohol have immediate effects on your blood glucose levels. It is important to count carbohydrates and to use alcohol carefully. This information is not intended to replace advice given to you by your health care provider. Make sure you discuss any questions you have with your health care provider. Document Released: 03/19/2005 Document Revised: 07/27/2016 Document Reviewed: 07/27/2016 Elsevier Interactive Patient Education  2018 [employer].  Carbohydrate Counting for Diabetes Mellitus, Adult Carbohydrate counting is a method for keeping track of how many carbohydrates you eat. Eating carbohydrates naturally increases the amount of sugar (glucose) in the blood. Counting how many carbohydrates you eat helps keep your blood glucose within normal limits, which helps you manage your diabetes (diabetes mellitus). It is important to know how many carbohydrates you can safely have in each meal. This is different for every person. A diet and  nutrition specialist (registered dietitian) can help you make a meal plan and calculate how many carbohydrates you should have at each meal and snack. Carbohydrates are found in the following foods:  Grains, such as breads and cereals.  Dried beans and soy products.  Starchy vegetables, such as potatoes, peas, and corn.  Fruit and fruit juices.  Milk and yogurt.  Sweets and snack foods, such as cake, cookies, candy, chips, and soft drinks.  How do I count carbohydrates? There are two ways to count carbohydrates in food. You can use either of the methods or a combination of both. Reading "Nutrition Facts" on packaged food The "Nutrition Facts" list is included on the labels of almost all packaged foods and beverages in the [place] It includes:  The serving size.  Information about nutrients in each serving, including the grams (g) of carbohydrate per serving.  To use the "Nutrition Facts":  Decide how many servings you will have.  Multiply the number of servings by the number of carbohydrates per serving.  The resulting number is the total amount of carbohydrates that you will be having.  Learning standard serving sizes of other foods When you eat foods containing carbohydrates that are not packaged or do not include "Nutrition Facts" on the label, you need to measure the servings in order to count the amount of carbohydrates:  Measure the foods that you will eat with a food scale or measuring cup, if needed.  Decide how many standard-size servings you will eat.  Multiply the number of servings by 15. Most carbohydrate-rich foods have about 15 g of carbohydrates per serving. ? For example, if you eat 8 oz (170 g) of strawberries, you will have eaten 2 servings and 30 g of carbohydrates (2 servings x 15 g = 30 g).  For foods that have more than one food mixed, such as soups and casseroles, you must count the carbohydrates in each food that is included.  The following list  contains standard serving sizes of common carbohydrate-rich foods. Each of these servings has about 15 g of carbohydrates:   hamburger bun or  English muffin.   oz (15 mL) syrup.   oz (14 g) jelly.  1 slice of bread.  1 six-inch tortilla.  3 oz (85 g) cooked rice or pasta.  4 oz (113 g) cooked dried beans.  4 oz (113 g) starchy vegetable, such  as peas, corn, or potatoes.  4 oz (113 g) hot cereal.  4 oz (113 g) mashed potatoes or  of a large baked potato.  4 oz (113 g) canned or frozen fruit.  4 oz (120 mL) fruit juice.  4-6 crackers.  6 chicken nuggets.  6 oz (170 g) unsweetened dry cereal.  6 oz (170 g) plain fat-free yogurt or yogurt sweetened with artificial sweeteners.  8 oz (240 mL) milk.  8 oz (170 g) fresh fruit or one small piece of fruit.  24 oz (680 g) popped popcorn.  Example of carbohydrate counting Sample meal  3 oz (85 g) chicken breast.  6 oz (170 g) brown rice.  4 oz (113 g) corn.  8 oz (240 mL) milk.  8 oz (170 g) strawberries with sugar-free whipped topping. Carbohydrate calculation 1. Identify the foods that contain carbohydrates: ? Rice. ? Corn. ? Milk. ? Strawberries. 2. Calculate how many servings you have of each food: ? 2 servings rice. ? 1 serving corn. ? 1 serving milk. ? 1 serving strawberries. 3. Multiply each number of servings by 15 g: ? 2 servings rice x 15 g = 30 g. ? 1 serving corn x 15 g = 15 g. ? 1 serving milk x 15 g = 15 g. ? 1 serving strawberries x 15 g = 15 g. 4. Add together all of the amounts to find the total grams of carbohydrates eaten: ? 30 g + 15 g + 15 g + 15 g = 75 g of carbohydrates total. This information is not intended to replace advice given to you by your health care provider. Make sure you discuss any questions you have with your health care provider. Document Released: 06/22/2005 Document Revised: 01/10/2016 Document Reviewed: 12/04/2015 Elsevier Interactive Patient Education  [employer].

## 2017-12-30 NOTE — Progress Notes (Signed)
Subjective:   Michael Snyder is a 67 y.o. male who presents for Medicare Annual/Subsequent preventive examination.  Review of Systems:  No ROS.  Medicare Wellness Visit. Additional risk factors are reflected in the social history.  Cardiac Risk Factors include: advanced age (>65mn, >>33women);diabetes mellitus;hypertension;male gender Sleep patterns: feels rested on waking, gets up 1 times nightly to void and sleeps 6 hours nightly.    Home Safety/Smoke Alarms: Feels safe in home. Smoke alarms in place.  Living environment; residence and Firearm Safety: 1-story house/ trailer, no firearms. Lives with wife, no needs for DME, good support system Seat Belt Safety/Bike Helmet: Wears seat belt.     Objective:    Vitals: BP 138/82   Pulse 77   Resp 17   Ht 6' (1.829 m)   Wt 181 lb (82.1 kg)   SpO2 100%   BMI 24.55 kg/m   Body mass index is 24.55 kg/m.  Advanced Directives 12/30/2017 03/24/2014 03/23/2014  Does Patient Have a Medical Advance Directive? Yes No No  Type of [place]amed[place]of [place] - -  Copy of [place] Chart? No - copy requested - -  Would patient like information on creating a medical advance directive? - No - patient declined information No - patient declined information    Tobacco Social History   Tobacco Use  Smoking Status Never Smoker  Smokeless Tobacco Never Used     Counseling given: Not Answered  Past Medical History:  Diagnosis Date  . Alcohol abuse   . Arthritis   . Diabetes mellitus, type 2 ([place]   . Head injury, closed, without LOC 1980s  . History of alcohol abuse   . Hypertension   . 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 [place]    [place]   . [place]    last time in 1990's   Past Surgical History:  Procedure Laterality Date  . LACERATION REPAIR  1980   scalp  . PACEMAKER LEAD REMOVAL N/A 03/23/2014   Procedure:  PACEMAKER LEAD REMOVAL;  Surgeon: GEvans Lance MD;  [facility]  Service: Cardiovascular;  Laterality: N/A;  . PTDVP     7/08 medtronic DDD   Family History  Problem Relation Age of Onset  . Hypertension Mother   . COPD Mother   . Hypertension Brother   . Cancer Brother        leukemia  . Hypertension Brother    Social History   Socioeconomic History  . Marital status: Married    Spouse name: Not on file  . Number of children: 3  . Years of education: Not on file  . Highest education level: Not on file  Occupational History  . Occupation: cGames developer truck dAnimatorNeeds  . Financial resource strain: Not hard at all  . Food insecurity:    Worry: Never true    Inability: Never true  . Transportation needs:    Medical: No    Non-medical: No  Tobacco Use  . Smoking status: Never Smoker  . Smokeless tobacco: Never Used  Substance and Sexual Activity  . Alcohol use: No    Comment: quit in 1990's  . Drug use: No    Comment: quit in 1990's  . Sexual activity: Yes    Partners: Female  Lifestyle  . Physical activity:    Days per week: 4 days  Minutes per session: 50 min  . Stress: Not at all  Relationships  . Social connections:    Talks on phone: More than three times a week    Gets together: More than three times a week    Attends religious service: More than 4 times per year    Active member of club or organization: Yes    Attends meetings of clubs or organizations: More than 4 times per year    Relationship status: Married  Other Topics Concern  . Not on file  Social History Narrative   12th grade education. H/O substance abuse- now abstemious and works with other in recovery..Attends NA on a regular basis and serves as a sponsor. Married 18 years-divorced. Married 5 years 2nd marriage.3 daughters previous marriage, 9 grandchildren, 3 great-grands. Work- Merchant navy officer             Outpatient Encounter Medications as of 12/30/2017    Medication Sig  . ALPHAGAN P 0.1 % SOLN Place 2 drops into the left eye daily.  [name] aspirin EC 81 MG tablet Take 81 mg daily by mouth.  . Blood Glucose Monitoring Suppl (ONETOUCH VERIO IQ SYSTEM) w/Device KIT USE TO CHECK BLOOD SUGARS  TWICE A DAY  . Calcium Carbonate-Vitamin D (CALTRATE 600+D) 600-400 MG-UNIT per tablet Take 1 tablet by mouth 2 (two) times daily.  [name] gabapentin (NEURONTIN) 300 MG capsule Take 300 mg by mouth every 6 (six) hours as needed (pain).   [name] glipiZIDE (GLUCOTROL) 5 MG tablet TAKE 1 TABLET BY MOUTH  TWICE A DAY BEFORE MEALS  . lisinopril-hydrochlorothiazide (PRINZIDE,ZESTORETIC) 10-12.5 MG tablet TAKE 1 TABLET BY MOUTH  DAILY  . Multiple Vitamin (MULTIVITAMIN WITH MINERALS) TABS tablet Take 1 tablet by mouth daily.  . Omega-3 Fatty Acids (FISH OIL) 300 MG CAPS Take 1 capsule (300 mg total) by mouth daily.  Glory Rosebush DELICA LANCETS 48N MISC CHECK BLOOD SUGAR TWO TIMES DAILY  . ONETOUCH VERIO test strip TEST TWO TIMES DAILY  . pioglitazone (ACTOS) 15 MG tablet TAKE 1 TABLET BY MOUTH  DAILY  . potassium chloride SA (K-DUR,KLOR-CON) 20 MEQ tablet TAKE 1 TABLET BY MOUTH TWO  TIMES DAILY  . [DISCONTINUED] Blood Glucose Monitoring Suppl (ONETOUCH VERIO IQ SYSTEM) w/Device KIT USE TO CHECK BLOOD SUGARS  TWICE A DAY  . Zoster Vaccine Adjuvanted [facility]) injection Inject 0.5 mLs into the muscle once for 1 dose.  . [DISCONTINUED] lidocaine (XYLOCAINE) 2 % solution Use as directed 20 mLs in the mouth or throat as needed for mouth pain. (Patient not taking: Reported on 12/30/2017)  . [DISCONTINUED] triamcinolone cream (KENALOG) 0.1 % Apply 1 application topically 2 (two) times daily. (Patient not taking: Reported on 12/30/2017)   No facility-administered encounter medications on file as of 12/30/2017.     Activities of Daily Living In your present state of health, do you have any difficulty performing the following activities: 12/30/2017  Hearing? N  Vision? N  Difficulty  concentrating or making decisions? N  Walking or climbing stairs? N  Dressing or bathing? N  Doing errands, shopping? N  Preparing Food and eating ? N  Using the Toilet? N  In the past six months, have you accidently leaked urine? N  Do you have problems with loss of bowel control? N  Managing your Medications? N  Managing your Finances? N  Housekeeping or managing your Housekeeping? N  Some recent data might be hidden    Patient Care Team: Hoyt Koch, MD as  PCP - General (Internal Medicine)   Assessment:   This is a routine wellness examination for Demarques. Physical assessment deferred to PCP.   Exercise Activities and Dietary recommendations Current Exercise Habits: The patient has a physically strenous job, but has no regular exercise apart from work.  Diet (meal preparation, eat out, water intake, caffeinated beverages, dairy products, fruits and vegetables): in general, a "healthy" diet  , well balanced   Reviewed heart healthy and diabetic diet, Encouraged patient to increase daily water and fluid intake. Diet education was attached to patient's AVS.     Goals    . Patient Stated     Get my diabetes in better control by starting to count carbohydrates and monitoring sugar.        Fall Risk Fall Risk  12/30/2017 04/05/2017 08/21/2015  Falls in the past year? No No No    Depression Screen PHQ 2/9 Scores 12/30/2017 04/05/2017 08/21/2015  PHQ - 2 Score 0 0 0    Cognitive Function       Ad8 score reviewed for issues:  Issues making decisions: no  Less interest in hobbies / activities: no  Repeats questions, stories (family complaining): no  Trouble using ordinary gadgets (microwave, computer, phone):no  Forgets the month or year: no  Mismanaging finances: no  Remembering appts: no  Daily problems with thinking and/or memory: no Ad8 score is= 0  Immunization History  Administered Date(s) Administered  . Influenza Whole 03/30/2008, 04/16/2009,  06/05/2010  . Influenza, High Dose Seasonal PF 06/02/2016, 04/05/2017  . Influenza,inj,Quad PF,6+ Mos 05/16/2013, 03/08/2014, 08/21/2015  . Pneumococcal Conjugate-13 11/01/2014  . Pneumococcal Polysaccharide-23 09/13/2012, 12/30/2017  . Td 03/30/2008  . Zoster 07/20/2014   Screening Tests Health Maintenance  Topic Date Due  . HEMOGLOBIN A1C  10/04/2017  . INFLUENZA VACCINE  02/03/2018  . TETANUS/TDAP  03/30/2018  . FOOT EXAM  04/05/2018  . OPHTHALMOLOGY EXAM  06/07/2018  . COLONOSCOPY  12/16/2022  . Hepatitis C Screening  Completed  . PNA vac Low Risk Adult  Completed        Plan:     Shingrix vaccine prescription sent to pharmacy and glucose monitor ordered per patient's request.  Continue doing brain stimulating activities (puzzles, reading, adult coloring books, staying active) to keep memory sharp.   Continue to eat heart healthy diet (full of fruits, vegetables, whole grains, lean protein, water--limit salt, fat, and sugar intake) and increase physical activity as tolerated.  I have personally reviewed and noted the following in the patient's chart:   . Medical and social history . Use of alcohol, tobacco or illicit drugs  . Current medications and supplements . Functional ability and status . Nutritional status . Physical activity . Advanced directives . List of other physicians . Vitals . Screenings to include cognitive, depression, and falls . Referrals and appointments  In addition, I have reviewed and discussed with patient certain preventive protocols, quality metrics, and best practice recommendations. A written personalized care plan for preventive services as well as general preventive health recommendations were provided to patient.     Michiel Cowboy, RN  12/30/2017

## 2017-12-31 NOTE — Progress Notes (Signed)
Medical screening examination/treatment/procedure(s) were performed by non-physician practitioner and as supervising physician I was immediately available for consultation/collaboration. I agree with above. Ainsley Sanguinetti A Lelar Farewell, MD 

## 2018-02-14 ENCOUNTER — Encounter: Payer: Medicare Other | Admitting: *Deleted

## 2018-02-14 ENCOUNTER — Telehealth: Payer: Self-pay | Admitting: Cardiology

## 2018-02-14 NOTE — Telephone Encounter (Signed)
Spoke with pt and reminded pt of remote transmission that is due today. Pt verbalized understanding.   

## 2018-02-16 ENCOUNTER — Encounter: Payer: Self-pay | Admitting: Cardiology

## 2018-03-29 ENCOUNTER — Encounter: Payer: Self-pay | Admitting: Family

## 2018-03-29 ENCOUNTER — Ambulatory Visit (INDEPENDENT_AMBULATORY_CARE_PROVIDER_SITE_OTHER): Payer: Medicare Other | Admitting: Family

## 2018-03-29 VITALS — BP 122/82 | HR 86 | Temp 97.8°F | Ht 72.0 in | Wt 183.0 lb

## 2018-03-29 DIAGNOSIS — Z23 Encounter for immunization: Secondary | ICD-10-CM

## 2018-03-29 DIAGNOSIS — S90829A Blister (nonthermal), unspecified foot, initial encounter: Secondary | ICD-10-CM | POA: Diagnosis not present

## 2018-03-29 MED ORDER — SULFAMETHOXAZOLE-TRIMETHOPRIM 800-160 MG PO TABS: 1.0000 | ORAL_TABLET | Freq: Two times a day (BID) | ORAL | 0 refills | Status: DC

## 2018-03-29 MED ORDER — MUPIROCIN 2 % EX OINT: TOPICAL_OINTMENT | CUTANEOUS | 0 refills | Status: DC

## 2018-03-29 NOTE — Progress Notes (Signed)
Michael Snyder is a 67 y.o. male with the following history as recorded in EpicCare:  Patient Active Problem List   Diagnosis Date Noted  . Rash 06/10/2017  . Hep C w/o coma, chronic ([place]) 12/24/2015  . Mechanical complication of other vascular device, implant, and graft 03/23/2014  . Complications, pacemaker cardiac, 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/2014  . Atrial fibrillation ([place]) 11/17/2012  . Cardiac pacemaker MDT 11/26/2011  . Routine health maintenance 06/16/2011  . Essential hypertension 06/01/2008  . Diabetes mellitus type 2, controlled, without complications ([place]) 86/76/1951  . SICK SINUS SYNDROME 03/26/2007  . SEIZURE DISORDER 03/26/2007    Current Outpatient Medications  Medication Sig Dispense Refill  . ALPHAGAN P 0.1 % SOLN Place 2 drops into the left eye daily.    [name] aspirin EC 81 MG tablet Take 81 mg daily by mouth.    . Blood Glucose Monitoring Suppl (ONETOUCH VERIO IQ SYSTEM) w/Device KIT USE TO CHECK BLOOD SUGARS  TWICE A DAY 1 kit 0  . Calcium Carbonate-Vitamin D (CALTRATE 600+D) 600-400 MG-UNIT per tablet Take 1 tablet by mouth 2 (two) times daily. 180 tablet 3  . gabapentin (NEURONTIN) 300 MG capsule Take 300 mg by mouth every 6 (six) hours as needed (pain).     [name] glipiZIDE (GLUCOTROL) 5 MG tablet TAKE 1 TABLET BY MOUTH  TWICE A DAY BEFORE MEALS 180 tablet 3  . lisinopril-hydrochlorothiazide (PRINZIDE,ZESTORETIC) 10-12.5 MG tablet TAKE 1 TABLET BY MOUTH  DAILY 90 tablet 3  . Multiple Vitamin (MULTIVITAMIN WITH MINERALS) TABS tablet Take 1 tablet by mouth daily.    . mupirocin ointment (BACTROBAN) 2 % Apply bid to affected area 30 g 0  . Omega-3 Fatty Acids (FISH OIL) 300 MG CAPS Take 1 capsule (300 mg total) by mouth daily. 90 capsule 3  . ONETOUCH DELICA LANCETS 93O MISC CHECK BLOOD SUGAR TWO TIMES DAILY 200 each 11  . ONETOUCH VERIO test strip TEST TWO TIMES DAILY 200 each 3  . pioglitazone (ACTOS) 15 MG tablet TAKE 1 TABLET BY MOUTH  DAILY 90 tablet 1  . potassium chloride SA  (K-DUR,KLOR-CON) 20 MEQ tablet TAKE 1 TABLET BY MOUTH TWO  TIMES DAILY 180 tablet 3  . sulfamethoxazole-trimethoprim (BACTRIM DS,SEPTRA DS) 800-160 MG tablet Take 1 tablet by mouth 2 (two) times daily. 14 tablet 0   No current facility-administered medications for this visit.     Allergies: Patient has no known allergies.  Past Medical History:  Diagnosis Date  . Alcohol abuse   . Arthritis   . Diabetes mellitus, type 2 ([place])   . Head injury, closed, without LOC 1980s  . History of alcohol abuse   . Hypertension   . 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 Pacemaker    [place]  . Seizure disorder ([place])   . Seizures ([place])    last time in 1990's    Past Surgical History:  Procedure Laterality Date  . LACERATION REPAIR  1980   scalp  . PACEMAKER LEAD REMOVAL N/A 03/23/2014   Procedure: PACEMAKER LEAD REMOVAL;  Surgeon: Evans Lance, MD;  Location: [facility];  Service: Cardiovascular;  Laterality: N/A;  . PTDVP     7/08 medtronic DDD    Family History  Problem Relation Age of Onset  . Hypertension Mother   . COPD Mother   . Hypertension Brother   . Cancer Brother        leukemia  . Hypertension Brother     Social  History   Tobacco Use  . Smoking status: Never Smoker  . Smokeless tobacco: Never Used  Substance Use Topics  . Alcohol use: No    Comment: quit in 1990's    Subjective:  Concerned for "sores" on top of both feet; thinks his boots may have rubbed a blister; is a diabetic; denies any pain; using topical Neosporin;   Objective:  Vitals:   03/29/18 1612  BP: 122/82  Pulse: 86  Temp: 97.8 F (36.6 C)  TempSrc: Oral  SpO2: 99%  Weight: 183 lb 0.6 oz (83 kg)  Height: 6' (1.829 m)    General: Well developed, well nourished, in no acute distress  Skin : Warm and dry. Blister areas noted on tops of both feet with surrounding erythema  Head: Normocephalic and atraumatic  Lungs: Respirations unlabored; Extremities: No edema, cyanosis,  clubbing  Vessels: Symmetric bilaterally  Neurologic: Alert and oriented; speech intact; face symmetrical; moves all extremities well; CNII-XII intact without focal deficit   Assessment:  1. Blister of foot, unspecified laterality, initial encounter   2. Need for influenza vaccination     Plan:  Rx for Bactrim DS bid x 7 days; apply Bactrim to area; follow-up worse, no better; Flu shot given.   No follow-ups on file.  Orders Placed This Encounter  Procedures  . Flu vaccine HIGH DOSE PF    Requested Prescriptions   Signed Prescriptions Disp Refills  . sulfamethoxazole-trimethoprim (BACTRIM DS,SEPTRA DS) 800-160 MG tablet 14 tablet 0    Sig: Take 1 tablet by mouth 2 (two) times daily.  . mupirocin ointment (BACTROBAN) 2 % 30 g 0    Sig: Apply bid to affected area

## 2018-04-06 ENCOUNTER — Other Ambulatory Visit: Payer: Self-pay | Admitting: Family

## 2018-04-09 ENCOUNTER — Other Ambulatory Visit: Payer: Self-pay | Admitting: Family

## 2018-04-11 ENCOUNTER — Telehealth: Payer: Self-pay | Admitting: Family

## 2018-04-11 NOTE — Telephone Encounter (Signed)
Pt called in and stated that the antibiotic was helping and the ointment  but his not out of both of them.  He stated that it is better but not completely gone .  Pharmacy -  [facility] [address] ([address]), [place] - [address] [phone] (Phone)    Best number  [phone]

## 2018-04-12 NOTE — Telephone Encounter (Signed)
It would be best to have his feet re-checked. Please schedule appointment.

## 2018-04-13 ENCOUNTER — Encounter: Payer: Self-pay | Admitting: Internal Medicine

## 2018-04-13 ENCOUNTER — Ambulatory Visit (INDEPENDENT_AMBULATORY_CARE_PROVIDER_SITE_OTHER): Payer: Medicare Other | Admitting: Internal Medicine

## 2018-04-13 DIAGNOSIS — R21 Rash and other nonspecific skin eruption: Secondary | ICD-10-CM | POA: Diagnosis not present

## 2018-04-13 MED ORDER — TRIAMCINOLONE ACETONIDE 0.5 % EX OINT: 1.0000 "application " | TOPICAL_OINTMENT | Freq: Two times a day (BID) | CUTANEOUS | 1 refills | Status: DC

## 2018-04-13 NOTE — Progress Notes (Signed)
   Subjective:    Patient ID: Michael Snyder, male    DOB: [birth year], 67 y.o.   MRN: [id number]  HPI The patient is a 67 YO man coming in for wounds on his feet. Started about 2 weeks or so ago and he denies any triggers. He was worried that it was the shoes and got rid of the pair he was wearing before this happened. They were red and blistering. He was given a course of antibiotics and mupiricin ointment. He has used all of the ointment and taken all the antibiotics. He feels that the blisters have gone down some but still present. Not itching but hurt if he messes with them which he is avoiding. Denies fevers or chills.   Review of Systems  Constitutional: Negative.   Respiratory: Negative for cough, chest tightness and shortness of breath.   Cardiovascular: Negative for chest pain, palpitations and leg swelling.  Gastrointestinal: Negative for abdominal distention, abdominal pain, constipation, diarrhea, nausea and vomiting.  Musculoskeletal: Negative.   Skin: Positive for color change and wound.  Neurological: Negative.   Psychiatric/Behavioral: Negative.       Objective:   Physical Exam  Constitutional: He is oriented to person, place, and time. He appears well-developed and well-nourished.  HENT:  Head: Normocephalic and atraumatic.  Eyes: EOM are normal.  Neck: Normal range of motion.  Cardiovascular: Normal rate and regular rhythm.  Pulmonary/Chest: Effort normal and breath sounds normal. No respiratory distress. He has no wheezes. He has no rales.  Musculoskeletal: He exhibits no edema.  Neurological: He is alert and oriented to person, place, and time. Coordination normal.  Skin: Skin is warm and dry.  Some blistering on the tops of both feet, no underlying erythema, no abscess or purulent drainage   Vitals:   04/13/18 1043  BP: 116/82  Pulse: 81  Temp: 98.3 F (36.8 C)  TempSrc: Oral  SpO2: 99%  Weight: 187 lb (84.8 kg)  Height: 6' (1.829 m)      Assessment &  Plan:

## 2018-04-13 NOTE — Patient Instructions (Signed)
We have sent in a new ointment to use 2-3 times per day on the feet until healed.   Call us if not better in 1-2 weeks.

## 2018-04-15 ENCOUNTER — Encounter: Payer: Self-pay | Admitting: Internal Medicine

## 2018-04-15 NOTE — Assessment & Plan Note (Signed)
Rx for triamcinolone cream to help this go away. Does not appear to need any more antibiotics.

## 2018-04-18 ENCOUNTER — Telehealth: Payer: Self-pay

## 2018-04-18 NOTE — Telephone Encounter (Signed)
Should schedule diabetes follow up.

## 2018-04-18 NOTE — Telephone Encounter (Signed)
Can you please schedule patient an appointment. Thank you  

## 2018-04-18 NOTE — Telephone Encounter (Signed)
Copied from CRM [id number]. Topic: General - Other >> Apr 18, 2018  2:55 PM Angela Nevin wrote: Reason for CRM: Pt called stating that he received an emmi call stating it was time to have his AIC checked-Pt was seen on 10/9- no order in system. >> Apr 18, 2018  3:42 PM Claris Pong wrote: Please advise if patient needs his A1C checked. Last checked in 2018

## 2018-04-19 NOTE — Telephone Encounter (Signed)
appt scheduled

## 2018-04-27 NOTE — Progress Notes (Signed)
Electrophysiology Office Note Date: 04/28/2018  ID:  Michael Snyder, DOB [birth year], MRN [id number]  PCP: Hoyt Koch, MD Electrophysiologist: Caryl Comes  CC: Pacemaker follow-up  Michael Snyder is a 67 y.o. male seen today for Dr Caryl Comes.  He presents today for routine electrophysiology followup.  Since last being seen in our clinic, the patient reports doing very well.  He denies chest pain, palpitations, dyspnea, PND, orthopnea, nausea, vomiting, dizziness, syncope, edema, weight gain, or early satiety.  Device History: MDT dual chamber PPM implanted 2008 for SSS; gen change 2015 with new RV lead (previous RV lead dislodged)   Past Medical History:  Diagnosis Date  . Alcohol abuse   . Arthritis   . Diabetes mellitus, type 2 ([place])   . Head injury, closed, without LOC 1980s  . History of alcohol abuse   . Hypertension   . 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 Pacemaker    [place]  . Seizure disorder ([place])   . Seizures ([place])    last time in 1990's   Past Surgical History:  Procedure Laterality Date  . LACERATION REPAIR  1980   scalp  . PACEMAKER LEAD REMOVAL N/A 03/23/2014   Procedure: PACEMAKER LEAD REMOVAL;  Surgeon: Evans Lance, MD;  Location: [facility];  Service: Cardiovascular;  Laterality: N/A;  . PTDVP     7/08 medtronic DDD    Current Outpatient Medications  Medication Sig Dispense Refill  . ALPHAGAN P 0.1 % SOLN Place 2 drops into the left eye daily.    [name] aspirin EC 81 MG tablet Take 81 mg daily by mouth.    . Blood Glucose Monitoring Suppl (ONETOUCH VERIO IQ SYSTEM) w/Device KIT USE TO CHECK BLOOD SUGARS  TWICE A DAY 1 kit 0  . Calcium Carbonate-Vitamin D (CALTRATE 600+D) 600-400 MG-UNIT per tablet Take 1 tablet by mouth 2 (two) times daily. 180 tablet 3  . gabapentin (NEURONTIN) 300 MG capsule Take 300 mg by mouth every 6 (six) hours as needed (pain).     [name] glipiZIDE (GLUCOTROL) 5 MG tablet TAKE 1 TABLET BY MOUTH  TWICE A DAY BEFORE  MEALS 180 tablet 3  . lisinopril-hydrochlorothiazide (PRINZIDE,ZESTORETIC) 10-12.5 MG tablet TAKE 1 TABLET BY MOUTH  DAILY 90 tablet 3  . Multiple Vitamin (MULTIVITAMIN WITH MINERALS) TABS tablet Take 1 tablet by mouth daily.    . mupirocin ointment (BACTROBAN) 2 % Apply bid to affected area 30 g 0  . Omega-3 Fatty Acids (FISH OIL) 300 MG CAPS Take 1 capsule (300 mg total) by mouth daily. 90 capsule 3  . ONETOUCH DELICA LANCETS 40J MISC CHECK BLOOD SUGAR TWO TIMES DAILY 200 each 11  . ONETOUCH VERIO test strip TEST TWO TIMES DAILY 200 each 3  . pioglitazone (ACTOS) 15 MG tablet TAKE 1 TABLET BY MOUTH  DAILY 90 tablet 1  . potassium chloride SA (K-DUR,KLOR-CON) 20 MEQ tablet TAKE 1 TABLET BY MOUTH TWO  TIMES DAILY 180 tablet 3  . sulfamethoxazole-trimethoprim (BACTRIM DS,SEPTRA DS) 800-160 MG tablet Take 1 tablet by mouth 2 (two) times daily. 14 tablet 0  . triamcinolone ointment (KENALOG) 0.5 % Apply 1 application topically 2 (two) times daily. 100 g 1   No current facility-administered medications for this visit.     Allergies:   Patient has no known allergies.   Social History: Social History   Socioeconomic History  . Marital status: Married    Spouse name: Not on file  .  Number of children: 3  . Years of education: Not on file  . Highest education level: Not on file  Occupational History  . Occupation: Games developer, truck Animator Needs  . Financial resource strain: Not hard at all  . Food insecurity:    Worry: Never true    Inability: Never true  . Transportation needs:    Medical: No    Non-medical: No  Tobacco Use  . Smoking status: Never Smoker  . Smokeless tobacco: Never Used  Substance and Sexual Activity  . Alcohol use: No    Comment: quit in 1990's  . Drug use: No    Comment: quit in 1990's  . Sexual activity: Yes    Partners: Female  Lifestyle  . Physical activity:    Days per week: 4 days    Minutes per session: 50 min  . Stress: Not at all    Relationships  . Social connections:    Talks on phone: More than three times a week    Gets together: More than three times a week    Attends religious service: More than 4 times per year    Active member of club or organization: Yes    Attends meetings of clubs or organizations: More than 4 times per year    Relationship status: Married  . Intimate partner violence:    Fear of current or ex partner: No    Emotionally abused: No    Physically abused: No    Forced sexual activity: No  Other Topics Concern  . Not on file  Social History Narrative   12th grade education. H/O substance abuse- now abstemious and works with other in recovery..Attends NA on a regular basis and serves as a sponsor. Married 18 years-divorced. Married 5 years 2nd marriage.3 daughters previous marriage, 9 grandchildren, 3 great-grands. Work- Merchant navy officer             Family History: Family History  Problem Relation Age of Onset  . Hypertension Mother   . COPD Mother   . Hypertension Brother   . Cancer Brother        leukemia  . Hypertension Brother      Review of Systems: All other systems reviewed and are otherwise negative except as noted above.   Physical Exam: VS:  BP 122/78   Pulse 79   Ht 6' (1.829 m)   Wt 189 lb 3.2 oz (85.8 kg)   SpO2 99%   BMI 25.66 kg/m  , BMI Body mass index is 25.66 kg/m.  GEN- The patient is well appearing, alert and oriented x 3 today.   HEENT: normocephalic, atraumatic; sclera clear, conjunctiva pink; hearing intact; oropharynx clear; neck supple  Lungs- Clear to ausculation bilaterally, normal work of breathing.  No wheezes, rales, rhonchi Heart- Regular rate and rhythm, no murmurs, rubs or gallops  GI- soft, non-tender, non-distended, bowel sounds present  Extremities- no clubbing, cyanosis, or edema  MS- no significant deformity or atrophy Skin- warm and dry, no rash or lesion; PPM pocket well healed Psych- euthymic mood, full affect Neuro-  strength and sensation are intact  PPM Interrogation- reviewed in detail today,  See PACEART report  EKG:  EKG is not ordered today.  Recent Labs: 06/10/2017: TSH 2.36 11/17/2017: ALT 19; BUN 20; Creatinine 1.6; Hemoglobin 13.2; Platelets 189; Potassium 4.1; Sodium 139   Wt Readings from Last 3 Encounters:  04/28/18 189 lb 3.2 oz (85.8 kg)  04/13/18 187 lb (84.8 kg)  03/29/18 183 lb 0.6 oz (83 kg)     Other studies Reviewed: Additional studies/ records that were reviewed today include: Dr Olin Pia office notes   Assessment and Plan:  1.  Symptomatic sinus bradycardia Normal PPM function See Pace Art report No changes today  2.  Paroxysmal atrial fibrillation Burden by device interrogation <1%, no true AF seen since last interrogation [place] has been recommended in the past. He has chosen to take ASA  3.  NSVT No recurrence  4.  HTN Stable No change required today   Current medicines are reviewed at length with the patient today.   The patient does not have concerns regarding his medicines.  The following changes were made today:  none  Labs/ tests ordered today include: none Orders Placed This Encounter  Procedures  . CUP PACEART INCLINIC DEVICE CHECK     Disposition:   Follow up with [facility], Dr Caryl Comes 1 year     Signed, Chanetta Marshall, NP 04/28/2018 12:02 PM  [address] [place] [address] [phone] (office) [phone] (fax)

## 2018-04-28 ENCOUNTER — Encounter: Payer: Self-pay | Admitting: Nurse Practitioner

## 2018-04-28 ENCOUNTER — Ambulatory Visit: Payer: Medicare Other | Admitting: Nurse Practitioner

## 2018-04-28 VITALS — BP 122/78 | HR 79 | Ht 72.0 in | Wt 189.2 lb

## 2018-04-28 DIAGNOSIS — I4729 Other ventricular tachycardia: Secondary | ICD-10-CM

## 2018-04-28 DIAGNOSIS — I480 Paroxysmal atrial fibrillation: Secondary | ICD-10-CM

## 2018-04-28 DIAGNOSIS — I495 Sick sinus syndrome: Secondary | ICD-10-CM

## 2018-04-28 DIAGNOSIS — I472 Ventricular tachycardia: Secondary | ICD-10-CM

## 2018-04-28 DIAGNOSIS — I10 Essential (primary) hypertension: Secondary | ICD-10-CM

## 2018-04-28 LAB — CUP PACEART INCLINIC DEVICE CHECK
Date Time Interrogation Session: 20191024113718
Implantable Lead Implant Date: 20150918
Implantable Lead Location: 753860
MDC IDC LEAD IMPLANT DT: 20080711
MDC IDC LEAD LOCATION: 753859
MDC IDC PG IMPLANT DT: 20150918

## 2018-04-28 NOTE — Patient Instructions (Signed)
Medication Instructions:  none If you need a refill on your cardiac medications before your next appointment, please call your pharmacy.   Lab work: none If you have labs (blood work) drawn today and your tests are completely normal, you will receive your results only by: [name] MyChart Message (if you have MyChart) OR . A paper copy in the mail If you have any lab test that is abnormal or we need to change your treatment, we will call you to review the results.  Testing/Procedures: none  Follow-Up: Remote monitoring is used to monitor your Pacemaker  from home. This monitoring reduces the number of office visits required to check your device to one time per year. It allows [place] to keep an eye on the functioning of your device to ensure it is working properly. You are scheduled for a device check from home on 07/28/2018. You may send your transmission at any time that day. If you have a wireless device, the transmission will be sent automatically. After your physician reviews your transmission, you will receive a postcard with your next transmission date.   At [facility], you and your health needs are our priority.  As part of our continuing mission to provide you with exceptional heart care, we have created designated Provider Care Teams.  These Care Teams include your primary Cardiologist (physician) and Advanced Practice Providers (APPs -  Physician Assistants and Nurse Practitioners) who all work together to provide you with the care you need, when you need it. You will need a follow up appointment in 1 years.  Please call our office 2 months in advance to schedule this appointment.  You may see Dr Graciela Husbands or one of the following Advanced Practice Providers on your designated Care Team:   Gypsy Balsam, NP . Francis Dowse, PA-C  Any Other Special Instructions Will Be Listed Below (If Applicable).

## 2018-05-02 ENCOUNTER — Encounter: Payer: Self-pay | Admitting: Internal Medicine

## 2018-05-02 ENCOUNTER — Other Ambulatory Visit (INDEPENDENT_AMBULATORY_CARE_PROVIDER_SITE_OTHER): Payer: Medicare Other

## 2018-05-02 ENCOUNTER — Ambulatory Visit (INDEPENDENT_AMBULATORY_CARE_PROVIDER_SITE_OTHER): Payer: Medicare Other | Admitting: Internal Medicine

## 2018-05-02 VITALS — BP 110/78 | HR 79 | Temp 98.2°F | Ht 72.0 in | Wt 186.0 lb

## 2018-05-02 DIAGNOSIS — Z23 Encounter for immunization: Secondary | ICD-10-CM

## 2018-05-02 DIAGNOSIS — E119 Type 2 diabetes mellitus without complications: Secondary | ICD-10-CM

## 2018-05-02 DIAGNOSIS — Z0000 Encounter for general adult medical examination without abnormal findings: Secondary | ICD-10-CM | POA: Diagnosis not present

## 2018-05-02 DIAGNOSIS — Z8619 Personal history of other infectious and parasitic diseases: Secondary | ICD-10-CM | POA: Diagnosis not present

## 2018-05-02 DIAGNOSIS — I10 Essential (primary) hypertension: Secondary | ICD-10-CM

## 2018-05-02 LAB — LIPID PANEL
CHOLESTEROL: 154 mg/dL (ref 0–200)
HDL: 50.4 mg/dL (ref >39.00)
LDL CALC: 85 mg/dL (ref 0–99)
NonHDL: 103.45
TRIGLYCERIDES: 91 mg/dL (ref 0.0–149.0)
Total CHOL/HDL Ratio: 3
VLDL: 18.2 mg/dL (ref 0.0–40.0)

## 2018-05-02 LAB — COMPREHENSIVE METABOLIC PANEL
ALT: 18 U/L (ref 0–53)
AST: 25 U/L (ref 0–37)
Albumin: 4.3 g/dL (ref 3.5–5.2)
Alkaline Phosphatase: 73 U/L (ref 39–117)
BILIRUBIN TOTAL: 0.6 mg/dL (ref 0.2–1.2)
BUN: 21 mg/dL (ref 6–23)
CO2: 21 meq/L (ref 19–32)
Calcium: 9 mg/dL (ref 8.4–10.5)
Chloride: 105 mEq/L (ref 96–112)
Creatinine, Ser: 1.67 mg/dL — ABNORMAL HIGH (ref 0.40–1.50)
GFR: 52.78 mL/min — ABNORMAL LOW (ref >60.00)
GLUCOSE: 255 mg/dL — AB (ref 70–99)
Potassium: 3.8 mEq/L (ref 3.5–5.1)
Sodium: 135 mEq/L (ref 135–145)
TOTAL PROTEIN: 7 g/dL (ref 6.0–8.3)

## 2018-05-02 LAB — HEMOGLOBIN A1C: Hgb A1c MFr Bld: 8.8 % — ABNORMAL HIGH (ref 4.6–6.5)

## 2018-05-02 LAB — MICROALBUMIN / CREATININE URINE RATIO
CREATININE, U: 39.2 mg/dL
MICROALB/CREAT RATIO: 1.8 mg/g (ref 0.0–30.0)
Microalb, Ur: <0.7 mg/dL (ref 0.0–1.9)

## 2018-05-02 LAB — CBC
HCT: 38.1 % — ABNORMAL LOW (ref 39.0–52.0)
HEMOGLOBIN: 12.6 g/dL — AB (ref 13.0–17.0)
MCHC: 33.1 g/dL (ref 30.0–36.0)
MCV: 89.2 fl (ref 78.0–100.0)
PLATELETS: 145 10*3/uL — AB (ref 150.0–400.0)
RBC: 4.27 Mil/uL (ref 4.22–5.81)
RDW: 13.2 % (ref 11.5–15.5)
WBC: 5.3 10*3/uL (ref 4.0–10.5)

## 2018-05-02 NOTE — Patient Instructions (Signed)

## 2018-05-02 NOTE — Progress Notes (Signed)
   Subjective:    Patient ID: Michael Snyder, male    DOB: 1951-06-23, 67 y.o.   MRN: [id number]  HPI Here for medicare wellness and physical, no new complaints. Please see A/P for status and treatment of chronic medical problems.   Diet DM since diabetic Physical activity: sedentary Depression/mood screen: negative Hearing: intact to whispered voice Visual acuity: grossly normal with lens, performs annual eye exam  ADLs: capable Fall risk: none Home safety: good Cognitive evaluation: intact to orientation, naming, recall and repetition EOL planning: adv directives discussed  I have personally reviewed and have noted 1. The patient's medical and social history - reviewed today no changes 2. Their use of alcohol, tobacco or illicit drugs 3. Their current medications and supplements 4. The patient's functional ability including ADL's, fall risks, home safety risks and hearing or visual impairment. 5. Diet and physical activities 6. Evidence for depression or mood disorders 7. Care team reviewed and updated (available in snapshot)  Review of Systems  Constitutional: Negative.   HENT: Negative.   Eyes: Negative.   Respiratory: Negative for cough, chest tightness and shortness of breath.   Cardiovascular: Negative for chest pain, palpitations and leg swelling.  Gastrointestinal: Negative for abdominal distention, abdominal pain, constipation, diarrhea, nausea and vomiting.  Musculoskeletal: Negative.   Skin: Negative.   Neurological: Negative.   Psychiatric/Behavioral: Negative.       Objective:   Physical Exam  Constitutional: He is oriented to person, place, and time. He appears well-developed and well-nourished.  HENT:  Head: Normocephalic and atraumatic.  Eyes: EOM are normal.  Neck: Normal range of motion.  Cardiovascular: Normal rate and regular rhythm.  Pulmonary/Chest: Effort normal and breath sounds normal. No respiratory distress. He has no wheezes. He has no rales.    Abdominal: Soft. Bowel sounds are normal. He exhibits no distension. There is no tenderness. There is no rebound.  Musculoskeletal: He exhibits no edema.  Neurological: He is alert and oriented to person, place, and time. Coordination normal.  Skin: Skin is warm and dry.  Foot exam done  Psychiatric: He has a normal mood and affect.   Vitals:   05/02/18 1609  BP: 110/78  Pulse: 79  Temp: 98.2 F (36.8 C)  TempSrc: Oral  SpO2: 99%  Weight: 186 lb (84.4 kg)  Height: 6' (1.829 m)      Assessment & Plan:  Tdap given at visit

## 2018-05-03 ENCOUNTER — Other Ambulatory Visit: Payer: Self-pay | Admitting: *Deleted

## 2018-05-03 ENCOUNTER — Other Ambulatory Visit: Payer: Self-pay | Admitting: Internal Medicine

## 2018-05-03 MED ORDER — PIOGLITAZONE HCL 30 MG PO TABS: 30.0000 mg | ORAL_TABLET | Freq: Every day | ORAL | 3 refills | Status: DC

## 2018-05-03 NOTE — Assessment & Plan Note (Signed)
Foot exam done, checking HgA1c and lipid panel today. Taking glipizide and actos currently. Taking lisinopril but not on statin currently. Adjust as needed.

## 2018-05-03 NOTE — Assessment & Plan Note (Signed)
Flu shot up to date. Pneumonia complete. Shingrix counseled, he thinks he got. Tetanus given. Colonoscopy up to date. Counseled about sun safety and mole surveillance. Counseled about the dangers of distracted driving. Given 10 year screening recommendations.

## 2018-05-03 NOTE — Assessment & Plan Note (Signed)
BP at goal on lisinopril/hctz 10/12.5 mg daily. Checking CMP and adjust as needed.  

## 2018-05-03 NOTE — Assessment & Plan Note (Signed)
Last known level undetected post treatment. Treatment done in 2017.

## 2018-05-06 ENCOUNTER — Telehealth: Payer: Self-pay | Admitting: Internal Medicine

## 2018-05-06 NOTE — Telephone Encounter (Signed)
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 Placed in Dr.Klein doc box.

## 2018-05-11 ENCOUNTER — Ambulatory Visit: Payer: Medicare Other | Admitting: Nurse Practitioner

## 2018-05-11 ENCOUNTER — Encounter: Payer: Self-pay | Admitting: Nurse Practitioner

## 2018-05-11 ENCOUNTER — Ambulatory Visit: Payer: Self-pay

## 2018-05-11 VITALS — BP 140/98 | HR 81 | Temp 98.1°F | Ht 72.0 in | Wt 184.0 lb

## 2018-05-11 DIAGNOSIS — R220 Localized swelling, mass and lump, head: Secondary | ICD-10-CM | POA: Diagnosis not present

## 2018-05-11 DIAGNOSIS — I10 Essential (primary) hypertension: Secondary | ICD-10-CM | POA: Diagnosis not present

## 2018-05-11 DIAGNOSIS — E119 Type 2 diabetes mellitus without complications: Secondary | ICD-10-CM | POA: Diagnosis not present

## 2018-05-11 MED ORDER — AMLODIPINE BESYLATE 5 MG PO TABS: 5.0000 mg | ORAL_TABLET | Freq: Every day | ORAL | 0 refills | Status: DC

## 2018-05-11 MED ORDER — HYDROCHLOROTHIAZIDE 12.5 MG PO CAPS: 12.5000 mg | ORAL_CAPSULE | Freq: Every day | ORAL | 0 refills | Status: DC

## 2018-05-11 NOTE — Telephone Encounter (Signed)
Phone call rec'd from pt. With report of possible reaction to his Actos.  Reported he was advised to increase his Actos on 10/29  Stated has been using up his 15 mg. Tablets, and is taking 2 tablets BID.  Noted on lab note on 10/29, it was recommended to take Actos 30 mg daily.  Made pt. aware he is taking twice the amt. He should be taking.  Reported fasting blood sugar 117 yesterday, and 123 today. Reported he started having swelling of his jaw, after taking an antibiotic for a dental extraction, recently. Was advised by his dentist that he should stop the antibiotic.  (the pt. is not at home, and does not know the name of the antibiotic.)  Reported, presently, he has swelling of his jaw that alternates from right side to left side, and of his lower lip; noted that swelling has worsened after starting the Actos.  Stated he has had feeling that his "throat is closing in at times."  Denied any resp. distress at this time.  Able to clearly describe his symptoms.  No distress noted with speaking.  Stated he is driving at present.  Advised he needs to be checked today, but no appt. avail at PCP office.  Called [facility] at [facility]; spoke with [place], and was advised okay to add on to C. Nche's sched. this afternoon; appt. sched. @ 4:00 PM.  Pt. advised to go straight to the hospital ER, if symptoms worsen.  Verb. Understanding.              Reason for Disposition . SEVERE swelling of the entire face  Answer Assessment - Initial Assessment Questions 1. ONSET: "When did the swelling start?" (e.g., minutes, hours, days)     Swelling of the jaws and lips  2. LOCATION: "What part of the face is swollen?"  lower lip and bilateral jaw; throat felt like it was closing up    3. SEVERITY: "How swollen is it?"     mild 4. ITCHING: "Is there any itching?" If so, ask: "How much?"   (Scale 1-10; mild, moderate or severe)     Denied 5. PAIN: "Is the swelling painful to touch?" If so, ask: "How painful is it?"    (Scale 1-10; mild, moderate or severe)     denied 6. FEVER: "Do you have a fever?" If so, ask: "What is it, how was it measured, and when did it start?"      Denied 7. CAUSE: "What do you think is causing the face swelling?"     Increased Actos to 30 mg BID 8. RECURRENT SYMPTOM: "Have you had face swelling before?" If so, ask: "When was the last time?" "What happened that time?"     No  9. OTHER SYMPTOMS: "Do you have any other symptoms?" (e.g., toothache, leg swelling)     Had a broken tooth; tooth removed; opposite side of mouth swelled; placed on antibiotic; this was stopped  Protocols used: [facility]

## 2018-05-11 NOTE — Telephone Encounter (Signed)
Noted. Thanks.

## 2018-05-11 NOTE — Patient Instructions (Addendum)
Facial swelling is possibly related to angioedema? Do not think it is related to actos, since it started prior to increase dose of actos and prior to oral abx prescription.  Current dose to actos is 30mg  once a day.  Hold lisinopril/HCTZ Hold oral abx prescribed by dentist.  Start amlodipine 5mg  once a day and HCTZ 12.5mg  once a day.  F/up with pcp on Friday.  Ok to use benadryl 50mg  tonight, then 25mg  every 8hrs as needed.

## 2018-05-11 NOTE — Progress Notes (Signed)
Subjective:  Patient ID: Michael Snyder, male    DOB: [birth year]  Age: 67 y.o. MRN: [id number]  CC: Facial Swelling (patient is complaining jaw line and lips swelling since he started 05/02/18. this happened before when he was taking abx for his tooth extraction 04/25/18.  )  HPI  Michael Snyder present with left facial swelling x2weeks, waxing and waning, onset of jaw swelling prior to dental appt 04/25/2018, tooth removed on right side. Oral abx prescribed for possible left parotiditis, no imorpvement with oral abx and warm compress. Current use of lisinopril/HCTZ (not new and dose was not changed. Reports taking actos incorrectly (2tabs twice a day instead of 2tabs once a day). He was concerned facial swelling may be related to Actos dose or oral abx prescribed by dentist.   Reviewed past Medical, Social and Family history today.  Outpatient Medications Prior to Visit  Medication Sig Dispense Refill  . ALPHAGAN P 0.1 % SOLN Place 2 drops into the left eye daily.    [name] aspirin EC 81 MG tablet Take 81 mg daily by mouth.    . Blood Glucose Monitoring Suppl (ONETOUCH VERIO IQ SYSTEM) w/Device KIT USE TO CHECK BLOOD SUGARS  TWICE A DAY 1 kit 0  . Calcium Carbonate-Vitamin D (CALTRATE 600+D) 600-400 MG-UNIT per tablet Take 1 tablet by mouth 2 (two) times daily. 180 tablet 3  . gabapentin (NEURONTIN) 300 MG capsule Take 300 mg by mouth every 6 (six) hours as needed (pain).     [name] glipiZIDE (GLUCOTROL) 5 MG tablet TAKE 1 TABLET BY MOUTH  TWICE A DAY BEFORE MEALS 180 tablet 3  . lisinopril-hydrochlorothiazide (PRINZIDE,ZESTORETIC) 10-12.5 MG tablet TAKE 1 TABLET BY MOUTH  DAILY 90 tablet 3  . Multiple Vitamin (MULTIVITAMIN WITH MINERALS) TABS tablet Take 1 tablet by mouth daily.    . mupirocin ointment (BACTROBAN) 2 % Apply bid to affected area 30 g 0  . Omega-3 Fatty Acids (FISH OIL) 300 MG CAPS Take 1 capsule (300 mg total) by mouth daily. 90 capsule 3  . ONETOUCH DELICA LANCETS 40J MISC CHECK BLOOD  SUGAR TWO TIMES DAILY 200 each 11  . ONETOUCH VERIO test strip TEST TWO TIMES DAILY 200 each 3  . pioglitazone (ACTOS) 30 MG tablet Take 1 tablet (30 mg total) by mouth daily. 90 tablet 3  . potassium chloride SA (K-DUR,KLOR-CON) 20 MEQ tablet TAKE 1 TABLET BY MOUTH TWO  TIMES DAILY 180 tablet 3  . triamcinolone ointment (KENALOG) 0.5 % Apply 1 application topically 2 (two) times daily. 100 g 1   No facility-administered medications prior to visit.    ROS Review of Systems  Constitutional: Negative.   HENT: Negative.   Respiratory: Negative.   Cardiovascular: Negative.   Skin: Negative.    Objective:  BP (!) 140/98   Pulse 81   Temp 98.1 F (36.7 C) (Oral)   Ht 6' (1.829 m)   Wt 184 lb (83.5 kg)   SpO2 100%   BMI 24.95 kg/m   BP Readings from Last 3 Encounters:  05/11/18 (!) 140/98  05/02/18 110/78  04/28/18 122/78    Wt Readings from Last 3 Encounters:  05/11/18 184 lb (83.5 kg)  05/02/18 186 lb (84.4 kg)  04/28/18 189 lb 3.2 oz (85.8 kg)    Physical Exam  Constitutional: He is oriented to person, place, and time. He appears well-developed and well-nourished.  HENT:  Head:    Right Ear: External ear normal.  Left Ear: External ear normal.  Mouth/Throat:  Oropharynx is clear and moist and mucous membranes are normal. He does not have dentures. No oral lesions. No trismus in the jaw. No dental abscesses or dental caries. No oropharyngeal exudate, posterior oropharyngeal edema, posterior oropharyngeal erythema or tonsillar abscesses. Tonsils are 0 on the right. Tonsils are 0 on the left.    negative submental lymphadenopathy  Neck: Normal range of motion. Neck supple.  Cardiovascular: Normal rate.  Pulmonary/Chest: Effort normal. No stridor.  Lymphadenopathy:    He has no cervical adenopathy.  Neurological: He is alert and oriented to person, place, and time.  Skin: No rash noted.  Vitals reviewed.   Lab Results  Component Value Date   WBC 5.3 05/02/2018    HGB 12.6 (L) 05/02/2018   HCT 38.1 (L) 05/02/2018   PLT 145.0 (L) 05/02/2018   GLUCOSE 255 (H) 05/02/2018   CHOL 154 05/02/2018   TRIG 91.0 05/02/2018   HDL 50.40 05/02/2018   LDLCALC 85 05/02/2018   ALT 18 05/02/2018   AST 25 05/02/2018   NA 135 05/02/2018   K 3.8 05/02/2018   CL 105 05/02/2018   CREATININE 1.67 (H) 05/02/2018   BUN 21 05/02/2018   CO2 21 05/02/2018   TSH 2.36 06/10/2017   PSA 0.70 05/16/2013   INR 1.1 01/13/2007   HGBA1C 8.8 (H) 05/02/2018   MICROALBUR <0.7 05/02/2018    US Abdomen Complete W/elastography  Result Date: 03/17/2016 CLINICAL DATA:  New diagnosis of hepatitis-C. EXAM: ULTRASOUND ABDOMEN COMPLETE ULTRASOUND HEPATIC ELASTOGRAPHY TECHNIQUE: Sonography of the upper abdomen was performed. In addition, ultrasound elastography evaluation of the liver was performed. A region of interest was placed within the right lobe of the liver. Following application of a compressive sonographic pulse, shear waves were detected in the adjacent hepatic tissue and the shear wave velocity was calculated. Multiple assessments were performed at the selected site. Median shear wave velocity is correlated to a Metavir fibrosis score. COMPARISON:  None. FINDINGS: ULTRASOUND ABDOMEN Gallbladder: Small gallstones are noted in the gallbladder. No wall thickening, pericholecystic fluid or sonographic Murphy sign to suggest acute cholecystitis. Common bile duct: Diameter: 3.7 millimeter Liver: Normal echogenicity without focal lesion or biliary dilatation. IVC: Normal caliber Pancreas: Visualized portion unremarkable. Spleen: Normal size and echogenicity without focal lesions. Right Kidney: Length: 10.8 centimeter. Normal renal cortical thickness and echogenicity. There is a mild prominence in the midportion of the kidney but it appears to contain normal collecting system and vessels and is likely a dromedary hump. Has the same echogenicity as the rest of the renal cortex. Left Kidney: Length:  13.4 centimeter. Normal renal cortical thickness and echogenicity without hydronephrosis. Multiple renal cysts are noted. Abdominal aorta: Normal caliber Other findings: None. ULTRASOUND HEPATIC ELASTOGRAPHY Device: Siemens Helix VTQ Patient position:  Supine Transducer 6 C1 HD Number of measurements:  10 Hepatic Segment:  8 Median velocity:   0.95  m/sec IQR: 0.07 IQR/Median velocity ratio 0.07 Corresponding Metavir fibrosis score:  0/1 Risk of fibrosis: Minimal Limitations of exam: None Pertinent findings noted on other imaging exams:  None Please note that abnormal shear wave velocities may also be identified in clinical settings other than with hepatic fibrosis, such as: acute hepatitis, elevated right heart and central venous pressures including use of beta blockers, veno-occlusive disease (Budd-Chiari), infiltrative processes such as mastocytosis/amyloidosis/infiltrative tumor, extrahepatic cholestasis, in the post-prandial state, and liver transplantation. Correlation with patient history, laboratory data, and clinical condition recommended. IMPRESSION: 1. Cholelithiasis without sonographic findings for acute cholecystitis. 2. 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 Median hepatic shear wave velocity is calculated at 0.95 m/sec. Corresponding Metavir fibrosis score is F0/F1. Risk of fibrosis is minimal. Follow-up:  None required Electronically Signed   By: Marijo Sanes M.D.   On: 03/17/2016 12:26    Assessment & Plan:   Eldin was seen today for facial swelling.  Diagnoses and all orders for this visit:  Left facial swelling  Essential hypertension -     amLODipine (NORVASC) 5 MG tablet; Take 1 tablet (5 mg total) by mouth daily. -     hydrochlorothiazide (MICROZIDE) 12.5 MG capsule; Take 1 capsule (12.5 mg total) by mouth daily.  Controlled type 2 diabetes mellitus without complication, without long-term current use of insulin ([place])   I am having Richardean Chimera start on amLODipine  and hydrochlorothiazide. I am also having him maintain his Calcium Carbonate-Vitamin D, FISH OIL, gabapentin, ALPHAGAN P, multivitamin with minerals, ONETOUCH DELICA LANCETS 51I, potassium chloride SA, glipiZIDE, lisinopril-hydrochlorothiazide, aspirin EC, ONETOUCH VERIO, ONETOUCH VERIO IQ SYSTEM, mupirocin ointment, triamcinolone ointment, and pioglitazone.  Meds ordered this encounter  Medications  . amLODipine (NORVASC) 5 MG tablet    Sig: Take 1 tablet (5 mg total) by mouth daily.    Dispense:  7 tablet    Refill:  0    Order Specific Question:   Supervising Provider    Answer:   Lucille Passy [[id number]]  . hydrochlorothiazide (MICROZIDE) 12.5 MG capsule    Sig: Take 1 capsule (12.5 mg total) by mouth daily.    Dispense:  7 capsule    Refill:  0    Order Specific Question:   Supervising Provider    Answer:   Lucille Passy [[id number]]    Follow-up: Return in about 2 days (around 05/13/2018) for with pcp for facial swelling and HTN.  Wilfred Lacy, NP

## 2018-05-13 ENCOUNTER — Encounter: Payer: Self-pay | Admitting: Nurse Practitioner

## 2018-05-13 ENCOUNTER — Encounter: Payer: Self-pay | Admitting: Internal Medicine

## 2018-05-13 ENCOUNTER — Ambulatory Visit (INDEPENDENT_AMBULATORY_CARE_PROVIDER_SITE_OTHER): Payer: Medicare Other | Admitting: Internal Medicine

## 2018-05-13 DIAGNOSIS — R220 Localized swelling, mass and lump, head: Secondary | ICD-10-CM

## 2018-05-13 NOTE — Progress Notes (Signed)
   Subjective:    Patient ID: Michael Snyder, male    DOB: [birth year], 67 y.o.   MRN: [id number]  HPI The patient is a 67 YO man coming in for follow up of new facial swelling. Seen 2 days ago and asked to stop new antibiotic (clindamycin, from dentist for possible gum infection) and lisinopril. He is having improvement in symptoms, still slight swelling. He was having swelling on the left side of the face and lips with some feeling of blockage with swallowing. He does have a tooth that the dentist wanted to pull on the left which he needed antibiotics prior to pulling due to possible infection. He did not have problems from this. Denies fevers or chills. The swelling did not itch. Overall improving and no lip swelling or swallowing problems now. Slight swelling at the left chin area which is non-tender. Did not take benadryl but did stop the medicines as advised 2 days ago.   Review of Systems  Constitutional: Negative.  Negative for activity change, appetite change, chills, fatigue, fever and unexpected weight change.  HENT: Positive for dental problem and facial swelling. Negative for drooling, sinus pressure, sneezing, sore throat, tinnitus, trouble swallowing and voice change.   Eyes: Negative.   Respiratory: Negative for cough, chest tightness, shortness of breath and wheezing.   Cardiovascular: Negative for chest pain, palpitations and leg swelling.  Gastrointestinal: Negative for abdominal distention, abdominal pain, constipation, diarrhea, nausea and vomiting.  Musculoskeletal: Negative.   Skin: Negative.   Neurological: Negative.   Psychiatric/Behavioral: Negative.       Objective:   Physical Exam  Constitutional: He is oriented to person, place, and time. He appears well-developed and well-nourished.  HENT:  Head: Normocephalic and atraumatic.  Slight swelling at the left chin area, non-tender and no rash on the skin or itching. Oropharynx without swelling or redness. Lips without  swelling. Dental area without redness or purulent drainage from the gums bilaterally.   Eyes: EOM are normal.  Neck: Normal range of motion.  Cardiovascular: Normal rate and regular rhythm.  Pulmonary/Chest: Effort normal and breath sounds normal. No respiratory distress. He has no wheezes. He has no rales.  Abdominal: Soft. Bowel sounds are normal. He exhibits no distension. There is no tenderness. There is no rebound.  Musculoskeletal: He exhibits no edema.  Neurological: He is alert and oriented to person, place, and time. Coordination normal.  Skin: Skin is warm and dry.   Vitals:   05/13/18 0806  BP: 118/88  Pulse: 87  Temp: 98 F (36.7 C)  TempSrc: Oral  SpO2: 99%  Weight: 189 lb (85.7 kg)  Height: 6' (1.829 m)      Assessment & Plan:

## 2018-05-13 NOTE — Patient Instructions (Signed)
We will keep the medicines the same today.   The swelling should be gone within a week. Call us back if the swelling gets more or does not go away.   If you have any problems with breathing or swallowing go to the ER.

## 2018-05-13 NOTE — Assessment & Plan Note (Signed)
Could be related to antibiotic or ACE-I so will add both to allergy list. He is not taking either currently and symptoms are resolving. Asked to monitor symptoms and continue to use benadryl as needed.

## 2018-05-17 ENCOUNTER — Other Ambulatory Visit: Payer: Self-pay | Admitting: Internal Medicine

## 2018-05-17 DIAGNOSIS — I10 Essential (primary) hypertension: Secondary | ICD-10-CM

## 2018-05-17 NOTE — Telephone Encounter (Signed)
Okay to fill, looks like hctz is requested as well

## 2018-05-17 NOTE — Telephone Encounter (Signed)
Copied from CRM [id number]. Topic: Quick Communication - Rx Refill/Question >> May 17, 2018  2:26 PM Gaynelle Adu wrote: Medication: amlodipine 5mg   and HCTZ 12.5mg    Has the patient contacted their pharmacy? yes  Preferred Pharmacy (with phone number or street name): [facility] [address] ([address]), [place] - [address] [phone] (Phone) [phone] (Fax)   Agent: Please be advised that RX refills may take up to 3 business days. We ask that you follow-up with your pharmacy.

## 2018-05-18 MED ORDER — AMLODIPINE BESYLATE 5 MG PO TABS: 5.0000 mg | ORAL_TABLET | Freq: Every day | ORAL | 1 refills | Status: DC

## 2018-05-18 MED ORDER — HYDROCHLOROTHIAZIDE 12.5 MG PO CAPS: 12.5000 mg | ORAL_CAPSULE | Freq: Every day | ORAL | 1 refills | Status: DC

## 2018-05-18 NOTE — Telephone Encounter (Signed)
Rx's Sent  

## 2018-06-08 DIAGNOSIS — H524 Presbyopia: Secondary | ICD-10-CM | POA: Diagnosis not present

## 2018-06-08 DIAGNOSIS — H401211 Low-tension glaucoma, right eye, mild stage: Secondary | ICD-10-CM | POA: Diagnosis not present

## 2018-06-08 DIAGNOSIS — E113293 Type 2 diabetes mellitus with mild nonproliferative diabetic retinopathy without macular edema, bilateral: Secondary | ICD-10-CM | POA: Diagnosis not present

## 2018-06-08 DIAGNOSIS — H35373 Puckering of macula, bilateral: Secondary | ICD-10-CM | POA: Diagnosis not present

## 2018-06-10 ENCOUNTER — Telehealth: Payer: Self-pay | Admitting: Internal Medicine

## 2018-06-10 ENCOUNTER — Other Ambulatory Visit: Payer: Self-pay

## 2018-06-10 NOTE — Patient Outreach (Signed)
[facility] Care Management  06/10/2018  Michael BanasWalter Fizer 01/02/1951 [id number]   Medication Adherence call to Mr. Michael BanasWalter Snyder left a message for patient to call back patient is due on Lisinopril/HCTZ 10/12.5 mg. Michael Snyder is showing past due under [facility] [employer].   Michael AbedAna Snyder CPhT Pharmacy Technician [facility] Care Management Direct Dial [phone]  Fax [phone] [email or web address]

## 2018-06-10 NOTE — Telephone Encounter (Signed)
Copied from CRM [id number]. Topic: General - Other >> Jun 10, 2018  3:48 PM Tamela OddiHarris, Blayre Papania J wrote: Reason for CRM: Patient called to inform the doctor that the new BP medication that she switched him to is also causing swelling in his lips and face.  He stated that it is doing the same thing as the previous medication he was taking.  Patient stated that the doctor told him to let her know if it did not help.  Please advise.  CB# [phone]

## 2018-06-10 NOTE — Telephone Encounter (Signed)
Spoke with patient. He is not having trouble with any of that since he stopped the medication. When the swelling first started he could not swallow. [facility] is being [facility] and if anything gets worse or starts again he will go to ED.

## 2018-06-10 NOTE — Telephone Encounter (Signed)
Please find out if there are any breathing problems, if so please go to ER immediately. If tongue swelling needs to go to ER immediately. If no breathing problems or tongue swelling needs to [facility] as his blood pressure medicine should not typically cause face swelling. Ask him to stop taking amlodipine as this is his newest medication. If he develops any tongue swelling, breathing problems please go to ER immediately or call 911 if he cannot drive.

## 2018-06-11 ENCOUNTER — Ambulatory Visit (INDEPENDENT_AMBULATORY_CARE_PROVIDER_SITE_OTHER): Payer: Medicare Other | Admitting: Family Medicine

## 2018-06-11 ENCOUNTER — Encounter: Payer: Self-pay | Admitting: Family Medicine

## 2018-06-11 DIAGNOSIS — I10 Essential (primary) hypertension: Secondary | ICD-10-CM

## 2018-06-11 DIAGNOSIS — T50905A Adverse effect of unspecified drugs, medicaments and biological substances, initial encounter: Secondary | ICD-10-CM | POA: Insufficient documentation

## 2018-06-11 NOTE — Assessment & Plan Note (Signed)
HCTZ added to allergy list Symptoms have nearly resolved at this time.  Discussed signs/symptoms of allergic reaction and if this re-occurs he should be seen in clinic or ER.

## 2018-06-11 NOTE — Assessment & Plan Note (Signed)
BP fairly well controlled today on amlodipine alone.   Will not anything additional at this time.

## 2018-06-11 NOTE — Progress Notes (Signed)
Michael BanasWalter Snyder - 67 y.o. male MRN [id number]  Date of birth: 06/18/1951  Subjective Chief Complaint  Patient presents with  . Facial Swelling    noticed swelling in his right cheek and lips. Throat was itchy. Stopped taking Microzide on 06/06/18. Has an allergy to Ace inhibitors.     HPI Michael Snyder is 67 y.o. male here today for possible drug reaction.  Recently started on hctz (previously on lisinopril/hctz however had swelling with this as well) and developed swelling in right cheek, lips and tongue.  Had scratchy/itchy throat as well.  This has improved after stopping hctz.  He denies any further swelling, shortness of breath, itchy throat or rash.  He continues to take all of his other medications.   ROS:  A comprehensive ROS was completed and negative except as noted per HPI  Allergies  Allergen Reactions  . Clindamycin/Lincomycin Swelling    Possible facial and lip swelling  . Ace Inhibitors     Facial and lip swelling possibly associated  . Hctz [Hydrochlorothiazide] Swelling    Past Medical History:  Diagnosis Date  . Alcohol abuse   . Arthritis   . Diabetes mellitus, type 2 (HCC)   . Head injury, closed, without LOC 1980s  . History of alcohol abuse   . Hypertension   . 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 Pacemaker    Medtronic Adapta [id number]  . Seizure disorder (HCC)   . Seizures (HCC)    last time in 1990's    Past Surgical History:  Procedure Laterality Date  . LACERATION REPAIR  1980   scalp  . PACEMAKER LEAD REMOVAL N/A 03/23/2014   Procedure: PACEMAKER LEAD REMOVAL;  Surgeon: Marinus MawGregg W Taylor, MD;  [facility];  Service: Cardiovascular;  Laterality: N/A;  . PTDVP     7/08 medtronic DDD    Social History   Socioeconomic History  . Marital status: Married    Spouse name: Not on file  . Number of children: 3  . Years of education: Not on file  . Highest education level: Not on file  Occupational History  . Occupation: Music therapistcarpenter, truck  [employer]  Social Needs  . Financial resource strain: Not hard at all  . Food insecurity:    Worry: Never true    Inability: Never true  . Transportation needs:    Medical: No    Non-medical: No  Tobacco Use  . Smoking status: Never Smoker  . Smokeless tobacco: Never Used  Substance and Sexual Activity  . Alcohol use: No    Comment: quit in 1990's  . Drug use: No    Comment: quit in 1990's  . Sexual activity: Yes    Partners: Female  Lifestyle  . Physical activity:    Days per week: 4 days    Minutes per session: 50 min  . Stress: Not at all  Relationships  . Social connections:    Talks on phone: More than three times a week    Gets together: More than three times a week    Attends religious service: More than 4 times per year    Active member of club or organization: Yes    Attends meetings of clubs or organizations: More than 4 times per year    Relationship status: Married  Other Topics Concern  . Not on file  Social History Narrative   12th grade education. H/O substance abuse- now abstemious and works with other in recovery..Attends NA  on a regular basis and serves as a sponsor. Married 18 years-divorced. Married 5 years 2nd marriage.3 daughters previous marriage, 9 grandchildren, 3 great-grands. Work- Arts administrator             Family History  Problem Relation Age of Onset  . Hypertension Mother   . COPD Mother   . Hypertension Brother   . Cancer Brother        leukemia  . Hypertension Brother     Health Maintenance  Topic Date Due  . OPHTHALMOLOGY EXAM  06/07/2018  . HEMOGLOBIN A1C  11/01/2018  . FOOT EXAM  05/03/2019  . URINE MICROALBUMIN  05/03/2019  . COLONOSCOPY  12/16/2022  . TETANUS/TDAP  05/02/2028  . INFLUENZA VACCINE  Completed  . Hepatitis C Screening  Completed  . PNA vac Low Risk Adult  Completed     ----------------------------------------------------------------------------------------------------------------------------------------------------------------------------------------------------------------- Physical Exam BP 138/74   Pulse 97   Temp 98.2 F (36.8 C) (Oral)   Ht 6' (1.829 m)   Wt 181 lb (82.1 kg)   SpO2 98%   BMI 24.55 kg/m   Physical Exam  Constitutional: He is oriented to person, place, and time. He appears well-nourished. No distress.  HENT:  Head: Normocephalic and atraumatic.  Mouth/Throat: Oropharynx is clear and moist.  Eyes: No scleral icterus.  Neck: Neck supple. No thyromegaly present.  Cardiovascular: Normal rate, regular rhythm and normal heart sounds.  Pulmonary/Chest: Effort normal and breath sounds normal.  Lymphadenopathy:    He has no cervical adenopathy.  Neurological: He is alert and oriented to person, place, and time.  Skin: No rash noted.  Psychiatric: He has a normal mood and affect. His behavior is normal.    ------------------------------------------------------------------------------------------------------------------------------------------------------------------------------------------------------------------- Assessment and Plan  Essential hypertension BP fairly well controlled today on amlodipine alone.   Will not anything additional at this time.   Drug reaction HCTZ added to allergy list Symptoms have nearly resolved at this time.  Discussed signs/symptoms of allergic reaction and if this re-occurs he should be seen in clinic or ER.

## 2018-06-11 NOTE — Patient Instructions (Signed)
Drug Allergy A drug allergy is when the body's disease-fighting system (immune system) reacts badly to a medicine. Drug allergies range from mild to severe, and they can be life-threatening in some cases. Some allergic reactions occur one week or more after you are exposed to a medicine (delayed reaction). A sudden (acute), severe allergic reaction that affects multiple areas of the body is called an anaphylactic reaction (anaphylaxis). Anaphylaxis can be life-threatening. All allergic reactions to a medicine require medical evaluation, even if an allergic reaction appears to be mild (minor). What are the causes? Drug allergies happen when the immune system wrongly identifies a medicine as being harmful. When this happens, the body releases proteins (antibodies) and other compounds, such as histamine, into the bloodstream. This causes swelling in certain tissues and loss of blood pressure to important areas, such as the heart and lungs. Almost any medicine can cause an allergic reaction. Medicines that commonly cause allergic reactions (are common allergens) include:  Penicillin.  Sulfa medicines (sulfonamides).  Medicines that numb certain areas of the body (local anesthetics).  X-ray dyes that contain iodine.  What are the signs or symptoms? Mild Allergic Reaction  Nasal congestion.  Tingling in the mouth.  An itchy, red rash. Severe Allergic Reaction  Swelling of the eyes, lips, face, or tongue.  Swelling of the back of the mouth and the throat.  Wheezing.  A hoarse voice.  Itchy, red, swollen areas of skin (hives).  Dizziness or light-headedness.  Fainting.  Anxiety or confusion.  Abdominal pain.  Difficulty breathing, speaking, or swallowing.  Chest tightness.  Fast or irregular heartbeats (palpitations).  Vomiting.  Diarrhea. How is this diagnosed? This condition is diagnosed from a physical exam and your history of recent exposure to one or more medicines.  You may be referred for follow-up testing by a health care provider who specializes in allergies. This testing can confirm the diagnosis of a drug allergy and determine which medicines you are allergic to. Testing may include:  Skin tests. These may involve: ? Injecting a small amount of the possible allergen between layers of your skin (intradermal injection). ? Applying patches to your skin.  Blood tests.  Drug challenge. For this test, a health care provider gives you a small amount of a medicine in gradual doses while watching for an allergic reaction.  If you are unsure of what caused your allergic reaction, your health care provider may ask you for:  Information about all medicines that you take on a regular basis, including: ? The name of each medicine. ? How much (dosage) and how many times you take each medicine per day. ? The form of each medicine, such as pill, liquid, or injection.  The date and time of your reaction.  How is this treated? There is no cure for allergies. However, an allergic reaction can be treated with:  Medicines that help: ? To reduce pain and swelling (NSAIDs). ? To relieve itching and hives (antihistamines). ? To reduce swelling (corticosteroids).  Respiratory inhalers. These are inhaled medicines that help to widen (dilate) the airways in your lungs.  Injections of medicine that helps to relax the muscles in your airways and tighten your blood vessels (epinephrine).  Severe allergic reactions, such as anaphylaxis, require immediate treatment in a hospital. If you have an anaphylactic reaction, you may need to be hospitalized for observation. You may be prescribed rescue medicines, such as epinephrine. Epinephrine comes in many forms, including what is commonly called an auto-injector "pen" (pre-filled automatic  epinephrine injection device). Follow these instructions at home: If You Have a Severe Allergy  Always keep an auto-injector pen or your  anaphylaxis kit near you. These can be lifesaving if you have a severe reaction. Use your auto-injector pen or anaphylaxis kit as told by your health care provider.  Make sure that you, the members of your household, and your employer know: ? How to use an anaphylaxis kit. ? How to use an auto-injector pen to give you an epinephrine injection.  Replace your epinephrine immediately after you use your auto-injector pen, in case you have another reaction.  Wear a medical alert bracelet or necklace that states your drug allergy, if told by your health care provider. General instructions  Avoidmedicines that you are allergic to.  Take over-the-counter and prescription medicines only as told by your health care provider.  If you were given medicines to treat your reaction, do not drive until your health care provider approves.  If you have hives or a rash: ? Use an over-the-counter antihistamineas told by your health care provider. ? Apply cold, wet cloths (cold compresses) to your skin or take baths or showers in cool water. Avoid hot water.  If you had tests done, it is your responsibility to get your test results. Ask your health care provider when your results will be ready.  Tell any health care providers who care for you that you have a drug allergy.  Keep all follow-up visits as told by your health care provider. This is important. Contact a health care provider if:  You think that you are having an allergic reaction. Symptoms of an allergic reaction usually start within 30 minutes after you are exposed to a medicine.  You have symptoms that last more than 2 days after your reaction.  Your symptoms get worse.  You develop new symptoms. Get help right away if:  You needed to use epinephrine. ? An epinephrine injection helps to manage life-threatening allergic reactions, but you still need to go to the emergency room even if epinephrine seems to work. This is important because  anaphylaxis may happen again within 72 hours (rebound anaphylaxis). ? If you used epinephrine to treat anaphylaxis outside of the hospital, you need additional medical care. This may include more doses of epinephrine.  You develop symptoms of a severe allergic reaction. These symptoms may represent a serious problem that is an emergency. Do not wait to see if the symptoms will go away. Use your auto-injector pen or anaphylaxis kit as you have been instructed, and get medical help right away. Call your local emergency services (911 in the [place] Do not drive yourself to the hospital. This information is not intended to replace advice given to you by your health care provider. Make sure you discuss any questions you have with your health care provider. Document Released: 06/22/2005 Document Revised: 11/01/2015 Document Reviewed: 01/22/2015 Elsevier Interactive Patient Education  [employer].

## 2018-06-20 ENCOUNTER — Other Ambulatory Visit: Payer: Self-pay | Admitting: Internal Medicine

## 2018-09-14 ENCOUNTER — Emergency Department (HOSPITAL_BASED_OUTPATIENT_CLINIC_OR_DEPARTMENT_OTHER)
Admission: EM | Admit: 2018-09-14 | Discharge: 2018-09-14 | Disposition: A | Discharge status: Home / Self Care | Payer: Medicare Other | Attending: Emergency Medicine | Admitting: Emergency Medicine

## 2018-09-14 ENCOUNTER — Encounter (HOSPITAL_BASED_OUTPATIENT_CLINIC_OR_DEPARTMENT_OTHER): Payer: Self-pay

## 2018-09-14 ENCOUNTER — Emergency Department (HOSPITAL_BASED_OUTPATIENT_CLINIC_OR_DEPARTMENT_OTHER): Payer: Medicare Other

## 2018-09-14 ENCOUNTER — Other Ambulatory Visit: Payer: Self-pay

## 2018-09-14 DIAGNOSIS — Z7982 Long term (current) use of aspirin: Secondary | ICD-10-CM | POA: Diagnosis not present

## 2018-09-14 DIAGNOSIS — M7989 Other specified soft tissue disorders: Secondary | ICD-10-CM | POA: Diagnosis not present

## 2018-09-14 DIAGNOSIS — I10 Essential (primary) hypertension: Secondary | ICD-10-CM | POA: Insufficient documentation

## 2018-09-14 DIAGNOSIS — M1990 Unspecified osteoarthritis, unspecified site: Secondary | ICD-10-CM | POA: Diagnosis not present

## 2018-09-14 DIAGNOSIS — E119 Type 2 diabetes mellitus without complications: Secondary | ICD-10-CM | POA: Insufficient documentation

## 2018-09-14 DIAGNOSIS — Z79899 Other long term (current) drug therapy: Secondary | ICD-10-CM | POA: Insufficient documentation

## 2018-09-14 DIAGNOSIS — M25532 Pain in left wrist: Secondary | ICD-10-CM | POA: Diagnosis not present

## 2018-09-14 DIAGNOSIS — M19032 Primary osteoarthritis, left wrist: Secondary | ICD-10-CM

## 2018-09-14 MED ORDER — IBUPROFEN 600 MG PO TABS: 600.0000 mg | ORAL_TABLET | Freq: Three times a day (TID) | ORAL | 0 refills | Status: AC | PRN

## 2018-09-14 MED ORDER — ACETAMINOPHEN 325 MG PO TABS
650.0000 mg | ORAL_TABLET | Freq: Once | ORAL | Status: AC
  Administered 2018-09-14: 650 mg via ORAL
  Filled 2018-09-14: qty 2

## 2018-09-14 NOTE — Discharge Instructions (Signed)
Take Tylenol for pain, use the splint for comfort, follow-up with your primary care doctor if not improving in the next week

## 2018-09-14 NOTE — ED Provider Notes (Signed)
[facility] EMERGENCY DEPARTMENT Provider Note   CSN: [id number] Arrival date & time: 09/14/18  2111    History   Chief Complaint Chief Complaint  Patient presents with  . Wrist Pain    HPI Michael Snyder is a 68 y.o. male.   HPI Patient presents to the emergency room for evaluation of wrist pain.  Patient states that he started having pain in his wrist a couple of days ago.  He does not recall any discrete injury but he works with his hands a lot and often hits them on something.  Patient denies any fevers or chills.  No complaints of swelling elsewhere. Past Medical History:  Diagnosis Date  . Alcohol abuse   . Arthritis   . Diabetes mellitus, type 2 ([place])   . Head injury, closed, without LOC 1980s  . History of alcohol abuse   . Hypertension   . 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 Pacemaker    [place]  . Seizure disorder ([place])   . Seizures ([place])    last time in 1990's    Patient Active Problem List   Diagnosis Date Noted  . Drug reaction 06/11/2018  . Left facial swelling 05/13/2018  . Hepatitis C virus infection cured after antiviral drug therapy 12/24/2015  . Complications, pacemaker cardiac, 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/2014  . Atrial fibrillation ([place]) 11/17/2012  . Cardiac pacemaker MDT 11/26/2011  . Routine health maintenance 06/16/2011  . Essential hypertension 06/01/2008  . Diabetes mellitus type 2, controlled, without complications ([place]) 76/81/1572  . SICK SINUS SYNDROME 03/26/2007  . SEIZURE DISORDER 03/26/2007    Past Surgical History:  Procedure Laterality Date  . LACERATION REPAIR  1980   scalp  . PACEMAKER LEAD REMOVAL N/A 03/23/2014   Procedure: PACEMAKER LEAD REMOVAL;  Surgeon: Evans Lance, MD;  Location: [facility];  Service: Cardiovascular;  Laterality: N/A;  . PTDVP     7/08 medtronic DDD        Home Medications    Prior to Admission medications   Medication Sig Start Date End Date Taking? Authorizing  Provider  ALPHAGAN P 0.1 % SOLN Place 2 drops into the left eye daily. 03/03/14   [provider]  amLODipine (NORVASC) 5 MG tablet Take 1 tablet (5 mg total) by mouth daily. 05/18/18   Hoyt Koch, MD  aspirin EC 81 MG tablet Take 81 mg daily by mouth.    [provider]  Blood Glucose Monitoring Suppl (ONETOUCH VERIO IQ SYSTEM) w/Device KIT USE TO CHECK BLOOD SUGARS  TWICE A DAY 12/30/17   Hoyt Koch, MD  Calcium Carbonate-Vitamin D (CALTRATE 600+D) 600-400 MG-UNIT per tablet Take 1 tablet by mouth 2 (two) times daily. 07/12/13   Norins, Heinz Knuckles, MD  gabapentin (NEURONTIN) 300 MG capsule Take 300 mg by mouth every 6 (six) hours as needed (pain).  07/12/13   Norins, Heinz Knuckles, MD  glipiZIDE (GLUCOTROL) 5 MG tablet TAKE 1 TABLET BY MOUTH  TWICE A DAY BEFORE MEALS 06/20/18   Hoyt Koch, MD  hydrochlorothiazide (MICROZIDE) 12.5 MG capsule Take 1 capsule (12.5 mg total) by mouth daily. Patient not taking: Reported on 06/11/2018 05/18/18   Hoyt Koch, MD  ibuprofen (ADVIL,MOTRIN) 600 MG tablet Take 1 tablet (600 mg total) by mouth every 8 (eight) hours as needed for up to 7 days. 09/14/18 09/21/18  Dorie Rank, MD  Lancets (ONETOUCH DELICA PLUS IOMBTD97C) [place]  06/20/18   Hoyt Koch, MD  Multiple Vitamin (MULTIVITAMIN WITH MINERALS) TABS tablet Take 1 tablet by mouth daily.    [provider]  Omega-3 Fatty Acids (FISH OIL) 300 MG CAPS Take 1 capsule (300 mg total) by mouth daily. 07/12/13   Norins, Heinz Knuckles, MD  [facility] VERIO test strip TEST TWO TIMES DAILY 06/20/18   Hoyt Koch, MD  pioglitazone (ACTOS) 30 MG tablet Take 1 tablet (30 mg total) by mouth daily. 05/03/18   Hoyt Koch, MD  potassium chloride SA (K-DUR,KLOR-CON) 20 MEQ tablet TAKE 1 TABLET BY MOUTH TWO  TIMES DAILY 06/20/18   Hoyt Koch, MD  triamcinolone ointment (KENALOG) 0.5 % Apply 1 application  topically 2 (two) times daily. 04/13/18   Hoyt Koch, MD    Family History Family History  Problem Relation Age of Onset  . Hypertension Mother   . COPD Mother   . Hypertension Brother   . Cancer Brother        leukemia  . Hypertension Brother     Social History Social History   Tobacco Use  . Smoking status: Never Smoker  . Smokeless tobacco: Never Used  Substance Use Topics  . Alcohol use: No  . Drug use: No     Allergies   Clindamycin/lincomycin; Ace inhibitors; and Hctz [hydrochlorothiazide]   Review of Systems Review of Systems  All other systems reviewed and are negative.    Physical Exam Updated Vital Signs BP (!) 158/108 (BP Location: Right Arm)   Pulse 71   Temp 98 F (36.7 C) (Oral)   Resp 14   Ht 1.829 m (6')   Wt 84.4 kg   BMI 25.23 kg/m   Physical Exam Vitals signs and nursing note reviewed.  Constitutional:      General: He is not in acute distress.    Appearance: He is well-developed.  HENT:     Head: Normocephalic and atraumatic.     Right Ear: External ear normal.     Left Ear: External ear normal.  Eyes:     General: No scleral icterus.       Right eye: No discharge.        Left eye: No discharge.     Conjunctiva/sclera: Conjunctivae normal.  Neck:     Musculoskeletal: Neck supple.     Trachea: No tracheal deviation.  Cardiovascular:     Rate and Rhythm: Normal rate.  Pulmonary:     Effort: Pulmonary effort is normal. No respiratory distress.     Breath sounds: No stridor.  Abdominal:     General: There is no distension.  Musculoskeletal:        General: Tenderness present. No swelling or deformity.     Left wrist: He exhibits tenderness, bony tenderness and swelling. He exhibits no deformity and no laceration.     Comments: No erythema, mild edema and tenderness around the wrist, no significant pain with passive range of motion  Skin:    General: Skin is warm and dry.     Findings: No rash.  Neurological:      Mental Status: He is alert.     Cranial Nerves: Cranial nerve deficit: no gross deficits.      ED Treatments / Results  Labs (all labs ordered are listed, but only abnormal results are displayed) Labs Reviewed - No data to display  EKG None  Radiology Dg Wrist Complete Left  Result Date: 09/14/2018 CLINICAL DATA:  68 year old male with  wrist swelling and navicular region pain for 2 days but no known injury. EXAM: LEFT WRIST - COMPLETE 3+ VIEW COMPARISON:  None. FINDINGS: Bone mineralization is within normal limits. There is radiocarpal joint space loss with mild subchondral sclerosis. The carpal bone alignment remains normal. The scaphoid is intact. The remaining carpal bones appear intact and carpal joint spaces are preserved. Metacarpals are intact. Generalized soft tissue swelling. IMPRESSION: Generalized soft tissue swelling with radiocarpal joint osteoarthritis suspected, but no acute osseous abnormality identified. Electronically Signed   By: Genevie Ann M.D.   On: 09/14/2018 21:54    Procedures Procedures (including critical care time)  Medications Ordered in ED Medications  acetaminophen (TYLENOL) tablet 650 mg (has no administration in time range)     Initial Impression / Assessment and Plan / ED Course  I have reviewed the triage vital signs and the nursing notes.  Pertinent labs & imaging results that were available during my care of the patient were reviewed by me and considered in my medical decision making (see chart for details).   Patient's exam is suggestive of osteoarthritis of his wrist.  X-rays show acute arthritis but no signs of any acute bony abnormality.  Plan on discharge home with a splint and Tylenol for pain.  I was going to have him take NSAIDs but looking at his previous labs he has renal insufficiency.  Final Clinical Impressions(s) / ED Diagnoses   Final diagnoses:  Osteoarthritis of left wrist, unspecified osteoarthritis type      Dorie Rank, MD  09/14/18 2221

## 2018-09-14 NOTE — ED Triage Notes (Signed)
Pt c/o pain, swelling to left wrist x 2 days-denies specific states "I work with my hands and hit them all the time"-NAD-steady gait

## 2018-09-23 ENCOUNTER — Ambulatory Visit (INDEPENDENT_AMBULATORY_CARE_PROVIDER_SITE_OTHER): Payer: Medicare Other | Admitting: Internal Medicine

## 2018-09-23 ENCOUNTER — Other Ambulatory Visit: Payer: Self-pay

## 2018-09-23 ENCOUNTER — Encounter: Payer: Self-pay | Admitting: Internal Medicine

## 2018-09-23 DIAGNOSIS — M25532 Pain in left wrist: Secondary | ICD-10-CM

## 2018-09-23 MED ORDER — SULFAMETHOXAZOLE-TRIMETHOPRIM 800-160 MG PO TABS: 1.0000 | ORAL_TABLET | Freq: Two times a day (BID) | ORAL | 0 refills | Status: DC

## 2018-09-23 NOTE — Progress Notes (Signed)
   Subjective:   Patient ID: Michael Snyder, male    DOB: [birth year], 68 y.o.   MRN: [id number]  HPI The patient is a 68 YO man coming in for wrist problem. Started about 1-2 weeks ago. Does not recall specifically hitting his wrist but could have at work. Denies recent outdoor exposure where he would have been exposed to any plants or gardening or weeds or insects. Overall it is worsening. Has been swelling intermittently at the wrist. Denies pain except with moving wrist. Has taken some tylenol which helped temporarily. Denies fevers or chills or facial swelling. Has tylenol which did help but nothing else. Went to ER/urgent care and some soft tissue swelling, x-ray   Review of Systems  Constitutional: Negative.   HENT: Negative.   Eyes: Negative.   Respiratory: Negative for cough, chest tightness and shortness of breath.   Cardiovascular: Negative for chest pain, palpitations and leg swelling.  Gastrointestinal: Negative for abdominal distention, abdominal pain, constipation, diarrhea, nausea and vomiting.  Musculoskeletal: Positive for joint swelling and myalgias.  Skin: Positive for wound.  Neurological: Negative.   Psychiatric/Behavioral: Negative.     Objective:  Physical Exam Constitutional:      Appearance: He is well-developed.  HENT:     Head: Normocephalic and atraumatic.  Neck:     Musculoskeletal: Normal range of motion.  Cardiovascular:     Rate and Rhythm: Normal rate and regular rhythm.  Pulmonary:     Effort: Pulmonary effort is normal. No respiratory distress.     Breath sounds: Normal breath sounds. No wheezing or rales.  Abdominal:     General: Bowel sounds are normal. There is no distension.     Palpations: Abdomen is soft.     Tenderness: There is no abdominal tenderness. There is no rebound.  Musculoskeletal:        General: Tenderness present.     Comments: Tenderness left wrist radial and pain with flexion and extension of the wrist, mild swelling. Some  rash on the wrist area without drainage but some fluctuance  Skin:    General: Skin is warm and dry.  Neurological:     Mental Status: He is alert and oriented to person, place, and time.     Coordination: Coordination normal.     Vitals:   09/23/18 1530  BP: 120/90  Pulse: 80  Temp: 98.4 F (36.9 C)  TempSrc: Oral  SpO2: 98%  Weight: 182 lb (82.6 kg)  Height: 6' (1.829 m)    Assessment & Plan:

## 2018-09-23 NOTE — Patient Instructions (Signed)
We have sent in bactrim to take 1 pill twice a day for 5 days to help the wrist.   Call us back if not better.   You can use ice on it to see if this helps with swelling.

## 2018-09-23 NOTE — Assessment & Plan Note (Addendum)
Concern for infection with soft tissue inflammation. Rx for bactrim 5 day and ice the area. No further imaging required today.

## 2018-10-07 ENCOUNTER — Encounter (HOSPITAL_BASED_OUTPATIENT_CLINIC_OR_DEPARTMENT_OTHER): Payer: Self-pay | Admitting: *Deleted

## 2018-10-07 ENCOUNTER — Emergency Department (HOSPITAL_BASED_OUTPATIENT_CLINIC_OR_DEPARTMENT_OTHER)
Admission: EM | Admit: 2018-10-07 | Discharge: 2018-10-07 | Disposition: A | Discharge status: Home / Self Care | Payer: Medicare Other | Attending: Emergency Medicine | Admitting: Emergency Medicine

## 2018-10-07 ENCOUNTER — Emergency Department (HOSPITAL_BASED_OUTPATIENT_CLINIC_OR_DEPARTMENT_OTHER): Payer: Medicare Other

## 2018-10-07 ENCOUNTER — Other Ambulatory Visit: Payer: Self-pay

## 2018-10-07 DIAGNOSIS — Z79899 Other long term (current) drug therapy: Secondary | ICD-10-CM | POA: Diagnosis not present

## 2018-10-07 DIAGNOSIS — Y929 Unspecified place or not applicable: Secondary | ICD-10-CM | POA: Diagnosis not present

## 2018-10-07 DIAGNOSIS — Z794 Long term (current) use of insulin: Secondary | ICD-10-CM | POA: Insufficient documentation

## 2018-10-07 DIAGNOSIS — Y999 Unspecified external cause status: Secondary | ICD-10-CM | POA: Diagnosis not present

## 2018-10-07 DIAGNOSIS — E119 Type 2 diabetes mellitus without complications: Secondary | ICD-10-CM | POA: Diagnosis not present

## 2018-10-07 DIAGNOSIS — S91341A Puncture wound with foreign body, right foot, initial encounter: Secondary | ICD-10-CM | POA: Insufficient documentation

## 2018-10-07 DIAGNOSIS — S90851A Superficial foreign body, right foot, initial encounter: Secondary | ICD-10-CM

## 2018-10-07 DIAGNOSIS — I10 Essential (primary) hypertension: Secondary | ICD-10-CM | POA: Diagnosis not present

## 2018-10-07 DIAGNOSIS — Z7982 Long term (current) use of aspirin: Secondary | ICD-10-CM | POA: Diagnosis not present

## 2018-10-07 DIAGNOSIS — Y939 Activity, unspecified: Secondary | ICD-10-CM | POA: Diagnosis not present

## 2018-10-07 DIAGNOSIS — W273XXA Contact with needle (sewing), initial encounter: Secondary | ICD-10-CM | POA: Diagnosis not present

## 2018-10-07 MED ORDER — CEPHALEXIN 500 MG PO CAPS: 500.0000 mg | ORAL_CAPSULE | Freq: Three times a day (TID) | ORAL | 0 refills | Status: AC

## 2018-10-07 MED ORDER — CEPHALEXIN 250 MG PO CAPS
500.0000 mg | ORAL_CAPSULE | Freq: Once | ORAL | Status: AC
  Administered 2018-10-07: 21:00:00 500 mg via ORAL
  Filled 2018-10-07: qty 2

## 2018-10-07 NOTE — Discharge Instructions (Signed)
As we discussed, you can soak your foot in warm water to help continue push the object out of the skin.  Take antibiotic as directed.  Eventually, the skin will push out the foreign body and you can pull it out.  If in about a week or so, you are still having pain, follow-up with referred orthopedic doctor for the further evaluation.  Return the emergency department for any fever, redness, warmth of the foot that began spreading up, worsening pain or any other worsening concerning symptoms.

## 2018-10-07 NOTE — ED Provider Notes (Signed)
[place] EMERGENCY DEPARTMENT Provider Note   CSN: [id number] Arrival date & time: 10/07/18  1901    History   Chief Complaint Chief Complaint  Patient presents with  . Foreign Body    HPI Michael Snyder is a 68 y.o. male past medical history of IV drug use, hypertension, diabetes who presents for evaluation of foot pain for the last few days.  Patient states that he thinks he stepped on a sewing needle that was in his shoe.  He states that since then, has had pain to the lateral aspect of the right foot.  Additionally, he feels like there is something in the foot.  States he has not noticed any surrounding warmth, erythema.  He has not had any fevers.  He states he has been compliant with his diabetes medications and states his sugars have been running between 130-140.  His last tetanus was 1 year ago.     The history is provided by the patient.    Past Medical History:  Diagnosis Date  . Alcohol abuse   . Arthritis   . Diabetes mellitus, type 2 ([place])   . Head injury, closed, without LOC 1980s  . History of alcohol abuse   . Hypertension   . 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 Pacemaker    [place]  . Seizure disorder ([place])   . Seizures ([place])    last time in 1990's    Patient Active Problem List   Diagnosis Date Noted  . Left wrist pain 09/23/2018  . Drug reaction 06/11/2018  . Left facial swelling 05/13/2018  . Hepatitis C virus infection cured after antiviral drug therapy 12/24/2015  . Complications, pacemaker cardiac, 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/2014  . Atrial fibrillation ([place]) 11/17/2012  . Cardiac pacemaker MDT 11/26/2011  . Routine health maintenance 06/16/2011  . Essential hypertension 06/01/2008  . Diabetes mellitus type 2, controlled, without complications ([place]) 74/16/3845  . SICK SINUS SYNDROME 03/26/2007  . SEIZURE DISORDER 03/26/2007    Past Surgical History:  Procedure Laterality Date  . LACERATION REPAIR  1980   scalp   . PACEMAKER LEAD REMOVAL N/A 03/23/2014   Procedure: PACEMAKER LEAD REMOVAL;  Surgeon: Evans Lance, MD;  Location: [facility];  Service: Cardiovascular;  Laterality: N/A;  . PTDVP     7/08 medtronic DDD        Home Medications    Prior to Admission medications   Medication Sig Start Date End Date Taking? Authorizing Provider  ALPHAGAN P 0.1 % SOLN Place 2 drops into the left eye daily. 03/03/14  Yes [provider]  amLODipine (NORVASC) 5 MG tablet Take 1 tablet (5 mg total) by mouth daily. 05/18/18  Yes Hoyt Koch, MD  aspirin EC 81 MG tablet Take 81 mg daily by mouth.   Yes [provider]  Blood Glucose Monitoring Suppl (ONETOUCH VERIO IQ SYSTEM) w/Device KIT USE TO CHECK BLOOD SUGARS  TWICE A DAY 12/30/17  Yes Hoyt Koch, MD  Calcium Carbonate-Vitamin D (CALTRATE 600+D) 600-400 MG-UNIT per tablet Take 1 tablet by mouth 2 (two) times daily. 07/12/13  Yes Norins, Heinz Knuckles, MD  gabapentin (NEURONTIN) 300 MG capsule Take 300 mg by mouth every 6 (six) hours as needed (pain).  07/12/13  Yes Norins, Heinz Knuckles, MD  glipiZIDE (GLUCOTROL) 5 MG tablet TAKE 1 TABLET BY MOUTH  TWICE A DAY BEFORE MEALS 06/20/18  Yes Hoyt Koch, MD  hydrochlorothiazide (MICROZIDE) 12.5 MG capsule  Take 1 capsule (12.5 mg total) by mouth daily. 05/18/18  Yes Hoyt Koch, MD  Lancets [facility] DELICA PLUS [id number]) MISC CHECK BLOOD SUGAR TWO TIMES DAILY 06/20/18  Yes Hoyt Koch, MD  Multiple Vitamin (MULTIVITAMIN WITH MINERALS) TABS tablet Take 1 tablet by mouth daily.   Yes [provider]  Omega-3 Fatty Acids (FISH OIL) 300 MG CAPS Take 1 capsule (300 mg total) by mouth daily. 07/12/13  Yes Norins, Heinz Knuckles, MD  [facility] VERIO test strip TEST TWO TIMES DAILY 06/20/18  Yes Hoyt Koch, MD  pioglitazone (ACTOS) 30 MG tablet Take 1 tablet (30 mg total) by mouth daily. 05/03/18  Yes Hoyt Koch, MD  potassium chloride SA  (K-DUR,KLOR-CON) 20 MEQ tablet TAKE 1 TABLET BY MOUTH TWO  TIMES DAILY 06/20/18  Yes Hoyt Koch, MD  triamcinolone ointment (KENALOG) 0.5 % Apply 1 application topically 2 (two) times daily. 04/13/18  Yes Hoyt Koch, MD  cephALEXin (KEFLEX) 500 MG capsule Take 1 capsule (500 mg total) by mouth 3 (three) times daily for 7 days. 10/07/18 10/14/18  Volanda Napoleon, PA-C  sulfamethoxazole-trimethoprim (BACTRIM DS,SEPTRA DS) 800-160 MG tablet Take 1 tablet by mouth 2 (two) times daily. 09/23/18   Hoyt Koch, MD    Family History Family History  Problem Relation Age of Onset  . Hypertension Mother   . COPD Mother   . Hypertension Brother   . Cancer Brother        leukemia  . Hypertension Brother     Social History Social History   Tobacco Use  . Smoking status: Never Smoker  . Smokeless tobacco: Never Used  Substance Use Topics  . Alcohol use: No  . Drug use: No     Allergies   Clindamycin/lincomycin; Ace inhibitors; and Hctz [hydrochlorothiazide]   Review of Systems Review of Systems  Constitutional: Negative for fever.  Skin: Positive for wound. Negative for color change.  All other systems reviewed and are negative.    Physical Exam Updated Vital Signs BP (!) 158/88 (BP Location: Right Arm)   Pulse 84   Temp 97.8 F (36.6 C) (Oral)   Resp 16   Ht 6' (1.829 m)   Wt 82.5 kg   SpO2 98%   BMI 24.67 kg/m   Physical Exam Vitals signs and nursing note reviewed.  Constitutional:      Appearance: He is well-developed.  HENT:     Head: Normocephalic and atraumatic.  Eyes:     General: No scleral icterus.       Right eye: No discharge.        Left eye: No discharge.     Conjunctiva/sclera: Conjunctivae normal.  Cardiovascular:     Pulses:          Dorsalis pedis pulses are 2+ on the right side and 2+ on the left side.  Pulmonary:     Effort: Pulmonary effort is normal.  Skin:    General: Skin is warm and dry.     Comments:  Tenderness palpation noted to the lateral aspect of the right foot with scabbed over wound.  No opening.  No surrounding warmth, erythema.  No purulent drainage.  Neurological:     Mental Status: He is alert.  Psychiatric:        Speech: Speech normal.        Behavior: Behavior normal.      ED Treatments / Results  Labs (all labs ordered are listed, but only abnormal  results are displayed) Labs Reviewed - No data to display  EKG None  Radiology Dg Foot Complete Right  Result Date: 10/07/2018 CLINICAL DATA:  Stepped on sewing needle 2 nights ago. EXAM: RIGHT FOOT COMPLETE - 3+ VIEW COMPARISON:  None. FINDINGS: A linear radiodensity is seen in the lateral aspect of the midfoot, located just proximal to the base of the fifth metatarsal. This is located deep within the bottom of the foot based on the lateral view. Vascular calcifications are noted. Bones are unremarkable. IMPRESSION: A foreign body is identified in the soft tissues along the bottom of the foot laterally. Electronically Signed   By: Dorise Bullion III M.D   On: 10/07/2018 20:11    Procedures Procedures (including critical care time)  Medications Ordered in ED Medications  cephALEXin (KEFLEX) capsule 500 mg (500 mg Oral Given 10/07/18 2100)     Initial Impression / Assessment and Plan / ED Course  I have reviewed the triage vital signs and the nursing notes.  Pertinent labs & imaging results that were available during my care of the patient were reviewed by me and considered in my medical decision making (see chart for details).        68 year old male who presents for evaluation of right foot pain times a few days.  Believes he stepped on a sewing needle and part of it got lodged foot.  He states since then has had som erythema.  No fevers. Patient is afebrile, non-toxic appearing, sitting comfortably on examination table. Vital signs reviewed and stable.  On exam, he does have a small scabbed over wound.  No  opening.  No surrounding warmth, erythema.  Check x-ray.  X-ray shows foreign body identified in the soft tissues along the bottom of the foot laterally.  Discussed results with patient.  On evaluation of wound, there is no opening.  No visualized foreign body. No indication to reopen wound to attempt to find foreign body. Patient agreeable.  We will plan to give antibiotics, given diabetes status.  Additionally, will plan therapeutic follow-up. At this time, patient exhibits no emergent life-threatening condition that require further evaluation in ED or admission. Patient had ample opportunity for questions and discussion. All patient's questions were answered with full understanding. Strict return precautions discussed. Patient expresses understanding and agreement to plan.   Portions of this note were generated with [employer]. Dictation errors may occur despite best attempts at proofreading.  Final Clinical Impressions(s) / ED Diagnoses   Final diagnoses:  Foreign body in right foot, initial encounter    ED Discharge Orders         Ordered    cephALEXin (KEFLEX) 500 MG capsule  3 times daily     10/07/18 2058           Volanda Napoleon, PA-C 10/07/18 2215    Malvin Johns, MD 10/07/18 2230

## 2018-10-07 NOTE — ED Notes (Signed)
Patient transported to X-ray 

## 2018-10-07 NOTE — ED Triage Notes (Signed)
He stepped on a sewing needle a few days ago. He was feel it in the bottom of his right foot.

## 2018-10-29 ENCOUNTER — Other Ambulatory Visit: Payer: Self-pay | Admitting: Internal Medicine

## 2018-11-08 ENCOUNTER — Telehealth: Payer: Self-pay

## 2018-11-08 NOTE — Telephone Encounter (Signed)
Appointment scheduled.

## 2018-11-08 NOTE — Telephone Encounter (Signed)
I would recommend visit with sports medicine to assess. I do not believe we talked about MRI for the wrist.

## 2018-11-08 NOTE — Telephone Encounter (Signed)
Copied from CRM [id number]. Topic: Quick Communication - See Telephone Encounter >> Nov 08, 2018 11:03 AM Aretta Nip wrote: CRM for notification. See Telephone encounter for: 11/08/18. Pt has been HAVING left wrist pain ref 3/20 has been discussing with DR Okey Dupre and the pain is not any better. It was discussed that the next step would be an MRI. He now wants that done. If she can set that up and someone can contact him with the specfics, this is his request/

## 2018-11-08 NOTE — Telephone Encounter (Signed)
Can you get patient an appointment with sports medicine. Thank you

## 2018-11-18 ENCOUNTER — Other Ambulatory Visit (INDEPENDENT_AMBULATORY_CARE_PROVIDER_SITE_OTHER): Payer: Medicare Other

## 2018-11-18 ENCOUNTER — Encounter: Payer: Self-pay | Admitting: Family Medicine

## 2018-11-18 ENCOUNTER — Other Ambulatory Visit: Payer: Self-pay

## 2018-11-18 ENCOUNTER — Ambulatory Visit (INDEPENDENT_AMBULATORY_CARE_PROVIDER_SITE_OTHER): Payer: Medicare Other | Admitting: Family Medicine

## 2018-11-18 ENCOUNTER — Ambulatory Visit: Payer: Self-pay

## 2018-11-18 VITALS — BP 132/88 | HR 89 | Resp 16 | Ht 72.0 in | Wt 173.0 lb

## 2018-11-18 DIAGNOSIS — M25532 Pain in left wrist: Secondary | ICD-10-CM

## 2018-11-18 LAB — URIC ACID: Uric Acid, Serum: 3.5 mg/dL — ABNORMAL LOW (ref 4.0–7.8)

## 2018-11-18 MED ORDER — PREDNISONE 20 MG PO TABS: ORAL_TABLET | ORAL | 0 refills | Status: DC

## 2018-11-18 NOTE — Progress Notes (Signed)
Michael BanasWalter Snyder - 68 y.o. male MRN [id number]  Date of birth: 11/25/1950  SUBJECTIVE:  Including CC & ROS.  No chief complaint on file.   Michael BanasWalter Snyder is a 68 y.o. male that is presenting with left wrist pain and swelling.  This been ongoing for about a week.  He was seen in March with similar symptoms.  At that time he was treated with Bactrim and he reports significant improvement of his symptoms.  Today the pain is occurring over the distal radius and scaphoid.  He denies inciting event or trauma.  He is having swelling that is encompassing the dorsal component of his hand and fingers. Has pain if anything touches the distal radius.  No fevers or chills.  Has not take anything for the pain..  Independent review of the left wrist x-ray from 3/11 shows no significant degenerative changes.   Review of Systems  Constitutional: Negative for fever.  HENT: Negative for congestion.   Respiratory: Negative for cough.   Cardiovascular: Negative for chest pain.  Gastrointestinal: Negative for abdominal pain.  Musculoskeletal: Positive for joint swelling.  Skin: Positive for color change.  Neurological: Negative for weakness.  Hematological: Negative for adenopathy.    HISTORY: Past Medical, Surgical, Social, and Family History Reviewed & Updated per EMR.   Pertinent Historical Findings include:  Past Medical History:  Diagnosis Date  . Alcohol abuse   . Arthritis   . Diabetes mellitus, type 2 ([facility])   . Head injury, closed, without LOC 1980s  . History of alcohol abuse   . Hypertension   . 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 Pacemaker    Medtronic Adapta [id number]  . Seizure disorder ([facility])   . Seizures ([facility])    last time in 1990's    Past Surgical History:  Procedure Laterality Date  . LACERATION REPAIR  1980   scalp  . PACEMAKER LEAD REMOVAL N/A 03/23/2014   Procedure: PACEMAKER LEAD REMOVAL;  Surgeon: Marinus MawGregg W Taylor, MD;  Location: [facility] OR;  Service: Cardiovascular;   Laterality: N/A;  . PTDVP     7/08 medtronic DDD    Allergies  Allergen Reactions  . Clindamycin/Lincomycin Swelling    Possible facial and lip swelling  . Ace Inhibitors     Facial and lip swelling possibly associated  . Hctz [Hydrochlorothiazide] Swelling    Family History  Problem Relation Age of Onset  . Hypertension Mother   . COPD Mother   . Hypertension Brother   . Cancer Brother        leukemia  . Hypertension Brother      Social History   Socioeconomic History  . Marital status: Married    Spouse name: Not on file  . Number of children: 3  . Years of education: Not on file  . Highest education level: Not on file  Occupational History  . Occupation: Music therapistcarpenter, truck Public librariandriver  Social Needs  . Financial resource strain: Not hard at all  . Food insecurity:    Worry: Never true    Inability: Never true  . Transportation needs:    Medical: No    Non-medical: No  Tobacco Use  . Smoking status: Never Smoker  . Smokeless tobacco: Never Used  Substance and Sexual Activity  . Alcohol use: No  . Drug use: No  . Sexual activity: Not on file  Lifestyle  . Physical activity:    Days per week: 4 days    Minutes per  session: 50 min  . Stress: Not at all  Relationships  . Social connections:    Talks on phone: More than three times a week    Gets together: More than three times a week    Attends religious service: More than 4 times per year    Active member of club or organization: Yes    Attends meetings of clubs or organizations: More than 4 times per year    Relationship status: Married  . Intimate partner violence:    Fear of current or ex partner: No    Emotionally abused: No    Physically abused: No    Forced sexual activity: No  Other Topics Concern  . Not on file  Social History Narrative   12th grade education. H/O substance abuse- now abstemious and works with other in recovery..Attends NA on a regular basis and serves as a sponsor. Married 18  years-divorced. Married 5 years 2nd marriage.3 daughters previous marriage, 9 grandchildren, 3 great-grands. Work- Arts administrator              PHYSICAL EXAM:  VS: BP 132/88   Pulse 89   Resp 16   Ht 6' (1.829 m)   Wt 173 lb (78.5 kg)   SpO2 98%   BMI 23.46 kg/m  Physical Exam Gen: NAD, alert, cooperative with exam, well-appearing ENT: normal lips, normal nasal mucosa,  Eye: normal EOM, normal conjunctiva and lids CV:  no edema, +2 pedal pulses   Resp: no accessory muscle use, non-labored,  Skin: no rashes, no areas of induration  Neuro: normal tone, normal sensation to touch Psych:  normal insight, alert and oriented MSK:  Left wrist and hand:  Having swelling of the wrist and dorsum of the hand and fingers. Has normal range of motion of the wrist and fingers. Tenderness to palpation over the first dorsal compartment. No streaking and no border of the redness. Neurovascular intact  Limited ultrasound: Left wrist:  First dorsal compartment appears to have a tenosynovitis with hyperlucent crystals. The [facility] joint is not involved. Does not appear to be any effusion within the carpal joints. 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  At that time he was improved with Bactrim.  Today it seems this is more related to gout. -Prednisone. -Uric acid. -If no improvement will consider treating for possible cellulitis

## 2018-11-18 NOTE — Assessment & Plan Note (Signed)
Reports that the pain today is similar to the previous pain that he experienced in March.  At that time he was improved with Bactrim.  Today it seems this is more related to gout. -Prednisone. -Uric acid. -If no improvement will consider treating for possible cellulitis

## 2018-11-18 NOTE — Patient Instructions (Signed)
Nice to meet you I will call you with the results from today  Please let me know if you don't get any better. I may need to try antibiotics  Please send me a message in MyChart with any questions or updates.  Please see me back in 2-3 weeks if no better .   --Dr. Jordan Likes

## 2018-11-21 ENCOUNTER — Telehealth: Payer: Self-pay | Admitting: Family Medicine

## 2018-11-21 NOTE — Telephone Encounter (Signed)
Spoke with patient about his symptoms. Had significant improvement with the prednisone on the first day. May need to continue longer course.   Myra Rude, MD [facility] 11/21/2018, 4:17 PM

## 2018-11-23 ENCOUNTER — Other Ambulatory Visit: Payer: Self-pay | Admitting: Family Medicine

## 2018-11-23 DIAGNOSIS — M25532 Pain in left wrist: Secondary | ICD-10-CM

## 2018-11-29 ENCOUNTER — Emergency Department (HOSPITAL_BASED_OUTPATIENT_CLINIC_OR_DEPARTMENT_OTHER)
Admission: EM | Admit: 2018-11-29 | Discharge: 2018-11-29 | Disposition: A | Discharge status: Home / Self Care | Payer: Medicare Other | Attending: Emergency Medicine | Admitting: Emergency Medicine

## 2018-11-29 ENCOUNTER — Ambulatory Visit: Payer: Self-pay

## 2018-11-29 ENCOUNTER — Other Ambulatory Visit: Payer: Self-pay

## 2018-11-29 ENCOUNTER — Encounter (HOSPITAL_BASED_OUTPATIENT_CLINIC_OR_DEPARTMENT_OTHER): Payer: Self-pay | Admitting: *Deleted

## 2018-11-29 DIAGNOSIS — Z7984 Long term (current) use of oral hypoglycemic drugs: Secondary | ICD-10-CM | POA: Diagnosis not present

## 2018-11-29 DIAGNOSIS — I10 Essential (primary) hypertension: Secondary | ICD-10-CM | POA: Diagnosis not present

## 2018-11-29 DIAGNOSIS — Z79899 Other long term (current) drug therapy: Secondary | ICD-10-CM | POA: Insufficient documentation

## 2018-11-29 DIAGNOSIS — Z950 Presence of cardiac pacemaker: Secondary | ICD-10-CM | POA: Diagnosis not present

## 2018-11-29 DIAGNOSIS — R739 Hyperglycemia, unspecified: Secondary | ICD-10-CM

## 2018-11-29 DIAGNOSIS — E1165 Type 2 diabetes mellitus with hyperglycemia: Secondary | ICD-10-CM | POA: Insufficient documentation

## 2018-11-29 LAB — CBC WITH DIFFERENTIAL/PLATELET
Abs Immature Granulocytes: 0.26 10*3/uL — ABNORMAL HIGH (ref 0.00–0.07)
Basophils Absolute: 0 10*3/uL (ref 0.0–0.1)
Basophils Relative: 0 %
Eosinophils Absolute: 0 10*3/uL (ref 0.0–0.5)
Eosinophils Relative: 0 %
HCT: 37.3 % — ABNORMAL LOW (ref 39.0–52.0)
Hemoglobin: 12.1 g/dL — ABNORMAL LOW (ref 13.0–17.0)
Immature Granulocytes: 2 %
Lymphocytes Relative: 10 %
Lymphs Abs: 1.4 10*3/uL (ref 0.7–4.0)
MCH: 29.5 pg (ref 26.0–34.0)
MCHC: 32.4 g/dL (ref 30.0–36.0)
MCV: 91 fL (ref 80.0–100.0)
Monocytes Absolute: 0.6 10*3/uL (ref 0.1–1.0)
Monocytes Relative: 4 %
Neutro Abs: 12.1 10*3/uL — ABNORMAL HIGH (ref 1.7–7.7)
Neutrophils Relative %: 84 %
Platelets: 152 10*3/uL (ref 150–400)
RBC: 4.1 MIL/uL — ABNORMAL LOW (ref 4.22–5.81)
RDW: 13.4 % (ref 11.5–15.5)
WBC: 14.5 10*3/uL — ABNORMAL HIGH (ref 4.0–10.5)
nRBC: 0 % (ref 0.0–0.2)

## 2018-11-29 LAB — URINALYSIS, ROUTINE W REFLEX MICROSCOPIC
Bilirubin Urine: NEGATIVE
Glucose, UA: >=500 mg/dL — AB
Hgb urine dipstick: NEGATIVE
Ketones, ur: NEGATIVE mg/dL
Leukocytes,Ua: NEGATIVE
Nitrite: NEGATIVE
Protein, ur: NEGATIVE mg/dL
Specific Gravity, Urine: 1.01 (ref 1.005–1.030)
pH: 5.5 (ref 5.0–8.0)

## 2018-11-29 LAB — COMPREHENSIVE METABOLIC PANEL
ALT: 27 U/L (ref 0–44)
AST: 32 U/L (ref 15–41)
Albumin: 3.9 g/dL (ref 3.5–5.0)
Alkaline Phosphatase: 94 U/L (ref 38–126)
Anion gap: 12 (ref 5–15)
BUN: 22 mg/dL (ref 8–23)
CO2: 19 mmol/L — ABNORMAL LOW (ref 22–32)
Calcium: 9 mg/dL (ref 8.9–10.3)
Chloride: 101 mmol/L (ref 98–111)
Creatinine, Ser: 1.51 mg/dL — ABNORMAL HIGH (ref 0.61–1.24)
GFR calc Af Amer: 54 mL/min — ABNORMAL LOW (ref >60)
GFR calc non Af Amer: 46 mL/min — ABNORMAL LOW (ref >60)
Glucose, Bld: 500 mg/dL — ABNORMAL HIGH (ref 70–99)
Potassium: 3.8 mmol/L (ref 3.5–5.1)
Sodium: 132 mmol/L — ABNORMAL LOW (ref 135–145)
Total Bilirubin: 0.8 mg/dL (ref 0.3–1.2)
Total Protein: 6.7 g/dL (ref 6.5–8.1)

## 2018-11-29 LAB — CBG MONITORING, ED
Glucose-Capillary: 491 mg/dL — ABNORMAL HIGH (ref 70–99)
Glucose-Capillary: 353 mg/dL — ABNORMAL HIGH (ref 70–99)

## 2018-11-29 LAB — URINALYSIS, MICROSCOPIC (REFLEX)

## 2018-11-29 MED ORDER — SODIUM CHLORIDE 0.9 % IV BOLUS
1000.0000 mL | Freq: Once | INTRAVENOUS | Status: AC
  Administered 2018-11-29: 1000 mL via INTRAVENOUS

## 2018-11-29 NOTE — Telephone Encounter (Signed)
Reported elevated blood sugar of 280 fasting, and "extremely high" at 11:00 AM.  Denied headache, blurred vision, drowsiness/ fatigue, or difficulty with concentration. Stated he started drinking water, and has had 2, 16 oz. Bottles of H20.  Reported he took his diabetic medication this AM as scheduled, and ate a large breakfast.  Voiced concern that the he started on Prednisone per Dr. Jordan Likes, and questioned if that is driving his blood sugar up.  Advised to have someone drive him to the ER, due to very high reading.  Verb. Understanding.       Reason for Disposition . Blood glucose > 500 mg/dL (91.6 mmol/L)    Fasting blood sugar today of 280 @ 6:00 AM.  Checked Blood sugar at 11:00 AM, and the machine read "Extremely high"; reported rechecking it, and got same reading; "extremely high".   Started drinking water; reported he had 2, 16 oz. bottles of H20.  Answer Assessment - Initial Assessment Questions 1. BLOOD GLUCOSE: "What is your blood glucose level?"      289 @ 6:00 AM, reading "extremely high" at 11:00 A (above 600)   2. ONSET: "When did you check the blood glucose?"     See above 3. USUAL RANGE: "What is your glucose level usually?" (e.g., usual fasting morning value, usual evening value)     Fasting usually runs 84-129 4. KETONES: "Do you check for ketones (urine or blood test strips)?" If yes, ask: "What does the test show now?"      *No Answer* 5. TYPE 1 or 2:  "Do you know what type of diabetes you have?"  (e.g., Type 1, Type 2, Gestational; doesn't know)     Type 2  6. INSULIN: "Do you take insulin?" "What type of insulin(s) do you use? What is the mode of delivery? (syringe, pen (e.g., injection or  pump)?"      N/a  7. DIABETES PILLS: "Do you take any pills for your diabetes?" If yes, ask: "Have you missed taking any pills recently?"     No ; took morning meds about 6:00 AM  8. OTHER SYMPTOMS: "Do you have any symptoms?" (e.g., fever, frequent urination, difficulty breathing,  dizziness, weakness, vomiting)     Denied drowsiness, dizziness, difficulty breathing.  9. PREGNANCY: "Is there any chance you are pregnant?" "When was your last menstrual period?"    N/a  Protocols used: DIABETES - HIGH BLOOD SUGAR-A-AH

## 2018-11-29 NOTE — ED Triage Notes (Addendum)
Pt states  2nd week of prednisone  for gout and BS is " high ' today on home machine

## 2018-11-29 NOTE — Telephone Encounter (Signed)
fyi patient went to ED

## 2018-11-29 NOTE — ED Notes (Signed)
ED Provider at bedside. 

## 2018-11-29 NOTE — Discharge Instructions (Addendum)
It was my pleasure taking care of you today!   Continue taking your Actos daily like normal.  For the next 3-5 days in regards to your glipizide, take two tablets in the morning instead of one. Continue your nighttime dose as usual.  If your blood sugar drops below 150, go back to your normal medication schedule.   Call your primary care doctor tomorrow morning to schedule a follow up appointment.   Return to ER for new or worsening symptoms, any additional concerns.

## 2018-11-29 NOTE — ED Notes (Signed)
CBG in triage 491

## 2018-11-29 NOTE — ED Provider Notes (Signed)
[facility] EMERGENCY DEPARTMENT Provider Note   CSN: [id number] Arrival date & time: 11/29/18  1433    History   Chief Complaint Chief Complaint  Patient presents with  . Hyperglycemia    HPI Michael Snyder is a 68 y.o. male.     The history is provided by the patient and medical records. No language interpreter was used.  Hyperglycemia    Michael Snyder is a 68 y.o. male  with a PMH as listed below including diabetes who presents to the Emergency Department complaining of hyperglycemia. Patient states that he is having no symptoms.  He checks his blood sugar every morning.  It is typically under 130.  Today it was in the 220s.  This was very high for him.  He not missed any doses of his medication and states that he has not adhered from his usual diet.  He was working in the yard and continue to think about how high his blood sugar was this morning, so he stopped and checked it 1 more time.  His machine then read "high".  He called his doctor who told him he should go to the emergency department for evaluation.  He states that he is not having any symptoms and feels fine.  Specifically denies any fever, cough, shortness of breath, abdominal pain, nausea or vomiting.  Of note, he was seen by his doctor on May 15 where he was started on prednisone for gout.   Past Medical History:  Diagnosis Date  . Alcohol abuse   . Arthritis   . Diabetes mellitus, type 2 ([place])   . Head injury, closed, without LOC 1980s  . History of alcohol abuse   . Hypertension   . 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 Pacemaker    [place]  . Seizure disorder ([place])   . Seizures ([place])    last time in 1990's    Patient Active Problem List   Diagnosis Date Noted  . Left wrist pain 09/23/2018  . Drug reaction 06/11/2018  . Left facial swelling 05/13/2018  . Hepatitis C virus infection cured after antiviral drug therapy 12/24/2015  . Complications, pacemaker cardiac,  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/2014  . Atrial fibrillation ([place]) 11/17/2012  . Cardiac pacemaker MDT 11/26/2011  . Routine health maintenance 06/16/2011  . Essential hypertension 06/01/2008  . Diabetes mellitus type 2, controlled, without complications ([place]) 01/03/1600  . SICK SINUS SYNDROME 03/26/2007  . SEIZURE DISORDER 03/26/2007    Past Surgical History:  Procedure Laterality Date  . LACERATION REPAIR  1980   scalp  . PACEMAKER LEAD REMOVAL N/A 03/23/2014   Procedure: PACEMAKER LEAD REMOVAL;  Surgeon: Evans Lance, MD;  Location: [facility];  Service: Cardiovascular;  Laterality: N/A;  . PTDVP     7/08 medtronic DDD        Home Medications    Prior to Admission medications   Medication Sig Start Date End Date Taking? Authorizing Provider  ALPHAGAN P 0.1 % SOLN Place 2 drops into the left eye daily. 03/03/14   [provider]  amLODipine (NORVASC) 5 MG tablet Take 1 tablet (5 mg total) by mouth daily. 05/18/18   Hoyt Koch, MD  aspirin EC 81 MG tablet Take 81 mg daily by mouth.    [provider]  Blood Glucose Monitoring Suppl (ONETOUCH VERIO IQ SYSTEM) w/Device KIT USE TO CHECK BLOOD SUGARS  TWICE A DAY 12/30/17   Hoyt Koch, MD  Calcium  Carbonate-Vitamin D (CALTRATE 600+D) 600-400 MG-UNIT per tablet Take 1 tablet by mouth 2 (two) times daily. 07/12/13   Norins, Heinz Knuckles, MD  gabapentin (NEURONTIN) 300 MG capsule Take 300 mg by mouth every 6 (six) hours as needed (pain).  07/12/13   Norins, Heinz Knuckles, MD  glipiZIDE (GLUCOTROL) 5 MG tablet TAKE 1 TABLET BY MOUTH  TWICE A DAY BEFORE MEALS 06/20/18   Hoyt Koch, MD  hydrochlorothiazide (MICROZIDE) 12.5 MG capsule Take 1 capsule (12.5 mg total) by mouth daily. 05/18/18   Hoyt Koch, MD  Lancets (ONETOUCH DELICA PLUS [id number]) MISC CHECK BLOOD SUGAR TWO TIMES DAILY 06/20/18   Hoyt Koch, MD  Multiple Vitamin (MULTIVITAMIN WITH MINERALS) TABS tablet Take 1 tablet by mouth daily.     [provider]  Omega-3 Fatty Acids (FISH OIL) 300 MG CAPS Take 1 capsule (300 mg total) by mouth daily. 07/12/13   Norins, Heinz Knuckles, MD  [facility] VERIO test strip TEST TWO TIMES DAILY 06/20/18   Hoyt Koch, MD  pioglitazone (ACTOS) 30 MG tablet Take 1 tablet (30 mg total) by mouth daily. 05/03/18   Hoyt Koch, MD  potassium chloride SA (K-DUR,KLOR-CON) 20 MEQ tablet TAKE 1 TABLET BY MOUTH TWO  TIMES DAILY 06/20/18   Hoyt Koch, MD  predniSONE (DELTASONE) 20 MG tablet TAKE 2 TABLETS BY MOUTH ONCE DAILY FOR 3 DAYS THEN 1 TAB DAILY FOR 3 DAYS THEN 1/2 TAB DAILY FOR 2 DAYS 11/24/18   Rosemarie Ax, MD  sulfamethoxazole-trimethoprim (BACTRIM DS,SEPTRA DS) 800-160 MG tablet Take 1 tablet by mouth 2 (two) times daily. 09/23/18   Hoyt Koch, MD  triamcinolone ointment (KENALOG) 0.5 % APPLY  OINTMENT TOPICALLY TWICE DAILY 10/31/18   Hoyt Koch, MD    Family History Family History  Problem Relation Age of Onset  . Hypertension Mother   . COPD Mother   . Hypertension Brother   . Cancer Brother        leukemia  . Hypertension Brother     Social History Social History   Tobacco Use  . Smoking status: Never Smoker  . Smokeless tobacco: Never Used  Substance Use Topics  . Alcohol use: No    Comment: quit 20 yrs ago  . Drug use: No     Allergies   Clindamycin/lincomycin; Ace inhibitors; and Hctz [hydrochlorothiazide]   Review of Systems Review of Systems  All other systems reviewed and are negative.    Physical Exam Updated Vital Signs BP (!) 148/90   Pulse 87   Temp 98.1 F (36.7 C)   Resp 17   Ht 6' (1.829 m)   Wt 78.4 kg   SpO2 100%   BMI 23.44 kg/m   Physical Exam Vitals signs and nursing note reviewed.  Constitutional:      General: He is not in acute distress.    Appearance: He is well-developed.  HENT:     Head: Normocephalic and atraumatic.  Neck:     Musculoskeletal: Neck supple.   Cardiovascular:     Rate and Rhythm: Normal rate and regular rhythm.     Heart sounds: Normal heart sounds. No murmur.  Pulmonary:     Effort: Pulmonary effort is normal. No respiratory distress.     Breath sounds: Normal breath sounds.  Abdominal:     General: There is no distension.     Palpations: Abdomen is soft.     Tenderness: There is no abdominal tenderness.  Skin:  General: Skin is warm and dry.  Neurological:     Mental Status: He is alert and oriented to person, place, and time.      ED Treatments / Results  Labs (all labs ordered are listed, but only abnormal results are displayed) Labs Reviewed  COMPREHENSIVE METABOLIC PANEL - Abnormal; Notable for the following components:      Result Value   Sodium 132 (*)    CO2 19 (*)    Glucose, Bld 500 (*)    Creatinine, Ser 1.51 (*)    GFR calc non Af Amer 46 (*)    GFR calc Af Amer 54 (*)    All other components within normal limits  CBC WITH DIFFERENTIAL/PLATELET - Abnormal; Notable for the following components:   WBC 14.5 (*)    RBC 4.10 (*)    Hemoglobin 12.1 (*)    HCT 37.3 (*)    Neutro Abs 12.1 (*)    Abs Immature Granulocytes 0.26 (*)    All other components within normal limits  URINALYSIS, ROUTINE W REFLEX MICROSCOPIC - Abnormal; Notable for the following components:   Glucose, UA >=500 (*)    All other components within normal limits  URINALYSIS, MICROSCOPIC (REFLEX) - Abnormal; Notable for the following components:   Bacteria, UA RARE (*)    All other components within normal limits  CBG MONITORING, ED - Abnormal; Notable for the following components:   Glucose-Capillary 491 (*)    All other components within normal limits  CBG MONITORING, ED - Abnormal; Notable for the following components:   Glucose-Capillary 353 (*)    All other components within normal limits    EKG None  Radiology No results found.  Procedures Procedures (including critical care time)  Medications Ordered in ED  Medications  sodium chloride 0.9 % bolus 1,000 mL (0 mLs Intravenous Stopped 11/29/18 1642)     Initial Impression / Assessment and Plan / ED Course  I have reviewed the triage vital signs and the nursing notes.  Pertinent labs & imaging results that were available during my care of the patient were reviewed by me and considered in my medical decision making (see chart for details).       Michael Snyder is a 68 y.o. male who presents to ED by recommendations of his PCP for hyperglycemia.  Glucometer read high this afternoon.  He denies any infectious symptoms.  He is afebrile and hemodynamically stable with clear lung exam and benign abdominal exam.  He appears quite well.  He states that had he not seen the reading on his glucometer, he would believe that he is in his usual state of health and feels asymptomatic.  Of note, he was recently started on prednisone for gout flare.  Feels as if his gout flare has resolved.  Has now taken the last dose of his prednisone.  Given no infectious symptoms or changes in his medication/diet, prednisone likely contributing to his hyperglycemia today.  Will obtain labs and urinalysis as well as provide IV hydration.  Labs reviewed: White count of 14.5.  No infectious symptoms.  Afebrile and hemodynamically stable.  Suspect this is secondary to recent steroid use.  CMP with glucose of 500.  CO2 of 19.  Normal anion gap.  Clinically not in DKA. UA shows no ketones or signs of infection.  Discussed this with Dr. Raeford Razor who saw patient in the clinic on 5/15.  Recommends doubling glipizide dose to help control hyperglycemia.  Frequent blood sugar checks.  Discussed medication changes with patient and included in discharge summary as well.  Encouraged him to call PCP in the morning to schedule recheck.  Reasons to return to the emergency department discussed at length.  All questions answered.  Patient discussed with Dr. Alvino Chapel who agrees with treatment plan.    Final Clinical Impressions(s) / ED Diagnoses   Final diagnoses:  Hyperglycemia    ED Discharge Orders    None       Jenai Scaletta, Ozella Almond, PA-C 11/29/18 1708    Davonna Belling, MD 11/29/18 2229

## 2018-12-05 ENCOUNTER — Ambulatory Visit: Payer: Self-pay

## 2018-12-05 NOTE — Telephone Encounter (Signed)
Pt seen in ED on 11/29/18 for hyperglycemia. His BS in ED was 500. Pt was instructed to double his dose of Glipizide to 10 mg twice daily.  Pt called today because his blood sugar is continuing to be elevated. His BS this morning was 239. Pt stated that he is not having any other symptoms of hyperglycemia. Pt stated he has increased is water intake to help with his elevated BS. Discussed ketone strips and checking his urine when his BS is elevated or when he is feeling sick.  Pt was on prednisone for a gout flare and has completed the therapy.  Care advice given to pt and pt verbalized understanding. Called [facility] and spoke with Tammy. NT advised to send note to office so that Dr Okey Dupre can review and advise.   Reason for Disposition . Blood glucose 70-240 mg/dL (3.9 -11.5 mmol/L)  Answer Assessment - Initial Assessment Questions 1. BLOOD GLUCOSE: "What is your blood glucose level?"      239 2. ONSET: "When did you check the blood glucose?"     0600 3. USUAL RANGE: "What is your glucose level usually?" (e.g., usual fasting morning value, usual evening value)     90-130's 4. KETONES: "Do you check for ketones (urine or blood test strips)?" If yes, ask: "What does the test show now?"      no 5. TYPE 1 or 2:  "Do you know what type of diabetes you have?"  (e.g., Type 1, Type 2, Gestational; doesn't know)      Type 2  6. INSULIN: "Do you take insulin?" "What type of insulin(s) do you use? What is the mode of delivery? (syringe, pen (e.g., injection or  pump)?"      no 7. DIABETES PILLS: "Do you take any pills for your diabetes?" If yes, ask: "Have you missed taking any pills recently?"     Yes-no 8. OTHER SYMPTOMS: "Do you have any symptoms?" (e.g., fever, frequent urination, difficulty breathing, dizziness, weakness, vomiting)     no 9. PREGNANCY: "Is there any chance you are pregnant?" "When was your last menstrual period?"     n/a  Protocols used: DIABETES - HIGH BLOOD SUGAR-A-AH

## 2018-12-05 NOTE — Telephone Encounter (Signed)
Patient informed of MD response and stated understanding  

## 2018-12-05 NOTE — Telephone Encounter (Signed)
I would be very cautious about this dose of glipizide as this is not recommended for someone in his age range. I would recommend to watch sugars in foods and increase water intake. The sugars will likely settle down in a few weeks or less from the prednisone. I would resume prior dosing of glipizide.

## 2018-12-23 ENCOUNTER — Telehealth: Payer: Self-pay

## 2018-12-23 MED ORDER — COLCHICINE 0.6 MG PO TABS: 0.6000 mg | ORAL_TABLET | Freq: Every day | ORAL | 1 refills | Status: DC | PRN

## 2018-12-23 NOTE — Telephone Encounter (Signed)
Sent in

## 2018-12-23 NOTE — Addendum Note (Signed)
Addended by: Pricilla Holm A on: 12/23/2018 12:56 PM   Modules accepted: Orders

## 2018-12-23 NOTE — Telephone Encounter (Signed)
Copied from [place] [id number]. Topic: General - Other >> Dec 23, 2018 10:26 AM Carolyn Stare wrote:  Pt cal lto ask for COLICHINE for his gout   Pharmacy [facility] [address]

## 2019-01-10 ENCOUNTER — Telehealth: Payer: Self-pay | Admitting: Internal Medicine

## 2019-01-10 NOTE — Telephone Encounter (Signed)
Pt stated his colchicine 0.6 MG tablet  Is not working for his gout pain. He would like to know if allopurinol can be prescribed or another medication. Please advise.

## 2019-01-11 NOTE — Telephone Encounter (Signed)
Appointment scheduled.

## 2019-01-11 NOTE — Telephone Encounter (Signed)
Needs visit if he is having a gout flare.

## 2019-01-12 ENCOUNTER — Encounter: Payer: Self-pay | Admitting: Internal Medicine

## 2019-01-12 ENCOUNTER — Ambulatory Visit (INDEPENDENT_AMBULATORY_CARE_PROVIDER_SITE_OTHER): Payer: Medicare Other | Admitting: Internal Medicine

## 2019-01-12 ENCOUNTER — Other Ambulatory Visit: Payer: Self-pay

## 2019-01-12 VITALS — BP 140/100 | HR 83 | Temp 97.8°F | Ht 72.0 in | Wt 177.0 lb

## 2019-01-12 DIAGNOSIS — M25532 Pain in left wrist: Secondary | ICD-10-CM | POA: Diagnosis not present

## 2019-01-12 MED ORDER — METHYLPREDNISOLONE ACETATE 40 MG/ML IJ SUSP
40.0000 mg | Freq: Once | INTRAMUSCULAR | Status: AC
  Administered 2019-01-12: 16:00:00 40 mg via INTRAMUSCULAR

## 2019-01-12 MED ORDER — DICLOFENAC SODIUM 75 MG PO TBEC: 75.0000 mg | DELAYED_RELEASE_TABLET | Freq: Two times a day (BID) | ORAL | 0 refills | Status: DC

## 2019-01-12 NOTE — Progress Notes (Signed)
Subjective:   Patient ID: Michael BanasWalter Snyder, male    DOB: 05/10/1951, 68 y.o.   MRN: [id number]  HPI The patient is a 68 YO man coming in for left wrist pain. Started back in March. Initially he went to the ER thinking it could be a break because it was swollen and hurting so much. The x-ray did not show fracture but did have a lot of redness and pain in the skin. He was treated with an antibiotic course to cover for cellulitis and he did improve some. Several weeks later the pain was back and severe. Seen sports medicine and treated for gout. The prednisone course he was given caused his blood sugars to spike into 500s and he ended up getting some short term change in medications. Sugars are still not 100% back to normal even now although he is back on his normal medications now. He did get relief some during taking prednisone (some hesitation with this) but the pain was back immediately after stopping. This was 1-2 months ago and he was just dealing with it. Talked to his friends with gout and wanted to try colchicine which was sent in about 3 weeks ago. He has been taking this daily and it did not seem to help in the slightest. He then called in wanting allopurinol called in and we asked him to return. He is now having pain in the wrist more localized. No redness or swelling of the joint. Denies injury or overuse although the pain is worse upon movement of the thumb.   Review of Systems  Constitutional: Negative.   HENT: Negative.   Eyes: Negative.   Respiratory: Negative for cough, chest tightness and shortness of breath.   Cardiovascular: Negative for chest pain, palpitations and leg swelling.  Gastrointestinal: Negative for abdominal distention, abdominal pain, constipation, diarrhea, nausea and vomiting.  Musculoskeletal: Positive for arthralgias and myalgias.  Skin: Negative.   Neurological: Negative.   Psychiatric/Behavioral: Negative.     Objective:  Physical Exam Constitutional:    Appearance: He is well-developed.  HENT:     Head: Normocephalic and atraumatic.  Neck:     Musculoskeletal: Normal range of motion.  Cardiovascular:     Rate and Rhythm: Normal rate and regular rhythm.  Pulmonary:     Effort: Pulmonary effort is normal. No respiratory distress.     Breath sounds: Normal breath sounds. No wheezing or rales.  Abdominal:     General: Bowel sounds are normal. There is no distension.     Palpations: Abdomen is soft.     Tenderness: There is no abdominal tenderness. There is no rebound.  Musculoskeletal:        General: Tenderness present.     Comments: No redness or swelling in the left wrist, pain is localized at the base of the left thumb and worse with movement of the thumb  Skin:    General: Skin is warm and dry.  Neurological:     Mental Status: He is alert and oriented to person, place, and time.     Coordination: Coordination normal.     Vitals:   01/12/19 1603  BP: (!) 140/100  Pulse: 83  Temp: 97.8 F (36.6 C)  TempSrc: Oral  SpO2: 98%  Weight: 177 lb (80.3 kg)  Height: 6' (1.829 m)    Assessment & Plan:  Depo-medrol 40 mg IM given at visit  Visit time 25 minutes: greater than 50% of that time was spent in face to face counseling and  coordination of care with the patient: counseled about likely etiology and then re-evaluation after discussion with sports medicine and how this changes our initial plan

## 2019-01-12 NOTE — Patient Instructions (Signed)
We have given you a shot today.   We will have you keep taking the colchicine 1 pill daily.   We want you to take diclofenac 1 pill twice a day for the next 1-2 weeks for the pain as needed.   Call us in 1-2 weeks and let us know how you are doing.   If you are getting better then we will check some labs to see if you need allopurinol.

## 2019-01-13 NOTE — Assessment & Plan Note (Signed)
Case reviewed with sports medicine and re-read of prior US imaging reveals more likely tendon tear with hematoma back in May. This makes gout less likely. Will still cover with depo-medrol 40 mg IM today. Will put in thumb spica brace and have him follow up with sports medicine in about 2 weeks to reassess with ultrasound. Rx diclofenac for pain and can stop colchicine. Has never had uric acid outside of flare to tell if gout is more likely. Plan to control pain and reassess uric acid in 4-6 weeks.

## 2019-01-18 DIAGNOSIS — M25532 Pain in left wrist: Secondary | ICD-10-CM | POA: Diagnosis not present

## 2019-02-02 ENCOUNTER — Ambulatory Visit: Payer: Self-pay

## 2019-02-02 ENCOUNTER — Other Ambulatory Visit: Payer: Self-pay

## 2019-02-02 ENCOUNTER — Ambulatory Visit: Payer: Medicare Other | Admitting: Family Medicine

## 2019-02-02 ENCOUNTER — Encounter: Payer: Self-pay | Admitting: Family Medicine

## 2019-02-02 VITALS — BP 142/80 | HR 90 | Ht 72.0 in | Wt 179.0 lb

## 2019-02-02 DIAGNOSIS — M25532 Pain in left wrist: Secondary | ICD-10-CM

## 2019-02-02 NOTE — Progress Notes (Signed)
[facility] [place], [place] [address] Phone: [phone] Subjective:   I Michael Snyder am serving as a Education administrator for Dr. Hulan Saas. Since 6 to 8 weeks  CC: Left wrist follow-up  XAJ:OINOMVEHMC  Michael Snyder is a 68 y.o. male coming in with complaint of left wrist pain. Burning sensation that goes up the arm. Uses a brace that he wears all day. Forgot the brace for today's visit. States the brace makes the wrist feel better. Pain radiates to the thumb. Some numbness in the fingers.  Patient states it is more of an aching sensation and severe.  Not really taking anything for pain at the moment.     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   Pacemaker    Medtronic Adapta [id number]   Seizure disorder ([place])    Seizures ([place])    last time in 1990's   Past Surgical History:  Procedure Laterality Date   LACERATION REPAIR  1980   scalp   PACEMAKER LEAD REMOVAL N/A 03/23/2014   Procedure: PACEMAKER LEAD REMOVAL;  Surgeon: Evans Lance, MD;  Location: [facility];  Service: Cardiovascular;  Laterality: N/A;   PTDVP     7/08 medtronic DDD   Social History   Socioeconomic History   Marital status: Married    Spouse name: Not on file   Number of children: 3   Years of education: Not on file   Highest education level: Not on file  Occupational History   Occupation: Games developer, truck Solicitor strain: Not hard at all   Food insecurity    Worry: Never true    Inability: Never true   Transportation needs    Medical: No    Non-medical: No  Tobacco Use   Smoking status: Never Smoker   Smokeless tobacco: Never Used  Substance and Sexual Activity   Alcohol use: No    Comment: quit 20 yrs ago    Drug use: No   Sexual activity: Not on file  Lifestyle   Physical activity    Days per week: 4 days    Minutes per session: 50 min   Stress: Not at all  Relationships   Social connections    Talks on phone: More than three times a week    Gets together: More than three times a week    Attends religious service: More than 4 times per year    Active member of club or organization: Yes    Attends meetings of clubs or organizations: More than 4 times per year    Relationship status: Married  Other Topics Concern   Not on file  Social History Narrative   12th grade education. H/O substance abuse- now abstemious and works with other in recovery..Attends NA on a regular basis and serves as a sponsor. Married 18 years-divorced. Married 5 years 2nd marriage.3 daughters previous marriage, 9 grandchildren, 3 great-grands. Work- short Associate Professor            Allergies  Allergen Reactions   Clindamycin/Lincomycin Swelling    Possible facial and lip swelling   Ace Inhibitors     Facial and lip swelling possibly  associated   Hctz [Hydrochlorothiazide] Swelling   Family History  Problem Relation Age of Onset   Hypertension Mother    COPD Mother    Hypertension Brother    Cancer Brother        leukemia   Hypertension Brother     Current Outpatient Medications (Endocrine & Metabolic):    glipiZIDE (GLUCOTROL) 5 MG tablet, TAKE 1 TABLET BY MOUTH  TWICE A DAY BEFORE MEALS   pioglitazone (ACTOS) 30 MG tablet, Take 1 tablet (30 mg total) by mouth daily.   predniSONE (DELTASONE) 20 MG tablet, TAKE 2 TABLETS BY MOUTH ONCE DAILY FOR 3 DAYS THEN 1 TAB DAILY FOR 3 DAYS THEN 1/2 TAB DAILY FOR 2 DAYS  Current Outpatient Medications (Cardiovascular):    amLODipine (NORVASC) 5 MG tablet, Take 1 tablet (5 mg total) by mouth daily.   hydrochlorothiazide (MICROZIDE) 12.5 MG capsule, Take 1 capsule (12.5 mg total) by mouth daily.   Current Outpatient Medications (Analgesics):      aspirin EC 81 MG tablet, Take 81 mg daily by mouth.   diclofenac (VOLTAREN) 75 MG EC tablet, Take 1 tablet (75 mg total) by mouth 2 (two) times daily.   Current Outpatient Medications (Other):    ALPHAGAN P 0.1 % SOLN, Place 2 drops into the left eye daily.   Blood Glucose Monitoring Suppl (ONETOUCH VERIO IQ SYSTEM) w/Device KIT, USE TO CHECK BLOOD SUGARS  TWICE A DAY   Calcium Carbonate-Vitamin D (CALTRATE 600+D) 600-400 MG-UNIT per tablet, Take 1 tablet by mouth 2 (two) times daily.   gabapentin (NEURONTIN) 300 MG capsule, Take 300 mg by mouth every 6 (six) hours as needed (pain).    Lancets (ONETOUCH DELICA PLUS [id number]) MISC, CHECK BLOOD SUGAR TWO TIMES DAILY   Multiple Vitamin (MULTIVITAMIN WITH MINERALS) TABS tablet, Take 1 tablet by mouth daily.   Omega-3 Fatty Acids (FISH OIL) 300 MG CAPS, Take 1 capsule (300 mg total) by mouth daily.   ONETOUCH VERIO test strip, TEST TWO TIMES DAILY   potassium chloride SA (K-DUR,KLOR-CON) 20 MEQ tablet, TAKE 1 TABLET BY MOUTH TWO  TIMES DAILY   sulfamethoxazole-trimethoprim (BACTRIM DS,SEPTRA DS) 800-160 MG tablet, Take 1 tablet by mouth 2 (two) times daily.   triamcinolone ointment (KENALOG) 0.5 %, APPLY  OINTMENT TOPICALLY TWICE DAILY    Past medical history, social, surgical and family history all reviewed in electronic medical record.  No pertanent information unless stated regarding to the chief complaint.   Review of Systems:  No headache, visual changes, nausea, vomiting, diarrhea, constipation, dizziness, abdominal pain, skin rash, fevers, chills, night sweats, weight loss, swollen lymph nodes, body aches, joint swelling, muscle aches, chest pain, shortness of breath, mood changes.   Objective  Blood pressure (!) 142/80, pulse 90, height 6' (1.829 m), weight 179 lb (81.2 kg), SpO2 98 %.   General: No apparent distress alert and oriented x3 mood and affect normal, dressed appropriately.  HEENT: Pupils equal, extraocular  movements intact  Respiratory: Patient's speak in full sentences and does not appear short of breath  Cardiovascular: No lower extremity edema, non tender, no erythema  Skin: Warm dry intact with no signs of infection or rash on extremities or on axial skeleton.  Abdomen: Soft nontender  Neuro: Cranial nerves II through XII are intact, neurovascularly intact in all extremities with 2+ DTRs and 2+ pulses.  Lymph: No lymphadenopathy of posterior or anterior cervical chain or axillae bilaterally.  Gait normal with good balance and coordination.  MSK:  Non tender  with full range of motion and good stability and symmetric strength and tone of shoulders, elbows, hip, knee and ankles bilaterally.  Left wrist exam shows the patient does have very mild swelling of the abductor pollicis longus.  Mild positive Finkelstein's.  Minimal arthritic changes.  Patient very minimally tender in the anatomical snuffbox.  Limited musculoskeletal ultrasound was performed and interpreted by Lyndal Pulley  Limited ultrasound of patient's wrist shows the patient's scaphoid bone seems to be unremarkable.  Patient does though have what appears to be a partial tear of the abductor pollicis longus tendon sheath but does seem to be healing.  Patient still has increasing Doppler flow in the surrounding area in the soft tissue swelling. Impression: Abductor pollicis longus tendon tear with interval healing with continued soft tissue swelling.    Impression and Recommendations:     This case required medical decision making of moderate complexity. The above documentation has been reviewed and is accurate and complete Lyndal Pulley, DO       Note: This dictation was prepared with Dragon dictation along with smaller phrase technology. Any transcriptional errors that result from this process are unintentional.

## 2019-02-02 NOTE — Patient Instructions (Signed)
Wear the brace at night See [place] in 6-8 weeks if we not better Tart cherry extract 1200mg  at night

## 2019-02-02 NOTE — Assessment & Plan Note (Signed)
Seems to be more secondary to an abductor pollicis longus tenosynovitis with a partial tear that seems to be interval healing based on what we see today.  Home exercises given, topical anti-inflammatories given, discussed icing regimen and over-the-counter medications that could be more beneficial.  Avoid any oral anti-inflammatories or prednisone secondary to patients with brittle diabetes.  Follow-up with me again in 6 to 8 weeks and will consider the possibility of injection if not improved

## 2019-02-15 NOTE — Progress Notes (Signed)
Subjective:   Michael Snyder is a 68 y.o. male who presents for Medicare Annual/Subsequent preventive examination.  Review of Systems:     Sleep patterns: feels rested on waking, does not get up to void and sleeps 7-8 hours nightly.    Home Safety/Smoke Alarms: Feels safe in home. Smoke alarms in place.  Living environment; residence and Firearm Safety: 1-story house/ trailer Lives alone*, no needs for DME, good support system. Seat Belt Safety/Bike Helmet: Wears seat belt.   PSA-  Lab Results  Component Value Date   PSA 0.70 05/16/2013   PSA 0.83 05/27/2010   PSA 0.87 06/05/2009       Objective:    Vitals: BP 138/64   Pulse 71   Resp 16   Ht 6' (1.829 m)   Wt 180 lb (81.6 kg)   SpO2 99%   BMI 24.41 kg/m   Body mass index is 24.41 kg/m.  Advanced Directives 02/16/2019 11/29/2018 10/07/2018 09/14/2018 12/30/2017 03/24/2014 03/23/2014  Does Patient Have a Medical Advance Directive? No No No No Yes No No  Type of Advance Directive - - - - Press photographer;Living will - -  Copy of [place] in Chart? - - - - No - copy requested - -  Would patient like information on creating a medical advance directive? No - Patient declined - No - Guardian declined - - No - patient declined information No - patient declined information    Tobacco Social History   Tobacco Use  Smoking Status Never Smoker  Smokeless Tobacco Never Used     Counseling given: Not Answered  Past Medical History:  Diagnosis Date  . Alcohol abuse   . Arthritis   . Diabetes mellitus, type 2 ([place])   . Head injury, closed, without LOC 1980s  . History of alcohol abuse   . Hypertension   . 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 Pacemaker    [place]  . Seizure disorder ([place])   . Seizures ([place])    last time in 1990's   Past Surgical History:  Procedure Laterality Date  . LACERATION REPAIR  1980   scalp  . PACEMAKER LEAD REMOVAL N/A 03/23/2014   Procedure: PACEMAKER LEAD REMOVAL;  Surgeon: Evans Lance, MD;  Location: [facility];  Service: Cardiovascular;  Laterality: N/A;  . PTDVP     7/08 medtronic DDD   Family History  Problem Relation Age of Onset  . Hypertension Mother   . COPD Mother   . Hypertension Brother   . Cancer Brother        leukemia  . Hypertension Brother    Social History   Socioeconomic History  . Marital status: Legally Separated    Spouse name: Not on file  . Number of children: 3  . Years of education: Not on file  . Highest education level: Not on file  Occupational History  . Occupation: Games developer, truck Animator Needs  . Financial resource strain: Not hard at all  . Food insecurity    Worry: Never true    Inability: Never true  . Transportation needs    Medical: No    Non-medical: No  Tobacco Use  . Smoking status: Never Smoker  . Smokeless tobacco: Never Used  Substance and Sexual Activity  . Alcohol use: No    Comment: quit 20 yrs ago  . Drug use: No  . Sexual activity: Yes  Partners: Female  Lifestyle  . Physical activity    Days per week: 5 days    Minutes per session: 80 min  . Stress: Not at all  Relationships  . Social connections    Talks on phone: More than three times a week    Gets together: More than three times a week    Attends religious service: More than 4 times per year    Active member of club or organization: Yes    Attends meetings of clubs or organizations: More than 4 times per year    Relationship status: Separated  Other Topics Concern  . Not on file  Social History Narrative   12th grade education. H/O substance abuse- now abstemious and works with other in recovery..Attends NA on a regular basis and serves as a sponsor. Married 18 years-divorced. Married 5 years 2nd marriage.3 daughters previous marriage, 9 grandchildren, 3 great-grands. Work- Merchant navy officer             Outpatient Encounter Medications as of 02/16/2019  Medication  Sig  . ALPHAGAN P 0.1 % SOLN Place 2 drops into the left eye daily.  [name] amLODipine (NORVASC) 5 MG tablet Take 1 tablet (5 mg total) by mouth daily.  [name] aspirin EC 81 MG tablet Take 81 mg daily by mouth.  . Blood Glucose Monitoring Suppl (ONETOUCH VERIO IQ SYSTEM) w/Device KIT USE TO CHECK BLOOD SUGARS  TWICE A DAY  . Calcium Carbonate-Vitamin D (CALTRATE 600+D) 600-400 MG-UNIT per tablet Take 1 tablet by mouth 2 (two) times daily.  . diclofenac (VOLTAREN) 75 MG EC tablet Take 1 tablet (75 mg total) by mouth 2 (two) times daily.  [name] gabapentin (NEURONTIN) 300 MG capsule Take 300 mg by mouth every 6 (six) hours as needed (pain).   [name] glipiZIDE (GLUCOTROL) 5 MG tablet TAKE 1 TABLET BY MOUTH  TWICE A DAY BEFORE MEALS  . hydrochlorothiazide (MICROZIDE) 12.5 MG capsule Take 1 capsule (12.5 mg total) by mouth daily.  . Lancets (ONETOUCH DELICA PLUS [id number]) MISC CHECK BLOOD SUGAR TWO TIMES DAILY  . Multiple Vitamin (MULTIVITAMIN WITH MINERALS) TABS tablet Take 1 tablet by mouth daily.  . Omega-3 Fatty Acids (FISH OIL) 300 MG CAPS Take 1 capsule (300 mg total) by mouth daily.  Glory Rosebush VERIO test strip TEST TWO TIMES DAILY  . pioglitazone (ACTOS) 30 MG tablet Take 1 tablet (30 mg total) by mouth daily.  . potassium chloride SA (K-DUR,KLOR-CON) 20 MEQ tablet TAKE 1 TABLET BY MOUTH TWO  TIMES DAILY  . triamcinolone ointment (KENALOG) 0.5 % APPLY  OINTMENT TOPICALLY TWICE DAILY  . [DISCONTINUED] diclofenac (VOLTAREN) 75 MG EC tablet Take 1 tablet (75 mg total) by mouth 2 (two) times daily.  . [DISCONTINUED] predniSONE (DELTASONE) 20 MG tablet TAKE 2 TABLETS BY MOUTH ONCE DAILY FOR 3 DAYS THEN 1 TAB DAILY FOR 3 DAYS THEN 1/2 TAB DAILY FOR 2 DAYS (Patient not taking: Reported on 02/16/2019)  . [DISCONTINUED] sulfamethoxazole-trimethoprim (BACTRIM DS,SEPTRA DS) 800-160 MG tablet Take 1 tablet by mouth 2 (two) times daily. (Patient not taking: Reported on 02/16/2019)   No facility-administered encounter  medications on file as of 02/16/2019.     Activities of Daily Living In your present state of health, do you have any difficulty performing the following activities: 02/16/2019  Hearing? N  Vision? N  Difficulty concentrating or making decisions? N  Walking or climbing stairs? N  Dressing or bathing? N  Doing errands, shopping? N  Conservation officer, nature and  eating ? N  Using the Toilet? N  In the past six months, have you accidently leaked urine? N  Do you have problems with loss of bowel control? N  Managing your Medications? N  Managing your Finances? N  Housekeeping or managing your Housekeeping? N  Some recent data might be hidden    Patient Care Team: Hoyt Koch, MD as PCP - General (Internal Medicine)   Assessment:   This is a routine wellness examination for Michael Snyder. Physical assessment deferred to PCP.  Exercise Activities and Dietary recommendations Current Exercise Habits: The patient has a physically strenuous job, but has no regular exercise apart from work.  Diet (meal preparation, eat out, water intake, caffeinated beverages, dairy products, fruits and vegetables): in general, a "healthy" diet  , well balanced. eats a variety of fruits and vegetables daily, limits salt, fat/cholesterol, sugar,carbohydrates,caffeine, drinks 6-8 glasses of water daily.   Reviewed heart healthy and diabetic diet. Relevant patient education assigned to patient using Emmi.  Goals    . Patient Stated     Get my diabetes in better control by starting to count carbohydrates and monitoring sugar.        Fall Risk Fall Risk  02/16/2019 01/12/2019 12/30/2017 04/05/2017 08/21/2015  Falls in the past year? 0 0 No No No  Number falls in past yr: 0 - - - -    Depression Screen PHQ 2/9 Scores 02/16/2019 01/12/2019 12/30/2017 04/05/2017  PHQ - 2 Score 0 0 0 0    Cognitive Function       Ad8 score reviewed for issues:  Issues making decisions: no  Less interest in hobbies / activities: no   Repeats questions, stories (family complaining): no  Trouble using ordinary gadgets (microwave, computer, phone):no  Forgets the month or year: no  Mismanaging finances: no  Remembering appts: no  Daily problems with thinking and/or memory: no Ad8 score is= 0  Immunization History  Administered Date(s) Administered  . Influenza Whole 03/30/2008, 04/16/2009, 06/05/2010  . Influenza, High Dose Seasonal PF 06/02/2016, 04/05/2017, 03/29/2018  . Influenza,inj,Quad PF,6+ Mos 05/16/2013, 03/08/2014, 08/21/2015  . Pneumococcal Conjugate-13 11/01/2014  . Pneumococcal Polysaccharide-23 09/13/2012, 12/30/2017  . Td 03/30/2008  . Tdap 05/02/2018  . Zoster 07/20/2014  . Zoster Recombinat (Shingrix) 12/30/2017   Screening Tests Health Maintenance  Topic Date Due  . OPHTHALMOLOGY EXAM  06/07/2018  . HEMOGLOBIN A1C  11/01/2018  . INFLUENZA VACCINE  02/04/2019  . FOOT EXAM  05/03/2019  . URINE MICROALBUMIN  05/03/2019  . COLONOSCOPY  12/16/2022  . TETANUS/TDAP  05/02/2028  . Hepatitis C Screening  Completed  . PNA vac Low Risk Adult  Completed       Plan:    Reviewed health maintenance screenings with patient today and relevant education, vaccines, and/or referrals were provided.   I have personally reviewed and noted the following in the patient's chart:   . Medical and social history . Use of alcohol, tobacco or illicit drugs  . Current medications and supplements . Functional ability and status . Nutritional status . Physical activity . Advanced directives . List of other physicians . Vitals . Screenings to include cognitive, depression, and falls . Referrals and appointments  In addition, I have reviewed and discussed with patient certain preventive protocols, quality metrics, and best practice recommendations. A written personalized care plan for preventive services as well as general preventive health recommendations were provided to patient.     Michiel Cowboy, RN   02/16/2019

## 2019-02-16 ENCOUNTER — Other Ambulatory Visit: Payer: Self-pay

## 2019-02-16 ENCOUNTER — Ambulatory Visit (INDEPENDENT_AMBULATORY_CARE_PROVIDER_SITE_OTHER): Payer: Medicare Other | Admitting: *Deleted

## 2019-02-16 VITALS — BP 138/64 | HR 71 | Resp 16 | Ht 72.0 in | Wt 180.0 lb

## 2019-02-16 DIAGNOSIS — Z0000 Encounter for general adult medical examination without abnormal findings: Secondary | ICD-10-CM | POA: Diagnosis not present

## 2019-02-16 MED ORDER — DICLOFENAC SODIUM 75 MG PO TBEC: 75.0000 mg | DELAYED_RELEASE_TABLET | Freq: Two times a day (BID) | ORAL | 0 refills | Status: DC

## 2019-02-16 NOTE — Patient Instructions (Addendum)
Continue to eat heart healthy diet (full of fruits, vegetables, whole grains, lean protein, water--limit salt, fat, and sugar intake) and increase physical activity as tolerated.  Continue doing brain stimulating activities (puzzles, reading, adult coloring books, staying active) to keep memory sharp.    Michael Snyder , Thank you for taking time to come for your Medicare Wellness Visit. I appreciate your ongoing commitment to your health goals. Please review the following plan we discussed and let me know if I can assist you in the future.   These are the goals we discussed: Goals    . Patient Stated     Get my diabetes in better control by starting to count carbohydrates and monitoring sugar.        This is a list of the screening recommended for you and due dates:  Health Maintenance  Topic Date Due  . Eye exam for diabetics  06/07/2018  . Hemoglobin A1C  11/01/2018  . Flu Shot  02/04/2019  . Complete foot exam   05/03/2019  . Urine Protein Check  05/03/2019  . Colon Cancer Screening  12/16/2022  . Tetanus Vaccine  05/02/2028  .  Hepatitis C: One time screening is recommended by Center for Disease Control  (CDC) for  adults born from [birth year] through 1966.   Completed  . Pneumonia vaccines  Completed    Preventive Care 69 Years and Older, Male Preventive care refers to lifestyle choices and visits with your health care provider that can promote health and wellness. This includes:  A yearly physical exam. This is also called an annual well check.  Regular dental and eye exams.  Immunizations.  Screening for certain conditions.  Healthy lifestyle choices, such as diet and exercise. What can I expect for my preventive care visit? Physical exam Your health care provider will check:  Height and weight. These may be used to calculate body mass index (BMI), which is a measurement that tells if you are at a healthy weight.  Heart rate and blood pressure.  Your skin for abnormal  spots. Counseling Your health care provider may ask you questions about:  Alcohol, tobacco, and drug use.  Emotional well-being.  Home and relationship well-being.  Sexual activity.  Eating habits.  History of falls.  Memory and ability to understand (cognition).  Work and work Statistician. What immunizations do I need?  Influenza (flu) vaccine  This is recommended every year. Tetanus, diphtheria, and pertussis (Tdap) vaccine  You may need a Td booster every 10 years. Varicella (chickenpox) vaccine  You may need this vaccine if you have not already been vaccinated. Zoster (shingles) vaccine  You may need this after age 69. Pneumococcal conjugate (PCV13) vaccine  One dose is recommended after age 69. Pneumococcal polysaccharide (PPSV23) vaccine  One dose is recommended after age 69. Measles, mumps, and rubella (MMR) vaccine  You may need at least one dose of MMR if you were born in 1957 or later. You may also need a second dose. Meningococcal conjugate (MenACWY) vaccine  You may need this if you have certain conditions. Hepatitis A vaccine  You may need this if you have certain conditions or if you travel or work in places where you may be exposed to hepatitis A. Hepatitis B vaccine  You may need this if you have certain conditions or if you travel or work in places where you may be exposed to hepatitis B. Haemophilus influenzae type b (Hib) vaccine  You may need this if you have certain  conditions. You may receive vaccines as individual doses or as more than one vaccine together in one shot (combination vaccines). Talk with your health care provider about the risks and benefits of combination vaccines. What tests do I need? Blood tests  Lipid and cholesterol levels. These may be checked every 5 years, or more frequently depending on your overall health.  Hepatitis C test.  Hepatitis B test. Screening  Lung cancer screening. You may have this screening  every year starting at age 69 if you have a 30-pack-year history of smoking and currently smoke or have quit within the past 15 years.  Colorectal cancer screening. All adults should have this screening starting at age 69 and continuing until age 69. Your health care provider may recommend screening at age 69 if you are at increased risk. You will have tests every 1-10 years, depending on your results and the type of screening test.  Prostate cancer screening. Recommendations will vary depending on your family history and other risks.  Diabetes screening. This is done by checking your blood sugar (glucose) after you have not eaten for a while (fasting). You may have this done every 1-3 years.  Abdominal aortic aneurysm (AAA) screening. You may need this if you are a current or former smoker.  Sexually transmitted disease (STD) testing. Follow these instructions at home: Eating and drinking  Eat a diet that includes fresh fruits and vegetables, whole grains, lean protein, and low-fat dairy products. Limit your intake of foods with high amounts of sugar, saturated fats, and salt.  Take vitamin and mineral supplements as recommended by your health care provider.  Do not drink alcohol if your health care provider tells you not to drink.  If you drink alcohol: ? Limit how much you have to 0-2 drinks a day. ? Be aware of how much alcohol is in your drink. In the [place] one drink equals one 12 oz bottle of beer (355 mL), one 5 oz glass of Lulia Schriner (148 mL), or one 1 oz glass of hard liquor (44 mL). Lifestyle  Take daily care of your teeth and gums.  Stay active. Exercise for at least 30 minutes on 5 or more days each week.  Do not use any products that contain nicotine or tobacco, such as cigarettes, e-cigarettes, and chewing tobacco. If you need help quitting, ask your health care provider.  If you are sexually active, practice safe sex. Use a condom or other form of protection to prevent STIs  (sexually transmitted infections).  Talk with your health care provider about taking a low-dose aspirin or statin. What's next?  Visit your health care provider once a year for a well check visit.  Ask your health care provider how often you should have your eyes and teeth checked.  Stay up to date on all vaccines. This information is not intended to replace advice given to you by your health care provider. Make sure you discuss any questions you have with your health care provider. Document Released: 07/19/2015 Document Revised: 06/16/2018 Document Reviewed: 06/16/2018 Elsevier Patient Education  2020 [employer].

## 2019-02-16 NOTE — Progress Notes (Signed)
Medical screening examination/treatment/procedure(s) were performed by non-physician practitioner and as supervising physician I was immediately available for consultation/collaboration. I agree with above. Khamila Bassinger A Shannara Winbush, MD 

## 2019-02-26 ENCOUNTER — Other Ambulatory Visit: Payer: Self-pay | Admitting: Internal Medicine

## 2019-03-13 NOTE — Progress Notes (Signed)
[facility] [address] [place], [name] Phone: [phone] Subjective:   I [name] am serving as a Education administrator for Dr. Hulan Saas.     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  Home exercises given, topical anti-inflammatories given, discussed icing regimen and over-the-counter medications that could be more beneficial.  Avoid any oral anti-inflammatories or prednisone secondary to patients with brittle diabetes.  Follow-up with me again in 6 to 8 weeks and will consider the possibility of injection if not improved  03/14/2019 [name] is a 68 y.o. male coming in with complaint of left wrist pain. States the wrist is not feeling better. Off and on pain. Thumb ROM causes pain.      Previous ultrasound that showed abductor pollicis longus tendon tear with interval healing continued soft tissue swelling  This is been occurring with  Past Medical History:  Diagnosis Date  . Alcohol abuse   . Arthritis   . Diabetes mellitus, type 2 ([place])   . Head injury, closed, without LOC 1980s  . History of alcohol abuse   . Hypertension   . 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 Pacemaker    [place]  . Seizure disorder ([place])   . Seizures ([place])    last time in 1990's   Past Surgical History:  Procedure Laterality Date  . LACERATION REPAIR  1980   scalp  . PACEMAKER LEAD REMOVAL N/A 03/23/2014   Procedure: PACEMAKER LEAD REMOVAL;  Surgeon: Evans Lance, MD;  Location: [facility];  Service: Cardiovascular;  Laterality: N/A;  . 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 Marital status: Legally Separated    Spouse name: Not on file  . Number of children: 3  . Years of education: Not on file  . Highest education level: Not on  file  Occupational History  . Occupation: Games developer, truck Animator Needs  . Financial resource strain: Not hard at all  . Food insecurity    Worry: Never true    Inability: Never true  . Transportation needs    Medical: No    Non-medical: No  Tobacco Use  . Smoking status: Never Smoker  . Smokeless tobacco: Never Used  Substance and Sexual Activity  . Alcohol use: No    Comment: quit 20 yrs ago  . Drug use: No  . Sexual activity: Yes    Partners: Female  Lifestyle  . Physical activity    Days per week: 5 days    Minutes per session: 80 min  . Stress: Not at all  Relationships  . Social connections    Talks on phone: More than three times a week    Gets together: More than three times a week    Attends religious service: More than 4 times per year    Active member of club or organization: Yes    Attends meetings of clubs or organizations: More than 4 times per year    Relationship status: Separated  Other Topics Concern  . Not on file  Social History Narrative   12th grade education. H/O substance abuse- now abstemious and works with other in recovery..Attends NA on a regular basis and serves as  a sponsor. Married 18 years-divorced. Married 5 years 2nd marriage.3 daughters previous marriage, 9 grandchildren, 3 great-grands. Work- short haul Administrator            Allergies  Allergen Reactions  . Clindamycin/Lincomycin Swelling    Possible facial and lip swelling  . Ace Inhibitors     Facial and lip swelling possibly associated  . Hctz [Hydrochlorothiazide] Swelling   Family History  Problem Relation Age of Onset  . Hypertension Mother   . COPD Mother   . Hypertension Brother   . Cancer Brother        leukemia  . Hypertension Brother     Current Outpatient Medications (Endocrine & Metabolic):  .  glipiZIDE (GLUCOTROL) 5 MG tablet, TAKE 1 TABLET BY MOUTH  TWICE A DAY BEFORE MEALS .  pioglitazone (ACTOS) 30 MG tablet, Take 1 tablet (30 mg total) by  mouth daily. Need office visit for further refills  Current Outpatient Medications (Cardiovascular):  .  amLODipine (NORVASC) 5 MG tablet, Take 1 tablet (5 mg total) by mouth daily. .  hydrochlorothiazide (MICROZIDE) 12.5 MG capsule, Take 1 capsule (12.5 mg total) by mouth daily.   Current Outpatient Medications (Analgesics):  .  aspirin EC 81 MG tablet, Take 81 mg daily by mouth. .  diclofenac (VOLTAREN) 75 MG EC tablet, Take 1 tablet (75 mg total) by mouth 2 (two) times daily.   Current Outpatient Medications (Other):  [name]  ALPHAGAN P 0.1 % SOLN, Place 2 drops into the left eye daily. .  Blood Glucose Monitoring Suppl (ONETOUCH VERIO IQ SYSTEM) w/Device KIT, USE TO CHECK BLOOD SUGARS  TWICE A DAY .  Calcium Carbonate-Vitamin D (CALTRATE 600+D) 600-400 MG-UNIT per tablet, Take 1 tablet by mouth 2 (two) times daily. [name]  gabapentin (NEURONTIN) 300 MG capsule, Take 300 mg by mouth every 6 (six) hours as needed (pain).  .  Lancets (ONETOUCH DELICA PLUS [id number]) MISC, CHECK BLOOD SUGAR TWO TIMES DAILY .  Multiple Vitamin (MULTIVITAMIN WITH MINERALS) TABS tablet, Take 1 tablet by mouth daily. .  Omega-3 Fatty Acids (FISH OIL) 300 MG CAPS, Take 1 capsule (300 mg total) by mouth daily. Glory Rosebush VERIO test strip, TEST TWO TIMES DAILY .  potassium chloride SA (K-DUR,KLOR-CON) 20 MEQ tablet, TAKE 1 TABLET BY MOUTH TWO  TIMES DAILY .  triamcinolone ointment (KENALOG) 0.5 %, APPLY  OINTMENT TOPICALLY TWICE DAILY    Past medical history, social, surgical and family history all reviewed in electronic medical record.  No pertanent information unless stated regarding to the chief complaint.   Review of Systems:  No headache, visual changes, nausea, vomiting, diarrhea, constipation, dizziness, abdominal pain, skin rash, fevers, chills, night sweats, weight loss, swollen lymph nodes, body aches, joint swelling,, chest pain, shortness of breath, mood changes.  Positive muscle aches  Objective  Blood  pressure 140/90, pulse 84, height 6' (1.829 m), weight 183 lb (83 kg), SpO2 97 %.    General: No apparent distress alert and oriented x3 mood and affect normal, dressed appropriately.  HEENT: Pupils equal, extraocular movements intact  Respiratory: Patient's speak in full sentences and does not appear short of breath  Cardiovascular: No lower extremity edema, non tender, no erythema  Skin: Warm dry intact with no signs of infection or rash on extremities or on axial skeleton.  Abdomen: Soft nontender  Neuro: Cranial nerves II through XII are intact, neurovascularly intact in all extremities with 2+ DTRs and 2+ pulses.  Lymph: No lymphadenopathy of posterior or  anterior cervical chain or axillae bilaterally.  Gait normal with good balance and coordination.  MSK:  tender with full range of motion and good stability and symmetric strength and tone of shoulders, elbows, hip, knee and ankles bilaterally.  Mild arthritic changes Wrist: Left Inspection normal with no visible erythema or swelling. ROM smooth and normal with good flexion and extension and ulnar/radial deviation that is symmetrical with opposite wrist. Palpation is normal over metacarpals, navicular, lunate, and TFCC; tendons without tenderness/ swelling No snuffbox tenderness. No tenderness over Canal of [place]. Strength 5/5 in all directions without pain. Positive Finkelstein, negative Tinel's and phalens. Negative Watson's test. Contralateral wrist unremarkable  Limited musculoskeletal ultrasound was performed and interpreted by Lyndal Pulley  Limited ultrasound of patient's wrist shows the patient does have hypoechoic changes within the abductor pollicis longus sheath.  Mild improvement from previous exam.  Increasing Doppler flow noted. Impression: Very minimal interval healing of the abductor pollicis longus tendon sheath    Impression and Recommendations:     This case required medical decision making of moderate  complexity. The above documentation has been reviewed and is accurate and complete Lyndal Pulley, DO       Note: This dictation was prepared with Dragon dictation along with smaller phrase technology. Any transcriptional errors that result from this process are unintentional.

## 2019-03-14 ENCOUNTER — Ambulatory Visit: Payer: Self-pay

## 2019-03-14 ENCOUNTER — Other Ambulatory Visit: Payer: Self-pay

## 2019-03-14 ENCOUNTER — Encounter: Payer: Self-pay | Admitting: Family Medicine

## 2019-03-14 ENCOUNTER — Ambulatory Visit (INDEPENDENT_AMBULATORY_CARE_PROVIDER_SITE_OTHER): Payer: Medicare Other | Admitting: Family Medicine

## 2019-03-14 VITALS — BP 140/90 | HR 84 | Ht 72.0 in | Wt 183.0 lb

## 2019-03-14 DIAGNOSIS — M25532 Pain in left wrist: Secondary | ICD-10-CM

## 2019-03-14 DIAGNOSIS — M654 Radial styloid tenosynovitis [de Quervain]: Secondary | ICD-10-CM | POA: Insufficient documentation

## 2019-03-14 NOTE — Assessment & Plan Note (Signed)
Patient has not made any significant improvement.  Discussed with patient options.  We discussed further considerations to avoid.  Due to patient's elevated and blood sugar will instability.  Discussed with patient in great length.  Patient will come back again  Blood sugars are more under control and we will consider the injection at that point.  Patient is in agreement with the plan

## 2019-03-14 NOTE — Patient Instructions (Addendum)
Brace day and night for 1 week then nightly for 2 Continue the exercises 7:30 Monday 03/20/2019

## 2019-03-19 NOTE — Progress Notes (Deleted)
[facility] [address], [place] [address] Phone: [phone] Subjective:    I'm seeing this patient by the request  of:    CC: Left thumb pain follow-up  [id number]  Michael Snyder is a 68 y.o. male coming in with complaint of ***  Onset-  Location Duration-  Character- Aggravating factors- Reliving factors-  Therapies tried-  Severity-     Past Medical History:  Diagnosis Date  . Alcohol abuse   . Arthritis   . Diabetes mellitus, type 2 ([place])   . Head injury, closed, without LOC 1980s  . History of alcohol abuse   . Hypertension   . 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 Pacemaker    [place]  . Seizure disorder ([place])   . Seizures ([place])    last time in 1990's   Past Surgical History:  Procedure Laterality Date  . LACERATION REPAIR  1980   scalp  . PACEMAKER LEAD REMOVAL N/A 03/23/2014   Procedure: PACEMAKER LEAD REMOVAL;  Surgeon: Evans Lance, MD;  Location: [facility];  Service: Cardiovascular;  Laterality: N/A;  . 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 Marital status: Legally Separated    Spouse name: Not on file  . Number of children: 3  . Years of education: Not on file  . Highest education level: Not on file  Occupational History  . Occupation: Games developer, truck Animator Needs  . Financial resource strain: Not hard at all  . Food insecurity    Worry: Never true    Inability: Never true  . Transportation needs    Medical: No    Non-medical: No  Tobacco Use  . Smoking status: Never Smoker  . Smokeless tobacco: Never Used  Substance and Sexual Activity  . Alcohol use: No    Comment: quit 20 yrs ago  . Drug use: No  . Sexual activity: Yes    Partners: Female  Lifestyle  . Physical activity    Days per week: 5 days    Minutes per session: 80 min  . Stress: Not at all  Relationships  . Social connections    Talks on phone: More than  three times a week    Gets together: More than three times a week    Attends religious service: More than 4 times per year    Active member of club or organization: Yes    Attends meetings of clubs or organizations: More than 4 times per year    Relationship status: Separated  Other Topics Concern  . Not on file  Social History Narrative   12th grade education. H/O substance abuse- now abstemious and works with other in recovery..Attends NA on a regular basis and serves as a sponsor. Married 18 years-divorced. Married 5 years 2nd marriage.3 daughters previous marriage, 9 grandchildren, 3 great-grands. Work- short haul Administrator            Allergies  Allergen Reactions  . Clindamycin/Lincomycin Swelling    Possible facial and lip swelling  . Ace Inhibitors     Facial and lip swelling possibly associated  . Hctz [Hydrochlorothiazide] Swelling   Family History  Problem Relation Age of Onset  . Hypertension Mother   . COPD Mother   . Hypertension Brother   . Cancer Brother        leukemia  . Hypertension Brother  Current Outpatient Medications (Endocrine & Metabolic):  .  glipiZIDE (GLUCOTROL) 5 MG tablet, TAKE 1 TABLET BY MOUTH  TWICE A DAY BEFORE MEALS .  pioglitazone (ACTOS) 30 MG tablet, Take 1 tablet (30 mg total) by mouth daily. Need office visit for further refills  Current Outpatient Medications (Cardiovascular):  .  amLODipine (NORVASC) 5 MG tablet, Take 1 tablet (5 mg total) by mouth daily. .  hydrochlorothiazide (MICROZIDE) 12.5 MG capsule, Take 1 capsule (12.5 mg total) by mouth daily.   Current Outpatient Medications (Analgesics):  .  aspirin EC 81 MG tablet, Take 81 mg daily by mouth. .  diclofenac (VOLTAREN) 75 MG EC tablet, Take 1 tablet (75 mg total) by mouth 2 (two) times daily.   Current Outpatient Medications (Other):  [name]  ALPHAGAN P 0.1 % SOLN, Place 2 drops into the left eye daily. .  Blood Glucose Monitoring Suppl (ONETOUCH VERIO IQ SYSTEM)  w/Device KIT, USE TO CHECK BLOOD SUGARS  TWICE A DAY .  Calcium Carbonate-Vitamin D (CALTRATE 600+D) 600-400 MG-UNIT per tablet, Take 1 tablet by mouth 2 (two) times daily. [name]  gabapentin (NEURONTIN) 300 MG capsule, Take 300 mg by mouth every 6 (six) hours as needed (pain).  .  Lancets (ONETOUCH DELICA PLUS [id number]) MISC, CHECK BLOOD SUGAR TWO TIMES DAILY .  Multiple Vitamin (MULTIVITAMIN WITH MINERALS) TABS tablet, Take 1 tablet by mouth daily. .  Omega-3 Fatty Acids (FISH OIL) 300 MG CAPS, Take 1 capsule (300 mg total) by mouth daily. Glory Rosebush VERIO test strip, TEST TWO TIMES DAILY .  potassium chloride SA (K-DUR,KLOR-CON) 20 MEQ tablet, TAKE 1 TABLET BY MOUTH TWO  TIMES DAILY .  triamcinolone ointment (KENALOG) 0.5 %, APPLY  OINTMENT TOPICALLY TWICE DAILY    Past medical history, social, surgical and family history all reviewed in electronic medical record.  No pertanent information unless stated regarding to the chief complaint.   Review of Systems:  No headache, visual changes, nausea, vomiting, diarrhea, constipation, dizziness, abdominal pain, skin rash, fevers, chills, night sweats, weight loss, swollen lymph nodes, body aches, joint swelling, muscle aches, chest pain, shortness of breath, mood changes.   Objective  There were no vitals taken for this visit. Systems examined below as of    General: No apparent distress alert and oriented x3 mood and affect normal, dressed appropriately.  HEENT: Pupils equal, extraocular movements intact  Respiratory: Patient's speak in full sentences and does not appear short of breath  Cardiovascular: No lower extremity edema, non tender, no erythema  Skin: Warm dry intact with no signs of infection or rash on extremities or on axial skeleton.  Abdomen: Soft nontender  Neuro: Cranial nerves II through XII are intact, neurovascularly intact in all extremities with 2+ DTRs and 2+ pulses.  Lymph: No lymphadenopathy of posterior or anterior  cervical chain or axillae bilaterally.  Gait normal with good balance and coordination.  MSK:  Non tender with full range of motion and good stability and symmetric strength and tone of shoulders, elbows, wrist, hip, knee and ankles bilaterally.     Impression and Recommendations:     This case required medical decision making of moderate complexity. The above documentation has been reviewed and is accurate and complete Lyndal Pulley, DO       Note: This dictation was prepared with Dragon dictation along with smaller phrase technology. Any transcriptional errors that result from this process are unintentional.

## 2019-03-20 ENCOUNTER — Ambulatory Visit: Payer: Medicare Other | Admitting: Family Medicine

## 2019-03-20 DIAGNOSIS — Z0289 Encounter for other administrative examinations: Secondary | ICD-10-CM

## 2019-03-28 ENCOUNTER — Emergency Department (HOSPITAL_BASED_OUTPATIENT_CLINIC_OR_DEPARTMENT_OTHER)
Admission: EM | Admit: 2019-03-28 | Discharge: 2019-03-29 | Disposition: A | Discharge status: Home / Self Care | Payer: Medicare Other | Attending: Emergency Medicine | Admitting: Emergency Medicine

## 2019-03-28 ENCOUNTER — Other Ambulatory Visit: Payer: Self-pay

## 2019-03-28 ENCOUNTER — Emergency Department (HOSPITAL_BASED_OUTPATIENT_CLINIC_OR_DEPARTMENT_OTHER): Payer: Medicare Other

## 2019-03-28 DIAGNOSIS — Z950 Presence of cardiac pacemaker: Secondary | ICD-10-CM | POA: Insufficient documentation

## 2019-03-28 DIAGNOSIS — Z79899 Other long term (current) drug therapy: Secondary | ICD-10-CM | POA: Insufficient documentation

## 2019-03-28 DIAGNOSIS — E119 Type 2 diabetes mellitus without complications: Secondary | ICD-10-CM | POA: Diagnosis not present

## 2019-03-28 DIAGNOSIS — N200 Calculus of kidney: Secondary | ICD-10-CM | POA: Diagnosis not present

## 2019-03-28 DIAGNOSIS — I10 Essential (primary) hypertension: Secondary | ICD-10-CM | POA: Diagnosis not present

## 2019-03-28 DIAGNOSIS — Z7984 Long term (current) use of oral hypoglycemic drugs: Secondary | ICD-10-CM | POA: Insufficient documentation

## 2019-03-28 DIAGNOSIS — Z7982 Long term (current) use of aspirin: Secondary | ICD-10-CM | POA: Diagnosis not present

## 2019-03-28 DIAGNOSIS — R109 Unspecified abdominal pain: Secondary | ICD-10-CM | POA: Insufficient documentation

## 2019-03-28 LAB — URINALYSIS, ROUTINE W REFLEX MICROSCOPIC
Bilirubin Urine: NEGATIVE
Glucose, UA: NEGATIVE mg/dL
Ketones, ur: NEGATIVE mg/dL
Leukocytes,Ua: NEGATIVE
Nitrite: NEGATIVE
Protein, ur: NEGATIVE mg/dL
Specific Gravity, Urine: 1.01 (ref 1.005–1.030)
pH: 6 (ref 5.0–8.0)

## 2019-03-28 LAB — URINALYSIS, MICROSCOPIC (REFLEX): WBC, UA: NONE SEEN WBC/hpf (ref 0–5)

## 2019-03-28 MED ORDER — KETOROLAC TROMETHAMINE 60 MG/2ML IM SOLN
30.0000 mg | Freq: Once | INTRAMUSCULAR | Status: AC
  Administered 2019-03-28: 30 mg via INTRAMUSCULAR
  Filled 2019-03-28: qty 2

## 2019-03-28 NOTE — ED Provider Notes (Signed)
[facility] Provider Note  CSN: [id number] Arrival date & time: 03/28/19 1940  Chief Complaint(s) Flank Pain  HPI Michael Snyder is a 68 y.o. male   The history is provided by the patient.  Flank Pain This is a new problem. The current episode started 12 to 24 hours ago. The problem occurs constantly. The problem has not changed since onset.Pertinent negatives include no chest pain, no headaches and no shortness of breath. The symptoms are aggravated by twisting. Relieved by: lying still. He has tried nothing for the symptoms.   Patient reports that he was doing some heavy lifting earlier today but nothing out of the ordinary.  Denies any history of known renal stones.  No associated nausea, vomiting.  No urinary symptoms.  No abdominal pain.  Denies any other physical complaints.  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   Pacemaker    Medtronic Adapta ADDR01   Seizure disorder ([place])    Seizures ([place])    last time in 1990's   Patient Active Problem List   Diagnosis Date Noted   De Quervain's tenosynovitis, left 03/14/2019   Left wrist pain 09/23/2018   Drug reaction 06/11/2018   Left facial swelling 05/13/2018   Hepatitis C virus infection cured after antiviral drug therapy 44/81/8563   Complications, pacemaker cardiac, 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/2014   Atrial fibrillation ([place]) 11/17/2012   Cardiac pacemaker MDT 11/26/2011   Routine health maintenance 06/16/2011   Essential hypertension 06/01/2008   Diabetes mellitus type 2, controlled, without complications ([place]) 14/97/0263   SICK SINUS SYNDROME 03/26/2007   SEIZURE DISORDER 03/26/2007   Home Medication(s) Prior to Admission medications   Medication Sig Start  Date End Date Taking? Authorizing Provider  ALPHAGAN P 0.1 % SOLN Place 2 drops into the left eye daily. 03/03/14   [provider]  amLODipine (NORVASC) 5 MG tablet Take 1 tablet (5 mg total) by mouth daily. 05/18/18   Hoyt Koch, MD  aspirin EC 81 MG tablet Take 81 mg daily by mouth.    [provider]  Blood Glucose Monitoring Suppl (ONETOUCH VERIO IQ SYSTEM) w/Device KIT USE TO CHECK BLOOD SUGARS  TWICE A DAY 12/30/17   Hoyt Koch, MD  Calcium Carbonate-Vitamin D (CALTRATE 600+D) 600-400 MG-UNIT per tablet Take 1 tablet by mouth 2 (two) times daily. 07/12/13   Norins, Heinz Knuckles, MD  diclofenac (VOLTAREN) 75 MG EC tablet Take 1 tablet (75 mg total) by mouth 2 (two) times daily. 02/16/19   Lyndal Pulley, DO  gabapentin (NEURONTIN) 300 MG capsule Take 300 mg by mouth every 6 (six) hours as needed (pain).  07/12/13   Norins, Heinz Knuckles, MD  glipiZIDE (GLUCOTROL) 5 MG tablet TAKE 1 TABLET BY MOUTH  TWICE A DAY BEFORE MEALS 06/20/18   Hoyt Koch, MD  hydrochlorothiazide (MICROZIDE) 12.5 MG capsule Take 1 capsule (12.5 mg total) by mouth daily. 05/18/18   Hoyt Koch, MD  ibuprofen (ADVIL) 600 MG tablet Take 1 tablet (600 mg total) by mouth every 6 (six) hours as needed. 03/29/19   Elick Aguilera, Grayce Sessions, MD  Lancets (ONETOUCH DELICA PLUS [id number]) [place] TWO TIMES DAILY 06/20/18   Sharlet Salina,  Real Cons, MD  Multiple Vitamin (MULTIVITAMIN WITH MINERALS) TABS tablet Take 1 tablet by mouth daily.    [provider]  Omega-3 Fatty Acids (FISH OIL) 300 MG CAPS Take 1 capsule (300 mg total) by mouth daily. 07/12/13   Norins, Heinz Knuckles, MD  [facility] VERIO test strip TEST TWO TIMES DAILY 06/20/18   Hoyt Koch, MD  pioglitazone (ACTOS) 30 MG tablet Take 1 tablet (30 mg total) by mouth daily. Need office visit for further refills 02/27/19   Hoyt Koch, MD  potassium chloride SA (K-DUR,KLOR-CON) 20 MEQ tablet TAKE  1 TABLET BY MOUTH TWO  TIMES DAILY 06/20/18   Hoyt Koch, MD  triamcinolone ointment (KENALOG) 0.5 % APPLY  OINTMENT TOPICALLY TWICE DAILY 10/31/18   Hoyt Koch, MD                                                                                                                                    Past Surgical History Past Surgical History:  Procedure Laterality Date   LACERATION REPAIR  1980   scalp   PACEMAKER LEAD REMOVAL N/A 03/23/2014   Procedure: PACEMAKER LEAD REMOVAL;  Surgeon: Evans Lance, MD;  Location: [facility];  Service: Cardiovascular;  Laterality: N/A;   PTDVP     7/08 medtronic DDD   Family History Family History  Problem Relation Age of Onset   Hypertension Mother    COPD Mother    Hypertension Brother    Cancer Brother        leukemia   Hypertension Brother     Social History Social History   Tobacco Use   Smoking status: Never Smoker   Smokeless tobacco: Never Used  Substance Use Topics   Alcohol use: No    Comment: quit 20 yrs ago   Drug use: No   Allergies Clindamycin/lincomycin, Ace inhibitors, and Hctz [hydrochlorothiazide]  Review of Systems Review of Systems  Constitutional: Negative for chills, fatigue and fever.  Respiratory: Negative for cough and shortness of breath.   Cardiovascular: Negative for chest pain.  Gastrointestinal: Negative for constipation, diarrhea, nausea and vomiting.  Genitourinary: Positive for flank pain.  Musculoskeletal: Negative for back pain.  Neurological: Negative for light-headedness and headaches.   All other systems are reviewed and are negative for acute change except as noted in the HPI  Physical Exam Vital Signs  I have reviewed the triage vital signs BP (!) 172/110 (BP Location: Right Arm)    Pulse 81    Temp 98.2 F (36.8 C) (Oral)    Resp 16    Ht 6' (1.829 m)    Wt 82.6 kg    SpO2 99%    BMI 24.68 kg/m   Physical Exam Vitals signs reviewed.  Constitutional:       General: He is not in acute distress.    Appearance: He is well-developed. He is not diaphoretic.  HENT:  Head: Normocephalic and atraumatic.     Jaw: No trismus.     Right Ear: External ear normal.     Left Ear: External ear normal.     Nose: Nose normal.  Eyes:     General: No scleral icterus.    Conjunctiva/sclera: Conjunctivae normal.  Neck:     Musculoskeletal: Normal range of motion.     Trachea: Phonation normal.  Cardiovascular:     Rate and Rhythm: Normal rate and regular rhythm.  Pulmonary:     Effort: Pulmonary effort is normal. No respiratory distress.     Breath sounds: No stridor.  Abdominal:     General: There is no distension.     Tenderness: There is no abdominal tenderness. There is no guarding or rebound. Negative signs include Murphy's sign.  Musculoskeletal: Normal range of motion.     Thoracic back: He exhibits no tenderness and no bony tenderness.     Lumbar back: He exhibits no tenderness and no bony tenderness.  Neurological:     Mental Status: He is alert and oriented to person, place, and time.  Psychiatric:        Behavior: Behavior normal.     ED Results and Treatments Labs (all labs ordered are listed, but only abnormal results are displayed) Labs Reviewed  URINALYSIS, ROUTINE W REFLEX MICROSCOPIC - Abnormal; Notable for the following components:      Result Value   Hgb urine dipstick TRACE (*)    All other components within normal limits  URINALYSIS, MICROSCOPIC (REFLEX) - Abnormal; Notable for the following components:   Bacteria, UA RARE (*)    All other components within normal limits                                                                                                                         EKG  EKG Interpretation  Date/Time:    Ventricular Rate:    PR Interval:    QRS Duration:   QT Interval:    QTC Calculation:   R Axis:     Text Interpretation:        Radiology Ct Renal Stone Study  Result Date:  03/28/2019 CLINICAL DATA:  68 year old male with right-sided flank pain. Concern for kidney stone. EXAM: CT ABDOMEN AND PELVIS WITHOUT CONTRAST TECHNIQUE: Multidetector CT imaging of the abdomen and pelvis was performed following the standard protocol without IV contrast. COMPARISON:  Abdominal ultrasound dated 03/17/2016 FINDINGS: Evaluation of this exam is limited in the absence of intravenous contrast. Lower chest: Bibasilar atelectatic changes. Patchy area of density at the right lung base, likely atelectasis. Developing infiltrate is not excluded. Clinical correlation is recommended. There is a trace right pleural effusion versus pleural thickening. Mild cardiomegaly. Cardiac pacemaker wires noted. No intra-abdominal free air or free fluid. Hepatobiliary: The liver is grossly unremarkable. No intrahepatic biliary ductal dilatation. Multiple small stones noted within the gallbladder. No pericholecystic fluid or evidence of acute cholecystitis by CT. Pancreas: Fatty atrophy of  the pancreas. There is coarse calcification of the proximal main pancreatic duct as well as the dorsal duct. There is dilatation of the main pancreatic duct measuring up to 9 mm in diameter. MRCP may provide better evaluation on a nonemergent basis. Spleen: Normal in size without focal abnormality. Adrenals/Urinary Tract: The adrenal glands are unremarkable. Several bilateral renal hypodense lesions measure up to 5 cm in the upper pole of the left kidney. These lesions are suboptimally characterized on this noncontrast CT but demonstrate fluid attenuation most consistent with cysts. Several bilateral nonobstructing renal calculi noted measure up to 7-8 mm in the inferior pole of the left kidney. There is no hydronephrosis on either side. The visualized ureters and urinary bladder appear unremarkable. Stomach/Bowel: Moderate amount stool noted throughout the colon. There is no bowel obstruction or active inflammation. The appendix is normal.  Vascular/Lymphatic: Mild aortoiliac atherosclerotic disease. The IVC is unremarkable. No portal venous gas. There is no adenopathy. Reproductive: The prostate and seminal vesicles are grossly unremarkable. No pelvic mass. Other: Small fat containing umbilical hernia. Musculoskeletal: Degenerative changes of the spine. No acute osseous pathology. IMPRESSION: 1. Bilateral nonobstructing renal calculi.  No hydronephrosis. 2. Cholelithiasis without evidence of acute cholecystitis by CT. 3. Coarse calcification of the proximal pancreatic duct with associated dilatation of the main pancreatic duct measuring up to 9 mm. There is fatty atrophy of the pancreas. 4. Constipation. No bowel obstruction or active inflammation. Normal appendix. Aortic Atherosclerosis (ICD10-I70.0). Electronically Signed   By: Anner Crete M.D.   On: 03/28/2019 23:43    Pertinent labs & imaging results that were available during my care of the patient were reviewed by me and considered in my medical decision making (see chart for details).  Medications Ordered in ED Medications  ketorolac (TORADOL) injection 30 mg (30 mg Intramuscular Given 03/28/19 2326)                                                                                                                                    Procedures Procedures  (including critical care time)  Medical Decision Making / ED Course I have reviewed the nursing notes for this encounter and the patient's prior records (if available in EHR or on provided paperwork).   Michael Snyder was evaluated in Emergency Department on 03/29/2019 for the symptoms described in the history of present illness. He was evaluated in the context of the global COVID-19 pandemic, which necessitated consideration that the patient might be at risk for infection with the SARS-CoV-2 virus that causes COVID-19. Institutional protocols and algorithms that pertain to the evaluation of patients at risk for COVID-19 are in  a state of rapid change based on information released by regulatory bodies including the CDC and federal and state organizations. These policies and algorithms were followed during the patient's care in the ED.  Right flank pain.  Nontender to palpation but is reproducible with movement.  Likely MSK.  UA does have trace hematuria.  CT scan notable for bilateral nonobstructing renal stones.  Treated with Toradol providing significant relief.  Recommend short coarse of NSAIDs.  The patient appears reasonably screened and/or stabilized for discharge and I doubt any other medical condition or other [facility] requiring further screening, evaluation, or treatment in the ED at this time prior to discharge.  The patient is safe for discharge with strict return precautions.       Final Clinical Impression(s) / ED Diagnoses Final diagnoses:  Flank pain     The patient appears reasonably screened and/or stabilized for discharge and I doubt any other medical condition or other [facility] requiring further screening, evaluation, or treatment in the ED at this time prior to discharge.  Disposition: Discharge  Condition: Good  I have discussed the results, Dx and Tx plan with the patient who expressed understanding and agree(s) with the plan. Discharge instructions discussed at great length. The patient was given strict return precautions who verbalized understanding of the instructions. No further questions at time of discharge.    ED Discharge Orders         Ordered    ibuprofen (ADVIL) 600 MG tablet  Every 6 hours PRN     03/29/19 0052           Follow Up: Hoyt Koch, MD [address] [place] [address] [phone]  Schedule an appointment as soon as possible for a visit  If symptoms do not improve or  worsen     This chart was dictated using voice recognition software.  Despite best efforts to proofread,  errors can occur which can change the documentation meaning.     Fatima Blank, MD 03/29/19 [phone]

## 2019-03-28 NOTE — ED Triage Notes (Signed)
Michael Snyder began having cramping and catching right sided pain that does not radiate. 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 He states nothing makes it better. NOthing makes it worse.

## 2019-03-29 MED ORDER — IBUPROFEN 600 MG PO TABS: 600.0000 mg | ORAL_TABLET | Freq: Four times a day (QID) | ORAL | 0 refills | Status: DC | PRN

## 2019-03-29 NOTE — Discharge Instructions (Signed)
Your pain may be muscular or due to possibly passing a kidney stone.  For muscle pain: You may use over-the-counter Motrin (Ibuprofen), Acetaminophen (Tylenol), topical muscle creams such as SalonPas, [employer], Bengay, etc. Please stretch, apply heat, and have massage therapy for additional assistance.   For kidney stone: You can take a short course of Motrin which will help with the pain.

## 2019-04-07 ENCOUNTER — Other Ambulatory Visit: Payer: Self-pay | Admitting: Internal Medicine

## 2019-04-10 ENCOUNTER — Other Ambulatory Visit: Payer: Self-pay

## 2019-04-11 MED ORDER — TRIAMCINOLONE ACETONIDE 0.5 % EX OINT: TOPICAL_OINTMENT | CUTANEOUS | 0 refills | Status: DC

## 2019-04-20 ENCOUNTER — Ambulatory Visit (INDEPENDENT_AMBULATORY_CARE_PROVIDER_SITE_OTHER): Payer: Medicare Other | Admitting: Internal Medicine

## 2019-04-20 ENCOUNTER — Other Ambulatory Visit: Payer: Self-pay

## 2019-04-20 ENCOUNTER — Encounter: Payer: Self-pay | Admitting: Internal Medicine

## 2019-04-20 ENCOUNTER — Other Ambulatory Visit (INDEPENDENT_AMBULATORY_CARE_PROVIDER_SITE_OTHER): Payer: Medicare Other

## 2019-04-20 VITALS — BP 140/98 | HR 83 | Temp 97.5°F | Ht 72.0 in | Wt 180.0 lb

## 2019-04-20 DIAGNOSIS — E119 Type 2 diabetes mellitus without complications: Secondary | ICD-10-CM

## 2019-04-20 DIAGNOSIS — I10 Essential (primary) hypertension: Secondary | ICD-10-CM | POA: Diagnosis not present

## 2019-04-20 DIAGNOSIS — Z23 Encounter for immunization: Secondary | ICD-10-CM

## 2019-04-20 LAB — MICROALBUMIN / CREATININE URINE RATIO
Creatinine,U: 55 mg/dL
Microalb Creat Ratio: 9.1 mg/g (ref 0.0–30.0)
Microalb, Ur: 5 mg/dL — ABNORMAL HIGH (ref 0.0–1.9)

## 2019-04-20 LAB — COMPREHENSIVE METABOLIC PANEL
ALT: 22 U/L (ref 0–53)
AST: 29 U/L (ref 0–37)
Albumin: 4.4 g/dL (ref 3.5–5.2)
Alkaline Phosphatase: 87 U/L (ref 39–117)
BUN: 13 mg/dL (ref 6–23)
CO2: 27 mEq/L (ref 19–32)
Calcium: 9.3 mg/dL (ref 8.4–10.5)
Chloride: 100 mEq/L (ref 96–112)
Creatinine, Ser: 1.29 mg/dL (ref 0.40–1.50)
GFR: 66.7 mL/min (ref >60.00)
Glucose, Bld: 385 mg/dL — ABNORMAL HIGH (ref 70–99)
Potassium: 3.7 mEq/L (ref 3.5–5.1)
Sodium: 136 mEq/L (ref 135–145)
Total Bilirubin: 0.9 mg/dL (ref 0.2–1.2)
Total Protein: 7.1 g/dL (ref 6.0–8.3)

## 2019-04-20 LAB — CBC
HCT: 40.5 % (ref 39.0–52.0)
Hemoglobin: 13.4 g/dL (ref 13.0–17.0)
MCHC: 33.1 g/dL (ref 30.0–36.0)
MCV: 89.4 fl (ref 78.0–100.0)
Platelets: 154 10*3/uL (ref 150.0–400.0)
RBC: 4.53 Mil/uL (ref 4.22–5.81)
RDW: 13.4 % (ref 11.5–15.5)
WBC: 6.1 10*3/uL (ref 4.0–10.5)

## 2019-04-20 LAB — HEMOGLOBIN A1C: Hgb A1c MFr Bld: 10.6 % — ABNORMAL HIGH (ref 4.6–6.5)

## 2019-04-20 LAB — LIPID PANEL
Cholesterol: 187 mg/dL (ref 0–200)
HDL: 46.2 mg/dL (ref >39.00)
NonHDL: 141.13
Total CHOL/HDL Ratio: 4
Triglycerides: 209 mg/dL — ABNORMAL HIGH (ref 0.0–149.0)
VLDL: 41.8 mg/dL — ABNORMAL HIGH (ref 0.0–40.0)

## 2019-04-20 LAB — LDL CHOLESTEROL, DIRECT: Direct LDL: 102 mg/dL

## 2019-04-20 MED ORDER — BLOOD PRESSURE KIT DEVI: 1.0000 | Freq: Every day | 0 refills | Status: AC

## 2019-04-20 MED ORDER — HYDROCHLOROTHIAZIDE 25 MG PO TABS: 25.0000 mg | ORAL_TABLET | Freq: Every day | ORAL | 3 refills | Status: DC

## 2019-04-20 NOTE — Assessment & Plan Note (Signed)
BP is above goal. Rx hctz 25 mg daily and keep amlodipine 5 mg daily. Due for physical later this month and will recheck then.

## 2019-04-20 NOTE — Progress Notes (Signed)
   Subjective:   Patient ID: Michael Snyder, male    DOB: [birth year], 68 y.o.   MRN: [id number]  HPI The patient is a 68 YO man coming in for concerns about blood pressure. He admits to being out of hctz for "several weeks" as he lost the bottle. 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 Previously borderline on his hctz 12.5 and amlodipine 5 mg lately with readings 140/100 and 140/90 routinely. Given his diabetes and htn needs readings closer to 130/80. Denies side effects to medications. Denies headaches or chest pain or pressure. Denies nausea or vomiting. Has been eating some more salt lately as well. Needs follow up of his diabetes also.   Review of Systems  Constitutional: Negative.   HENT: Negative.   Eyes: Negative.   Respiratory: Negative for cough, chest tightness and shortness of breath.   Cardiovascular: Negative for chest pain, palpitations and leg swelling.  Gastrointestinal: Negative for abdominal distention, abdominal pain, constipation, diarrhea, nausea and vomiting.  Musculoskeletal: Positive for arthralgias and myalgias.  Skin: Negative.   Neurological: Negative.   Psychiatric/Behavioral: Negative.     Objective:  Physical Exam Constitutional:      Appearance: He is well-developed.  HENT:     Head: Normocephalic and atraumatic.  Neck:     Musculoskeletal: Normal range of motion.  Cardiovascular:     Rate and Rhythm: Normal rate and regular rhythm.  Pulmonary:     Effort: Pulmonary effort is normal. No respiratory distress.     Breath sounds: Normal breath sounds. No wheezing or rales.  Abdominal:     General: Bowel sounds are normal. There is no distension.     Palpations: Abdomen is soft.     Tenderness: There is no abdominal tenderness. There is no rebound.  Musculoskeletal:        General: Tenderness present.     Comments: Left wrist pain  Skin:    General: Skin is warm and dry.  Neurological:     Mental Status: He is alert and  oriented to person, place, and time.     Coordination: Coordination normal.     Vitals:   04/20/19 0822  BP: (!) 142/100  Pulse: 83  Temp: (!) 97.5 F (36.4 C)  TempSrc: Oral  SpO2: 99%  Weight: 180 lb (81.6 kg)  Height: 6' (1.829 m)   Visit time 25 minutes: greater than 50% of that time was spent in face to face counseling and coordination of care with the patient: counseled about diabetes and hypertension as well as medication compliance to help with overall health  Assessment & Plan:  Flu shot given at visit

## 2019-04-20 NOTE — Patient Instructions (Signed)
We have sent in the hydrochlorothiazide to start taking again 1 pill daily.   DASH Eating Plan DASH stands for "Dietary Approaches to Stop Hypertension." The DASH eating plan is a healthy eating plan that has been shown to reduce high blood pressure (hypertension). It may also reduce your risk for type 2 diabetes, heart disease, and stroke. The DASH eating plan may also help with weight loss. What are tips for following this plan?  General guidelines  Avoid eating more than 2,300 mg (milligrams) of salt (sodium) a day. If you have hypertension, you may need to reduce your sodium intake to 1,500 mg a day.  Limit alcohol intake to no more than 1 drink a day for nonpregnant women and 2 drinks a day for men. One drink equals 12 oz of beer, 5 oz of wine, or 1 oz of hard liquor.  Work with your health care provider to maintain a healthy body weight or to lose weight. Ask what an ideal weight is for you.  Get at least 30 minutes of exercise that causes your heart to beat faster (aerobic exercise) most days of the week. Activities may include walking, swimming, or biking.  Work with your health care provider or diet and nutrition specialist (dietitian) to adjust your eating plan to your individual calorie needs. Reading food labels   Check food labels for the amount of sodium per serving. Choose foods with less than 5 percent of the Daily Value of sodium. Generally, foods with less than 300 mg of sodium per serving fit into this eating plan.  To find whole grains, look for the word "whole" as the first word in the ingredient list. Shopping  Buy products labeled as "low-sodium" or "no salt added."  Buy fresh foods. Avoid canned foods and premade or frozen meals. Cooking  Avoid adding salt when cooking. Use salt-free seasonings or herbs instead of table salt or sea salt. Check with your health care provider or pharmacist before using salt substitutes.  Do not fry foods. Cook foods using  healthy methods such as baking, boiling, grilling, and broiling instead.  Cook with heart-healthy oils, such as olive, canola, soybean, or sunflower oil. Meal planning  Eat a balanced diet that includes: ? 5 or more servings of fruits and vegetables each day. At each meal, try to fill half of your plate with fruits and vegetables. ? Up to 6-8 servings of whole grains each day. ? Less than 6 oz of lean meat, poultry, or fish each day. A 3-oz serving of meat is about the same size as a deck of cards. One egg equals 1 oz. ? 2 servings of low-fat dairy each day. ? A serving of nuts, seeds, or beans 5 times each week. ? Heart-healthy fats. Healthy fats called Omega-3 fatty acids are found in foods such as flaxseeds and coldwater fish, like sardines, salmon, and mackerel.  Limit how much you eat of the following: ? Canned or prepackaged foods. ? Food that is high in trans fat, such as fried foods. ? Food that is high in saturated fat, such as fatty meat. ? Sweets, desserts, sugary drinks, and other foods with added sugar. ? Full-fat dairy products.  Do not salt foods before eating.  Try to eat at least 2 vegetarian meals each week.  Eat more home-cooked food and less restaurant, buffet, and fast food.  When eating at a restaurant, ask that your food be prepared with less salt or no salt, if possible. What foods are  recommended? The items listed may not be a complete list. Talk with your dietitian about what dietary choices are best for you. Grains Whole-grain or whole-wheat bread. Whole-grain or whole-wheat pasta. Brown rice. [place]. Bulgur. Whole-grain and low-sodium cereals. Pita bread. Low-fat, low-sodium crackers. Whole-wheat flour tortillas. Vegetables Fresh or frozen vegetables (raw, steamed, roasted, or grilled). Low-sodium or reduced-sodium tomato and vegetable juice. Low-sodium or reduced-sodium tomato sauce and tomato paste. Low-sodium or reduced-sodium canned  vegetables. Fruits All fresh, dried, or frozen fruit. Canned fruit in natural juice (without added sugar). Meat and other protein foods Skinless chicken or [place]. Ground chicken or [place]. Pork with fat trimmed off. Fish and seafood. Egg whites. Dried beans, peas, or lentils. Unsalted nuts, nut butters, and seeds. Unsalted canned beans. Lean cuts of beef with fat trimmed off. Low-sodium, lean deli meat. Dairy Low-fat (1%) or fat-free (skim) milk. Fat-free, low-fat, or reduced-fat cheeses. Nonfat, low-sodium ricotta or cottage cheese. Low-fat or nonfat yogurt. Low-fat, low-sodium cheese. Fats and oils Soft margarine without trans fats. Vegetable oil. Low-fat, reduced-fat, or light mayonnaise and salad dressings (reduced-sodium). Canola, safflower, olive, soybean, and sunflower oils. Avocado. Seasoning and other foods Herbs. Spices. Seasoning mixes without salt. Unsalted popcorn and pretzels. Fat-free sweets. What foods are not recommended? The items listed may not be a complete list. Talk with your dietitian about what dietary choices are best for you. Grains Baked goods made with fat, such as croissants, muffins, or some breads. Dry pasta or rice meal packs. Vegetables Creamed or fried vegetables. Vegetables in a cheese sauce. Regular canned vegetables (not low-sodium or reduced-sodium). Regular canned tomato sauce and paste (not low-sodium or reduced-sodium). Regular tomato and vegetable juice (not low-sodium or reduced-sodium). Angie Fava. Olives. Fruits Canned fruit in a light or heavy syrup. Fried fruit. Fruit in cream or butter sauce. Meat and other protein foods Fatty cuts of meat. Ribs. Fried meat. Berniece Salines. Sausage. Bologna and other processed lunch meats. Salami. Fatback. Hotdogs. Bratwurst. Salted nuts and seeds. Canned beans with added salt. Canned or smoked fish. Whole eggs or egg yolks. Chicken or [place] with skin. Dairy Whole or 2% milk, cream, and half-and-half. Whole or full-fat  cream cheese. Whole-fat or sweetened yogurt. Full-fat cheese. Nondairy creamers. Whipped toppings. Processed cheese and cheese spreads. Fats and oils Butter. Stick margarine. Lard. Shortening. Ghee. Bacon fat. Tropical oils, such as coconut, palm kernel, or palm oil. Seasoning and other foods Salted popcorn and pretzels. Onion salt, garlic salt, seasoned salt, table salt, and sea salt. Worcestershire sauce. Tartar sauce. Barbecue sauce. Teriyaki sauce. Soy sauce, including reduced-sodium. Steak sauce. Canned and packaged gravies. Fish sauce. Oyster sauce. Cocktail sauce. Horseradish that you find on the shelf. Ketchup. Mustard. Meat flavorings and tenderizers. Bouillon cubes. Hot sauce and Tabasco sauce. Premade or packaged marinades. Premade or packaged taco seasonings. Relishes. Regular salad dressings. Where to find more information:  National Heart, [facility] [place]: https://wilson-eaton.com/  American Heart Association: www.heart.org Summary  The DASH eating plan is a healthy eating plan that has been shown to reduce high blood pressure (hypertension). It may also reduce your risk for type 2 diabetes, heart disease, and stroke.  With the DASH eating plan, you should limit salt (sodium) intake to 2,300 mg a day. If you have hypertension, you may need to reduce your sodium intake to 1,500 mg a day.  When on the DASH eating plan, aim to eat more fresh fruits and vegetables, whole grains, lean proteins, low-fat dairy, and heart-healthy fats.  Work with your health  care provider or diet and nutrition specialist (dietitian) to adjust your eating plan to your individual calorie needs. This information is not intended to replace advice given to you by your health care provider. Make sure you discuss any questions you have with your health care provider. Document Released: 06/11/2011 Document Revised: 06/04/2017 Document Reviewed: 06/15/2016 Elsevier Patient Education  2020 [employer].

## 2019-04-20 NOTE — Assessment & Plan Note (Signed)
Taking glipizide and actos. Due for HgA1c and microalbumin to creatinine ratio. Potential ACE-I angioedema last year so not on ACE-I/ARB. He is not on statin and checking lipid panel and likely needs to start this. Foot exam done and reminded about eye exam.

## 2019-04-25 ENCOUNTER — Other Ambulatory Visit: Payer: Self-pay | Admitting: Internal Medicine

## 2019-04-25 DIAGNOSIS — E119 Type 2 diabetes mellitus without complications: Secondary | ICD-10-CM

## 2019-04-25 MED ORDER — ATORVASTATIN CALCIUM 20 MG PO TABS: 20.0000 mg | ORAL_TABLET | Freq: Every day | ORAL | 3 refills | Status: DC

## 2019-04-27 ENCOUNTER — Other Ambulatory Visit: Payer: Self-pay | Admitting: Internal Medicine

## 2019-04-27 MED ORDER — TRESIBA FLEXTOUCH 100 UNIT/ML ~~LOC~~ SOPN: 10.0000 [IU] | PEN_INJECTOR | Freq: Every day | SUBCUTANEOUS | 3 refills | Status: DC

## 2019-04-28 ENCOUNTER — Telehealth: Payer: Self-pay | Admitting: Internal Medicine

## 2019-04-28 ENCOUNTER — Ambulatory Visit (INDEPENDENT_AMBULATORY_CARE_PROVIDER_SITE_OTHER): Payer: Medicare Other | Admitting: Internal Medicine

## 2019-04-28 ENCOUNTER — Encounter: Payer: Self-pay | Admitting: Internal Medicine

## 2019-04-28 ENCOUNTER — Other Ambulatory Visit: Payer: Self-pay

## 2019-04-28 VITALS — BP 126/88 | HR 72 | Ht 72.0 in | Wt 177.2 lb

## 2019-04-28 DIAGNOSIS — I480 Paroxysmal atrial fibrillation: Secondary | ICD-10-CM

## 2019-04-28 DIAGNOSIS — Z950 Presence of cardiac pacemaker: Secondary | ICD-10-CM | POA: Diagnosis not present

## 2019-04-28 DIAGNOSIS — I495 Sick sinus syndrome: Secondary | ICD-10-CM

## 2019-04-28 DIAGNOSIS — I10 Essential (primary) hypertension: Secondary | ICD-10-CM | POA: Diagnosis not present

## 2019-04-28 LAB — CUP PACEART INCLINIC DEVICE CHECK
Battery Impedance: 256 Ohm
Battery Remaining Longevity: 127 mo
Battery Voltage: 2.78 V
Brady Statistic AP VP Percent: 0 %
Brady Statistic AP VS Percent: 8 %
Brady Statistic AS VP Percent: 1 %
Brady Statistic AS VS Percent: 91 %
Date Time Interrogation Session: 20201023141258
Implantable Lead Implant Date: 20080711
Implantable Lead Implant Date: 20150918
Implantable Lead Location: 753859
Implantable Lead Location: 753860
Implantable Lead Model: 5076
Implantable Lead Model: 5076
Implantable Pulse Generator Implant Date: 20150918
Lead Channel Impedance Value: 472 Ohm
Lead Channel Impedance Value: 531 Ohm
Lead Channel Pacing Threshold Amplitude: 0.75 V
Lead Channel Pacing Threshold Amplitude: 0.75 V
Lead Channel Pacing Threshold Pulse Width: 0.4 ms
Lead Channel Pacing Threshold Pulse Width: 0.4 ms
Lead Channel Sensing Intrinsic Amplitude: 1.4 mV
Lead Channel Sensing Intrinsic Amplitude: 5.6 mV
Lead Channel Setting Pacing Amplitude: 2 V
Lead Channel Setting Pacing Amplitude: 2.5 V
Lead Channel Setting Pacing Pulse Width: 0.4 ms
Lead Channel Setting Sensing Sensitivity: 2 mV

## 2019-04-28 NOTE — Patient Instructions (Signed)

## 2019-04-28 NOTE — Telephone Encounter (Signed)
Placed in MD folder to sign  

## 2019-04-28 NOTE — Telephone Encounter (Signed)
Patient dropped off [employer] Blood Pressure Clearance Form.  Patient needs this form signed by Dr. Sharlet Salina to take back to [place] on 11/27.  You can give patient a call on cell once form is completed.   Placed in [place] box.

## 2019-04-28 NOTE — Progress Notes (Signed)
Patient Care Team: Hoyt Koch, MD as PCP - General (Internal Medicine)   HPI  Michael Snyder is a 68 y.o. male seen in followup for pacemaker implanted 2008 because of symptomatic sinus node dysfunction and profound nocturnal bradycardia not felt to be related to sleep apnea. He has had no further symptoms of lightheadedness following device implantation.     Evaluation of his left ventricular function by echo in 2008 demonstrated an EF of 55-65%.   Nonsustained ventricular tachycardia was noted on his device.    In 9/15 he underwent extraction of his previously implanted RV lead which become dislodged with  insertion of a new lead  And he has a history of a retinal event that was thought to be possibly thromboembolic. He had atrial high rate episodes detected on his device and the concern was that this represented atrial fibrillation. He was started on Rivaroxaban which he tolerated but has intercurrently stopped; ASA is his current choice    No interval Afib of which he is aware  The patient denies chest pain, shortness of breath, nocturnal dyspnea, orthopnea or peripheral edema.  There have been no palpitations, lightheadedness or syncope.    BS is high  May need insulin   Date Cr K  10/18 1.6 4.4   10/20 1.29 3.7     Past Medical History:  Diagnosis Date  . Alcohol abuse   . Arthritis   . Diabetes mellitus, type 2 ([place])   . Head injury, closed, without LOC 1980s  . History of alcohol abuse   . Hypertension   . 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 Pacemaker    [place]  . Seizure disorder ([place])   . Seizures ([place])    last time in 1990's    Past Surgical History:  Procedure Laterality Date  . LACERATION REPAIR  1980   scalp  . PACEMAKER LEAD REMOVAL N/A 03/23/2014   Procedure: PACEMAKER LEAD REMOVAL;  Surgeon: Evans Lance, MD;  Location: [facility];  Service: Cardiovascular;  Laterality: N/A;  . 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 ALPHAGAN P 0.1 % SOLN Place 2 drops into the left eye daily.    [name] amLODipine (NORVASC) 5 MG tablet Take 1 tablet (5 mg total) by mouth daily. 90 tablet 1  . aspirin EC 81 MG tablet Take 81 mg daily by mouth.    [name] atorvastatin (LIPITOR) 20 MG tablet Take 1 tablet (20 mg total) by mouth daily. 90 tablet 3  . Blood Glucose Monitoring Suppl (ONETOUCH VERIO IQ SYSTEM) w/Device KIT USE TO CHECK BLOOD SUGARS  TWICE A DAY 1 kit 0  . Blood Pressure Monitoring (BLOOD PRESSURE KIT) DEVI 1 kit by Does not apply route daily. 1 each 0  . Calcium Carbonate-Vitamin D (CALTRATE 600+D) 600-400 MG-UNIT per tablet Take 1 tablet by mouth 2 (two) times daily. 180 tablet 3  . diclofenac (VOLTAREN) 75 MG EC tablet Take 1 tablet (75 mg total) by mouth 2 (two) times daily. 60 tablet 0  . gabapentin (NEURONTIN) 300 MG capsule Take 300 mg by mouth every 6 (six) hours as needed (pain).     [name] glipiZIDE (GLUCOTROL) 5 MG tablet Take 1 tablet (5 mg total) by mouth 2 (two) times daily before a meal. Due for annual visit with labs 180 tablet 0  . hydrochlorothiazide (  HYDRODIURIL) 25 MG tablet Take 1 tablet (25 mg total) by mouth daily. 90 tablet 3  . ibuprofen (ADVIL) 600 MG tablet Take 1 tablet (600 mg total) by mouth every 6 (six) hours as needed. 30 tablet 0  . insulin degludec (TRESIBA FLEXTOUCH) 100 UNIT/ML SOPN FlexTouch Pen Inject 0.1-0.25 mLs (10-25 Units total) into the skin daily. 4 pen 3  . Lancets (ONETOUCH DELICA PLUS LANCET33G) MISC CHECK BLOOD SUGAR TWO TIMES DAILY 200 each 11  . Multiple Vitamin (MULTIVITAMIN WITH MINERALS) TABS tablet Take 1 tablet by mouth daily.    . Omega-3 Fatty Acids (FISH OIL) 300 MG CAPS Take 1 capsule (300 mg total) by mouth daily. 90 capsule 3  . ONETOUCH VERIO test strip TEST TWO TIMES DAILY 200 strip 0  . pioglitazone (ACTOS) 30 MG tablet Take 1 tablet (30 mg total) by mouth daily. Due for annual visit with labs 90  tablet 0  . potassium chloride SA (KLOR-CON) 20 MEQ tablet Take 1 tablet (20 mEq total) by mouth 2 (two) times daily. Due for annual visit with labs 180 tablet 0  . triamcinolone ointment (KENALOG) 0.5 % APPLY  OINTMENT TOPICALLY TWICE DAILY 90 g 0   No current facility-administered medications for this visit.     Allergies  Allergen Reactions  . Clindamycin/Lincomycin Swelling    Possible facial and lip swelling  . Ace Inhibitors     Facial and lip swelling possibly associated    Review of Systems negative except from HPI and PMH  Physical Exam BP 126/88   Pulse 72   Ht 6' (1.829 m)   Wt 177 lb 3.2 oz (80.4 kg)   SpO2 99%   BMI 24.03 kg/m  Well developed and well nourished in no acute distress HENT normal Neck supple with JVP-flat Clear Device pocket well healed; without hematoma or erythema.  There is no tethering  Regular rate and rhythm, no  murmur Abd-soft with active BS No Clubbing cyanosis   edema Skin-warm and dry A & Oriented  Grossly normal sensory and motor function  ECG sinus @ 72 29/07/36   Assessment and  Plan  Sinus bradycardia  1 AV block stable  TIA  No intercurrent events or detected atrial fibrillation   Pacemaker-Medtronic  The patient's device was interrogated.  The information was reviewed. No changes were made in the programming.    Hypertension  Ventricular tachycardia nonsustained  BP well controlled        Stable continue current meds  

## 2019-05-01 NOTE — Telephone Encounter (Signed)
Will call patient once this form is completed

## 2019-05-01 NOTE — Telephone Encounter (Signed)
Patient calling to check status of form being filled out. Advised that provider has not had a chance to complete yet. Once done, someone would call to inform him.

## 2019-05-02 NOTE — Telephone Encounter (Signed)
Tried calling patient but the phone keeps just beeping in my ear and hanging up.. Dr. Sharlet Salina states she already signed this form for him at his visit and was not sure if he was wanting it to go to his heart doctor because at that appointment his blood pressure was in normal range. At our visit it was not so we can only sign for the BP reading we got in out office. May want to take for the cardiologist to sign because BP reading was better.

## 2019-05-17 ENCOUNTER — Ambulatory Visit (INDEPENDENT_AMBULATORY_CARE_PROVIDER_SITE_OTHER): Payer: Medicare Other | Admitting: Internal Medicine

## 2019-05-17 ENCOUNTER — Other Ambulatory Visit: Payer: Self-pay

## 2019-05-17 ENCOUNTER — Other Ambulatory Visit (INDEPENDENT_AMBULATORY_CARE_PROVIDER_SITE_OTHER): Payer: Medicare Other

## 2019-05-17 ENCOUNTER — Encounter: Payer: Self-pay | Admitting: Internal Medicine

## 2019-05-17 VITALS — BP 110/84 | HR 88 | Temp 97.9°F | Ht 72.0 in | Wt 180.0 lb

## 2019-05-17 DIAGNOSIS — I10 Essential (primary) hypertension: Secondary | ICD-10-CM | POA: Diagnosis not present

## 2019-05-17 LAB — COMPREHENSIVE METABOLIC PANEL
ALT: 33 U/L (ref 0–53)
AST: 33 U/L (ref 0–37)
Albumin: 4.2 g/dL (ref 3.5–5.2)
Alkaline Phosphatase: 73 U/L (ref 39–117)
BUN: 16 mg/dL (ref 6–23)
CO2: 23 mEq/L (ref 19–32)
Calcium: 8.8 mg/dL (ref 8.4–10.5)
Chloride: 107 mEq/L (ref 96–112)
Creatinine, Ser: 1.37 mg/dL (ref 0.40–1.50)
GFR: 62.21 mL/min (ref >60.00)
Glucose, Bld: 105 mg/dL — ABNORMAL HIGH (ref 70–99)
Potassium: 3.6 mEq/L (ref 3.5–5.1)
Sodium: 139 mEq/L (ref 135–145)
Total Bilirubin: 0.9 mg/dL (ref 0.2–1.2)
Total Protein: 6.6 g/dL (ref 6.0–8.3)

## 2019-05-17 NOTE — Patient Instructions (Signed)
We are checking the blood work today to make sure the medicine change did not affect the kidneys.

## 2019-05-17 NOTE — Assessment & Plan Note (Signed)
Checking CMP. BP is at goal on amlodipine and hctz 25 mg daily. Form signed for DOT.

## 2019-05-17 NOTE — Progress Notes (Signed)
   Subjective:   Patient ID: Michael Snyder, male    DOB: 10-Aug-1950, 68 y.o.   MRN: [id number]  HPI  The patient is a 68 YO man coming in for BP follow up. 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 He has been back on meds and doing well. Denies chest pains or headaches. Is feeling much better.  Using 18 units of the tresiba but not daily, just when sugars are running high. Mostly sugars are running better when he checks not in the 200s anymore.   Review of Systems  Constitutional: Negative.   HENT: Negative.   Eyes: Negative.   Respiratory: Negative for cough, chest tightness and shortness of breath.   Cardiovascular: Negative for chest pain, palpitations and leg swelling.  Gastrointestinal: Negative for abdominal distention, abdominal pain, constipation, diarrhea, nausea and vomiting.  Musculoskeletal: Negative.   Skin: Negative.   Neurological: Negative.   Psychiatric/Behavioral: Negative.     Objective:  Physical Exam Constitutional:      Appearance: He is well-developed.  HENT:     Head: Normocephalic and atraumatic.  Neck:     Musculoskeletal: Normal range of motion.  Cardiovascular:     Rate and Rhythm: Normal rate and regular rhythm.  Pulmonary:     Effort: Pulmonary effort is normal. No respiratory distress.     Breath sounds: Normal breath sounds. No wheezing or rales.  Abdominal:     General: Bowel sounds are normal. There is no distension.     Palpations: Abdomen is soft.     Tenderness: There is no abdominal tenderness. There is no rebound.  Skin:    General: Skin is warm and dry.  Neurological:     Mental Status: He is alert and oriented to person, place, and time.     Coordination: Coordination normal.     Vitals:   05/17/19 1026 05/17/19 1037  BP: 122/90 110/84  Pulse: 88   Temp: 97.9 F (36.6 C)   TempSrc: Oral   SpO2: 99%   Weight: 180 lb (81.6 kg)   Height: 6' (1.829 m)      Assessment & Plan:

## 2019-05-18 ENCOUNTER — Ambulatory Visit: Payer: Medicare Other | Admitting: Internal Medicine

## 2019-05-24 ENCOUNTER — Other Ambulatory Visit: Payer: Self-pay

## 2019-05-26 ENCOUNTER — Encounter: Payer: Self-pay | Admitting: Endocrinology

## 2019-05-26 ENCOUNTER — Other Ambulatory Visit: Payer: Self-pay

## 2019-05-26 ENCOUNTER — Ambulatory Visit (INDEPENDENT_AMBULATORY_CARE_PROVIDER_SITE_OTHER): Payer: Medicare Other | Admitting: Endocrinology

## 2019-05-26 VITALS — BP 134/88 | HR 90 | Ht 72.0 in | Wt 177.6 lb

## 2019-05-26 DIAGNOSIS — E119 Type 2 diabetes mellitus without complications: Secondary | ICD-10-CM | POA: Diagnosis not present

## 2019-05-26 MED ORDER — RYBELSUS 3 MG PO TABS: 3.0000 mg | ORAL_TABLET | Freq: Every day | ORAL | 11 refills | Status: DC

## 2019-05-26 MED ORDER — GLIPIZIDE 5 MG PO TABS: 2.5000 mg | ORAL_TABLET | Freq: Every day | ORAL | 0 refills | Status: DC

## 2019-05-26 NOTE — Patient Instructions (Addendum)
good diet and exercise significantly improve the control of your diabetes.  please let me know if you wish to be referred to a dietician.  high blood sugar is very risky to your health.  you should see an eye doctor and dentist every year.  It is very important to get all recommended vaccinations.  Controlling your blood pressure and cholesterol drastically reduces the damage diabetes does to your body.  Those who smoke should quit.  Please discuss these with your doctor.  check your blood sugar once a day.  vary the time of day when you check, between before the 3 meals, and at bedtime.  also check if you have symptoms of your blood sugar being too high or too low.  please keep a record of the readings and bring it to your next appointment here (or you can bring the meter itself).  You can write it on any piece of paper.  please call us sooner if your blood sugar goes below 70, or if you have a lot of readings over 200.  Please continue the same pioglitazone, and: You can stay off the insulin for now, and: I have sent a prescription to your pharmacy, to start "Rybelsus," and:  Reduce the glipizide to 1/2 pill per day.   Our goals are to keep the A1c less than 7, avoid low sugar, and to not need insulin.   Please come back for a follow-up appointment in 3-4 weeks.

## 2019-05-26 NOTE — Progress Notes (Signed)
Subjective:    Patient ID: Michael Snyder, male    DOB: 17-Aug-1950, 68 y.o.   MRN: [id number]  HPI pt is referred by Dr Sharlet Salina, for diabetes.  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  Last course of steroids was 1 month ago.  More recently, pt says cbg varies from 67-200. He takes 2 oral meds.   Past Medical History:  Diagnosis Date  . Alcohol abuse   . Arthritis   . Diabetes mellitus, type 2 ([place])   . Head injury, closed, without LOC 1980s  . History of alcohol abuse   . Hypertension   . 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 Pacemaker    [place]  . Seizure disorder ([place])   . Seizures ([place])    last time in 1990's    Past Surgical History:  Procedure Laterality Date  . LACERATION REPAIR  1980   scalp  . PACEMAKER LEAD REMOVAL N/A 03/23/2014   Procedure: PACEMAKER LEAD REMOVAL;  Surgeon: Evans Lance, MD;  Location: [place];  Service: Cardiovascular;  Laterality: N/A;  . PTDVP     7/08 medtronic DDD    Social History   Socioeconomic History  . Marital status: Legally Separated    Spouse name: Not on file  . Number of children: 3  . Years of education: Not on file  . Highest education level: Not on file  Occupational History  . Occupation: Games developer, truck Animator Needs  . Financial resource strain: Not hard at all  . Food insecurity    Worry: Never true    Inability: Never true  . Transportation needs    Medical: No    Non-medical: No  Tobacco Use  . Smoking status: Never Smoker  . Smokeless tobacco: Never Used  Substance and Sexual Activity  . Alcohol use: No    Comment: quit 20 yrs ago  . Drug use: No  . Sexual activity: Yes     Partners: Female  Lifestyle  . Physical activity    Days per week: 5 days    Minutes per session: 80 min  . Stress: Not at all  Relationships  . Social connections    Talks on phone: More than three times a week    Gets together: More than three times a week    Attends religious service: More than 4 times per year    Active member of club or organization: Yes    Attends meetings of clubs or organizations: More than 4 times per year    Relationship status: Separated  . Intimate partner violence    Fear of current or ex partner: Not on file    Emotionally abused: Not on file    Physically abused: Not on file    Forced sexual activity: Not on file  Other Topics Concern  . Not on file  Social History Narrative   12th grade education. H/O substance abuse- now abstemious and works with other in recovery..Attends NA on a regular basis and serves as a sponsor. Married 18 years-divorced. Married 5 years 2nd marriage.3 daughters previous marriage, 9 grandchildren, 3 great-grands.  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 ALPHAGAN P 0.1 % SOLN Place 2 drops into the left eye daily.    [name] amLODipine (NORVASC) 5 MG tablet Take 1 tablet (5 mg total) by mouth daily. 90 tablet 1  . aspirin EC 81 MG tablet Take 81 mg daily by mouth.    [name] atorvastatin (LIPITOR) 20 MG tablet Take 1 tablet (20 mg total) by mouth daily. 90 tablet 3  . Blood Glucose Monitoring Suppl (ONETOUCH VERIO IQ SYSTEM) w/Device KIT USE TO CHECK BLOOD SUGARS  TWICE A DAY 1 kit 0  . Blood Pressure Monitoring (BLOOD PRESSURE KIT) DEVI 1 kit by Does not apply route daily. 1 each 0  . Calcium Carbonate-Vitamin D (CALTRATE 600+D) 600-400 MG-UNIT per tablet Take 1 tablet by mouth 2 (two) times daily. 180 tablet 3  . diclofenac (VOLTAREN) 75 MG EC tablet Take 1 tablet (75 mg total) by mouth 2 (two) times daily. 60 tablet 0  . gabapentin  (NEURONTIN) 300 MG capsule Take 300 mg by mouth every 6 (six) hours as needed (pain).     . hydrochlorothiazide (HYDRODIURIL) 25 MG tablet Take 1 tablet (25 mg total) by mouth daily. 90 tablet 3  . ibuprofen (ADVIL) 600 MG tablet Take 1 tablet (600 mg total) by mouth every 6 (six) hours as needed. 30 tablet 0  . Lancets (ONETOUCH DELICA PLUS [id number]) MISC CHECK BLOOD SUGAR TWO TIMES DAILY 200 each 11  . Multiple Vitamin (MULTIVITAMIN WITH MINERALS) TABS tablet Take 1 tablet by mouth daily.    . Omega-3 Fatty Acids (FISH OIL) 300 MG CAPS Take 1 capsule (300 mg total) by mouth daily. 90 capsule 3  . ONETOUCH VERIO test strip TEST TWO TIMES DAILY 200 strip 0  . pioglitazone (ACTOS) 30 MG tablet Take 1 tablet (30 mg total) by mouth daily. Due for annual visit with labs 90 tablet 0  . potassium chloride SA (KLOR-CON) 20 MEQ tablet Take 1 tablet (20 mEq total) by mouth 2 (two) times daily. Due for annual visit with labs 180 tablet 0  . triamcinolone ointment (KENALOG) 0.5 % APPLY  OINTMENT TOPICALLY TWICE DAILY 90 g 0   No current facility-administered medications on file prior to visit.     Allergies  Allergen Reactions  . Clindamycin/Lincomycin Swelling    Possible facial and lip swelling  . Ace Inhibitors     Facial and lip swelling possibly associated    Family History  Problem Relation Age of Onset  . Hypertension Mother   . COPD Mother   . Hypertension Brother   . Cancer Brother        leukemia  . Hypertension Brother   . Diabetes Neg Hx     BP 134/88 (BP Location: Left Arm, Patient Position: Sitting, Cuff Size: Normal)   Pulse 90   Ht 6' (1.829 m)   Wt 177 lb 9.6 oz (80.6 kg)   SpO2 96%   BMI 24.09 kg/m    Review of Systems denies weight loss, blurry vision, headache, chest pain, sob, n/v, urinary frequency, muscle cramps, excessive diaphoresis, memory loss, depression, cold intolerance, rhinorrhea, and easy bruising.       Objective:   Physical Exam VS: see vs page  GEN: no distress HEAD: head: no deformity eyes: no periorbital swelling, no proptosis external nose and ears are normal NECK:  supple, thyroid is not enlarged CHEST WALL: no deformity LUNGS: clear to auscultation CV: reg rate and rhythm, no murmur ABD: abdomen is soft, nontender.  no hepatosplenomegaly.  not distended.  no hernia MUSCULOSKELETAL: muscle bulk and strength are grossly normal.  no obvious joint swelling.  gait is normal and steady EXTEMITIES: no deformity.  no ulcer on the feet.  feet are of normal color and temp.  no edema PULSES: dorsalis pedis intact bilat.  no carotid bruit NEURO:  cn 2-12 grossly intact.   readily moves all 4's.  sensation is intact to touch on the feet SKIN:  Normal texture and temperature.  No rash or suspicious lesion is visible.   NODES:  None palpable at the neck PSYCH: alert, well-oriented.  Does not appear anxious nor depressed.   Lab Results  Component Value Date   HGBA1C 10.6 (H) 04/20/2019   Lab Results  Component Value Date   CREATININE 1.37 05/17/2019   BUN 16 05/17/2019   NA 139 05/17/2019   K 3.6 05/17/2019   CL 107 05/17/2019   CO2 23 05/17/2019   I have reviewed outside records, and summarized: Pt was noted to have elevated a1c, and referred here.  HTN and renal insuff were also addressed  I personally reviewed electrocardiogram tracing (04/27/09): Indication: HTN Impression: NSR with 1D-AVB.  poss old IMI.  No hypertrophy. Compared to 2018: changes in II and AVF are less      Assessment & Plan:  Type 2 DM, new to me.  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  please let me know if you wish to be referred to a dietician.  high blood sugar is very risky to your health.  you should see an  eye doctor and dentist every year.  It is very important to get all recommended vaccinations.  Controlling your blood pressure and cholesterol drastically reduces the damage diabetes does to your body.  Those who smoke should quit.  Please discuss these with your doctor.  check your blood sugar once a day.  vary the time of day when you check, between before the 3 meals, and at bedtime.  also check if you have symptoms of your blood sugar being too high or too low.  please keep a record of the readings and bring it to your next appointment here (or you can bring the meter itself).  You can write it on any piece of paper.  please call us sooner if your blood sugar goes below 70, or if you have a lot of readings over 200.  Please continue the same pioglitazone, and: You can stay off the insulin for now, and: I have sent a prescription to your pharmacy, to start "Rybelsus," and:  Reduce the glipizide to 1/2 pill per day.   Our goals are to keep the A1c less than 7, avoid low sugar, and to not need insulin.   Please come back for a follow-up appointment in 3-4 weeks.

## 2019-06-16 ENCOUNTER — Other Ambulatory Visit: Payer: Self-pay

## 2019-06-20 ENCOUNTER — Ambulatory Visit (INDEPENDENT_AMBULATORY_CARE_PROVIDER_SITE_OTHER): Payer: Medicare Other | Admitting: Endocrinology

## 2019-06-20 ENCOUNTER — Other Ambulatory Visit: Payer: Self-pay

## 2019-06-20 ENCOUNTER — Encounter: Payer: Self-pay | Admitting: Endocrinology

## 2019-06-20 VITALS — BP 120/78 | HR 88 | Ht 72.0 in | Wt 178.8 lb

## 2019-06-20 DIAGNOSIS — E1129 Type 2 diabetes mellitus with other diabetic kidney complication: Secondary | ICD-10-CM | POA: Diagnosis not present

## 2019-06-20 DIAGNOSIS — E119 Type 2 diabetes mellitus without complications: Secondary | ICD-10-CM

## 2019-06-20 LAB — POCT GLYCOSYLATED HEMOGLOBIN (HGB A1C): Hemoglobin A1C: 9.1 % — AB (ref 4.0–5.6)

## 2019-06-20 MED ORDER — RYBELSUS 7 MG PO TABS: 7.0000 mg | ORAL_TABLET | Freq: Every day | ORAL | 3 refills | Status: DC

## 2019-06-20 NOTE — Patient Instructions (Signed)
I have sent a prescription to your pharmacy, to increase the Rybelsus.  Please continue the same other diabetes medications.  check your blood sugar once a day.  vary the time of day when you check, between before the 3 meals, and at bedtime.  also check if you have symptoms of your blood sugar being too high or too low.  please keep a record of the readings and bring it to your next appointment here (or you can bring the meter itself).  You can write it on any piece of paper.  please call us sooner if your blood sugar goes below 70, or if you have a lot of readings over 200.   Please come back for a follow-up appointment in 2 months.     

## 2019-06-20 NOTE — Progress Notes (Deleted)
Medical treatment/procedure(s) were performed by non-physician practitioner and as supervising physician I was immediately available for consultation/collaboration. I agree with above. Canesha Tesfaye A Rieley Hausman, MD  

## 2019-06-20 NOTE — Progress Notes (Signed)
Subjective:    Patient ID: Michael Snyder, male    DOB: Apr 18, 1951, 68 y.o.   MRN: [id number]  HPI Pt returns for f/u of diabetes mellitus: DM type: 2 Dx'ed: 1324 Complications: renal insuff Therapy: 3 oral meds DKA: never Severe hypoglycemia: never Pancreatitis: never Pancreatic imaging: CT (2020): Fatty atrophy. Coarse calcification of the pancreatic duct and dorsal duct. There is dilatation of the main pancreatic duct.  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  pt states he feels well in general.  He takes meds as rx'ed. Past Medical History:  Diagnosis Date  . Alcohol abuse   . Arthritis   . Diabetes mellitus, type 2 ([place])   . Head injury, closed, without LOC 1980s  . History of alcohol abuse   . Hypertension   . 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 Pacemaker    [place]  . Seizure disorder ([place])   . Seizures ([place])    last time in 1990's    Past Surgical History:  Procedure Laterality Date  . LACERATION REPAIR  1980   scalp  . PACEMAKER LEAD REMOVAL N/A 03/23/2014   Procedure: PACEMAKER LEAD REMOVAL;  Surgeon: Evans Lance, MD;  Location: [place] [facility];  Service: Cardiovascular;  Laterality: N/A;  . PTDVP     7/08 medtronic DDD    Social History   Socioeconomic History  . Marital status: Legally Separated    Spouse name: Not on file  . Number of children: 3  . Years of education: Not on file  . Highest education level: Not on file  Occupational History  . Occupation: Games developer, truck Geophysicist/field seismologist  Tobacco Use  . Smoking status: Never Smoker  . Smokeless tobacco: Never Used  Substance and Sexual Activity  . Alcohol use: No    Comment: quit 20 yrs ago  . Drug use: No  . Sexual activity: Yes    Partners: Female  Other Topics Concern  . Not on file  Social History Narrative   12th grade education. H/O substance abuse- now abstemious and  works with other in recovery..Attends NA on a regular basis and serves as a sponsor. Married 18 years-divorced. Married 5 years 2nd marriage.3 daughters previous marriage, 9 grandchildren, 3 great-grands. 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 Difficulty of Paying Living Expenses: Not on file  Food Insecurity:   . Worried About Charity fundraiser in the Last Year: Not on file  . Ran Out of Food in the Last Year: Not on file  Transportation Needs:   . Lack of Transportation (Medical): Not on file  . Lack of Transportation (Non-Medical): Not on file  Physical Activity: Sufficiently Active  . Days of Exercise per Week: 5 days  . Minutes of Exercise per Session: 80 min  Stress:   . Feeling of Stress : Not on file  Social Connections: Unknown  . Frequency of Communication with Friends and Family: Not on file  . Frequency of Social Gatherings with Friends and Family: Not on file  . Attends Religious Services: Not on file  . Active Member of Clubs or Organizations: Not on file  . Attends Archivist Meetings: Not on file  . Marital Status: Separated  Intimate  Partner Violence: Unknown  . Fear of Current or Ex-Partner: Not asked  . Emotionally Abused: Not asked  . Physically Abused: Not asked  . Sexually Abused: Not asked    Current Outpatient Medications on File Prior to Visit  Medication Sig Dispense Refill  . ALPHAGAN P 0.1 % SOLN Place 2 drops into the left eye daily.    [name] amLODipine (NORVASC) 5 MG tablet Take 1 tablet (5 mg total) by mouth daily. 90 tablet 1  . aspirin EC 81 MG tablet Take 81 mg daily by mouth.    [name] atorvastatin (LIPITOR) 20 MG tablet Take 1 tablet (20 mg total) by mouth daily. 90 tablet 3  . Blood Pressure Monitoring (BLOOD PRESSURE KIT) DEVI 1 kit by Does not apply route daily. 1 each 0  . Calcium Carbonate-Vitamin D (CALTRATE 600+D) 600-400 MG-UNIT per tablet Take 1 tablet by mouth 2  (two) times daily. 180 tablet 3  . diclofenac (VOLTAREN) 75 MG EC tablet Take 1 tablet (75 mg total) by mouth 2 (two) times daily. 60 tablet 0  . gabapentin (NEURONTIN) 300 MG capsule Take 300 mg by mouth every 6 (six) hours as needed (pain).     [name] glipiZIDE (GLUCOTROL) 5 MG tablet Take 0.5 tablets (2.5 mg total) by mouth daily before breakfast. Due for annual visit with labs 30 tablet 0  . hydrochlorothiazide (HYDRODIURIL) 25 MG tablet Take 1 tablet (25 mg total) by mouth daily. 90 tablet 3  . ibuprofen (ADVIL) 600 MG tablet Take 1 tablet (600 mg total) by mouth every 6 (six) hours as needed. 30 tablet 0  . Lancets (ONETOUCH DELICA PLUS [id number]) MISC CHECK BLOOD SUGAR TWO TIMES DAILY 200 each 11  . Multiple Vitamin (MULTIVITAMIN WITH MINERALS) TABS tablet Take 1 tablet by mouth daily.    . Omega-3 Fatty Acids (FISH OIL) 300 MG CAPS Take 1 capsule (300 mg total) by mouth daily. 90 capsule 3  . ONETOUCH VERIO test strip TEST TWO TIMES DAILY 200 strip 0  . pioglitazone (ACTOS) 30 MG tablet Take 1 tablet (30 mg total) by mouth daily. Due for annual visit with labs 90 tablet 0  . potassium chloride SA (KLOR-CON) 20 MEQ tablet Take 1 tablet (20 mEq total) by mouth 2 (two) times daily. Due for annual visit with labs 180 tablet 0  . triamcinolone ointment (KENALOG) 0.5 % APPLY  OINTMENT TOPICALLY TWICE DAILY 90 g 0   No current facility-administered medications on file prior to visit.    Allergies  Allergen Reactions  . Clindamycin/Lincomycin Swelling    Possible facial and lip swelling  . Ace Inhibitors     Facial and lip swelling possibly associated    Family History  Problem Relation Age of Onset  . Hypertension Mother   . COPD Mother   . Hypertension Brother   . Cancer Brother        leukemia  . Hypertension Brother   . Diabetes Neg Hx     BP 120/78 (BP Location: Right Arm, Patient Position: Sitting, Cuff Size: Large)   Pulse 88   Ht 6' (1.829 m)   Wt 178 lb 12.8 oz (81.1 kg)    SpO2 98%   BMI 24.25 kg/m   Review of Systems He denies hypoglycemia and nausea.      Objective:   Physical Exam VITAL SIGNS:  See vs page GENERAL: no distress Pulses: dorsalis pedis intact bilat.   MSK: no deformity of the feet CV: no leg edema Skin:  no ulcer on the feet.  normal color and temp on the feet. Neuro: sensation is intact to touch on the feet Ext: there is bilateral onychomycosis of the toenails.    Lab Results  Component Value Date   HGBA1C 9.1 (A) 06/20/2019       Assessment & Plan:  Type 2 DM: he needs increased rx Renal insuff: This limits rx options Occupational state: he wants to control DM without insulin.    Patient Instructions  I have sent a prescription to your pharmacy, to increase the Rybelsus Please continue the same other diabetes medications check your blood sugar once a day.  vary the time of day when you check, between before the 3 meals, and at bedtime.  also check if you have symptoms of your blood sugar being too high or too low.  please keep a record of the readings and bring it to your next appointment here (or you can bring the meter itself).  You can write it on any piece of paper.  please call us sooner if your blood sugar goes below 70, or if you have a lot of readings over 200. Please come back for a follow-up appointment in 2 months.

## 2019-06-23 ENCOUNTER — Other Ambulatory Visit: Payer: Self-pay | Admitting: Internal Medicine

## 2019-06-25 ENCOUNTER — Other Ambulatory Visit: Payer: Self-pay | Admitting: Internal Medicine

## 2019-07-04 ENCOUNTER — Other Ambulatory Visit: Payer: Self-pay | Admitting: Internal Medicine

## 2019-07-08 ENCOUNTER — Other Ambulatory Visit: Payer: Self-pay | Admitting: Internal Medicine

## 2019-07-17 ENCOUNTER — Other Ambulatory Visit: Payer: Self-pay | Admitting: Internal Medicine

## 2019-07-17 DIAGNOSIS — I10 Essential (primary) hypertension: Secondary | ICD-10-CM

## 2019-07-28 ENCOUNTER — Telehealth: Payer: Self-pay

## 2019-07-28 NOTE — Telephone Encounter (Signed)
Spoke with patient to remind of missed remote transmission 

## 2019-08-04 ENCOUNTER — Ambulatory Visit (INDEPENDENT_AMBULATORY_CARE_PROVIDER_SITE_OTHER): Payer: Medicare Other | Admitting: *Deleted

## 2019-08-04 DIAGNOSIS — I495 Sick sinus syndrome: Secondary | ICD-10-CM

## 2019-08-07 LAB — CUP PACEART REMOTE DEVICE CHECK
Battery Impedance: 256 Ohm
Battery Remaining Longevity: 127 mo
Battery Voltage: 2.79 V
Brady Statistic AP VP Percent: 0 %
Brady Statistic AP VS Percent: 5 %
Brady Statistic AS VP Percent: 1 %
Brady Statistic AS VS Percent: 94 %
Date Time Interrogation Session: 20210130155435
Implantable Lead Implant Date: 20080711
Implantable Lead Implant Date: 20150918
Implantable Lead Location: 753859
Implantable Lead Location: 753860
Implantable Lead Model: 5076
Implantable Lead Model: 5076
Implantable Pulse Generator Implant Date: 20150918
Lead Channel Impedance Value: 485 Ohm
Lead Channel Impedance Value: 515 Ohm
Lead Channel Pacing Threshold Amplitude: 0.625 V
Lead Channel Pacing Threshold Amplitude: 0.75 V
Lead Channel Pacing Threshold Pulse Width: 0.4 ms
Lead Channel Pacing Threshold Pulse Width: 0.4 ms
Lead Channel Setting Pacing Amplitude: 2 V
Lead Channel Setting Pacing Amplitude: 2.5 V
Lead Channel Setting Pacing Pulse Width: 0.4 ms
Lead Channel Setting Sensing Sensitivity: 2 mV

## 2019-08-22 ENCOUNTER — Other Ambulatory Visit: Payer: Self-pay

## 2019-08-24 ENCOUNTER — Ambulatory Visit (INDEPENDENT_AMBULATORY_CARE_PROVIDER_SITE_OTHER): Payer: Medicare Other | Admitting: Endocrinology

## 2019-08-24 ENCOUNTER — Encounter: Payer: Self-pay | Admitting: Endocrinology

## 2019-08-24 ENCOUNTER — Other Ambulatory Visit: Payer: Self-pay

## 2019-08-24 DIAGNOSIS — E119 Type 2 diabetes mellitus without complications: Secondary | ICD-10-CM

## 2019-08-24 MED ORDER — GLIPIZIDE 5 MG PO TABS: 5.0000 mg | ORAL_TABLET | Freq: Every day | ORAL | 3 refills | Status: DC

## 2019-08-24 MED ORDER — RYBELSUS 14 MG PO TABS: 14.0000 mg | ORAL_TABLET | Freq: Every day | ORAL | 3 refills | Status: DC

## 2019-08-24 NOTE — Patient Instructions (Addendum)
I have sent 2 prescriptions to your pharmacy: to double the rybelsus, and reduce the glipizide to the morning only Please come back for a follow-up appointment in 1 month.

## 2019-08-24 NOTE — Progress Notes (Signed)
Subjective:    Patient ID: Michael Snyder, male    DOB: Nov 22, 1950, 69 y.o.   MRN: [id number]  HPI  telehealth visit today via phone x 8 minutes.   Alternatives to telehealth are presented to this patient, and the patient agrees to the telehealth visit. Pt is advised of the cost of the visit, and agrees to this, also.   Patient is at home, and I am at the office.   Persons attending the telehealth visit: the patient and I Pt returns for f/u of diabetes mellitus: DM type: 2 Dx'ed: 5003 Complications: renal insuff Therapy: 3 oral meds DKA: never Severe hypoglycemia: never Pancreatitis: never Pancreatic imaging: CT (2020): Fatty atrophy. Coarse calcification of the pancreatic duct and dorsal duct. There is dilatation of the main pancreatic duct.  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  pt states he feels well in general.  He takes meds as rx'ed.   Past Medical History:  Diagnosis Date  . Alcohol abuse   . Arthritis   . Diabetes mellitus, type 2 ([place])   . Head injury, closed, without LOC 1980s  . History of alcohol abuse   . Hypertension   . 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 Pacemaker    [place]  . Seizure disorder ([place])   . Seizures ([place])    last time in 1990's    Past Surgical History:  Procedure Laterality Date  . LACERATION REPAIR  1980   scalp  . PACEMAKER LEAD REMOVAL N/A 03/23/2014   Procedure: PACEMAKER LEAD REMOVAL;  Surgeon: Evans Lance, MD;  Location: [place];  Service: Cardiovascular;  Laterality: N/A;  . PTDVP     7/08 medtronic DDD    Social History   Socioeconomic History  . Marital status: Legally Separated    Spouse name: Not on file  . Number of children: 3  . Years of education: Not on file  . Highest education level: Not on file  Occupational History  . Occupation: Games developer, truck Geophysicist/field seismologist  Tobacco Use  .  Smoking status: Never Smoker  . Smokeless tobacco: Never Used  Substance and Sexual Activity  . Alcohol use: No    Comment: quit 20 yrs ago  . Drug use: No  . Sexual activity: Yes    Partners: Female  Other Topics Concern  . Not on file  Social History Narrative   12th grade education. H/O substance abuse- now abstemious and works with other in recovery..Attends NA on a regular basis and serves as a sponsor. Married 18 years-divorced. Married 5 years 2nd marriage.3 daughters previous marriage, 9 grandchildren, 3 great-grands. 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 Difficulty of Paying Living Expenses: Not on file  Food Insecurity:   . Worried About Charity fundraiser in the Last Year: Not on file  . Ran Out of Food in the Last Year: Not on file  Transportation Needs:   . Lack of Transportation (Medical): Not on file  . Lack of Transportation (Non-Medical): Not on file  Physical Activity: Sufficiently Active  . Days of Exercise per Week: 5 days  . Minutes of Exercise per Session: 80 min  Stress:   . Feeling of Stress : Not on  file  Social Connections: Unknown  . Frequency of Communication with Friends and Family: Not on file  . Frequency of Social Gatherings with Friends and Family: Not on file  . Attends Religious Services: Not on file  . Active Member of Clubs or Organizations: Not on file  . Attends Archivist Meetings: Not on file  . Marital Status: Separated  Intimate Partner Violence: Unknown  . Fear of Current or Ex-Partner: Not asked  . Emotionally Abused: Not asked  . Physically Abused: Not asked  . Sexually Abused: Not asked    Current Outpatient Medications on File Prior to Visit  Medication Sig Dispense Refill  . ALPHAGAN P 0.1 % SOLN Place 2 drops into the left eye daily.    [name] amLODipine (NORVASC) 5 MG tablet Take 1 tablet (5 mg total) by mouth daily. 90 tablet 1  . aspirin EC 81  MG tablet Take 81 mg daily by mouth.    [name] atorvastatin (LIPITOR) 20 MG tablet Take 1 tablet (20 mg total) by mouth daily. 90 tablet 3  . Blood Pressure Monitoring (BLOOD PRESSURE KIT) DEVI 1 kit by Does not apply route daily. 1 each 0  . Calcium Carbonate-Vitamin D (CALTRATE 600+D) 600-400 MG-UNIT per tablet Take 1 tablet by mouth 2 (two) times daily. 180 tablet 3  . diclofenac (VOLTAREN) 75 MG EC tablet Take 1 tablet by mouth twice daily 60 tablet 2  . gabapentin (NEURONTIN) 300 MG capsule Take 300 mg by mouth every 6 (six) hours as needed (pain).     . hydrochlorothiazide (HYDRODIURIL) 25 MG tablet Take 1 tablet (25 mg total) by mouth daily. 90 tablet 3  . hydrochlorothiazide (MICROZIDE) 12.5 MG capsule Take 1 capsule by mouth once daily 90 capsule 1  . ibuprofen (ADVIL) 600 MG tablet Take 1 tablet (600 mg total) by mouth every 6 (six) hours as needed. 30 tablet 0  . Lancets (ONETOUCH DELICA PLUS [id number]) MISC CHECK BLOOD SUGAR TWO TIMES DAILY 200 each 3  . Multiple Vitamin (MULTIVITAMIN WITH MINERALS) TABS tablet Take 1 tablet by mouth daily.    . Omega-3 Fatty Acids (FISH OIL) 300 MG CAPS Take 1 capsule (300 mg total) by mouth daily. 90 capsule 3  . ONETOUCH VERIO test strip TEST TWICE DAILY 200 strip 3  . pioglitazone (ACTOS) 30 MG tablet TAKE 1 TABLET BY MOUTH  DAILY DUE FOR ANNUAL VISIT  WITH LABS 90 tablet 3  . potassium chloride SA (KLOR-CON) 20 MEQ tablet TAKE 1 TABLET BY MOUTH  TWICE DAILY 180 tablet 3  . triamcinolone ointment (KENALOG) 0.5 % APPLY  OINTMENT TOPICALLY TWICE DAILY 90 g 0   No current facility-administered medications on file prior to visit.    Allergies  Allergen Reactions  . Clindamycin/Lincomycin Swelling    Possible facial and lip swelling  . Ace Inhibitors     Facial and lip swelling possibly associated    Family History  Problem Relation Age of Onset  . Hypertension Mother   . COPD Mother   . Hypertension Brother   . Cancer Brother        leukemia    . Hypertension Brother   . Diabetes Neg Hx     There were no vitals taken for this visit.   Review of Systems He denies hypoglycemia    Objective:   Physical Exam      Assessment & Plan:  Type 2 DM: apparently well-controlled Renal insuff: we should taper off glipizide.   Patient  Instructions  I have sent 2 prescriptions to your pharmacy: to double the rybelsus, and reduce the glipizide to the morning only Please come back for a follow-up appointment in 1 month.

## 2019-09-20 ENCOUNTER — Other Ambulatory Visit: Payer: Self-pay

## 2019-09-22 ENCOUNTER — Encounter: Payer: Self-pay | Admitting: Endocrinology

## 2019-09-22 ENCOUNTER — Ambulatory Visit: Payer: Medicare Other | Admitting: Endocrinology

## 2019-09-22 ENCOUNTER — Other Ambulatory Visit: Payer: Self-pay

## 2019-09-22 VITALS — BP 134/80 | HR 81 | Ht 72.0 in | Wt 178.0 lb

## 2019-09-22 DIAGNOSIS — E119 Type 2 diabetes mellitus without complications: Secondary | ICD-10-CM

## 2019-09-22 LAB — POCT GLYCOSYLATED HEMOGLOBIN (HGB A1C): Hemoglobin A1C: 9 % — AB (ref 4.0–5.6)

## 2019-09-22 LAB — BASIC METABOLIC PANEL
BUN: 15 mg/dL (ref 6–23)
CO2: 28 mEq/L (ref 19–32)
Calcium: 9.1 mg/dL (ref 8.4–10.5)
Chloride: 106 mEq/L (ref 96–112)
Creatinine, Ser: 1.66 mg/dL — ABNORMAL HIGH (ref 0.40–1.50)
GFR: 49.8 mL/min — ABNORMAL LOW (ref >60.00)
Glucose, Bld: 174 mg/dL — ABNORMAL HIGH (ref 70–99)
Potassium: 4.4 mEq/L (ref 3.5–5.1)
Sodium: 137 mEq/L (ref 135–145)

## 2019-09-22 NOTE — Progress Notes (Signed)
Subjective:    Patient ID: Michael Snyder, male    DOB: 1950/09/02, 69 y.o.   MRN: [id number]  HPI Pt returns for f/u of diabetes mellitus: DM type: 2 Dx'ed: 8088 Complications: renal insuff Therapy: 3 oral meds DKA: never Severe hypoglycemia: never Pancreatitis: never Pancreatic imaging: CT (2020): Fatty atrophy. Coarse calcification of the pancreatic duct and dorsal duct, and dilatation of the main pancreatic duct.   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  Interval history: no cbg record, but states cbg's vary from 84-100's.  pt states he feels well in general.  He takes meds as rx'ed.   Past Medical History:  Diagnosis Date  . Alcohol abuse   . Arthritis   . Diabetes mellitus, type 2 ([place])   . Head injury, closed, without LOC 1980s  . History of alcohol abuse   . Hypertension   . 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 Pacemaker    [place]  . Seizure disorder ([place])   . Seizures ([place])    last time in 1990's    Past Surgical History:  Procedure Laterality Date  . LACERATION REPAIR  1980   scalp  . PACEMAKER LEAD REMOVAL N/A 03/23/2014   Procedure: PACEMAKER LEAD REMOVAL;  Surgeon: Evans Lance, MD;  Location: [place];  Service: Cardiovascular;  Laterality: N/A;  . PTDVP     7/08 medtronic DDD    Social History   Socioeconomic History  . Marital status: Legally Separated    Spouse name: Not on file  . Number of children: 3  . Years of education: Not on file  . Highest education level: Not on file  Occupational History  . Occupation: Games developer, truck Geophysicist/field seismologist  Tobacco Use  . Smoking status: Never Smoker  . Smokeless tobacco: Never Used  Substance and Sexual Activity  . Alcohol use: No    Comment: quit 20 yrs ago  . Drug use: No  . Sexual activity: Yes    Partners: Female  Other Topics Concern  . Not on file  Social History Narrative   12th grade education. H/O substance abuse- now abstemious and  works with other in recovery..Attends NA on a regular basis and serves as a sponsor. Married 18 years-divorced. Married 5 years 2nd marriage.3 daughters previous marriage, 9 grandchildren, 3 great-grands. 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 Difficulty of Paying Living Expenses:   Food Insecurity:   . Worried About Charity fundraiser in the Last Year:   . Arboriculturist in the Last Year:   Transportation Needs:   . Film/video editor (Medical):   [name] Lack of Transportation (Non-Medical):   Physical Activity: Sufficiently Active  . Days of Exercise per Week: 5 days  . Minutes of Exercise per Session: 80 min  Stress:   . Feeling of Stress :   Social Connections: Unknown  . Frequency of Communication with Friends and Family: Not on file  . Frequency of Social Gatherings with Friends and Family: Not on file  . Attends Religious Services: Not on file  . Active Member of Clubs or Organizations: Not on file  . Attends Archivist Meetings: Not on file  . Marital Status: Separated  Intimate Partner Violence: Unknown  . Fear of Current or Ex-Partner:  Not asked  . Emotionally Abused: Not asked  . Physically Abused: Not asked  . Sexually Abused: Not asked    Current Outpatient Medications on File Prior to Visit  Medication Sig Dispense Refill  . ALPHAGAN P 0.1 % SOLN Place 2 drops into the left eye daily.    [name] amLODipine (NORVASC) 5 MG tablet Take 1 tablet (5 mg total) by mouth daily. 90 tablet 1  . aspirin EC 81 MG tablet Take 81 mg daily by mouth.    [name] atorvastatin (LIPITOR) 20 MG tablet Take 1 tablet (20 mg total) by mouth daily. 90 tablet 3  . Blood Pressure Monitoring (BLOOD PRESSURE KIT) DEVI 1 kit by Does not apply route daily. 1 each 0  . Calcium Carbonate-Vitamin D (CALTRATE 600+D) 600-400 MG-UNIT per tablet Take 1 tablet by mouth 2 (two) times daily. 180 tablet 3  . diclofenac (VOLTAREN) 75 MG EC  tablet Take 1 tablet by mouth twice daily 60 tablet 2  . gabapentin (NEURONTIN) 300 MG capsule Take 300 mg by mouth every 6 (six) hours as needed (pain).     [name] glipiZIDE (GLUCOTROL) 5 MG tablet Take 1 tablet (5 mg total) by mouth daily before breakfast. 90 tablet 3  . hydrochlorothiazide (HYDRODIURIL) 25 MG tablet Take 1 tablet (25 mg total) by mouth daily. 90 tablet 3  . hydrochlorothiazide (MICROZIDE) 12.5 MG capsule Take 1 capsule by mouth once daily 90 capsule 1  . ibuprofen (ADVIL) 600 MG tablet Take 1 tablet (600 mg total) by mouth every 6 (six) hours as needed. 30 tablet 0  . Lancets (ONETOUCH DELICA PLUS [id number]) MISC CHECK BLOOD SUGAR TWO TIMES DAILY 200 each 3  . Multiple Vitamin (MULTIVITAMIN WITH MINERALS) TABS tablet Take 1 tablet by mouth daily.    . Omega-3 Fatty Acids (FISH OIL) 300 MG CAPS Take 1 capsule (300 mg total) by mouth daily. 90 capsule 3  . ONETOUCH VERIO test strip TEST TWICE DAILY 200 strip 3  . pioglitazone (ACTOS) 30 MG tablet TAKE 1 TABLET BY MOUTH  DAILY DUE FOR ANNUAL VISIT  WITH LABS 90 tablet 3  . potassium chloride SA (KLOR-CON) 20 MEQ tablet TAKE 1 TABLET BY MOUTH  TWICE DAILY 180 tablet 3  . Semaglutide (RYBELSUS) 14 MG TABS Take 14 mg by mouth daily. 90 tablet 3  . triamcinolone ointment (KENALOG) 0.5 % APPLY  OINTMENT TOPICALLY TWICE DAILY 90 g 0   No current facility-administered medications on file prior to visit.    Allergies  Allergen Reactions  . Clindamycin/Lincomycin Swelling    Possible facial and lip swelling  . Ace Inhibitors     Facial and lip swelling possibly associated    Family History  Problem Relation Age of Onset  . Hypertension Mother   . COPD Mother   . Hypertension Brother   . Cancer Brother        leukemia  . Hypertension Brother   . Diabetes Neg Hx     BP 134/80   Pulse 81   Ht 6' (1.829 m)   Wt 178 lb (80.7 kg)   SpO2 94%   BMI 24.14 kg/m    Review of Systems He denies hypoglycemia    Objective:    Physical Exam VITAL SIGNS:  See vs page GENERAL: no distress Pulses: dorsalis pedis intact bilat.   MSK: no deformity of the feet CV: 1+ bilat leg edema Skin:  no ulcer on the feet.  normal color and temp on the feet. Neuro:  sensation is intact to touch on the feet Ext: there is bilateral onychomycosis of the toenails  Lab Results  Component Value Date   CREATININE 1.37 05/17/2019   BUN 16 05/17/2019   NA 139 05/17/2019   K 3.6 05/17/2019   CL 107 05/17/2019   CO2 23 05/17/2019    Lab Results  Component Value Date   HGBA1C 9.0 (A) 09/22/2019      Assessment & Plan:  Type 2 DM, with renal insuff: uncertain glycemic control Discordance between A1c and reported cbg's.  Check fructosamine.  Edema: This limits rx options.   Patient Instructions  A different type of diabetes lood test is requested for you today.  We'll let you know about the results.  check your blood sugar once a day.  vary the time of day when you check, between before the 3 meals, and at bedtime.  also check if you have symptoms of your blood sugar being too high or too low.  please keep a record of the readings and bring it to your next appointment here (or you can bring the meter itself).  You can write it on any piece of paper.  please call us sooner if your blood sugar goes below 70, or if you have a lot of readings over 200.   Please come back for a follow-up appointment in 2 months.

## 2019-09-22 NOTE — Patient Instructions (Addendum)
A different type of diabetes lood test is requested for you today.  We'll let you know about the results.  check your blood sugar once a day.  vary the time of day when you check, between before the 3 meals, and at bedtime.  also check if you have symptoms of your blood sugar being too high or too low.  please keep a record of the readings and bring it to your next appointment here (or you can bring the meter itself).  You can write it on any piece of paper.  please call us sooner if your blood sugar goes below 70, or if you have a lot of readings over 200.   Please come back for a follow-up appointment in 2 months.

## 2019-09-27 ENCOUNTER — Telehealth: Payer: Self-pay

## 2019-09-27 DIAGNOSIS — E119 Type 2 diabetes mellitus without complications: Secondary | ICD-10-CM

## 2019-09-27 LAB — FRUCTOSAMINE: Fructosamine: 466 umol/L — ABNORMAL HIGH (ref 205–285)

## 2019-09-27 NOTE — Telephone Encounter (Signed)
I am not able to see Michael Snyder schedule. Are you able to answer Dr. George Hugh question?

## 2019-09-27 NOTE — Telephone Encounter (Signed)
Called pt to inform him about lab results. Has agreed to see Bonita Quin or Vernona Rieger. Routing back to Dr. Everardo All for him to place referral.

## 2019-09-27 NOTE — Telephone Encounter (Signed)
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  Also, it is very dangerous to be this high.  You should take insulin.  Ok to see Michael Snyder or Michael Snyder for this?

## 2019-09-27 NOTE — Telephone Encounter (Signed)
Would this be okay?

## 2019-09-27 NOTE — Telephone Encounter (Signed)
She is free tomorrow from 2:30  to 3:30.  Maybe see him before you?  Should only take about 30 min. ???

## 2019-09-27 NOTE — Addendum Note (Signed)
Addended by: Romero Belling on: 09/27/2019 12:40 PM   Modules accepted: Orders

## 2019-09-27 NOTE — Telephone Encounter (Signed)
I have 3:15 open tomorrow.  Can he see Vernona Rieger at about the same time?  I placed referral.

## 2019-09-27 NOTE — Telephone Encounter (Signed)
Please help with this scheduling request for Michael Snyder

## 2019-09-27 NOTE — Telephone Encounter (Signed)
OK.  Please put him in my vacant appt.

## 2019-09-28 ENCOUNTER — Other Ambulatory Visit: Payer: Self-pay

## 2019-09-28 ENCOUNTER — Ambulatory Visit (INDEPENDENT_AMBULATORY_CARE_PROVIDER_SITE_OTHER): Payer: Medicare Other | Admitting: Endocrinology

## 2019-09-28 ENCOUNTER — Encounter: Payer: Self-pay | Admitting: Dietician

## 2019-09-28 ENCOUNTER — Encounter: Payer: Self-pay | Admitting: Endocrinology

## 2019-09-28 ENCOUNTER — Encounter: Payer: Medicare Other | Attending: Endocrinology | Admitting: Dietician

## 2019-09-28 VITALS — BP 120/74 | HR 80 | Ht 72.0 in | Wt 177.0 lb

## 2019-09-28 DIAGNOSIS — E119 Type 2 diabetes mellitus without complications: Secondary | ICD-10-CM | POA: Insufficient documentation

## 2019-09-28 DIAGNOSIS — Z794 Long term (current) use of insulin: Secondary | ICD-10-CM

## 2019-09-28 MED ORDER — LANTUS SOLOSTAR 100 UNIT/ML ~~LOC~~ SOPN: 10.0000 [IU] | PEN_INJECTOR | Freq: Every day | SUBCUTANEOUS | 99 refills | Status: DC

## 2019-09-28 NOTE — Patient Instructions (Signed)
I have sent a prescription to your pharmacy, to start insulin. Please continue the same other medications for now. check your blood sugar twice a day.  vary the time of day when you check, between before the 3 meals, and at bedtime.  also check if you have symptoms of your blood sugar being too high or too low.  please keep a record of the readings and bring it to your next appointment here (or you can bring the meter itself).  You can write it on any piece of paper.  please call us sooner if your blood sugar goes below 70, or if you have a lot of readings over 200. Please come back for a follow-up appointment in 2-3 weeks.  Please bring your meter and a strip, so we can compare to ours.

## 2019-09-28 NOTE — Progress Notes (Signed)
Brief Nutrition Note: Visit time in the office [phone].  Patient is here today alone to learn about insulin injection.  He reports that he does not drink beverages with carbohydrates. He would like to learn more about nutrition.  Appointment made for this in 1 month. He lives with his brother who has diabetes. He is a Naval architect.  History includes Type 2 Diabetes.  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  He was on insulin last year after a hospital stay and states he was not educated on this at that time.  His cousin taught him.  He states that he is happy for the education today. He was able to come off the insulin soon after but needs to restart per MD due to most recent A1c of 9% and Fructosamine 466 09/22/19. Medications include Glipizide, Actose, Rybelsus.  Instructed patient on the kinds of insulin and action time. Discussed that if he is prescribed a mixed insulin that this needs to be rolled or moved back and forth to mix. Instructed him how to prime the insulin pen on first use and storage of insulin.  Instructed him on site preparation, rotation, and injection of insulin using a pen.  Patient verbalized understanding.  He states that he knows how to use a vial and syringe if needed as well. Discussed needle disposal.  Instructed patient on signs and symptoms of hypoglycemia as well as treatment.  Patient is to call for any questions and has no questions at this time.  Oran Rein, RD, LDN, CDE

## 2019-09-28 NOTE — Patient Instructions (Signed)
Be sure to rotate your insulin injection sites.  Please call for any questions.

## 2019-09-28 NOTE — Progress Notes (Signed)
Subjective:    Patient ID: Michael Snyder, male    DOB: 16-Oct-1950, 69 y.o.   MRN: [id number]  HPI Pt returns for f/u of diabetes mellitus:  DM type: 2.  Dx'ed: 6153 Complications: CRI.   Therapy: 3 oral meds DKA: never Severe hypoglycemia: never Pancreatitis: never Pancreatic imaging: CT (2020): Fatty atrophy. Coarse calcification of the pancreatic duct and dorsal duct, and dilatation of the main pancreatic duct.    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  Interval history: no cbg record, but states cbg's vary from 94-112.  pt states he feels well in general.  He takes meds as rx'ed.    Past Medical History:  Diagnosis Date  . Alcohol abuse   . Arthritis   . Diabetes mellitus, type 2 ([place])   . Head injury, closed, without LOC 1980s  . History of alcohol abuse   . Hypertension   . 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 Pacemaker    [place]  . Seizure disorder ([place])   . Seizures ([place])    last time in 1990's    Past Surgical History:  Procedure Laterality Date  . LACERATION REPAIR  1980   scalp  . PACEMAKER LEAD REMOVAL N/A 03/23/2014   Procedure: PACEMAKER LEAD REMOVAL;  Surgeon: Evans Lance, MD;  Location: [facility];  Service: Cardiovascular;  Laterality: N/A;  . PTDVP     7/08 medtronic DDD    Social History   Socioeconomic History  . Marital status: Legally Separated    Spouse name: Not on file  . Number of children: 3  . Years of education: Not on file  . Highest education level: Not on file  Occupational History  . Occupation: Games developer, truck Geophysicist/field seismologist  Tobacco Use  . Smoking status: Never Smoker  . Smokeless tobacco: Never Used  Substance and Sexual Activity  . Alcohol use: No    Comment: quit 20 yrs ago  . Drug use: No  . Sexual activity: Yes    Partners: Female  Other Topics Concern  . Not on file  Social History Narrative   12th  grade education. H/O substance abuse- now abstemious and works with other in recovery..Attends NA on a regular basis and serves as a sponsor. Married 18 years-divorced. Married 5 years 2nd marriage.3 daughters previous marriage, 9 grandchildren, 3 great-grands. 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 Difficulty of Paying Living Expenses:   Food Insecurity:   . Worried About Charity fundraiser in the Last Year:   . Arboriculturist in the Last Year:   Transportation Needs:   . Film/video editor (Medical):   [name] Lack of Transportation (Non-Medical):   Physical Activity: Sufficiently Active  . Days of Exercise per Week: 5 days  . Minutes of Exercise per Session: 80 min  Stress:   . Feeling of Stress :   Social Connections: Unknown  . Frequency of Communication with Friends and Family: Not on file  . Frequency of Social Gatherings with Friends and Family: Not on file  . Attends Religious Services: Not on file  . Active Member of Clubs or Organizations: Not on file  . Attends Archivist Meetings: Not on file  . Marital  Status: Separated  Intimate Partner Violence: Unknown  . Fear of Current or Ex-Partner: Not asked  . Emotionally Abused: Not asked  . Physically Abused: Not asked  . Sexually Abused: Not asked    Current Outpatient Medications on File Prior to Visit  Medication Sig Dispense Refill  . ALPHAGAN P 0.1 % SOLN Place 2 drops into the left eye daily.    [name] amLODipine (NORVASC) 5 MG tablet Take 1 tablet (5 mg total) by mouth daily. 90 tablet 1  . aspirin EC 81 MG tablet Take 81 mg daily by mouth.    [name] atorvastatin (LIPITOR) 20 MG tablet Take 1 tablet (20 mg total) by mouth daily. 90 tablet 3  . Blood Pressure Monitoring (BLOOD PRESSURE KIT) DEVI 1 kit by Does not apply route daily. 1 each 0  . Calcium Carbonate-Vitamin D (CALTRATE 600+D) 600-400 MG-UNIT per tablet Take 1 tablet by mouth 2 (two)  times daily. 180 tablet 3  . diclofenac (VOLTAREN) 75 MG EC tablet Take 1 tablet by mouth twice daily 60 tablet 2  . gabapentin (NEURONTIN) 300 MG capsule Take 300 mg by mouth every 6 (six) hours as needed (pain).     [name] glipiZIDE (GLUCOTROL) 5 MG tablet Take 1 tablet (5 mg total) by mouth daily before breakfast. 90 tablet 3  . hydrochlorothiazide (HYDRODIURIL) 25 MG tablet Take 1 tablet (25 mg total) by mouth daily. 90 tablet 3  . hydrochlorothiazide (MICROZIDE) 12.5 MG capsule Take 1 capsule by mouth once daily 90 capsule 1  . ibuprofen (ADVIL) 600 MG tablet Take 1 tablet (600 mg total) by mouth every 6 (six) hours as needed. 30 tablet 0  . Lancets (ONETOUCH DELICA PLUS [id number]) MISC CHECK BLOOD SUGAR TWO TIMES DAILY 200 each 3  . Multiple Vitamin (MULTIVITAMIN WITH MINERALS) TABS tablet Take 1 tablet by mouth daily.    . Omega-3 Fatty Acids (FISH OIL) 300 MG CAPS Take 1 capsule (300 mg total) by mouth daily. 90 capsule 3  . ONETOUCH VERIO test strip TEST TWICE DAILY 200 strip 3  . pioglitazone (ACTOS) 30 MG tablet TAKE 1 TABLET BY MOUTH  DAILY DUE FOR ANNUAL VISIT  WITH LABS 90 tablet 3  . potassium chloride SA (KLOR-CON) 20 MEQ tablet TAKE 1 TABLET BY MOUTH  TWICE DAILY 180 tablet 3  . Semaglutide (RYBELSUS) 14 MG TABS Take 14 mg by mouth daily. 90 tablet 3  . triamcinolone ointment (KENALOG) 0.5 % APPLY  OINTMENT TOPICALLY TWICE DAILY 90 g 0   No current facility-administered medications on file prior to visit.    Allergies  Allergen Reactions  . Clindamycin/Lincomycin Swelling    Possible facial and lip swelling  . Ace Inhibitors     Facial and lip swelling possibly associated    Family History  Problem Relation Age of Onset  . Hypertension Mother   . COPD Mother   . Hypertension Brother   . Cancer Brother        leukemia  . Hypertension Brother   . Diabetes Neg Hx     BP 120/74   Pulse 80   Ht 6' (1.829 m)   Wt 177 lb (80.3 kg)   SpO2 99%   BMI 24.01 kg/m     Review of Systems He denies hypoglycemia.     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   Patient Instructions  I have sent a prescription to your pharmacy, to start insulin. Please continue the same other medications for now. check your blood sugar twice a day.  vary the time of day when you check, between before the 3 meals, and at bedtime.  also check if you have symptoms of your blood sugar being too high or too low.  please keep a record of the readings and bring it to your next appointment here (or you can bring the meter itself).  You can write it on any piece of paper.  please call us sooner if your blood sugar goes below 70, or if you have a lot of readings over 200. Please come back for a follow-up appointment in 2-3 weeks.  Please bring your meter and a strip, so we can compare to ours.

## 2019-10-09 ENCOUNTER — Other Ambulatory Visit: Payer: Self-pay | Admitting: Internal Medicine

## 2019-10-16 ENCOUNTER — Other Ambulatory Visit: Payer: Self-pay

## 2019-10-17 DIAGNOSIS — E119 Type 2 diabetes mellitus without complications: Secondary | ICD-10-CM | POA: Diagnosis not present

## 2019-10-17 DIAGNOSIS — Z961 Presence of intraocular lens: Secondary | ICD-10-CM | POA: Diagnosis not present

## 2019-10-17 DIAGNOSIS — H401211 Low-tension glaucoma, right eye, mild stage: Secondary | ICD-10-CM | POA: Diagnosis not present

## 2019-10-17 DIAGNOSIS — H5203 Hypermetropia, bilateral: Secondary | ICD-10-CM | POA: Diagnosis not present

## 2019-10-17 DIAGNOSIS — H401222 Low-tension glaucoma, left eye, moderate stage: Secondary | ICD-10-CM | POA: Diagnosis not present

## 2019-10-17 DIAGNOSIS — H35033 Hypertensive retinopathy, bilateral: Secondary | ICD-10-CM | POA: Diagnosis not present

## 2019-10-18 ENCOUNTER — Encounter: Payer: Self-pay | Admitting: Endocrinology

## 2019-10-18 ENCOUNTER — Other Ambulatory Visit: Payer: Self-pay

## 2019-10-18 ENCOUNTER — Ambulatory Visit: Payer: Medicare Other | Admitting: Endocrinology

## 2019-10-18 VITALS — BP 142/88 | HR 86 | Ht 72.0 in | Wt 180.0 lb

## 2019-10-18 DIAGNOSIS — E119 Type 2 diabetes mellitus without complications: Secondary | ICD-10-CM | POA: Diagnosis not present

## 2019-10-18 DIAGNOSIS — Z794 Long term (current) use of insulin: Secondary | ICD-10-CM

## 2019-10-18 LAB — GLUCOSE, POCT (MANUAL RESULT ENTRY): POC Glucose: 205 mg/dl — AB (ref 70–99)

## 2019-10-18 NOTE — Progress Notes (Signed)
Pt presents today for f/u DM appt with Dr. Everardo All. Per Dr. George Hugh orders, I was asked to obtain CBG with our office glucometer with results of 205. Pt was instructed by Dr. Everardo All to also obtain CBG using his personal glucometer for comparison. Pt personal glucometer resulted @ 203. Dr. Everardo All made aware of BOTH results.

## 2019-10-18 NOTE — Patient Instructions (Addendum)
Please hold off on the insulin for now, and continue the same other diabetes medications. check your blood sugar twice a day.  vary the time of day when you check, between before the 3 meals, and at bedtime.  also check if you have symptoms of your blood sugar being too high or too low.  please keep a record of the readings and bring it to your next appointment here (or you can bring the meter itself).  You can write it on any piece of paper.  please call us sooner if your blood sugar goes below 70, or if you have a lot of readings over 200. Please come back for a follow-up appointment in 6 weeks.

## 2019-10-18 NOTE — Progress Notes (Signed)
 Subjective:    Patient ID: Michael Snyder, male    DOB: 05/26/1951, 69 y.o.   MRN: [id number]  HPI Pt returns for f/u of diabetes mellitus:  DM type: Insulin-requiring type 2.   Dx'ed: 2008 Complications: CRI.   Therapy: insulin since 2021, and 3 oral meds DKA: never Severe hypoglycemia: never Pancreatitis: never Pancreatic imaging: CT (2020): Fatty atrophy. Coarse calcification of the pancreatic duct and dorsal duct, and dilatation of the main pancreatic duct.    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   Interval history: He brings his meter with his cbg's which I have reviewed today.  cbg varies from 76-208.  Pt says he did not start the insulin, as cbg's have improved to approx 100.   Past Medical History:  Diagnosis Date  . Alcohol abuse   . Arthritis   . Diabetes mellitus, type 2 (HCC)   . Head injury, closed, without LOC 1980s  . History of alcohol abuse   . Hypertension   . 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 Pacemaker    Medtronic Adapta [id number]  . Seizure disorder (HCC)   . Seizures (HCC)    last time in 1990's    Past Surgical History:  Procedure Laterality Date  . LACERATION REPAIR  1980   scalp  . PACEMAKER LEAD REMOVAL N/A 03/23/2014   Procedure: PACEMAKER LEAD REMOVAL;  Surgeon: Gregg W Taylor, MD;  Location: [facility];  Service: Cardiovascular;  Laterality: N/A;  . PTDVP     7/08 medtronic DDD    Social History   Socioeconomic History  . Marital status: Legally Separated    Spouse name: Not on file  . Number of children: 3  . Years of education: Not on file  . Highest education level: Not on file  Occupational History  . Occupation: carpenter, truck driver  Tobacco Use  . Smoking status: Never Smoker  . Smokeless tobacco: Never Used  Substance and Sexual Activity  . Alcohol use: No    Comment: quit 20 yrs ago  . Drug use: No  . Sexual  activity: Yes    Partners: Female  Other Topics Concern  . Not on file  Social History Narrative   12th grade education. H/O substance abuse- now abstemious and works with other in recovery..Attends NA on a regular basis and serves as a sponsor. Married 18 years-divorced. Married 5 years 2nd marriage.3 daughters previous marriage, 9 grandchildren, 3 great-grands. Work- short haul truck driver            Social Determinants of Health   Financial Resource Strain:   . Difficulty of Paying Living Expenses:   Food Insecurity:   . Worried About Running Out of Food in the Last Year:   . Ran Out of Food in the Last Year:   Transportation Needs:   . Lack of Transportation (Medical):   . Lack of Transportation (Non-Medical):   Physical Activity: Sufficiently Active  . Days of Exercise per Week: 5 days  . Minutes of Exercise per Session: 80 min  Stress:   . Feeling of Stress :   Social Connections: Unknown  . Frequency of Communication with Friends and Family: Not on file  . Frequency of Social Gatherings with Friends and Family: Not on file  . Attends Religious Services: Not on file  . Active Member of Clubs or   Organizations: Not on file  . Attends Archivist Meetings: Not on file  . Marital Status: Separated  Intimate Partner Violence: Unknown  . Fear of Current or Ex-Partner: Not asked  . Emotionally Abused: Not asked  . Physically Abused: Not asked  . Sexually Abused: Not asked    Current Outpatient Medications on File Prior to Visit  Medication Sig Dispense Refill  . ALPHAGAN P 0.1 % SOLN Place 2 drops into the left eye daily.    [name] amLODipine (NORVASC) 5 MG tablet Take 1 tablet (5 mg total) by mouth daily. 90 tablet 1  . aspirin EC 81 MG tablet Take 81 mg daily by mouth.    [name] atorvastatin (LIPITOR) 20 MG tablet Take 1 tablet (20 mg total) by mouth daily. 90 tablet 3  . Blood Pressure Monitoring (BLOOD PRESSURE KIT) DEVI 1 kit by Does not apply route daily. 1 each 0   . Calcium Carbonate-Vitamin D (CALTRATE 600+D) 600-400 MG-UNIT per tablet Take 1 tablet by mouth 2 (two) times daily. 180 tablet 3  . diclofenac (VOLTAREN) 75 MG EC tablet Take 1 tablet by mouth twice daily 60 tablet 2  . gabapentin (NEURONTIN) 300 MG capsule Take 300 mg by mouth every 6 (six) hours as needed (pain).     [name] glipiZIDE (GLUCOTROL) 5 MG tablet Take 1 tablet (5 mg total) by mouth daily before breakfast. 90 tablet 3  . hydrochlorothiazide (HYDRODIURIL) 25 MG tablet Take 1 tablet (25 mg total) by mouth daily. 90 tablet 3  . hydrochlorothiazide (MICROZIDE) 12.5 MG capsule Take 1 capsule by mouth once daily 90 capsule 1  . ibuprofen (ADVIL) 600 MG tablet Take 1 tablet (600 mg total) by mouth every 6 (six) hours as needed. 30 tablet 0  . Lancets (ONETOUCH DELICA PLUS [id number]) MISC CHECK BLOOD SUGAR TWO TIMES DAILY 200 each 3  . Multiple Vitamin (MULTIVITAMIN WITH MINERALS) TABS tablet Take 1 tablet by mouth daily.    . Omega-3 Fatty Acids (FISH OIL) 300 MG CAPS Take 1 capsule (300 mg total) by mouth daily. 90 capsule 3  . ONETOUCH VERIO test strip TEST TWICE DAILY 200 strip 3  . pioglitazone (ACTOS) 30 MG tablet TAKE 1 TABLET BY MOUTH  DAILY DUE FOR ANNUAL VISIT  WITH LABS 90 tablet 3  . potassium chloride SA (KLOR-CON) 20 MEQ tablet TAKE 1 TABLET BY MOUTH  TWICE DAILY 180 tablet 3  . Semaglutide (RYBELSUS) 14 MG TABS Take 14 mg by mouth daily. 90 tablet 3  . triamcinolone ointment (KENALOG) 0.5 % APPLY TOPICALLY TWICE DAILY 90 g 0   No current facility-administered medications on file prior to visit.    Allergies  Allergen Reactions  . Clindamycin/Lincomycin Swelling    Possible facial and lip swelling  . Ace Inhibitors     Facial and lip swelling possibly associated    Family History  Problem Relation Age of Onset  . Hypertension Mother   . COPD Mother   . Hypertension Brother   . Cancer Brother        leukemia  . Hypertension Brother   . Diabetes Neg Hx     BP (!)  142/88   Pulse 86   Ht 6' (1.829 m)   Wt 180 lb (81.6 kg)   SpO2 98%   BMI 24.41 kg/m   Review of Systems He denies hypoglycemia.      Objective:   Physical Exam VITAL SIGNS:  See vs page GENERAL: no distress Pulses: dorsalis pedis  intact bilat.   MSK: no deformity of the feet CV: no leg edema Skin:  no ulcer on the feet.  normal color and temp on the feet. Neuro: sensation is intact to touch on the feet  Lab Results  Component Value Date   HGBA1C 9.0 (A) 09/22/2019   Lab Results  Component Value Date   CREATININE 1.66 (H) 09/22/2019   BUN 15 09/22/2019   NA 137 09/22/2019   K 4.4 09/22/2019   CL 106 09/22/2019   CO2 28 09/22/2019       Assessment & Plan:  Type 2 DM: Our office cbg is similar to pt's.  Glycemic control is apparently improved  Patient Instructions  Please hold off on the insulin for now, and continue the same other diabetes medications. check your blood sugar twice a day.  vary the time of day when you check, between before the 3 meals, and at bedtime.  also check if you have symptoms of your blood sugar being too high or too low.  please keep a record of the readings and bring it to your next appointment here (or you can bring the meter itself).  You can write it on any piece of paper.  please call us sooner if your blood sugar goes below 70, or if you have a lot of readings over 200. Please come back for a follow-up appointment in 6 weeks.

## 2019-11-03 ENCOUNTER — Telehealth: Payer: Self-pay

## 2019-11-03 ENCOUNTER — Encounter: Payer: Medicare Other | Attending: Endocrinology | Admitting: Dietician

## 2019-11-03 ENCOUNTER — Ambulatory Visit (INDEPENDENT_AMBULATORY_CARE_PROVIDER_SITE_OTHER): Payer: Medicare Other | Admitting: *Deleted

## 2019-11-03 DIAGNOSIS — I495 Sick sinus syndrome: Secondary | ICD-10-CM | POA: Diagnosis not present

## 2019-11-03 DIAGNOSIS — E119 Type 2 diabetes mellitus without complications: Secondary | ICD-10-CM | POA: Insufficient documentation

## 2019-11-03 LAB — CUP PACEART REMOTE DEVICE CHECK
Battery Impedance: 280 Ohm
Battery Remaining Longevity: 123 mo
Battery Voltage: 2.79 V
Brady Statistic AP VP Percent: 0 %
Brady Statistic AP VS Percent: 6 %
Brady Statistic AS VP Percent: 1 %
Brady Statistic AS VS Percent: 93 %
Date Time Interrogation Session: 20210430145539
Implantable Lead Implant Date: 20080711
Implantable Lead Implant Date: 20150918
Implantable Lead Location: 753859
Implantable Lead Location: 753860
Implantable Lead Model: 5076
Implantable Lead Model: 5076
Implantable Pulse Generator Implant Date: 20150918
Lead Channel Impedance Value: 465 Ohm
Lead Channel Impedance Value: 495 Ohm
Lead Channel Pacing Threshold Amplitude: 0.625 V
Lead Channel Pacing Threshold Amplitude: 0.875 V
Lead Channel Pacing Threshold Pulse Width: 0.4 ms
Lead Channel Pacing Threshold Pulse Width: 0.4 ms
Lead Channel Setting Pacing Amplitude: 2 V
Lead Channel Setting Pacing Amplitude: 2.5 V
Lead Channel Setting Pacing Pulse Width: 0.4 ms
Lead Channel Setting Sensing Sensitivity: 2 mV

## 2019-11-03 NOTE — Telephone Encounter (Signed)
Spoke with patient to remind of missed remote transmission 

## 2019-11-03 NOTE — Telephone Encounter (Signed)
Called patient in regards to scheduled remote transmission on 11/03/19. 23 HVR noted (longest 1 hour 10 minutes, fastest 167 bpm). Patient denies any complaints during episodes. States he was outside that day playing with his dogs. Advised patient to call with further questions or concerns, verbalizes understanding. Will review with Dr. Graciela Husbands.

## 2019-11-03 NOTE — Progress Notes (Signed)
PPM Remote  

## 2019-11-09 NOTE — Telephone Encounter (Signed)
11/07/19- Reviewed information with Dr. Graciela Husbands. No further orders for writer at this time.

## 2019-11-12 IMAGING — CT CT RENAL STONE PROTOCOL
2 of 4 series · 15 of 46 positions shown, 17 images · non-contrast
Comparison: Abdominal ultrasound dated 03/17/2016

CLINICAL DATA: 68-year-old male with right-sided flank pain.
Concern for kidney stone.

EXAM:
CT ABDOMEN AND PELVIS WITHOUT CONTRAST
TECHNIQUE: Multidetector CT imaging of the abdomen and pelvis was performed
following the standard protocol without IV contrast.

[Series 2: axial st · axial · 0.75mm/px · z∈[+864,+1284]mm · 12 of 96 slices shown, 14 images]
[im 8/96  soft-tissue]
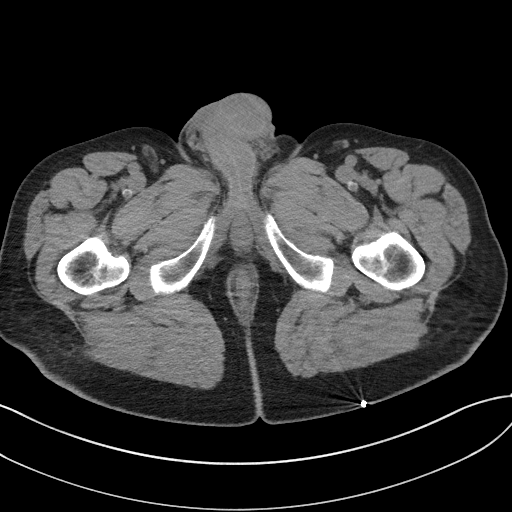
[im 8/96  bone]
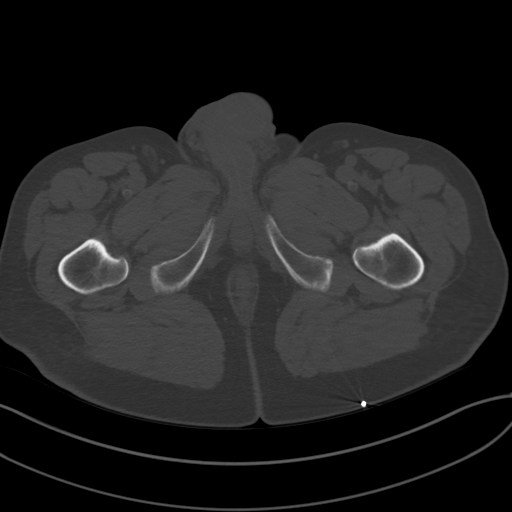
[im 16/96  soft-tissue]
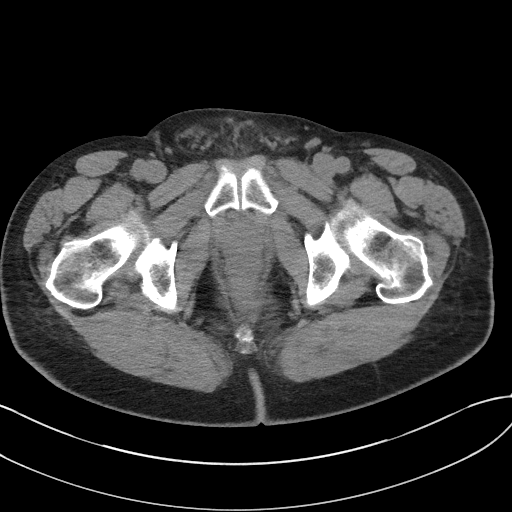
[im 23/96  soft-tissue]
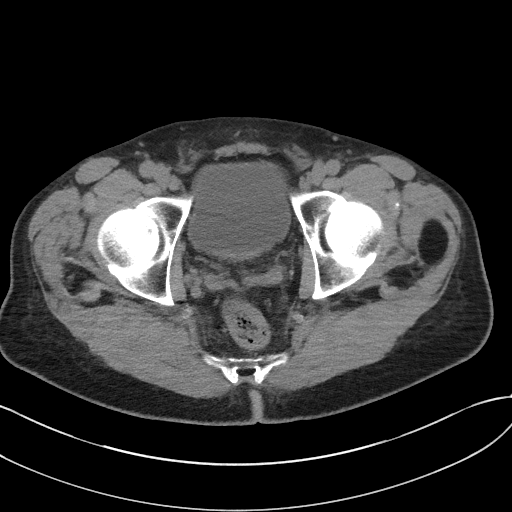
[im 31/96  soft-tissue]
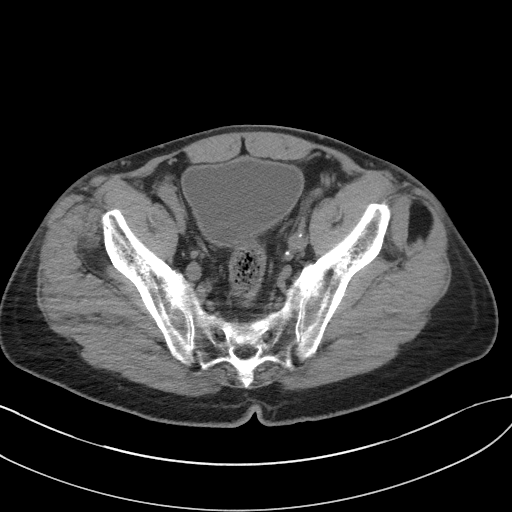
[im 39/96  soft-tissue]
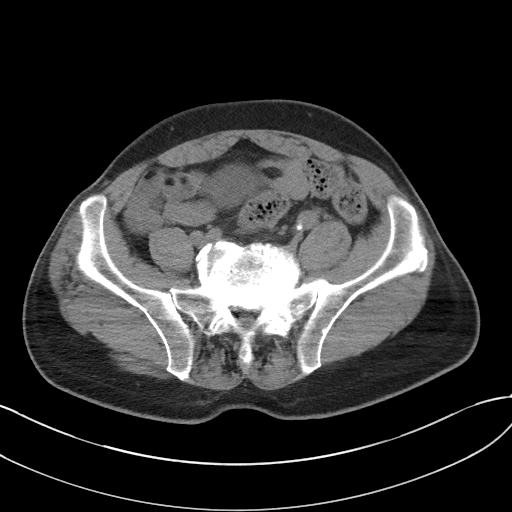
[im 46/96  soft-tissue]
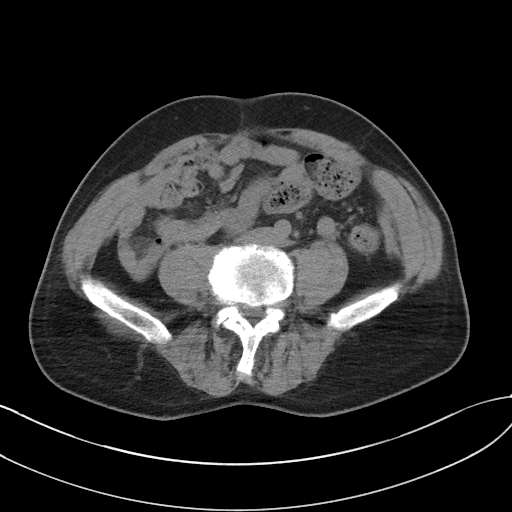
[im 54/96  soft-tissue]
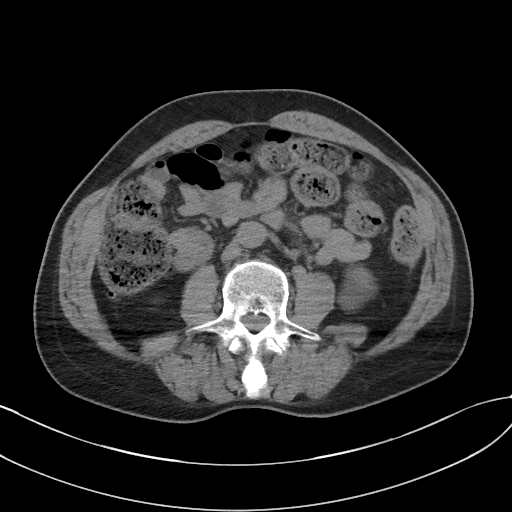
[im 61/96  soft-tissue]
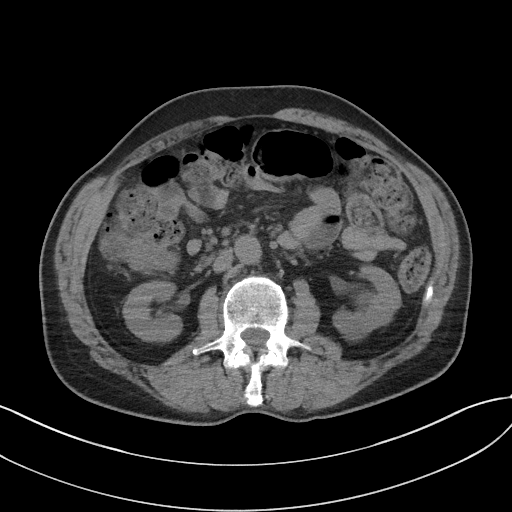
[im 69/96  soft-tissue]
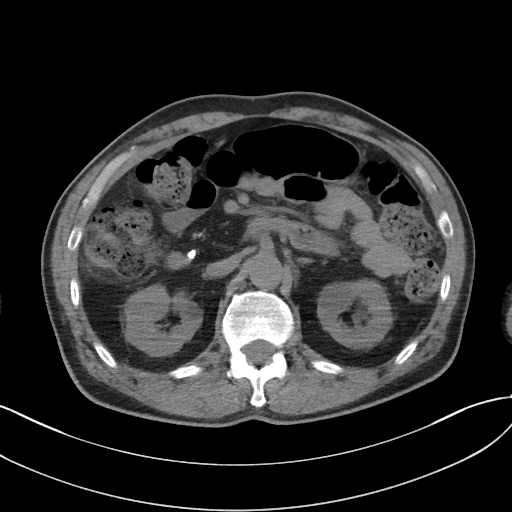
[im 69/96  bone]
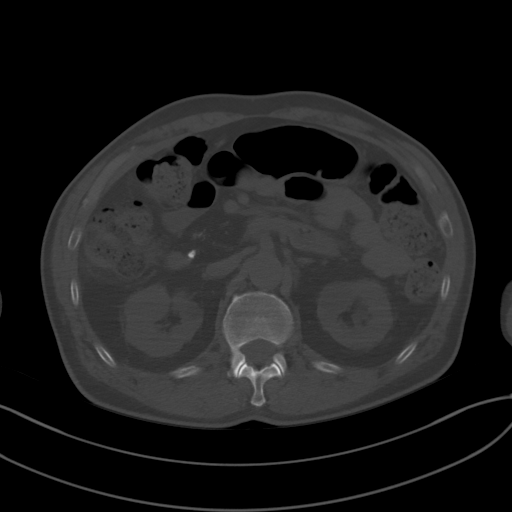
[im 77/96  soft-tissue]
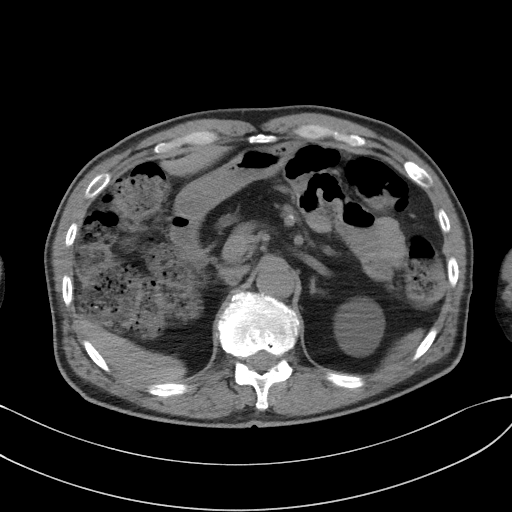
[im 84/96  soft-tissue]
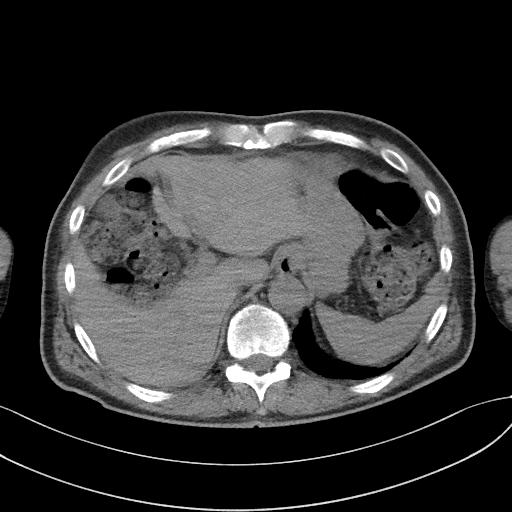
[im 92/96  soft-tissue]
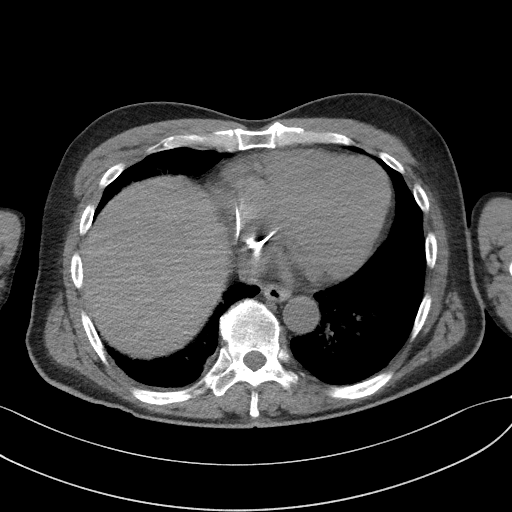

[Series 5: coronal st · coronal · 0.88mm/px · 3 of 101 slices shown]
[im 34/101  soft-tissue]
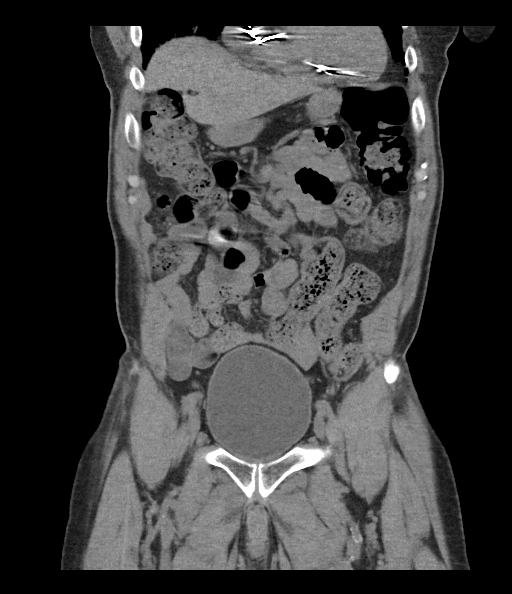
[im 45/101  soft-tissue]
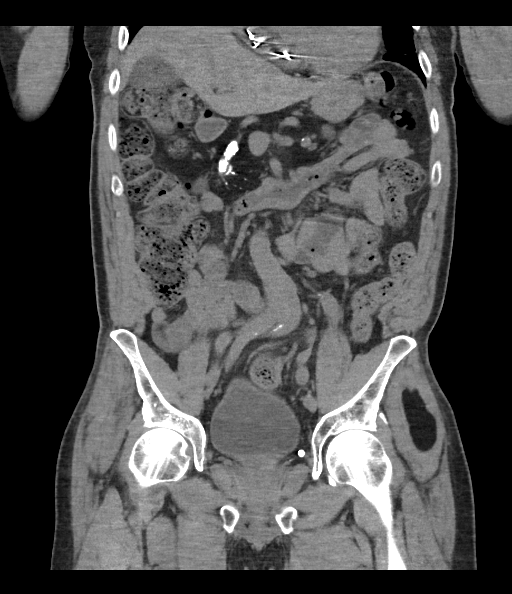
[im 56/101  soft-tissue]
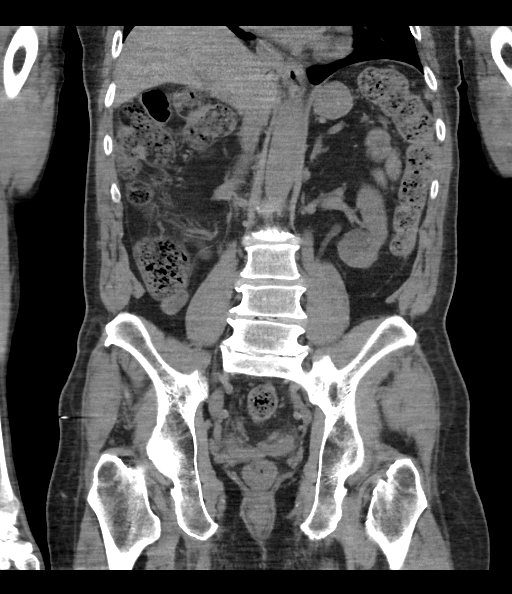

[15 of 46 positions shown; findings below may reference images not displayed]

FINDINGS: Evaluation of this exam is limited in the absence of intravenous
contrast.

Lower chest: Bibasilar atelectatic changes. Patchy area of density
at the right lung base, likely atelectasis. Developing infiltrate is
not excluded. Clinical correlation is recommended. There is a trace
right pleural effusion versus pleural thickening. Mild cardiomegaly.
Cardiac pacemaker wires noted.

No intra-abdominal free air or free fluid.

Hepatobiliary: The liver is grossly unremarkable. No intrahepatic
biliary ductal dilatation. Multiple small stones noted within the
gallbladder. No pericholecystic fluid or evidence of acute
cholecystitis by CT.

Pancreas: Fatty atrophy of the pancreas. There is coarse
calcification of the proximal main pancreatic duct as well as the
dorsal duct. There is dilatation of the main pancreatic duct
measuring up to 9 mm in diameter. MRCP may provide better evaluation
on a nonemergent basis.

Spleen: Normal in size without focal abnormality.

Adrenals/Urinary Tract: The adrenal glands are unremarkable. Several
bilateral renal hypodense lesions measure up to 5 cm in the upper
pole of the left kidney. These lesions are suboptimally
characterized on this noncontrast CT but demonstrate fluid
attenuation most consistent with cysts. Several bilateral
nonobstructing renal calculi noted measure up to 7-8 mm in the
inferior pole of the left kidney. There is no hydronephrosis on
either side. The visualized ureters and urinary bladder appear
unremarkable.

Stomach/Bowel: Moderate amount stool noted throughout the colon.
There is no bowel obstruction or active inflammation. The appendix
is normal.

Vascular/Lymphatic: Mild aortoiliac atherosclerotic disease. The IVC
is unremarkable. No portal venous gas. There is no adenopathy.

Reproductive: The prostate and seminal vesicles are grossly
unremarkable. No pelvic mass.

Other: Small fat containing umbilical hernia.

Musculoskeletal: Degenerative changes of the spine. No acute osseous
pathology.
IMPRESSION: 1. Bilateral nonobstructing renal calculi.  No hydronephrosis.
2. Cholelithiasis without evidence of acute cholecystitis by CT.
3. Coarse calcification of the proximal pancreatic duct with
associated dilatation of the main pancreatic duct measuring up to 9
mm. There is fatty atrophy of the pancreas.
4. Constipation. No bowel obstruction or active inflammation. Normal
appendix.

Aortic Atherosclerosis ([id number]-XYO.O).

## 2019-11-22 ENCOUNTER — Other Ambulatory Visit: Payer: Self-pay

## 2019-11-24 ENCOUNTER — Ambulatory Visit: Payer: Medicare Other | Admitting: Endocrinology

## 2019-11-24 ENCOUNTER — Other Ambulatory Visit: Payer: Self-pay

## 2019-11-24 ENCOUNTER — Encounter: Payer: Self-pay | Admitting: Endocrinology

## 2019-11-24 VITALS — BP 160/100 | HR 85 | Ht 72.0 in | Wt 178.0 lb

## 2019-11-24 DIAGNOSIS — Z794 Long term (current) use of insulin: Secondary | ICD-10-CM

## 2019-11-24 DIAGNOSIS — E119 Type 2 diabetes mellitus without complications: Secondary | ICD-10-CM | POA: Diagnosis not present

## 2019-11-24 LAB — POCT GLYCOSYLATED HEMOGLOBIN (HGB A1C): Hemoglobin A1C: 9.1 % — AB (ref 4.0–5.6)

## 2019-11-24 MED ORDER — LANTUS SOLOSTAR 100 UNIT/ML ~~LOC~~ SOPN: 10.0000 [IU] | PEN_INJECTOR | Freq: Every day | SUBCUTANEOUS | 99 refills | Status: DC

## 2019-11-24 NOTE — Progress Notes (Signed)
Subjective:    Patient ID: Michael Snyder, male    DOB: Aug 10, 1950, 69 y.o.   MRN: [id number]  HPI Pt returns for f/u of diabetes mellitus:  DM type: Insulin-requiring type 2.   Dx'ed: 7829 Complications: CRI.   Therapy: insulin since 2021, and 3 oral meds DKA: never Severe hypoglycemia: never Pancreatitis: never Pancreatic imaging: CT (2020): Fatty atrophy. Coarse calcification of the pancreatic duct and dorsal duct, and dilatation of the main pancreatic duct.    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   Interval history: He brings his meter with his cbg's which I have reviewed today.  cbg varies from 76-208.  Pt says he did not start the insulin, as cbg's have improved to approx 100.  Pt says he did not take BP rx today. Past Medical History:  Diagnosis Date  . Alcohol abuse   . Arthritis   . Diabetes mellitus, type 2 ([place])   . Head injury, closed, without LOC 1980s  . History of alcohol abuse   . Hypertension   . 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 Pacemaker    [place]  . Seizure disorder ([place])   . Seizures ([place])    last time in 1990's    Past Surgical History:  Procedure Laterality Date  . LACERATION REPAIR  1980   scalp  . PACEMAKER LEAD REMOVAL N/A 03/23/2014   Procedure: PACEMAKER LEAD REMOVAL;  Surgeon: Evans Lance, MD;  Location: [facility];  Service: Cardiovascular;  Laterality: N/A;  . PTDVP     7/08 medtronic DDD    Social History   Socioeconomic History  . Marital status: Legally Separated    Spouse name: Not on file  . Number of children: 3  . Years of education: Not on file  . Highest education level: Not on file  Occupational History  . Occupation: Games developer, truck Geophysicist/field seismologist  Tobacco Use  . Smoking status: Never Smoker  . Smokeless tobacco: Never Used  Substance and Sexual Activity  . Alcohol use: No    Comment: quit 20 yrs ago    . Drug use: No  . Sexual activity: Yes    Partners: Female  Other Topics Concern  . Not on file  Social History Narrative   12th grade education. H/O substance abuse- now abstemious and works with other in recovery..Attends NA on a regular basis and serves as a sponsor. Married 18 years-divorced. Married 5 years 2nd marriage.3 daughters previous marriage, 9 grandchildren, 3 great-grands. 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 Difficulty of Paying Living Expenses:   Food Insecurity:   . Worried About Charity fundraiser in the Last Year:   . Arboriculturist in the Last Year:   Transportation Needs:   . Film/video editor (Medical):   [name] Lack of Transportation (Non-Medical):   Physical Activity: Sufficiently Active  . Days of Exercise per Week: 5 days  . Minutes of Exercise per Session: 80 min  Stress:   . Feeling of Stress :   Social Connections: Unknown  . Frequency of Communication with Friends and Family: Not on file  . Frequency of Social Gatherings with Friends and Family: Not on file  . Attends Religious Services: Not  on file  . Active Member of Clubs or Organizations: Not on file  . Attends Archivist Meetings: Not on file  . Marital Status: Separated  Intimate Partner Violence: Unknown  . Fear of Current or Ex-Partner: Not asked  . Emotionally Abused: Not asked  . Physically Abused: Not asked  . Sexually Abused: Not asked    Current Outpatient Medications on File Prior to Visit  Medication Sig Dispense Refill  . ALPHAGAN P 0.1 % SOLN Place 2 drops into the left eye daily.    [name] amLODipine (NORVASC) 5 MG tablet Take 1 tablet (5 mg total) by mouth daily. 90 tablet 1  . aspirin EC 81 MG tablet Take 81 mg daily by mouth.    [name] atorvastatin (LIPITOR) 20 MG tablet Take 1 tablet (20 mg total) by mouth daily. 90 tablet 3  . Blood Pressure Monitoring (BLOOD PRESSURE KIT) DEVI 1 kit by Does not  apply route daily. 1 each 0  . Calcium Carbonate-Vitamin D (CALTRATE 600+D) 600-400 MG-UNIT per tablet Take 1 tablet by mouth 2 (two) times daily. 180 tablet 3  . diclofenac (VOLTAREN) 75 MG EC tablet Take 1 tablet by mouth twice daily 60 tablet 2  . gabapentin (NEURONTIN) 300 MG capsule Take 300 mg by mouth every 6 (six) hours as needed (pain).     [name] glipiZIDE (GLUCOTROL) 5 MG tablet Take 1 tablet (5 mg total) by mouth daily before breakfast. 90 tablet 3  . hydrochlorothiazide (HYDRODIURIL) 25 MG tablet Take 1 tablet (25 mg total) by mouth daily. 90 tablet 3  . hydrochlorothiazide (MICROZIDE) 12.5 MG capsule Take 1 capsule by mouth once daily 90 capsule 1  . ibuprofen (ADVIL) 600 MG tablet Take 1 tablet (600 mg total) by mouth every 6 (six) hours as needed. 30 tablet 0  . Lancets (ONETOUCH DELICA PLUS [id number]) MISC CHECK BLOOD SUGAR TWO TIMES DAILY 200 each 3  . Multiple Vitamin (MULTIVITAMIN WITH MINERALS) TABS tablet Take 1 tablet by mouth daily.    . Omega-3 Fatty Acids (FISH OIL) 300 MG CAPS Take 1 capsule (300 mg total) by mouth daily. 90 capsule 3  . ONETOUCH VERIO test strip TEST TWICE DAILY 200 strip 3  . pioglitazone (ACTOS) 30 MG tablet TAKE 1 TABLET BY MOUTH  DAILY DUE FOR ANNUAL VISIT  WITH LABS 90 tablet 3  . potassium chloride SA (KLOR-CON) 20 MEQ tablet TAKE 1 TABLET BY MOUTH  TWICE DAILY 180 tablet 3  . Semaglutide (RYBELSUS) 14 MG TABS Take 14 mg by mouth daily. 90 tablet 3  . triamcinolone ointment (KENALOG) 0.5 % APPLY TOPICALLY TWICE DAILY 90 g 0   No current facility-administered medications on file prior to visit.    Allergies  Allergen Reactions  . Clindamycin/Lincomycin Swelling    Possible facial and lip swelling  . Ace Inhibitors     Facial and lip swelling possibly associated    Family History  Problem Relation Age of Onset  . Hypertension Mother   . COPD Mother   . Hypertension Brother   . Cancer Brother        leukemia  . Hypertension Brother   .  Diabetes Neg Hx     BP (!) 160/100 (BP Location: Right Arm, Cuff Size: Normal)   Pulse 85   Ht 6' (1.829 m)   Wt 178 lb (80.7 kg)   SpO2 98%   BMI 24.14 kg/m    Review of Systems He denies hypoglycemia.  Objective:   Physical Exam VITAL SIGNS:  See vs page GENERAL: no distress Pulses: dorsalis pedis intact bilat.   MSK: no deformity of the feet CV: no leg edema.  Skin:  no ulcer on the feet.  normal color and temp on the feet.   Neuro: sensation is intact to touch on the feet.  Ext: there is bilateral onychomycosis of the toenails.     Lab Results  Component Value Date   HGBA1C 9.1 (A) 11/24/2019   Lab Results  Component Value Date   CREATININE 1.66 (H) 09/22/2019   BUN 15 09/22/2019   NA 137 09/22/2019   K 4.4 09/22/2019   CL 106 09/22/2019   CO2 28 09/22/2019       Assessment & Plan:  HTN: is noted today, worse Insulin-requiring type 2 DM, with CRI: worse.   Patient Instructions  Your blood pressure is high today.  It is critically important to not miss your medication.  Please see your primary care provider within 5 days, to have it rechecked Please take the insulin, and continue the same other diabetes medications.   check your blood sugar twice a day.  vary the time of day when you check, between before the 3 meals, and at bedtime.  also check if you have symptoms of your blood sugar being too high or too low.  please keep a record of the readings and bring it to your next appointment here (or you can bring the meter itself).  You can write it on any piece of paper.  please call us sooner if your blood sugar goes below 70, or if you have a lot of readings over 200. Please come back for a follow-up appointment in 2 months.

## 2019-11-24 NOTE — Patient Instructions (Addendum)
Your blood pressure is high today.  It is critically important to not miss your medication.  Please see your primary care provider within 5 days, to have it rechecked Please take the insulin, and continue the same other diabetes medications.   check your blood sugar twice a day.  vary the time of day when you check, between before the 3 meals, and at bedtime.  also check if you have symptoms of your blood sugar being too high or too low.  please keep a record of the readings and bring it to your next appointment here (or you can bring the meter itself).  You can write it on any piece of paper.  please call us sooner if your blood sugar goes below 70, or if you have a lot of readings over 200. Please come back for a follow-up appointment in 2 months.

## 2019-11-27 ENCOUNTER — Other Ambulatory Visit: Payer: Self-pay | Admitting: Internal Medicine

## 2019-11-27 ENCOUNTER — Other Ambulatory Visit: Payer: Self-pay

## 2019-11-27 ENCOUNTER — Encounter: Payer: Self-pay | Admitting: Internal Medicine

## 2019-11-27 ENCOUNTER — Ambulatory Visit (INDEPENDENT_AMBULATORY_CARE_PROVIDER_SITE_OTHER): Payer: Medicare Other | Admitting: Internal Medicine

## 2019-11-27 VITALS — BP 140/100 | HR 80 | Temp 98.0°F | Ht 72.0 in | Wt 178.0 lb

## 2019-11-27 DIAGNOSIS — I10 Essential (primary) hypertension: Secondary | ICD-10-CM

## 2019-11-27 DIAGNOSIS — N1831 Chronic kidney disease, stage 3a: Secondary | ICD-10-CM

## 2019-11-27 LAB — CBC
HCT: 37.1 % — ABNORMAL LOW (ref 39.0–52.0)
Hemoglobin: 12.6 g/dL — ABNORMAL LOW (ref 13.0–17.0)
MCHC: 33.9 g/dL (ref 30.0–36.0)
MCV: 89.7 fl (ref 78.0–100.0)
Platelets: 140 10*3/uL — ABNORMAL LOW (ref 150.0–400.0)
RBC: 4.14 Mil/uL — ABNORMAL LOW (ref 4.22–5.81)
RDW: 13.2 % (ref 11.5–15.5)
WBC: 6.1 10*3/uL (ref 4.0–10.5)

## 2019-11-27 LAB — COMPREHENSIVE METABOLIC PANEL
ALT: 37 U/L (ref 0–53)
AST: 55 U/L — ABNORMAL HIGH (ref 0–37)
Albumin: 4.5 g/dL (ref 3.5–5.2)
Alkaline Phosphatase: 80 U/L (ref 39–117)
BUN: 21 mg/dL (ref 6–23)
CO2: 27 mEq/L (ref 19–32)
Calcium: 9.5 mg/dL (ref 8.4–10.5)
Chloride: 106 mEq/L (ref 96–112)
Creatinine, Ser: 1.82 mg/dL — ABNORMAL HIGH (ref 0.40–1.50)
GFR: 44.76 mL/min — ABNORMAL LOW (ref >60.00)
Glucose, Bld: 161 mg/dL — ABNORMAL HIGH (ref 70–99)
Potassium: 4.5 mEq/L (ref 3.5–5.1)
Sodium: 140 mEq/L (ref 135–145)
Total Bilirubin: 0.7 mg/dL (ref 0.2–1.2)
Total Protein: 7 g/dL (ref 6.0–8.3)

## 2019-11-27 MED ORDER — AMLODIPINE BESYLATE 10 MG PO TABS: 10.0000 mg | ORAL_TABLET | Freq: Every day | ORAL | 1 refills | Status: DC

## 2019-11-27 NOTE — Progress Notes (Signed)
   Subjective:   Patient ID: Michael Snyder, male    DOB: 1950-10-31, 69 y.o.   MRN: [id number]  HPI The patient is a 69 YO man coming in for BP problems. Recently saw his endocrinologist and his BP was high. He was recommended to see [place] and address. For some reason a different dose of his HCTZ was sent in by this office in January (HCTZ 12.5 mg daily when he takes 25 mg daily). He is unsure what he is taking and the mg. Denies missing doses of medication to his knowledge. Denies chest pains or headaches. Is active outside of work and denies chest pressure with exertion. Denies change in diet but does eat out often. Does not add salt to things.   Review of Systems  Constitutional: Negative.   HENT: Negative.   Eyes: Negative.   Respiratory: Negative for cough, chest tightness and shortness of breath.   Cardiovascular: Negative for chest pain, palpitations and leg swelling.  Gastrointestinal: Negative for abdominal distention, abdominal pain, constipation, diarrhea, nausea and vomiting.  Musculoskeletal: Negative.   Skin: Negative.   Neurological: Negative.   Psychiatric/Behavioral: Negative.     Objective:  Physical Exam Constitutional:      Appearance: He is well-developed.  HENT:     Head: Normocephalic and atraumatic.  Cardiovascular:     Rate and Rhythm: Normal rate and regular rhythm.  Pulmonary:     Effort: Pulmonary effort is normal. No respiratory distress.     Breath sounds: Normal breath sounds. No wheezing or rales.  Abdominal:     General: Bowel sounds are normal. There is no distension.     Palpations: Abdomen is soft.     Tenderness: There is no abdominal tenderness. There is no rebound.  Musculoskeletal:     Cervical back: Normal range of motion.  Skin:    General: Skin is warm and dry.  Neurological:     Mental Status: He is alert and oriented to person, place, and time.     Coordination: Coordination normal.     Vitals:   11/27/19 0840 11/27/19 1035  BP:  (!) 158/102 (!) 140/100  Pulse: 80   Temp: 98 F (36.7 C)   SpO2: 99%   Weight: 178 lb (80.7 kg)   Height: 6' (1.829 m)     This visit occurred during the SARS-CoV-2 public health emergency.  Safety protocols were in place, including screening questions prior to the visit, additional usage of staff PPE, and extensive cleaning of exam room while observing appropriate contact time as indicated for disinfecting solutions.   Assessment & Plan:

## 2019-11-27 NOTE — Assessment & Plan Note (Signed)
With moderate exacerbation today. Will have him verify dosing of hctz at home. Should be taking 25 mg daily but for some reason a 12.5 mg dose was sent in January for him. He is not sure if he is taking this. If he is not on correct dosing will ask him to resume correct dosing and keep amlodipine 5 mg daily as he was previously controlled on this regimen. If he is on 25 mg daily hctz will increase amlodipine to 10 mg daily which was sent in today. Return 1-2 months for BP check. Checking CMP today and CBC.

## 2019-11-27 NOTE — Patient Instructions (Signed)
We will have you check the bottles at home for medicines. Check the one that says HCTZ (hydrochlorothiazide) for the dose (mg). You should be taking 25 mg daily. If you instead have 12.5 mg call us as this is not the right dose of the medicine.   If you are taking the 25 mg HCTZ we have sent in a higher dose of amlodipine 10 mg daily (you are currently taking 5 mg daily). You can take 2 pills amlodipine daily until you run out and then get the new dose from the pharmacy.

## 2019-11-28 ENCOUNTER — Telehealth: Payer: Self-pay

## 2019-11-28 ENCOUNTER — Other Ambulatory Visit: Payer: Self-pay

## 2019-11-28 MED ORDER — RYBELSUS 14 MG PO TABS: 14.0000 mg | ORAL_TABLET | Freq: Every day | ORAL | 3 refills | Status: DC

## 2019-11-28 NOTE — Telephone Encounter (Signed)
revived a PA request from [employer] [place] was sent to cover my meds.  Following message was given.  "This medication or product is on your plan's list of covered drugs. Prior authorization is not required at this time. If your pharmacy has questions regarding the processing of your prescription, please have them call the [facility] pharmacy help desk at [phone]. **Please note: Formulary lowering, tiering exception, cost reduction and/or pre-benefit determination review (including prospective Medicare hospice reviews) requests cannot be requested using this method of submission. Please contact us at [phone] instead."  PA not needed.

## 2019-11-29 ENCOUNTER — Other Ambulatory Visit: Payer: Self-pay

## 2019-11-29 ENCOUNTER — Ambulatory Visit: Payer: Medicare Other | Admitting: Endocrinology

## 2019-11-29 ENCOUNTER — Telehealth: Payer: Self-pay | Admitting: Endocrinology

## 2019-11-29 DIAGNOSIS — Z794 Long term (current) use of insulin: Secondary | ICD-10-CM

## 2019-11-29 MED ORDER — GLIPIZIDE 5 MG PO TABS: 2.5000 mg | ORAL_TABLET | Freq: Every day | ORAL | 3 refills | Status: DC

## 2019-11-29 NOTE — Telephone Encounter (Signed)
Called pt and made him aware of Dr. George Hugh orders below. Verbalized acceptance and understanding. Medication list updated to reflect new orders.

## 2019-11-29 NOTE — Telephone Encounter (Signed)
Please refer below 

## 2019-11-29 NOTE — Telephone Encounter (Signed)
Please reduce the glipizide to 1/2 pill each morning, and: Please continue the same other medications

## 2019-11-29 NOTE — Telephone Encounter (Signed)
Patient called to advise that he had a lowe blood sugar this morning of 64 (non-fasting). Patient advises that he was asked to call office and advise of this information when it occurred.  Call back number is [phone]

## 2020-01-10 DIAGNOSIS — H401211 Low-tension glaucoma, right eye, mild stage: Secondary | ICD-10-CM | POA: Diagnosis not present

## 2020-01-10 DIAGNOSIS — Z961 Presence of intraocular lens: Secondary | ICD-10-CM | POA: Diagnosis not present

## 2020-01-10 DIAGNOSIS — H401222 Low-tension glaucoma, left eye, moderate stage: Secondary | ICD-10-CM | POA: Diagnosis not present

## 2020-02-01 ENCOUNTER — Encounter: Payer: Self-pay | Admitting: Endocrinology

## 2020-02-01 ENCOUNTER — Other Ambulatory Visit: Payer: Self-pay

## 2020-02-01 ENCOUNTER — Ambulatory Visit (INDEPENDENT_AMBULATORY_CARE_PROVIDER_SITE_OTHER): Payer: Medicare Other | Admitting: Endocrinology

## 2020-02-01 VITALS — BP 122/80 | HR 85 | Ht 72.0 in | Wt 175.8 lb

## 2020-02-01 DIAGNOSIS — Z794 Long term (current) use of insulin: Secondary | ICD-10-CM | POA: Diagnosis not present

## 2020-02-01 DIAGNOSIS — E119 Type 2 diabetes mellitus without complications: Secondary | ICD-10-CM | POA: Diagnosis not present

## 2020-02-01 LAB — POCT GLYCOSYLATED HEMOGLOBIN (HGB A1C): Hemoglobin A1C: 8.3 % — AB (ref 4.0–5.6)

## 2020-02-01 NOTE — Patient Instructions (Addendum)
Blood tests are requested for you today.  We'll let you know about the results.   check your blood sugar twice a day.  vary the time of day when you check, between before the 3 meals, and at bedtime.  also check if you have symptoms of your blood sugar being too high or too low.  please keep a record of the readings and bring it to your next appointment here (or you can bring the meter itself).  You can write it on any piece of paper.  please call us sooner if your blood sugar goes below 70, or if you have a lot of readings over 200.   Please come back for a follow-up appointment in 2 months.   

## 2020-02-01 NOTE — Progress Notes (Signed)
Subjective:    Patient ID: Michael Snyder, male    DOB: 10/27/1950, 69 y.o.   MRN: [id number]  HPI Pt returns for f/u of diabetes mellitus:  DM type: Insulin-requiring type 2.   Dx'ed: 1443 Complications: stage 3 CRI.   Therapy: insulin since 2021, and 3 oral meds DKA: never Severe hypoglycemia: never Pancreatitis: never Pancreatic imaging: CT (2020): Fatty atrophy. Coarse calcification of the pancreatic duct and dorsal duct, and dilatation of the main pancreatic duct.    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   Interval history: He brings his meter with his cbg's which I have reviewed today.  cbg varies from 59-161.  He takes just 6 units/d of Lantus.   Past Medical History:  Diagnosis Date  . Alcohol abuse   . Arthritis   . Diabetes mellitus, type 2 ([place])   . Head injury, closed, without LOC 1980s  . History of alcohol abuse   . Hypertension   . 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 Pacemaker    [place]  . Seizure disorder ([place])   . Seizures ([place])    last time in 1990's    Past Surgical History:  Procedure Laterality Date  . LACERATION REPAIR  1980   scalp  . PACEMAKER LEAD REMOVAL N/A 03/23/2014   Procedure: PACEMAKER LEAD REMOVAL;  Surgeon: Evans Lance, MD;  Location: [facility];  Service: Cardiovascular;  Laterality: N/A;  . PTDVP     7/08 medtronic DDD    Social History   Socioeconomic History  . Marital status: Legally Separated    Spouse name: Not on file  . Number of children: 3  . Years of education: Not on file  . Highest education level: Not on file  Occupational History  . Occupation: Games developer, truck Geophysicist/field seismologist  Tobacco Use  . Smoking status: Never Smoker  . Smokeless tobacco: Never Used  Vaping Use  . Vaping Use: Never used  Substance and Sexual Activity  . Alcohol use: No    Comment: quit 20 yrs ago  . Drug use: No  . Sexual  activity: Yes    Partners: Female  Other Topics Concern  . Not on file  Social History Narrative   12th grade education. H/O substance abuse- now abstemious and works with other in recovery..Attends NA on a regular basis and serves as a sponsor. Married 18 years-divorced. Married 5 years 2nd marriage.3 daughters previous marriage, 9 grandchildren, 3 great-grands. 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 Difficulty of Paying Living Expenses:   Food Insecurity:   . Worried About Charity fundraiser in the Last Year:   . Arboriculturist in the Last Year:   Transportation Needs:   . Film/video editor (Medical):   [name] Lack of Transportation (Non-Medical):   Physical Activity: Sufficiently Active  . Days of Exercise per Week: 5 days  . Minutes of Exercise per Session: 80 min  Stress:   . Feeling of Stress :   Social Connections: Unknown  . Frequency of Communication with Friends and Family: Not on file  . Frequency of Social Gatherings with Friends and Family: Not on file  . Attends Religious Services: Not on file  . Active Member  of Clubs or Organizations: Not on file  . Attends Archivist Meetings: Not on file  . Marital Status: Separated  Intimate Partner Violence: Unknown  . Fear of Current or Ex-Partner: Not asked  . Emotionally Abused: Not asked  . Physically Abused: Not asked  . Sexually Abused: Not asked    Current Outpatient Medications on File Prior to Visit  Medication Sig Dispense Refill  . ALPHAGAN P 0.1 % SOLN Place 2 drops into the left eye daily.    [name] amLODipine (NORVASC) 10 MG tablet Take 1 tablet (10 mg total) by mouth daily. 90 tablet 1  . aspirin EC 81 MG tablet Take 81 mg daily by mouth.    [name] atorvastatin (LIPITOR) 20 MG tablet Take 1 tablet (20 mg total) by mouth daily. 90 tablet 3  . Blood Pressure Monitoring (BLOOD PRESSURE KIT) DEVI 1 kit by Does not apply route daily. 1 each 0   . Calcium Carbonate-Vitamin D (CALTRATE 600+D) 600-400 MG-UNIT per tablet Take 1 tablet by mouth 2 (two) times daily. 180 tablet 3  . gabapentin (NEURONTIN) 300 MG capsule Take 300 mg by mouth every 6 (six) hours as needed (pain).     [name] glipiZIDE (GLUCOTROL) 5 MG tablet Take 0.5 tablets (2.5 mg total) by mouth daily before breakfast. 90 tablet 3  . hydrochlorothiazide (HYDRODIURIL) 25 MG tablet Take 1 tablet (25 mg total) by mouth daily. 90 tablet 3  . ibuprofen (ADVIL) 600 MG tablet Take 1 tablet (600 mg total) by mouth every 6 (six) hours as needed. 30 tablet 0  . insulin glargine (LANTUS SOLOSTAR) 100 UNIT/ML Solostar Pen Inject 10 Units into the skin daily. And pen needles 1/day 5 pen PRN  . Lancets (ONETOUCH DELICA PLUS [id number]) MISC CHECK BLOOD SUGAR TWO TIMES DAILY 200 each 3  . Multiple Vitamin (MULTIVITAMIN WITH MINERALS) TABS tablet Take 1 tablet by mouth daily.    . Omega-3 Fatty Acids (FISH OIL) 300 MG CAPS Take 1 capsule (300 mg total) by mouth daily. 90 capsule 3  . ONETOUCH VERIO test strip TEST TWICE DAILY 200 strip 3  . pioglitazone (ACTOS) 30 MG tablet TAKE 1 TABLET BY MOUTH  DAILY DUE FOR ANNUAL VISIT  WITH LABS 90 tablet 3  . potassium chloride SA (KLOR-CON) 20 MEQ tablet TAKE 1 TABLET BY MOUTH  TWICE DAILY 180 tablet 3  . Semaglutide (RYBELSUS) 14 MG TABS Take 14 mg by mouth daily. 90 tablet 3  . triamcinolone ointment (KENALOG) 0.5 % APPLY TOPICALLY TWICE DAILY 90 g 0   No current facility-administered medications on file prior to visit.    Allergies  Allergen Reactions  . Clindamycin/Lincomycin Swelling    Possible facial and lip swelling  . Ace Inhibitors     Facial and lip swelling possibly associated    Family History  Problem Relation Age of Onset  . Hypertension Mother   . COPD Mother   . Hypertension Brother   . Cancer Brother        leukemia  . Hypertension Brother   . Diabetes Neg Hx     BP 122/80   Pulse 85   Ht 6' (1.829 m)   Wt 175 lb 12.8  oz (79.7 kg)   SpO2 98%   BMI 23.84 kg/m    Review of Systems Denies LOC    Objective:   Physical Exam VITAL SIGNS:  See vs page GENERAL: no distress Pulses: dorsalis pedis intact bilat.   MSK: no deformity of the  feet CV: trace bilat leg edema Skin:  no ulcer on the feet.  normal color and temp on the feet. Neuro: sensation is intact to touch on the feet Ext: there is bilateral onychomycosis of the toenails.   Lab Results  Component Value Date   HGBA1C 8.3 (A) 02/01/2020   Lab Results  Component Value Date   CREATININE 1.82 (H) 11/27/2019   BUN 21 11/27/2019   NA 140 11/27/2019   K 4.5 11/27/2019   CL 106 11/27/2019   CO2 27 11/27/2019       Assessment & Plan:  Insulin-requiring type 2 DM.  Uncertain glycemic control Hypoglycemia, due to insulin: this limits aggressiveness of glycemic control Edema, due to pioglitazone: we discussed.  Pt declines to d/c, so we'll continue for now  Patient Instructions  Blood tests are requested for you today.  We'll let you know about the results.   check your blood sugar twice a day.  vary the time of day when you check, between before the 3 meals, and at bedtime.  also check if you have symptoms of your blood sugar being too high or too low.  please keep a record of the readings and bring it to your next appointment here (or you can bring the meter itself).  You can write it on any piece of paper.  please call us sooner if your blood sugar goes below 70, or if you have a lot of readings over 200. Please come back for a follow-up appointment in 2 months.

## 2020-02-02 ENCOUNTER — Ambulatory Visit (INDEPENDENT_AMBULATORY_CARE_PROVIDER_SITE_OTHER): Payer: Medicare Other | Admitting: *Deleted

## 2020-02-02 DIAGNOSIS — I495 Sick sinus syndrome: Secondary | ICD-10-CM | POA: Diagnosis not present

## 2020-02-05 DIAGNOSIS — I129 Hypertensive chronic kidney disease with stage 1 through stage 4 chronic kidney disease, or unspecified chronic kidney disease: Secondary | ICD-10-CM | POA: Diagnosis not present

## 2020-02-05 DIAGNOSIS — N281 Cyst of kidney, acquired: Secondary | ICD-10-CM | POA: Diagnosis not present

## 2020-02-05 DIAGNOSIS — E876 Hypokalemia: Secondary | ICD-10-CM | POA: Diagnosis not present

## 2020-02-05 DIAGNOSIS — E1122 Type 2 diabetes mellitus with diabetic chronic kidney disease: Secondary | ICD-10-CM | POA: Diagnosis not present

## 2020-02-05 DIAGNOSIS — N1831 Chronic kidney disease, stage 3a: Secondary | ICD-10-CM | POA: Diagnosis not present

## 2020-02-06 LAB — CUP PACEART REMOTE DEVICE CHECK
Battery Impedance: 304 Ohm
Battery Remaining Longevity: 120 mo
Battery Voltage: 2.79 V
Brady Statistic AP VP Percent: 0 %
Brady Statistic AP VS Percent: 7 %
Brady Statistic AS VP Percent: 1 %
Brady Statistic AS VS Percent: 93 %
Date Time Interrogation Session: 20210730193421
Implantable Lead Implant Date: 20080711
Implantable Lead Implant Date: 20150918
Implantable Lead Location: 753859
Implantable Lead Location: 753860
Implantable Lead Model: 5076
Implantable Lead Model: 5076
Implantable Pulse Generator Implant Date: 20150918
Lead Channel Impedance Value: 495 Ohm
Lead Channel Impedance Value: 523 Ohm
Lead Channel Pacing Threshold Amplitude: 0.75 V
Lead Channel Pacing Threshold Amplitude: 0.875 V
Lead Channel Pacing Threshold Pulse Width: 0.4 ms
Lead Channel Pacing Threshold Pulse Width: 0.4 ms
Lead Channel Setting Pacing Amplitude: 2 V
Lead Channel Setting Pacing Amplitude: 2.5 V
Lead Channel Setting Pacing Pulse Width: 0.4 ms
Lead Channel Setting Sensing Sensitivity: 2 mV

## 2020-02-06 LAB — FRUCTOSAMINE: Fructosamine: 442 umol/L — ABNORMAL HIGH (ref 205–285)

## 2020-02-07 ENCOUNTER — Other Ambulatory Visit: Payer: Self-pay | Admitting: Nephrology

## 2020-02-07 DIAGNOSIS — N1831 Chronic kidney disease, stage 3a: Secondary | ICD-10-CM

## 2020-02-09 NOTE — Progress Notes (Signed)
Remote pacemaker transmission.   

## 2020-02-13 ENCOUNTER — Ambulatory Visit
Admission: RE | Admit: 2020-02-13 | Discharge: 2020-02-13 | Disposition: A | Discharge status: Home / Self Care | Payer: Medicare Other | Source: Ambulatory Visit | Attending: Nephrology | Admitting: Nephrology

## 2020-02-13 DIAGNOSIS — N200 Calculus of kidney: Secondary | ICD-10-CM | POA: Diagnosis not present

## 2020-02-13 DIAGNOSIS — N281 Cyst of kidney, acquired: Secondary | ICD-10-CM | POA: Diagnosis not present

## 2020-02-13 DIAGNOSIS — N183 Chronic kidney disease, stage 3 unspecified: Secondary | ICD-10-CM | POA: Diagnosis not present

## 2020-02-13 DIAGNOSIS — N1831 Chronic kidney disease, stage 3a: Secondary | ICD-10-CM

## 2020-02-15 ENCOUNTER — Telehealth: Payer: Self-pay

## 2020-02-15 DIAGNOSIS — I472 Ventricular tachycardia, unspecified: Secondary | ICD-10-CM

## 2020-02-15 NOTE — Telephone Encounter (Signed)
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  Dr Graciela Husbands would like for pt to have echocardiogram.  Pt verbalized understanding and is agreeable to scheduling. Order placed and message sent to Thea Alken and precert.

## 2020-02-15 NOTE — Telephone Encounter (Signed)
-----   Message from Duke Salvia, MD sent at 02/12/2020  5:56 PM EDT ----- Remote reviewed. This remote is abnormal for reccurrent Nonsustained VT  It has been years since we looked at echo Can we please arrange Thanks SK

## 2020-02-20 ENCOUNTER — Ambulatory Visit (INDEPENDENT_AMBULATORY_CARE_PROVIDER_SITE_OTHER): Payer: Medicare Other

## 2020-02-20 VITALS — BP 120/80 | HR 77 | Temp 98.4°F | Resp 16 | Ht 72.0 in | Wt 171.8 lb

## 2020-02-20 DIAGNOSIS — Z0000 Encounter for general adult medical examination without abnormal findings: Secondary | ICD-10-CM

## 2020-02-20 NOTE — Patient Instructions (Signed)
Michael Snyder , Thank you for taking time to come for your Medicare Wellness Visit. I appreciate your ongoing commitment to your health goals. Please review the following plan we discussed and let me know if I can assist you in the future.   Screening recommendations/referrals: Colonoscopy: 12/15/2017; due every 5 years; due 2024 Recommended yearly ophthalmology/optometry visit for glaucoma screening and checkup Recommended yearly dental visit for hygiene and checkup  Vaccinations: Influenza vaccine: 04/20/2019 Pneumococcal vaccine: completed on 11/01/2014 and 12/30/2017 Tdap vaccine: 05/02/2018; due every 10 years Shingles vaccine: 12/30/2017; need documentation of second dose   Covid-19:completed  Advanced directives: Advance directive discussed with you today. Even though you declined this today please call our office should you change your mind and we can give you the proper paperwork for you to fill out.  Conditions/risks identified: Yes; Please continue to do your personal lifestyle choices by: daily care of teeth and gums, regular physical activity (goal should be 5 days a week for 30 minutes), eat a healthy diet, avoid tobacco and drug use, limiting any alcohol intake, taking a low-dose aspirin (if not allergic or have been advised by your provider otherwise) and taking vitamins and minerals as recommended by your provider. Continue doing brain stimulating activities (puzzles, reading, adult coloring books, staying active) to keep memory sharp. Continue to eat heart healthy diet (full of fruits, vegetables, whole grains, lean protein, water--limit salt, fat, and sugar intake) and increase physical activity as tolerated.  Next appointment: Please schedule your next Medicare Wellness Visit with your Nurse Health Advisor in 1 year.  Preventive Care 44 Years and Older, Male Preventive care refers to lifestyle choices and visits with your health care provider that can promote health and  wellness. What does preventive care include?  A yearly physical exam. This is also called an annual well check.  Dental exams once or twice a year.  Routine eye exams. Ask your health care provider how often you should have your eyes checked.  Personal lifestyle choices, including:  Daily care of your teeth and gums.  Regular physical activity.  Eating a healthy diet.  Avoiding tobacco and drug use.  Limiting alcohol use.  Practicing safe sex.  Taking low doses of aspirin every day.  Taking vitamin and mineral supplements as recommended by your health care provider. What happens during an annual well check? The services and screenings done by your health care provider during your annual well check will depend on your age, overall health, lifestyle risk factors, and family history of disease. Counseling  Your health care provider may ask you questions about your:  Alcohol use.  Tobacco use.  Drug use.  Emotional well-being.  Home and relationship well-being.  Sexual activity.  Eating habits.  History of falls.  Memory and ability to understand (cognition).  Work and work Astronomer. Screening  You may have the following tests or measurements:  Height, weight, and BMI.  Blood pressure.  Lipid and cholesterol levels. These may be checked every 5 years, or more frequently if you are over 34 years old.  Skin check.  Lung cancer screening. You may have this screening every year starting at age 35 if you have a 30-pack-year history of smoking and currently smoke or have quit within the past 15 years.  Fecal occult blood test (FOBT) of the stool. You may have this test every year starting at age 45.  Flexible sigmoidoscopy or colonoscopy. You may have a sigmoidoscopy every 5 years or a colonoscopy every 10  years starting at age 56.  Prostate cancer screening. Recommendations will vary depending on your family history and other risks.  Hepatitis C blood  test.  Hepatitis B blood test.  Sexually transmitted disease (STD) testing.  Diabetes screening. This is done by checking your blood sugar (glucose) after you have not eaten for a while (fasting). You may have this done every 1-3 years.  Abdominal aortic aneurysm (AAA) screening. You may need this if you are a current or former smoker.  Osteoporosis. You may be screened starting at age 15 if you are at high risk. Talk with your health care provider about your test results, treatment options, and if necessary, the need for more tests. Vaccines  Your health care provider may recommend certain vaccines, such as:  Influenza vaccine. This is recommended every year.  Tetanus, diphtheria, and acellular pertussis (Tdap, Td) vaccine. You may need a Td booster every 10 years.  Zoster vaccine. You may need this after age 30.  Pneumococcal 13-valent conjugate (PCV13) vaccine. One dose is recommended after age 80.  Pneumococcal polysaccharide (PPSV23) vaccine. One dose is recommended after age 80. Talk to your health care provider about which screenings and vaccines you need and how often you need them. This information is not intended to replace advice given to you by your health care provider. Make sure you discuss any questions you have with your health care provider. Document Released: 07/19/2015 Document Revised: 03/11/2016 Document Reviewed: 04/23/2015 Elsevier Interactive Patient Education  2017 [employer] Prevention in the Home Falls can cause injuries. They can happen to people of all ages. There are many things you can do to make your home safe and to help prevent falls. What can I do on the outside of my home?  Regularly fix the edges of walkways and driveways and fix any cracks.  Remove anything that might make you trip as you walk through a door, such as a raised step or threshold.  Trim any bushes or trees on the path to your home.  Use bright outdoor  lighting.  Clear any walking paths of anything that might make someone trip, such as rocks or tools.  Regularly check to see if handrails are loose or broken. Make sure that both sides of any steps have handrails.  Any raised decks and porches should have guardrails on the edges.  Have any leaves, snow, or ice cleared regularly.  Use sand or salt on walking paths during winter.  Clean up any spills in your garage right away. This includes oil or grease spills. What can I do in the bathroom?  Use night lights.  Install grab bars by the toilet and in the tub and shower. Do not use towel bars as grab bars.  Use non-skid mats or decals in the tub or shower.  If you need to sit down in the shower, use a plastic, non-slip stool.  Keep the floor dry. Clean up any water that spills on the floor as soon as it happens.  Remove soap buildup in the tub or shower regularly.  Attach bath mats securely with double-sided non-slip rug tape.  Do not have throw rugs and other things on the floor that can make you trip. What can I do in the bedroom?  Use night lights.  Make sure that you have a light by your bed that is easy to reach.  Do not use any sheets or blankets that are too big for your bed. They should not hang  down onto the floor.  Have a firm chair that has side arms. You can use this for support while you get dressed.  Do not have throw rugs and other things on the floor that can make you trip. What can I do in the kitchen?  Clean up any spills right away.  Avoid walking on wet floors.  Keep items that you use a lot in easy-to-reach places.  If you need to reach something above you, use a strong step stool that has a grab bar.  Keep electrical cords out of the way.  Do not use floor polish or wax that makes floors slippery. If you must use wax, use non-skid floor wax.  Do not have throw rugs and other things on the floor that can make you trip. What can I do with my  stairs?  Do not leave any items on the stairs.  Make sure that there are handrails on both sides of the stairs and use them. Fix handrails that are broken or loose. Make sure that handrails are as long as the stairways.  Check any carpeting to make sure that it is firmly attached to the stairs. Fix any carpet that is loose or worn.  Avoid having throw rugs at the top or bottom of the stairs. If you do have throw rugs, attach them to the floor with carpet tape.  Make sure that you have a light switch at the top of the stairs and the bottom of the stairs. If you do not have them, ask someone to add them for you. What else can I do to help prevent falls?  Wear shoes that:  Do not have high heels.  Have rubber bottoms.  Are comfortable and fit you well.  Are closed at the toe. Do not wear sandals.  If you use a stepladder:  Make sure that it is fully opened. Do not climb a closed stepladder.  Make sure that both sides of the stepladder are locked into place.  Ask someone to hold it for you, if possible.  Clearly mark and make sure that you can see:  Any grab bars or handrails.  First and last steps.  Where the edge of each step is.  Use tools that help you move around (mobility aids) if they are needed. These include:  Canes.  Walkers.  Scooters.  Crutches.  Turn on the lights when you go into a dark area. Replace any light bulbs as soon as they burn out.  Set up your furniture so you have a clear path. Avoid moving your furniture around.  If any of your floors are uneven, fix them.  If there are any pets around you, be aware of where they are.  Review your medicines with your doctor. Some medicines can make you feel dizzy. This can increase your chance of falling. Ask your doctor what other things that you can do to help prevent falls. This information is not intended to replace advice given to you by your health care provider. Make sure you discuss any  questions you have with your health care provider. Document Released: 04/18/2009 Document Revised: 11/28/2015 Document Reviewed: 07/27/2014 Elsevier Interactive Patient Education  2017 [employer].

## 2020-02-20 NOTE — Progress Notes (Addendum)
Subjective:   Michael Snyder is a 69 y.o. male who presents for Medicare Annual/Subsequent preventive examination.  Review of Systems    No ROS. Medicare Wellness Visit. Cardiac Risk Factors include: advanced age (>35mn, >>50women);diabetes mellitus;dyslipidemia;family history of premature cardiovascular disease;hypertension;male gender     Objective:    Today's Vitals   02/20/20 0812  BP: 120/80  Pulse: 77  Resp: 16  Temp: 98.4 F (36.9 C)  SpO2: 99%  Weight: 171 lb 12.8 oz (77.9 kg)  Height: 6' (1.829 m)  PainSc: 0-No pain   Body mass index is 23.3 kg/m.  Advanced Directives 02/20/2020 09/28/2019 03/28/2019 02/16/2019 11/29/2018 10/07/2018 09/14/2018  Does Patient Have a Medical Advance Directive? _0  No No  Type of Advance Directive - - - - - - -  [place]? - - - - - - -  Would patient like information on creating a medical advance directive? No - Patient declined No - Patient declined No - Patient declined No - Patient declined - No - Guardian declined -    Current Medications (verified) Outpatient Encounter Medications as of 02/20/2020  Medication Sig   ALPHAGAN P 0.1 % SOLN Place 2 drops into the left eye daily.   amLODipine (NORVASC) 10 MG tablet Take 1 tablet (10 mg total) by mouth daily.   aspirin EC 81 MG tablet Take 81 mg daily by mouth.   atorvastatin (LIPITOR) 20 MG tablet Take 1 tablet (20 mg total) by mouth daily.   Blood Pressure Monitoring (BLOOD PRESSURE KIT) DEVI 1 kit by Does not apply route daily.   Calcium Carbonate-Vitamin D (CALTRATE 600+D) 600-400 MG-UNIT per tablet Take 1 tablet by mouth 2 (two) times daily.   gabapentin (NEURONTIN) 300 MG capsule Take 300 mg by mouth every 6 (six) hours as needed (pain).    glipiZIDE (GLUCOTROL) 5 MG tablet Take 0.5 tablets (2.5 mg total) by mouth daily before breakfast.   hydrochlorothiazide (HYDRODIURIL) 25 MG tablet Take 1 tablet (25 mg total) by mouth daily.    ibuprofen (ADVIL) 600 MG tablet Take 1 tablet (600 mg total) by mouth every 6 (six) hours as needed.   insulin glargine (LANTUS SOLOSTAR) 100 UNIT/ML Solostar Pen Inject 10 Units into the skin daily. And pen needles 1/day   Lancets (ONETOUCH DELICA PLUS [id number] MISC CHECK BLOOD SUGAR TWO TIMES DAILY   Multiple Vitamin (MULTIVITAMIN WITH MINERALS) TABS tablet Take 1 tablet by mouth daily.   Omega-3 Fatty Acids (FISH OIL) 300 MG CAPS Take 1 capsule (300 mg total) by mouth daily.   ONETOUCH VERIO test strip TEST TWICE DAILY   pioglitazone (ACTOS) 30 MG tablet TAKE 1 TABLET BY MOUTH  DAILY DUE FOR ANNUAL VISIT  WITH LABS   potassium chloride SA (KLOR-CON) 20 MEQ tablet TAKE 1 TABLET BY MOUTH  TWICE DAILY   Semaglutide (RYBELSUS) 14 MG TABS Take 14 mg by mouth daily.   triamcinolone ointment (KENALOG) 0.5 % APPLY TOPICALLY TWICE DAILY   No facility-administered encounter medications on file as of 02/20/2020.    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   Pacemaker  Medtronic Adapta [id number]   Seizure disorder ([place])    Seizures ([place])    last time in 1990's   Past Surgical History:  Procedure Laterality Date   LACERATION REPAIR  1980   scalp   PACEMAKER LEAD REMOVAL N/A 03/23/2014   Procedure: PACEMAKER LEAD REMOVAL;  Surgeon: Evans Lance, MD;  [facility];  Service: Cardiovascular;  Laterality: N/A;   PTDVP     7/08 medtronic DDD   Family History  Problem Relation Age of Onset   Hypertension Mother    COPD Mother    Hypertension Brother    Cancer Brother        leukemia   Hypertension Brother    Diabetes Neg Hx    Social History   Socioeconomic History   Marital status: Legally Separated    Spouse  name: Not on file   Number of children: 3   Years of education: Not on file   Highest education level: Not on file  Occupational History   Occupation: Games developer, truck driver  Tobacco Use   Smoking status: Never Smoker   Smokeless tobacco: Never Used  Scientific laboratory technician Use: Never used  Substance and Sexual Activity   Alcohol use: No    Comment: quit 20 yrs ago   Drug use: No   Sexual activity: Yes    Partners: Female  Other Topics Concern   Not on file  Social History Narrative   12th grade education. H/O substance abuse- now abstemious and works with other in recovery..Attends NA on a regular basis and serves as a sponsor. Married 18 years-divorced. Married 5 years 2nd marriage.3 daughters previous marriage, 9 grandchildren, 3 great-grands. Work- Merchant navy officer            Social Determinants of Radio broadcast assistant Strain: Low Risk    Difficulty of Paying Living Expenses: Not hard at all  Food Insecurity: No Food Insecurity   Worried About Charity fundraiser in the Last Year: Never true   Arboriculturist in the Last Year: Never true  Transportation Needs: No Transportation Needs   Film/video editor (Medical): No   Lack of Transportation (Non-Medical): No  Physical Activity: Sufficiently Active   Days of Exercise per Week: 7 days   Minutes of Exercise per Session: 150+ min  Stress: No Stress Concern Present   Feeling of Stress : Not at all  Social Connections: Moderately Integrated   Frequency of Communication with Friends and Family: More than three times a week   Frequency of Social Gatherings with Friends and Family: Once a week   Attends Religious Services: 1 to 4 times per year   Active Member of [employer] or Organizations: Yes   Attends Archivist Meetings: 1 to 4 times per year   Marital Status: Divorced    Tobacco Counseling Counseling given: No   Clinical Intake:  Pre-visit preparation completed: Yes  Pain : No/denies  pain Pain Score: 0-No pain     BMI - recorded: 23.3 Nutritional Status: BMI of 19-24  Normal Nutritional Risks: None Diabetes: Yes CBG done?: No Did pt. bring in CBG monitor from home?: No  How often do you need to have someone help you when you read instructions, pamphlets, or other written materials from your doctor or pharmacy?: 1 - Never What is the last grade level you completed in school?: HSG  Diabetic? Yes; glucose was 118 fasting  Interpreter Needed?: No  Information entered by :: Gregori Abril N. Taryn Shellhammer, LPN   Activities of Daily Living In your present state of health, do you have any difficulty performing the following activities: 02/20/2020  Hearing? N  Vision? N  Difficulty concentrating or making decisions? N  Walking or climbing stairs? N  Dressing or bathing? N  Doing errands, shopping? N  Preparing Food and eating ? N  Using the Toilet? N  In the past six months, have you accidently leaked urine? N  Do you have problems with loss of bowel control? N  Managing your Medications? N  Managing your Finances? N  Housekeeping or managing your Housekeeping? N  Some recent data might be hidden    Patient Care Team: Hoyt Koch, MD as PCP - General (Internal Medicine)  Indicate any recent Medical Services you may have received from other than [facility] providers in the past year (date may be approximate).     Assessment:   This is a routine wellness examination for Valin.  Hearing/Vision screen No exam data present  Dietary issues and exercise activities discussed: Current Exercise Habits: The patient has a physically strenuous job, but has no regular exercise apart from work., Exercise limited by: None identified  Goals      Patient Stated     Get my diabetes in better control by starting to count carbohydrates and monitoring sugar.        Depression Screen PHQ 2/9 Scores 02/20/2020 09/28/2019 02/16/2019 01/12/2019 12/30/2017 04/05/2017 08/21/2015   PHQ - 2 Score 0 0 0 0 0 0 0    Fall Risk Fall Risk  02/20/2020 11/27/2019 09/28/2019 02/16/2019 01/12/2019  Falls in the past year? 0 0 0 0 0  Number falls in past yr: 0 0 - 0 -  Injury with Fall? 0 0 - - -  Risk for fall due to : No Fall Risks - - - -  Follow up Falls evaluation completed;Education provided - - - -    Any stairs in or around the home? No  If so, are there any without handrails? No  Home free of loose throw rugs in walkways, pet beds, electrical cords, etc? Yes  Adequate lighting in your home to reduce risk of falls? Yes   ASSISTIVE DEVICES UTILIZED TO PREVENT FALLS:  Life alert? No  Use of a cane, walker or w/c? No  Grab bars in the bathroom? No  Shower chair or bench in shower? No  Elevated toilet seat or a handicapped toilet? No   TIMED UP AND GO:  Was the test performed? No .  Length of time to ambulate 10 feet: 0 sec.   Gait steady and fast without use of assistive device  Cognitive Function:     6CIT Screen 02/20/2020  What Year? 0 points  What month? 0 points  What time? 0 points  Count back from 20 0 points  Months in reverse 0 points  Repeat phrase 0 points  Total Score 0    Immunizations Immunization History  Administered Date(s) Administered   Fluad Quad(high Dose 65+) 04/20/2019   Influenza Whole 03/30/2008, 04/16/2009, 06/05/2010   Influenza, High Dose Seasonal PF 06/02/2016, 04/05/2017, 03/29/2018   Influenza,inj,Quad PF,6+ Mos 05/16/2013, 03/08/2014, 08/21/2015   Pneumococcal Conjugate-13 11/01/2014   Pneumococcal Polysaccharide-23 09/13/2012, 12/30/2017   Td 03/30/2008   Tdap 05/02/2018   Zoster 07/20/2014   Zoster Recombinat (Shingrix) 12/30/2017    TDAP status: Up to date Flu Vaccine status: Up to date Pneumococcal vaccine status: Up to  date Covid-19 vaccine status: Completed vaccines  Qualifies for Shingles Vaccine? Yes   Zostavax completed Yes   Shingrix Completed?: Yes  Screening Tests Health Maintenance  Topic  Date Due   COVID-19 Vaccine (1) Never done   OPHTHALMOLOGY EXAM  06/07/2018   INFLUENZA VACCINE  02/04/2020   URINE MICROALBUMIN  04/19/2020   HEMOGLOBIN A1C  08/03/2020   FOOT EXAM  01/31/2021   COLONOSCOPY  12/16/2022   TETANUS/TDAP  05/02/2028   Hepatitis C Screening  Completed   PNA vac Low Risk Adult  Completed    Health Maintenance  Health Maintenance Due  Topic Date Due   COVID-19 Vaccine (1) Never done   OPHTHALMOLOGY EXAM  06/07/2018   INFLUENZA VACCINE  02/04/2020   URINE MICROALBUMIN  04/19/2020    Colorectal cancer screening: Completed 12/15/2017. Repeat every 5 years  Lung Cancer Screening: (Low Dose CT Chest recommended if Age 2-80 years, 30 pack-year currently smoking OR have quit w/in 15years.) does not qualify.   Lung Cancer Screening Referral: no  Additional Screening:  Hepatitis C Screening: does qualify; Completed yes  Vision Screening: Recommended annual ophthalmology exams for early detection of glaucoma and other disorders of the eye. Is the patient up to date with their annual eye exam?  Yes  Who is the provider or what is the name of the office in which the patient attends annual eye exams? [facility] If pt is not established with a provider, would they like to be referred to a provider to establish care? No .   Dental Screening: Recommended annual dental exams for proper oral hygiene  Community Resource Referral / Chronic Care Management: CRR required this visit?  No   CCM required this visit?  No      Plan:     I have personally reviewed and noted the following in the patient's chart:   Medical and social history Use of alcohol, tobacco or illicit drugs  Current medications and supplements Functional ability and status Nutritional status Physical activity Advanced directives List of other physicians Hospitalizations, surgeries, and ER visits in previous 12 months Vitals Screenings to include cognitive, depression,  and falls Referrals and appointments  In addition, I have reviewed and discussed with patient certain preventive protocols, quality metrics, and best practice recommendations. A written personalized care plan for preventive services as well as general preventive health recommendations were provided to patient.     Sheral Flow, LPN   6/39/4320   Nurse Notes: n/a  Medical screening examination/treatment/procedure(s) were performed by non-physician practitioner and as supervising physician I was immediately available for consultation/collaboration.  I agree with above. Lew Dawes, MD

## 2020-03-01 ENCOUNTER — Other Ambulatory Visit: Payer: Self-pay

## 2020-03-01 ENCOUNTER — Ambulatory Visit (HOSPITAL_COMMUNITY): Payer: Medicare Other | Attending: Cardiovascular Disease

## 2020-03-01 DIAGNOSIS — I472 Ventricular tachycardia, unspecified: Secondary | ICD-10-CM

## 2020-03-01 LAB — ECHOCARDIOGRAM COMPLETE
Area-P 1/2: 2.45 cm2
S' Lateral: 2.5 cm

## 2020-03-12 ENCOUNTER — Other Ambulatory Visit: Payer: Self-pay | Admitting: Internal Medicine

## 2020-04-05 ENCOUNTER — Other Ambulatory Visit: Payer: Self-pay | Admitting: Internal Medicine

## 2020-04-08 DIAGNOSIS — N281 Cyst of kidney, acquired: Secondary | ICD-10-CM | POA: Diagnosis not present

## 2020-04-08 DIAGNOSIS — N200 Calculus of kidney: Secondary | ICD-10-CM | POA: Diagnosis not present

## 2020-04-11 ENCOUNTER — Other Ambulatory Visit: Payer: Self-pay | Admitting: Internal Medicine

## 2020-04-11 DIAGNOSIS — I10 Essential (primary) hypertension: Secondary | ICD-10-CM

## 2020-04-17 ENCOUNTER — Telehealth: Payer: Self-pay | Admitting: Internal Medicine

## 2020-04-17 NOTE — Telephone Encounter (Signed)
Patient states he received a call from our office to call and speak to Dr. Odessa Fleming nurse.

## 2020-04-18 ENCOUNTER — Other Ambulatory Visit: Payer: Self-pay

## 2020-04-18 ENCOUNTER — Encounter: Payer: Self-pay | Admitting: Endocrinology

## 2020-04-18 ENCOUNTER — Ambulatory Visit: Payer: Medicare Other | Admitting: Endocrinology

## 2020-04-18 VITALS — BP 122/88 | HR 82 | Ht 72.0 in | Wt 171.0 lb

## 2020-04-18 DIAGNOSIS — Z794 Long term (current) use of insulin: Secondary | ICD-10-CM | POA: Diagnosis not present

## 2020-04-18 DIAGNOSIS — E119 Type 2 diabetes mellitus without complications: Secondary | ICD-10-CM | POA: Diagnosis not present

## 2020-04-18 LAB — POCT GLYCOSYLATED HEMOGLOBIN (HGB A1C): Hemoglobin A1C: 8.3 % — AB (ref 4.0–5.6)

## 2020-04-18 NOTE — Progress Notes (Signed)
Subjective:    Patient ID: Michael Snyder, male    DOB: [birth year], 69 y.o.   MRN: [id number]  HPI Pt returns for f/u of diabetes mellitus:  DM type: Insulin-requiring type 2.   Dx'ed: 7902 Complications: stage 3 CRI.   Therapy: insulin since 2021, and 3 oral meds DKA: never Severe hypoglycemia: never Pancreatitis: never Pancreatic imaging: CT (2020): Fatty atrophy. Coarse calcification of the pancreatic duct and dorsal duct, and dilatation of the main pancreatic duct.    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   Interval history: He brings his meter with his cbg's which I have reviewed today.  cbg varies from 90-127.  He takes just 6 units/d of Lantus.   Past Medical History:  Diagnosis Date  . Alcohol abuse   . Arthritis   . Diabetes mellitus, type 2 ([place])   . Head injury, closed, without LOC 1980s  . History of alcohol abuse   . Hypertension   . 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 Pacemaker    [place]  . Seizure disorder ([place])   . Seizures ([place])    last time in 1990's    Past Surgical History:  Procedure Laterality Date  . LACERATION REPAIR  1980   scalp  . PACEMAKER LEAD REMOVAL N/A 03/23/2014   Procedure: PACEMAKER LEAD REMOVAL;  Surgeon: Evans Lance, MD;  Location: [place];  Service: Cardiovascular;  Laterality: N/A;  . PTDVP     7/08 medtronic DDD    Social History   Socioeconomic History  . Marital status: Legally Separated    Spouse name: Not on file  . Number of children: 3  . Years of education: Not on file  . Highest education level: Not on file  Occupational History  . Occupation: Games developer, truck Geophysicist/field seismologist  Tobacco Use  . Smoking status: Never Smoker  . Smokeless tobacco: Never Used  Vaping Use  . Vaping Use: Never used  Substance and Sexual Activity  . Alcohol use: No    Comment: quit 20 yrs ago  . Drug use: No   . Sexual activity: Yes    Partners: Female  Other Topics Concern  . Not on file  Social History Narrative   12th grade education. H/O substance abuse- now abstemious and works with other in recovery..Attends NA on a regular basis and serves as a sponsor. Married 18 years-divorced. Married 5 years 2nd marriage.3 daughters previous marriage, 9 grandchildren, 3 great-grands. Work- short haul truck Research officer, political party Strain: [place]   . Difficulty of Paying Living Expenses: Not hard at all  Food Insecurity: No Food Insecurity  . Worried About Charity fundraiser in the Last Year: Never true  . Ran Out of Food in the Last Year: Never true  Transportation Needs: No Transportation Needs  . Lack of Transportation (Medical): No  . Lack of Transportation (Non-Medical): No  Physical Activity: Sufficiently Active  . Days of Exercise per Week: 7 days  . Minutes of Exercise per Session: 150+ min  Stress: No Stress Concern Present  . Feeling of Stress : Not at all  Social Connections: Moderately Integrated  . Frequency of Communication with Friends and Family: More than three times a week  . Frequency  of Social Gatherings with Friends and Family: Once a week  . Attends Religious Services: 1 to 4 times per year  . Active Member of Clubs or Organizations: Yes  . Attends Archivist Meetings: 1 to 4 times per year  . Marital Status: Divorced  Human resources officer Violence: Not At Risk  . Fear of Current or Ex-Partner: No  . Emotionally Abused: No  . Physically Abused: No  . Sexually Abused: No    Current Outpatient Medications on File Prior to Visit  Medication Sig Dispense Refill  . ALPHAGAN P 0.1 % SOLN Place 2 drops into the left eye daily.    [name] amLODipine (NORVASC) 10 MG tablet Take 1 tablet (10 mg total) by mouth daily. 90 tablet 1  . aspirin EC 81 MG tablet Take 81 mg daily by mouth.    [name] atorvastatin (LIPITOR) 20 MG tablet  Take 1 tablet (20 mg total) by mouth daily. 90 tablet 3  . Blood Pressure Monitoring (BLOOD PRESSURE KIT) DEVI 1 kit by Does not apply route daily. 1 each 0  . Calcium Carbonate-Vitamin D (CALTRATE 600+D) 600-400 MG-UNIT per tablet Take 1 tablet by mouth 2 (two) times daily. 180 tablet 3  . gabapentin (NEURONTIN) 300 MG capsule Take 300 mg by mouth every 6 (six) hours as needed (pain).     [name] glipiZIDE (GLUCOTROL) 5 MG tablet Take 0.5 tablets (2.5 mg total) by mouth daily before breakfast. 90 tablet 3  . hydrochlorothiazide (HYDRODIURIL) 25 MG tablet Take 1 tablet (25 mg total) by mouth daily. 90 tablet 3  . hydrochlorothiazide (MICROZIDE) 12.5 MG capsule Take 1 capsule by mouth once daily 90 capsule 0  . ibuprofen (ADVIL) 600 MG tablet Take 1 tablet (600 mg total) by mouth every 6 (six) hours as needed. 30 tablet 0  . Lancets (ONETOUCH DELICA PLUS [id number]) MISC CHECK BLOOD SUGAR TWICE  DAILY 200 each 3  . Multiple Vitamin (MULTIVITAMIN WITH MINERALS) TABS tablet Take 1 tablet by mouth daily.    . Omega-3 Fatty Acids (FISH OIL) 300 MG CAPS Take 1 capsule (300 mg total) by mouth daily. 90 capsule 3  . ONETOUCH VERIO test strip TEST TWICE DAILY 200 strip 3  . pioglitazone (ACTOS) 30 MG tablet TAKE 1 TABLET BY MOUTH  DAILY (DUE FOR ANNUAL VISIT WITH LABS) 90 tablet 0  . potassium chloride SA (KLOR-CON) 20 MEQ tablet TAKE 1 TABLET BY MOUTH  TWICE DAILY 180 tablet 3  . Semaglutide (RYBELSUS) 14 MG TABS Take 14 mg by mouth daily. 90 tablet 3  . triamcinolone ointment (KENALOG) 0.5 % APPLY TOPICALLY TWICE DAILY 90 g 0   No current facility-administered medications on file prior to visit.    Allergies  Allergen Reactions  . Clindamycin/Lincomycin Swelling    Possible facial and lip swelling  . Ace Inhibitors     Facial and lip swelling possibly associated    Family History  Problem Relation Age of Onset  . Hypertension Mother   . COPD Mother   . Hypertension Brother   . Cancer Brother         leukemia  . Hypertension Brother   . Diabetes Neg Hx     BP 122/88   Pulse 82   Ht 6' (1.829 m)   Wt 171 lb (77.6 kg)   SpO2 99%   BMI 23.19 kg/m    Review of Systems     Objective:   Physical Exam VITAL SIGNS:  See vs page GENERAL: no  distress Pulses: dorsalis pedis intact bilat.   MSK: no deformity of the feet CV: no leg edema Skin:  no ulcer on the feet.  normal color and temp on the feet. Neuro: sensation is intact to touch on the feet.   Ext: there is bilateral onychomycosis of the toenails.   Lab Results  Component Value Date   HGBA1C 8.3 (A) 04/18/2020       Assessment & Plan:  Insulin-requiring type 2 DM, with CRI: uncontrolled.  He requests a trial off insulin.  I agreed  Patient Instructions  Please stop taking the Lantus, and:  Please continue the same other diabetes medications.  check your blood sugar twice a day.  vary the time of day when you check, between before the 3 meals, and at bedtime.  also check if you have symptoms of your blood sugar being too high or too low.  please keep a record of the readings and bring it to your next appointment here (or you can bring the meter itself).  You can write it on any piece of paper.  please call us sooner if your blood sugar goes below 70, or if you have a lot of readings over 200.   Please come back for a follow-up appointment in 6 weeks.

## 2020-04-18 NOTE — Telephone Encounter (Signed)
    Pt is calling back to follow up  

## 2020-04-18 NOTE — Patient Instructions (Addendum)
Please stop taking the Lantus, and:  Please continue the same other diabetes medications.  check your blood sugar twice a day.  vary the time of day when you check, between before the 3 meals, and at bedtime.  also check if you have symptoms of your blood sugar being too high or too low.  please keep a record of the readings and bring it to your next appointment here (or you can bring the meter itself).  You can write it on any piece of paper.  please call us sooner if your blood sugar goes below 70, or if you have a lot of readings over 200.   Please come back for a follow-up appointment in 6 weeks.

## 2020-04-23 NOTE — Telephone Encounter (Signed)
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  Pt states this has not been an issue in years past.  Encouraged pt to contact DOT PE provider as Dr Graciela Husbands states he does not feel he is able to clear someone for driving.  Pt requests that Dr Graciela Husbands please call him to discuss as this is his livelihood.  Pt advised Dr Graciela Husbands is currently out of town but will return next week.  Will forward request to Dr Graciela Husbands.  Pt thanked Charity fundraiser for call.

## 2020-04-23 NOTE — Telephone Encounter (Signed)
Michael Snyder is calling back due to never hearing from [place].

## 2020-04-30 NOTE — Telephone Encounter (Signed)
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

## 2020-05-01 ENCOUNTER — Telehealth: Payer: Self-pay | Admitting: Internal Medicine

## 2020-05-01 NOTE — Telephone Encounter (Signed)
    Pt would like to speak with Dr. Graciela Husbands, when asked, he said he has a question about his health.

## 2020-05-02 NOTE — Telephone Encounter (Signed)
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  Dr Graciela Husbands will be happy to sign off on specific paperwork who is certified to clear pt.  Suggested pt contact provider who completed paperwork for CDL and advise of Dr Odessa Fleming comments.  Pt verbalizes understanding and agrees with current plan.

## 2020-05-02 NOTE — Telephone Encounter (Signed)
This issue has been addressed in a separate encounter re: pt's clearance for DOT PE.  See encounter for complete details.

## 2020-05-03 ENCOUNTER — Ambulatory Visit (INDEPENDENT_AMBULATORY_CARE_PROVIDER_SITE_OTHER): Payer: Medicare Other

## 2020-05-03 DIAGNOSIS — I495 Sick sinus syndrome: Secondary | ICD-10-CM | POA: Diagnosis not present

## 2020-05-06 ENCOUNTER — Other Ambulatory Visit: Payer: Self-pay | Admitting: Internal Medicine

## 2020-05-06 LAB — CUP PACEART REMOTE DEVICE CHECK
Battery Impedance: 329 Ohm
Battery Remaining Longevity: 117 mo
Battery Voltage: 2.79 V
Brady Statistic AP VP Percent: 0 %
Brady Statistic AP VS Percent: 7 %
Brady Statistic AS VP Percent: 1 %
Brady Statistic AS VS Percent: 92 %
Date Time Interrogation Session: 20211029185254
Implantable Lead Implant Date: 20080711
Implantable Lead Implant Date: 20150918
Implantable Lead Location: 753859
Implantable Lead Location: 753860
Implantable Lead Model: 5076
Implantable Lead Model: 5076
Implantable Pulse Generator Implant Date: 20150918
Lead Channel Impedance Value: 465 Ohm
Lead Channel Impedance Value: 574 Ohm
Lead Channel Pacing Threshold Amplitude: 0.875 V
Lead Channel Pacing Threshold Amplitude: 1 V
Lead Channel Pacing Threshold Pulse Width: 0.4 ms
Lead Channel Pacing Threshold Pulse Width: 0.4 ms
Lead Channel Setting Pacing Amplitude: 2 V
Lead Channel Setting Pacing Amplitude: 2.5 V
Lead Channel Setting Pacing Pulse Width: 0.4 ms
Lead Channel Setting Sensing Sensitivity: 2 mV

## 2020-05-07 NOTE — Progress Notes (Signed)
Remote pacemaker transmission.   

## 2020-05-27 DIAGNOSIS — N1831 Chronic kidney disease, stage 3a: Secondary | ICD-10-CM | POA: Diagnosis not present

## 2020-06-03 DIAGNOSIS — E876 Hypokalemia: Secondary | ICD-10-CM | POA: Diagnosis not present

## 2020-06-05 DIAGNOSIS — E876 Hypokalemia: Secondary | ICD-10-CM | POA: Diagnosis not present

## 2020-06-05 DIAGNOSIS — N281 Cyst of kidney, acquired: Secondary | ICD-10-CM | POA: Diagnosis not present

## 2020-06-05 DIAGNOSIS — I129 Hypertensive chronic kidney disease with stage 1 through stage 4 chronic kidney disease, or unspecified chronic kidney disease: Secondary | ICD-10-CM | POA: Diagnosis not present

## 2020-06-05 DIAGNOSIS — N1832 Chronic kidney disease, stage 3b: Secondary | ICD-10-CM | POA: Diagnosis not present

## 2020-06-05 DIAGNOSIS — E1122 Type 2 diabetes mellitus with diabetic chronic kidney disease: Secondary | ICD-10-CM | POA: Diagnosis not present

## 2020-06-07 ENCOUNTER — Telehealth: Payer: Self-pay | Admitting: Internal Medicine

## 2020-06-07 NOTE — Progress Notes (Signed)
  Chronic Care Management   Note  06/07/2020 Name: Michael Snyder MRN: [id number] DOB: [birth year]  Michael Snyder is a 69 y.o. year old male who is a primary care patient of Myrlene Broker, MD. I reached out to [name] by phone today in response to a referral sent by [name]'s PCP, Myrlene Broker, MD.   Michael Snyder was given information about Chronic Care Management services today including:  1. CCM service includes personalized support from designated clinical staff supervised by his physician, including individualized plan of care and coordination with other care providers 2. 24/7 contact phone numbers for assistance for urgent and routine care needs. 3. Service will only be billed when office clinical staff spend 20 minutes or more in a month to coordinate care. 4. Only one practitioner may furnish and bill the service in a calendar month. 5. The patient may stop CCM services at any time (effective at the end of the month) by phone call to the office staff.   Patient agreed to services and verbal consent obtained.   Follow up plan:   Carley Perdue [employer] Scheduler

## 2020-06-11 ENCOUNTER — Encounter: Payer: Self-pay | Admitting: Endocrinology

## 2020-06-11 ENCOUNTER — Other Ambulatory Visit: Payer: Self-pay

## 2020-06-11 ENCOUNTER — Ambulatory Visit: Payer: Medicare Other | Admitting: Endocrinology

## 2020-06-11 VITALS — BP 128/88 | Ht 72.0 in | Wt 172.4 lb

## 2020-06-11 DIAGNOSIS — N183 Chronic kidney disease, stage 3 unspecified: Secondary | ICD-10-CM

## 2020-06-11 DIAGNOSIS — E119 Type 2 diabetes mellitus without complications: Secondary | ICD-10-CM

## 2020-06-11 DIAGNOSIS — E1122 Type 2 diabetes mellitus with diabetic chronic kidney disease: Secondary | ICD-10-CM

## 2020-06-11 LAB — POCT GLYCOSYLATED HEMOGLOBIN (HGB A1C): Hemoglobin A1C: 9.8 % — AB (ref 4.0–5.6)

## 2020-06-11 MED ORDER — LANTUS SOLOSTAR 100 UNIT/ML ~~LOC~~ SOPN
10.0000 [IU] | PEN_INJECTOR | SUBCUTANEOUS | 3 refills | Status: DC
Start: 2020-06-11 — End: 2020-09-23

## 2020-06-11 NOTE — Progress Notes (Signed)
Subjective:    Patient ID: Michael Snyder, male    DOB: 1951-03-11, 69 y.o.   MRN: [id number]  HPI Pt returns for f/u of diabetes mellitus:  DM type: 2  Dx'ed: 5784 Complications: stage 3 CRI.   Therapy: 3 oral meds DKA: never Severe hypoglycemia: never Pancreatitis: never Pancreatic imaging: CT (2020): Fatty atrophy. Coarse calcification of the pancreatic duct and dorsal duct, and dilatation of the main pancreatic duct.    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   Interval history: no cbg record, but states cbg varies from 92-148.  He takes meds as rx'ed.   Past Medical History:  Diagnosis Date  . Alcohol abuse   . Arthritis   . Diabetes mellitus, type 2 ([place])   . Head injury, closed, without LOC 1980s  . History of alcohol abuse   . Hypertension   . 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 Pacemaker    [place]  . Seizure disorder ([place])   . Seizures ([place])    last time in 1990's    Past Surgical History:  Procedure Laterality Date  . LACERATION REPAIR  1980   scalp  . PACEMAKER LEAD REMOVAL N/A 03/23/2014   Procedure: PACEMAKER LEAD REMOVAL;  Surgeon: Evans Lance, MD;  Location: [facility];  Service: Cardiovascular;  Laterality: N/A;  . PTDVP     7/08 medtronic DDD    Social History   Socioeconomic History  . Marital status: Legally Separated    Spouse name: Not on file  . Number of children: 3  . Years of education: Not on file  . Highest education level: Not on file  Occupational History  . Occupation: Games developer, truck Geophysicist/field seismologist  Tobacco Use  . Smoking status: Never Smoker  . Smokeless tobacco: Never Used  Vaping Use  . Vaping Use: Never used  Substance and Sexual Activity  . Alcohol use: No    Comment: quit 20 yrs ago  . Drug use: No  . Sexual activity: Yes    Partners: Female  Other Topics Concern  . Not on file  Social History  Narrative   12th grade education. H/O substance abuse- now abstemious and works with other in recovery..Attends NA on a regular basis and serves as a sponsor. Married 18 years-divorced. Married 5 years 2nd marriage.3 daughters previous marriage, 9 grandchildren, 3 great-grands. Work- short haul truck Research officer, political party Strain: [place]   . Difficulty of Paying Living Expenses: Not hard at all  Food Insecurity: No Food Insecurity  . Worried About Charity fundraiser in the Last Year: Never true  . Ran Out of Food in the Last Year: Never true  Transportation Needs: No Transportation Needs  . Lack of Transportation (Medical): No  . Lack of Transportation (Non-Medical): No  Physical Activity: Sufficiently Active  . Days of Exercise per Week: 7 days  . Minutes of Exercise per Session: 150+ min  Stress: No Stress Concern Present  . Feeling of Stress : Not at all  Social Connections: Moderately Integrated  . Frequency of Communication with Friends and Family: More than three times a week  . Frequency of Social Gatherings with Friends and Family: Once a week  . Attends Religious Services: 1 to  4 times per year  . Active Member of Clubs or Organizations: Yes  . Attends Archivist Meetings: 1 to 4 times per year  . Marital Status: Divorced  Human resources officer Violence: Not At Risk  . Fear of Current or Ex-Partner: No  . Emotionally Abused: No  . Physically Abused: No  . Sexually Abused: No    Current Outpatient Medications on File Prior to Visit  Medication Sig Dispense Refill  . ALPHAGAN P 0.1 % SOLN Place 2 drops into the left eye daily.    [name] aspirin EC 81 MG tablet Take 81 mg daily by mouth.    [name] atorvastatin (LIPITOR) 20 MG tablet Take 1 tablet (20 mg total) by mouth daily. 90 tablet 3  . Blood Pressure Monitoring (BLOOD PRESSURE KIT) DEVI 1 kit by Does not apply route daily. 1 each 0  . Calcium Carbonate-Vitamin D (CALTRATE  600+D) 600-400 MG-UNIT per tablet Take 1 tablet by mouth 2 (two) times daily. 180 tablet 3  . gabapentin (NEURONTIN) 300 MG capsule Take 300 mg by mouth every 6 (six) hours as needed (pain).     . hydrochlorothiazide (HYDRODIURIL) 25 MG tablet Take 1 tablet (25 mg total) by mouth daily. 90 tablet 3  . hydrochlorothiazide (MICROZIDE) 12.5 MG capsule Take 1 capsule by mouth once daily 90 capsule 0  . ibuprofen (ADVIL) 600 MG tablet Take 1 tablet (600 mg total) by mouth every 6 (six) hours as needed. 30 tablet 0  . Lancets (ONETOUCH DELICA PLUS [id number]) MISC CHECK BLOOD SUGAR TWICE  DAILY 200 each 3  . Multiple Vitamin (MULTIVITAMIN WITH MINERALS) TABS tablet Take 1 tablet by mouth daily.    . Omega-3 Fatty Acids (FISH OIL) 300 MG CAPS Take 1 capsule (300 mg total) by mouth daily. 90 capsule 3  . ONETOUCH VERIO test strip TEST TWICE DAILY 200 strip 3  . pioglitazone (ACTOS) 30 MG tablet TAKE 1 TABLET BY MOUTH  DAILY (DUE FOR ANNUAL VISIT WITH LABS) 90 tablet 0  . potassium chloride SA (KLOR-CON) 20 MEQ tablet TAKE 1 TABLET BY MOUTH  TWICE DAILY 180 tablet 3  . Semaglutide (RYBELSUS) 14 MG TABS Take 14 mg by mouth daily. 90 tablet 3  . triamcinolone ointment (KENALOG) 0.5 % APPLY TOPICALLY TWICE DAILY 90 g 0  . amLODipine (NORVASC) 10 MG tablet Take 1 tablet (10 mg total) by mouth daily. (Patient not taking: Reported on 06/11/2020) 90 tablet 1   No current facility-administered medications on file prior to visit.    Allergies  Allergen Reactions  . Clindamycin/Lincomycin Swelling    Possible facial and lip swelling  . Ace Inhibitors     Facial and lip swelling possibly associated    Family History  Problem Relation Age of Onset  . Hypertension Mother   . COPD Mother   . Hypertension Brother   . Cancer Brother        leukemia  . Hypertension Brother   . Diabetes Neg Hx     BP 128/88   Ht 6' (1.829 m)   Wt 172 lb 6.4 oz (78.2 kg)   BMI 23.38 kg/m    Review of Systems He denies  hypoglycemia.     Objective:   Physical Exam VITAL SIGNS:  See vs page GENERAL: no distress Pulses: dorsalis pedis intact bilat.   MSK: no deformity of the feet CV: no leg edema Skin:  no ulcer on the feet.  normal color and temp on the feet.  Neuro: sensation is intact to touch on the feet.   Ext: there is bilateral onychomycosis of the toenails.     A1c=9.8%    Assessment & Plan:  Type 2 DM, with sage 3 CRI: uncontrolled.  We'll need to go back to insulin.  Patient Instructions  I have sent a prescription to your pharmacy, to change glipizide back to lantus.   Please continue the same other diabetes medications.   check your blood sugar twice a day.  vary the time of day when you check, between before the 3 meals, and at bedtime.  also check if you have symptoms of your blood sugar being too high or too low.  please keep a record of the readings and bring it to your next appointment here (or you can bring the meter itself).  You can write it on any piece of paper.  please call us sooner if your blood sugar goes below 70, or if you have a lot of readings over 200.   Please come back for a follow-up appointment in 6 weeks.  Please bring your meter and a strip, so you can check your blood sugar against the lab.

## 2020-06-11 NOTE — Patient Instructions (Addendum)
I have sent a prescription to your pharmacy, to change glipizide back to lantus.   Please continue the same other diabetes medications.   check your blood sugar twice a day.  vary the time of day when you check, between before the 3 meals, and at bedtime.  also check if you have symptoms of your blood sugar being too high or too low.  please keep a record of the readings and bring it to your next appointment here (or you can bring the meter itself).  You can write it on any piece of paper.  please call us sooner if your blood sugar goes below 70, or if you have a lot of readings over 200.   Please come back for a follow-up appointment in 6 weeks.  Please bring your meter and a strip, so you can check your blood sugar against the lab.

## 2020-06-13 ENCOUNTER — Telehealth: Payer: Self-pay | Admitting: Endocrinology

## 2020-06-13 NOTE — Telephone Encounter (Signed)
Outbound call to patient to confirm provider sent Rx of Lantus insulin to patients preferred pharmacy. Patient verbalized understanding.

## 2020-06-13 NOTE — Telephone Encounter (Signed)
Patient called stating Dr Everardo All is supposed to be sending him in a medication to get his A1C down. Patient does not know the name of this medication. Patient needs it to go to [facility] on [address]

## 2020-06-17 ENCOUNTER — Ambulatory Visit (INDEPENDENT_AMBULATORY_CARE_PROVIDER_SITE_OTHER): Payer: Medicare Other | Admitting: Internal Medicine

## 2020-06-17 ENCOUNTER — Encounter: Payer: Self-pay | Admitting: Internal Medicine

## 2020-06-17 ENCOUNTER — Other Ambulatory Visit: Payer: Self-pay

## 2020-06-17 VITALS — BP 142/86 | HR 70 | Temp 98.2°F | Ht 72.0 in | Wt 168.4 lb

## 2020-06-17 DIAGNOSIS — I10 Essential (primary) hypertension: Secondary | ICD-10-CM

## 2020-06-17 DIAGNOSIS — E119 Type 2 diabetes mellitus without complications: Secondary | ICD-10-CM

## 2020-06-17 LAB — COMPREHENSIVE METABOLIC PANEL
ALT: 19 U/L (ref 0–53)
AST: 26 U/L (ref 0–37)
Albumin: 4.2 g/dL (ref 3.5–5.2)
Alkaline Phosphatase: 78 U/L (ref 39–117)
BUN: 16 mg/dL (ref 6–23)
CO2: 26 mEq/L (ref 19–32)
Calcium: 9.4 mg/dL (ref 8.4–10.5)
Chloride: 105 mEq/L (ref 96–112)
Creatinine, Ser: 1.63 mg/dL — ABNORMAL HIGH (ref 0.40–1.50)
GFR: 42.35 mL/min — ABNORMAL LOW (ref >60.00)
Glucose, Bld: 260 mg/dL — ABNORMAL HIGH (ref 70–99)
Potassium: 3.3 mEq/L — ABNORMAL LOW (ref 3.5–5.1)
Sodium: 137 mEq/L (ref 135–145)
Total Bilirubin: 0.7 mg/dL (ref 0.2–1.2)
Total Protein: 6.9 g/dL (ref 6.0–8.3)

## 2020-06-17 LAB — CBC
HCT: 38.4 % — ABNORMAL LOW (ref 39.0–52.0)
Hemoglobin: 12.7 g/dL — ABNORMAL LOW (ref 13.0–17.0)
MCHC: 33.2 g/dL (ref 30.0–36.0)
MCV: 88.2 fl (ref 78.0–100.0)
Platelets: 135 10*3/uL — ABNORMAL LOW (ref 150.0–400.0)
RBC: 4.36 Mil/uL (ref 4.22–5.81)
RDW: 14.3 % (ref 11.5–15.5)
WBC: 6.7 10*3/uL (ref 4.0–10.5)

## 2020-06-17 LAB — MICROALBUMIN / CREATININE URINE RATIO
Creatinine,U: 50.6 mg/dL
Microalb Creat Ratio: 1.7 mg/g (ref 0.0–30.0)
Microalb, Ur: 0.9 mg/dL (ref 0.0–1.9)

## 2020-06-17 NOTE — Patient Instructions (Signed)
We will check the labs today. Stop the glipizide and keep taking lantus and rybelsus.

## 2020-06-17 NOTE — Progress Notes (Signed)
   Subjective:   Patient ID: Michael Snyder, male    DOB: 12/24/1950, 69 y.o.   MRN: [id number]  HPI The patient is a 69 YO man coming in for concerns about medications. Saw urologist back in October and states that they told him to stop taking several medications and talk to [place]. He did stop taking amlodipine and actos at that time. He saw his endocrinologist yesterday and did not mention this to them. They did adjust his medications yesterday and wanted him to stop glipizide and start insulin daily. He did start the insulin and did not stop glipizide. Denies low sugars. He denies headaches or chest pains.   Review of Systems  Constitutional: Negative.   HENT: Negative.   Eyes: Negative.   Respiratory: Negative for cough, chest tightness and shortness of breath.   Cardiovascular: Negative for chest pain, palpitations and leg swelling.  Gastrointestinal: Negative for abdominal distention, abdominal pain, constipation, diarrhea, nausea and vomiting.  Musculoskeletal: Negative.   Skin: Negative.   Neurological: Negative.   Psychiatric/Behavioral: Negative.     Objective:  Physical Exam Constitutional:      Appearance: He is well-developed and well-nourished.  HENT:     Head: Normocephalic and atraumatic.  Eyes:     Extraocular Movements: EOM normal.  Cardiovascular:     Rate and Rhythm: Normal rate and regular rhythm.  Pulmonary:     Effort: Pulmonary effort is normal. No respiratory distress.     Breath sounds: Normal breath sounds. No wheezing or rales.  Abdominal:     General: Bowel sounds are normal. There is no distension.     Palpations: Abdomen is soft.     Tenderness: There is no abdominal tenderness. There is no rebound.  Musculoskeletal:        General: No edema.     Cervical back: Normal range of motion.  Skin:    General: Skin is warm and dry.  Neurological:     Mental Status: He is alert and oriented to person, place, and time.     Coordination: Coordination normal.   Psychiatric:        Mood and Affect: Mood and affect normal.     Vitals:   06/17/20 1516  BP: (!) 142/86  Pulse: 70  Temp: 98.2 F (36.8 C)  TempSrc: Oral  SpO2: 99%  Weight: 168 lb 6.4 oz (76.4 kg)  Height: 6' (1.829 m)    This visit occurred during the SARS-CoV-2 public health emergency.  Safety protocols were in place, including screening questions prior to the visit, additional usage of staff PPE, and extensive cleaning of exam room while observing appropriate contact time as indicated for disinfecting solutions.   Assessment & Plan:  Visit time 20 minutes in face to face communication with patient and coordination of care, additional 10 minutes spent in record review, coordination or care, ordering tests, communicating/referring to other healthcare professionals, documenting in medical records all on the same day of the visit for total time 30 minutes spent on the visit.

## 2020-06-18 ENCOUNTER — Other Ambulatory Visit: Payer: Self-pay | Admitting: Internal Medicine

## 2020-06-18 ENCOUNTER — Telehealth: Payer: Self-pay | Admitting: Internal Medicine

## 2020-06-18 MED ORDER — TRIAMTERENE-HCTZ 37.5-25 MG PO TABS: 1.0000 | ORAL_TABLET | Freq: Every day | ORAL | 3 refills | Status: DC

## 2020-06-18 NOTE — Assessment & Plan Note (Signed)
Updated medications to take actos off list. Advised him that his endocrinologist wanted him to stop glipizide once he started lantus. Continue rybelsus. Advised to make sure to mention all medication changes and medications he is not taking to all providers at every visit as there is not a notification system so all providers do not know instinctively when he has stopped taking medications.

## 2020-06-18 NOTE — Assessment & Plan Note (Addendum)
He does decline change today. Is taking only hctz 12.5 mg daily today and suspect that we will need to go back to 25 mg daily. He has stopped amlodipine without notifying us. I have reviewed urology note from October and see no mention of their advice to him to stop these medications. Will need close follow up as BP is mildly elevated today. If persistently elevated needs dose increase of his medication. Checking CMP and adjust as needed.

## 2020-06-18 NOTE — Telephone Encounter (Signed)
Michael Snyder with [facility] called and was requesting a refill for atorvastatin (LIPITOR) 20 MG tablet It can be sent to [id number] - [place] ([place]), [place] - [address]

## 2020-06-20 NOTE — Telephone Encounter (Signed)
Refill sent on 06/19/20. See meds.

## 2020-06-24 DIAGNOSIS — E876 Hypokalemia: Secondary | ICD-10-CM | POA: Diagnosis not present

## 2020-07-09 ENCOUNTER — Telehealth: Payer: Self-pay | Admitting: Pharmacist

## 2020-07-09 NOTE — Progress Notes (Signed)
Chronic Care Management Pharmacy Assistant   Name: Michael Snyder  MRN: [id number] DOB: June 02, 1951  Reason for Encounter: Initial Questions   PCP : Hoyt Koch, MD  Allergies:   Allergies  Allergen Reactions  . Clindamycin/Lincomycin Swelling    Possible facial and lip swelling  . Ace Inhibitors     Facial and lip swelling possibly associated    Medications: Outpatient Encounter Medications as of 07/09/2020  Medication Sig  . ALPHAGAN P 0.1 % SOLN Place 2 drops into the left eye daily.  [name] aspirin EC 81 MG tablet Take 81 mg daily by mouth.  [name] atorvastatin (LIPITOR) 20 MG tablet Take 1 tablet (20 mg total) by mouth daily.  . Blood Pressure Monitoring (BLOOD PRESSURE KIT) DEVI 1 kit by Does not apply route daily.  . brimonidine (ALPHAGAN) 0.2 % ophthalmic solution SMARTSIG:In Eye(s)  . Calcium Carbonate-Vitamin D (CALTRATE 600+D) 600-400 MG-UNIT per tablet Take 1 tablet by mouth 2 (two) times daily.  [name] gabapentin (NEURONTIN) 300 MG capsule Take 300 mg by mouth every 6 (six) hours as needed (pain).   . insulin glargine (LANTUS SOLOSTAR) 100 UNIT/ML Solostar Pen Inject 10 Units into the skin every morning. And pen needles 1/day  . Lancets (ONETOUCH DELICA PLUS [id number]) MISC CHECK BLOOD SUGAR TWICE  DAILY  . latanoprost (XALATAN) 0.005 % ophthalmic solution SMARTSIG:In Eye(s)  . Multiple Vitamin (MULTIVITAMIN WITH MINERALS) TABS tablet Take 1 tablet by mouth daily.  . Omega-3 Fatty Acids (FISH OIL) 300 MG CAPS Take 1 capsule (300 mg total) by mouth daily.  Glory Rosebush VERIO test strip TEST TWICE DAILY  . potassium chloride SA (KLOR-CON) 20 MEQ tablet TAKE 1 TABLET BY MOUTH  TWICE DAILY  . Semaglutide (RYBELSUS) 14 MG TABS Take 14 mg by mouth daily.  [name] triamcinolone ointment (KENALOG) 0.5 % APPLY TOPICALLY TWICE DAILY  . triamterene-hydrochlorothiazide (MAXZIDE-25) 37.5-25 MG tablet Take 1 tablet by mouth daily.   No facility-administered encounter medications on file as  of 07/09/2020.    Current Diagnosis: Patient Active Problem List   Diagnosis Date Noted  . De Quervain's tenosynovitis, left 03/14/2019  . Left wrist pain 09/23/2018  . Hepatitis C virus infection cured after antiviral drug therapy 12/24/2015  . Cataract, right eye 11/13/2014  . Cataract, left eye 10/18/2014  . Complications, pacemaker cardiac, 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/2014  . Atrial fibrillation ([place]) 11/17/2012  . Cardiac pacemaker MDT 11/26/2011  . Routine health maintenance 06/16/2011  . Essential hypertension 06/01/2008  . Diabetes mellitus type 2, controlled, without complications ([place]) 13/24/4010  . SICK SINUS SYNDROME 03/26/2007  . SEIZURE DISORDER 03/26/2007    Goals Addressed   None     Follow-Up:  Pharmacist Review   Have you seen any other providers since your last visit? The patient states that he has seen his kidney doctor recently  Any changes in your medications or health? The patient states that he has had some medication changes to his insulin and blood pressure medications  Any side effects from any medications? The patient states that he had some side effects from some recent medications that he was takings steroids that shot his sugar up and amlodipine that swollen his face up but no longer on these medications. He has not had any other side effects from the medications he is now taking.  Do you have an symptoms or problems not managed by your medications? The patient states that he has a popping in his right ring finger that he believes is because  he is no longer taking potassium  Any concerns about your health right now? The patient states that he has no concerns about his health  Has your provider asked that you check blood pressure, blood sugar, or follow special diet at home? The patient states that he does check his blood pressure daily usually in the morning. This morning his blood pressure was 120/87. He also states that he checks his blood sugar daily and  this morning it was 200. He states that he took a shot of insulin and rested and it came down to 149. The patient states that he does try to watch what he eats to keep his blood pressure and blood sugar under control. He states that he watches his salt intake, eats no pork, eats lots of green vegetable and drink plenty of water.  Do you get any type of exercise on a regular basis? The patient states that he walks daily practically all day with the type of work that he does.  Can you think of a goal you would like to reach for your health? The patient states that his goal is to come off as much medication as he can  Do you have any problems getting your medications? The patient states that he does not have any problems with getting his medications from the pharmacy  Is there anything that you would like to discuss during the appointment? The patient states that he would like to discuss his medications. He would like to know about the medications that he is taking and why he needs to be taking them.  Please bring medications and supplements to appointment    Wendy Poet, [place]

## 2020-07-10 ENCOUNTER — Ambulatory Visit: Payer: Medicare Other | Admitting: Pharmacist

## 2020-07-10 ENCOUNTER — Other Ambulatory Visit: Payer: Self-pay

## 2020-07-10 DIAGNOSIS — Z794 Long term (current) use of insulin: Secondary | ICD-10-CM

## 2020-07-10 DIAGNOSIS — E785 Hyperlipidemia, unspecified: Secondary | ICD-10-CM

## 2020-07-10 DIAGNOSIS — I10 Essential (primary) hypertension: Secondary | ICD-10-CM

## 2020-07-10 DIAGNOSIS — E1169 Type 2 diabetes mellitus with other specified complication: Secondary | ICD-10-CM

## 2020-07-10 DIAGNOSIS — IMO0002 Reserved for concepts with insufficient information to code with codable children: Secondary | ICD-10-CM

## 2020-07-10 NOTE — Chronic Care Management (AMB) (Signed)
Chronic Care Management Pharmacy  Name: Michael Snyder  MRN: [id number] DOB: 1951-01-19   Chief Complaint/ HPI  Michael Snyder,  70 y.o. , male presents for his Initial CCM visit with the clinical pharmacist via telephone due to COVID-19 Pandemic.  PCP : Hoyt Koch, MD Patient Care Team: Hoyt Koch, MD as PCP - General (Internal Medicine) Charlton Haws, [facility] as Pharmacist (Pharmacist)  Patient's chronic conditions include: Hypertension, Hyperlipidemia, Diabetes, Atrial Fibrillation and Chronic Kidney Disease, hx seizure disorder, sick sinus syndrome, glaucoma  Office Visits: 06/17/20 Dr Sharlet Salina OV: chronic f/u. Pt reports urologist told him to stop amlodipine and actos- per PCP's review of note no evidence of this. Advised to stop glipizide when starting Lantus (per endocrine). K low, switched HCTZ to triamterene/HCTZ and advised to stop potassium.  Consult Visit: 06/11/20 Dr Loanne Drilling (endocrine): Fructosamine higher than A1c. Stop glipizide and start Lantus, continue Rybelsus.   06/05/20 Dr Royce Macadamia (nephrology): advised to stop HCTZ, continue KCl 40 daily  04/18/20 Dr Loanne Drilling (endocrine): pt requests trial off insulin  04/08/20 Dr Jeffie Pollock (urology): renal cysts/stone eval, resolved. No med changes.  02/05/20 Dr Royce Macadamia (nephrology): f/u for CKD  Subjective: Patient lives alone but has daughter and grandchildren nearby. He enjoys fishing with his 61 year old grandson.   Objective: Allergies  Allergen Reactions  . Clindamycin/Lincomycin Swelling    Possible facial and lip swelling  . Ace Inhibitors     Facial and lip swelling possibly associated    Medications: Outpatient Encounter Medications as of 07/10/2020  Medication Sig  . aspirin EC 81 MG tablet Take 81 mg daily by mouth.  [name] atorvastatin (LIPITOR) 20 MG tablet Take 1 tablet (20 mg total) by mouth daily.  . Blood Pressure Monitoring (BLOOD PRESSURE KIT) DEVI 1 kit by Does not apply route daily.  .  brimonidine (ALPHAGAN) 0.2 % ophthalmic solution SMARTSIG:In Eye(s)  . Calcium Carbonate-Vitamin D (CALTRATE 600+D) 600-400 MG-UNIT per tablet Take 1 tablet by mouth 2 (two) times daily.  . Cholecalciferol (VITAMIN D) 50 MCG (2000 UT) tablet Take 2,000 Units by mouth daily.  . insulin glargine (LANTUS SOLOSTAR) 100 UNIT/ML Solostar Pen Inject 10 Units into the skin every morning. And pen needles 1/day  . Lancets (ONETOUCH DELICA PLUS [id number]) MISC CHECK BLOOD SUGAR TWICE  DAILY  . latanoprost (XALATAN) 0.005 % ophthalmic solution SMARTSIG:In Eye(s)  . Multiple Vitamin (MULTIVITAMIN WITH MINERALS) TABS tablet Take 1 tablet by mouth daily.  . Omega-3 Fatty Acids (FISH OIL) 300 MG CAPS Take 1 capsule (300 mg total) by mouth daily.  Glory Rosebush VERIO test strip TEST TWICE DAILY  . pioglitazone (ACTOS) 30 MG tablet Take 30 mg by mouth daily.  . Semaglutide (RYBELSUS) 14 MG TABS Take 14 mg by mouth daily.  [name] triamcinolone ointment (KENALOG) 0.5 % APPLY TOPICALLY TWICE DAILY  . triamterene-hydrochlorothiazide (MAXZIDE-25) 37.5-25 MG tablet Take 1 tablet by mouth daily.  . [DISCONTINUED] ALPHAGAN P 0.1 % SOLN Place 2 drops into the left eye daily. (Patient not taking: Reported on 07/10/2020)  . [DISCONTINUED] gabapentin (NEURONTIN) 300 MG capsule Take 300 mg by mouth every 6 (six) hours as needed (pain).  (Patient not taking: Reported on 07/10/2020)  . [DISCONTINUED] potassium chloride SA (KLOR-CON) 20 MEQ tablet TAKE 1 TABLET BY MOUTH  TWICE DAILY (Patient not taking: Reported on 07/10/2020)   No facility-administered encounter medications on file as of 07/10/2020.    Wt Readings from Last 3 Encounters:  06/17/20 168 lb 6.4 oz (76.4 kg)  06/11/20 172 lb 6.4 oz (78.2 kg)  04/18/20 171 lb (77.6 kg)    Lab Results  Component Value Date   CREATININE 1.63 (H) 06/17/2020   BUN 16 06/17/2020   GFR 42.35 (L) 06/17/2020   GFRNONAA 46 (L) 11/29/2018   GFRAA 54 (L) 11/29/2018   NA 137 06/17/2020   K 3.3 (L)  06/17/2020   CALCIUM 9.4 06/17/2020   CO2 26 06/17/2020     Current Diagnosis/Assessment:    Goals Addressed            This Visit's Progress   . Pharmacy Care Plan       CARE PLAN ENTRY (see longitudinal plan of care for additional care plan information)  Current Barriers:  . Chronic Disease Management support, education, and care coordination needs related to Hypertension, Hyperlipidemia, and Diabetes, Constipation   Hypertension BP Readings from Last 3 Encounters:  06/17/20 (!) 142/86  06/11/20 128/88  04/18/20 122/88 .  Pharmacist Clinical Goal(s): o Over the next 30 days, patient will work with PharmD and providers to maintain BP goal <130/80 . Current regimen:  o Triamterene-HCTZ 37.5-25 mg daily . Interventions: o Discussed BP goals and benefits of medications for prevention of heart attack / stroke o Discussed triamterene is potassium-sparing so additional Kcl supplementation may be unncessary . Patient self care activities - Over the next 30 days, patient will: o Check BP daily, document, and provide at future appointments o Ensure daily salt intake < 2300 mg/day  Hyperlipidemia Lab Results  Component Value Date/Time   LDLCALC 85 05/02/2018 04:41 PM   LDLDIRECT 102.0 04/20/2019 08:55 AM .  Pharmacist Clinical Goal(s): o Over the next 30 days, patient will work with PharmD and providers to achieve LDL goal < 100 . Current regimen:  o Atorvastatin 20 mg daily . Interventions: o Discussed cholesterol goals and benefits of medications for prevention of heart attack / stroke . Patient self care activities - Over the next 30 days, patient will: o Continue current medication  Diabetes Lab Results  Component Value Date/Time   HGBA1C 9.8 (A) 06/11/2020 07:48 AM   HGBA1C 8.3 (A) 04/18/2020 07:46 AM   HGBA1C 10.6 (H) 04/20/2019 08:55 AM   HGBA1C 8.8 (H) 05/02/2018 04:41 PM .  Pharmacist Clinical Goal(s): o Over the next 30 days, patient will work with PharmD  and providers to achieve A1c goal <8% . Current regimen:  o Lantus 10 units in morning o Rybelsus 14 mg daily o Pioglitazone 30 mg daily . Interventions: o Discussed importance of maintaining sugars at goal to prevent complications of diabetes including kidney damage, retinal damage, and cardiovascular disease o Advised to move Lantus to Bedtime and increase Lantus by 2 units every 3 days until fasting BG < 130 . Patient self care activities - Over the next 30 days, patient will: o Check blood sugar once daily and in the morning before eating or drinking, document, and provide at future appointments o Contact provider with any episodes of hypoglycemia  Constipation . Pharmacist Clinical Goal(s) o Over the next 30 days, patient will work with PharmD and providers to improve constipation . Current regimen:  o Magnesium citrate - 1 bottle as needed . Interventions: o Advised to use Miralax daily and docusate 100 mg daily for more gentle control over constipation . Patient self care activities - Over the next 30 days, patient will: o Use OTC remedies as above  Medication management . Pharmacist Clinical Goal(s): o Over the next 30 days, patient will  work with PharmD and providers to maintain optimal medication adherence . Current pharmacy: [facility] mail order . Interventions o Comprehensive medication review performed. o Continue current medication management strategy . Patient self care activities - Over the next 30 days, patient will: o Focus on medication adherence by fill date o Take medications as prescribed o Report any questions or concerns to PharmD and/or provider(s)  Initial goal documentation       AFIB / Hypertension   Hx sick sinus syndrome s/p PPM  Patient is currently rate controlled. Office heart rates are  Pulse Readings from Last 3 Encounters:  06/17/20 70  04/18/20 82  02/20/20 77   CHA2DS2-VASc Score = 3  The patient's score is based upon: CHF  History: No HTN History: Yes Diabetes History: Yes Stroke History: No Vascular Disease History: No Age Score: 1 Gender Score: 0  BP goal is:  <130/80  Office blood pressures are  BP Readings from Last 3 Encounters:  06/17/20 (!) 142/86  06/11/20 128/88  04/18/20 122/88   Patient checks BP at home daily Patient home BP readings are ranging: 120/80s  Patient has failed these meds in past: HCTZ (hypoK), amlodipine (swelling) Patient is currently controlled on the following medications:  . Triamterene-HCTZ 37.5-25 mg daily . Aspirin 81 mg daily  We discussed:  BP goals; recent switch from HCTZ to triamterene due to low potassium, pt confirms he has switched meds as directed; pt was worried about no longer taking KCl supplement, discussed triamterene is potassium-sparing and KCL supplement is not required.  Pt does not endorse palpitations or other issues with Afib. He has not been on anticoagulation in many years and per patient has discussed this with his cardiologist.  Plan  Continue current medications   Diabetes   A1c goal <8%  Recent Relevant Labs: Lab Results  Component Value Date/Time   HGBA1C 9.8 (A) 06/11/2020 07:48 AM   HGBA1C 8.3 (A) 04/18/2020 07:46 AM   HGBA1C 10.6 (H) 04/20/2019 08:55 AM   HGBA1C 8.8 (H) 05/02/2018 04:41 PM   FRUCTOSAMINE 442 (H) 02/01/2020 08:14 AM   FRUCTOSAMINE 466 (H) 09/22/2019 08:45 AM   GFR 42.35 (L) 06/17/2020 03:40 PM   GFR 44.76 (L) 11/27/2019 09:01 AM   MICROALBUR 0.9 06/17/2020 03:40 PM   MICROALBUR 5.0 (H) 04/20/2019 08:55 AM    Last diabetic Eye exam:  Lab Results  Component Value Date/Time   HMDIABEYEEXA No Retinopathy 06/07/2017 12:00 AM    Last diabetic Foot exam: No results found for: HMDIABFOOTEX   Checking BG: Daily  Recent FBG Readings: 184, 194  Patient has failed these meds in past: glipizide Patient is currently uncontrolled on the following medications: . Lantus 10 units qAM - not filled? . Rybelsus  14 mg daily . Pioglitazone 30 mg daily  We discussed: recent medication changes - pt confirms he has stopped glipzide and is taking Lantus; he does skip Lantus if BG is < 100 -he is also still taking pioglitazone, which was recently removed from med list because pt reported he was not taking it; there is no identifiable reason to stop pioglitazone currently, advised pt to continue taking until he can f/u with endocrinologist.  -discussed benefits of HS dosing for Lantus and ability to titrate Lantus every 3 days to achieve fasting BG < 130  Plan  Continue current medications Continue pioglitazone until f/u with endocrine Advised to move Lantus to bedtime and increase by 2 units every 3 days until fasting BG is < 130  Hyperlipidemia   LDL goal < 100  Last lipids Lab Results  Component Value Date   CHOL 187 04/20/2019   HDL 46.20 04/20/2019   LDLCALC 85 05/02/2018   LDLDIRECT 102.0 04/20/2019   TRIG 209.0 (H) 04/20/2019   CHOLHDL 4 04/20/2019   Hepatic Function Latest Ref Rng & Units 06/17/2020 11/27/2019 05/17/2019  Total Protein 6.0 - 8.3 g/dL 6.9 7.0 6.6  Albumin 3.5 - 5.2 g/dL 4.2 4.5 4.2  AST 0 - 37 U/L 26 55(H) 33  ALT 0 - 53 U/L 19 37 33  Alk Phosphatase 39 - 117 U/L 78 80 73  Total Bilirubin 0.2 - 1.2 mg/dL 0.7 0.7 0.9  Bilirubin, Direct 0.01 - 0.4 - - -    The 10-year ASCVD risk score Mikey Bussing DC Jr., et al., 2013) is: 38.8%   Values used to calculate the score:     Age: 28 years     Sex: Male     Is Non-Hispanic African American: Yes     Diabetic: Yes     Tobacco smoker: No     Systolic Blood Pressure: 161 mmHg     Is BP treated: Yes     HDL Cholesterol: 46.2 mg/dL     Total Cholesterol: 187 mg/dL   Patient has failed these meds in past: n/a Patient is currently controlled on the following medications:  . Atorvastatin 20 mg daily . Omega-3 Fish oil 300 mg daily  We discussed:  Cholesterol goals; benefits of statin for ASCVD risk reduction  Plan  Continue  current medications   Glaucoma   Per Dr Crista Curb  Patient has failed these meds in past: n/a Patient is currently controlled on the following medications:  . Brimonidine (Alphagan) 0.2% eye drops . Latanoprost 0.005% eye drops  We discussed:  Patient is satisfied with current regimen and denies issues  Plan  Continue current medications  Constipation    Pt reports worsening constipation over last few days, could not identify cause (no change in diet or lifestyle, medications would not cause significant constipation)  Patient has failed these meds in past: n/a Patient is currently uncontrolled on the following medications:  [name] Magnesium citrate PRN  We discussed:  Pt used 1 bottle of mag citrate 2 days ago which helped but is now constipated again. Discussed benefits of docusate and Miralax daily for more gentle relief of constipation  Plan  Recommend daily Miralax and docusate 100 mg until constipation improves  Health Maintenance   Patient is currently controlled on the following medications:  . Calcium carbonate-Vitamin D 600-500 mg-IU BID . Vitamin D 2000 IU daily . Magnesium . Multivitamin . Triamcinolone cream PRN  We discussed:  Patient is satisfied with current regimen and denies issues; advised to use Tylenol PRN for pain, avoid NSAIDs due to kidney function   Plan  Continue current medications  Medication Management   Patient's preferred pharmacy is:  [facility], [address], [address] [place], [place] [address] Phone: [phone] Fax: [phone]  Uses pill box? Yes Pt endorses 90% compliance - mainly due to confusion  We discussed: Current pharmacy is preferred with insurance plan and patient is satisfied with pharmacy services  Plan  Continue current medication management strategy    Follow up: 1 month phone visit  Charlene Brooke, PharmD, BCACP Clinical Pharmacist [place]  [facility] at [facility] [phone]

## 2020-07-10 NOTE — Patient Instructions (Addendum)
Visit Information  Phone number for Pharmacist: [phone]  Thank you for meeting with me to discuss your medications! I look forward to working with you to achieve your health care goals. Below is a summary of what we talked about during the visit:  Goals Addressed            This Visit's Progress   . Pharmacy Care Plan       CARE PLAN ENTRY (see longitudinal plan of care for additional care plan information)  Current Barriers:  . Chronic Disease Management support, education, and care coordination needs related to Hypertension, Hyperlipidemia, and Diabetes, Constipation   Hypertension BP Readings from Last 3 Encounters:  06/17/20 (!) 142/86  06/11/20 128/88  04/18/20 122/88 .  Pharmacist Clinical Goal(s): o Over the next 30 days, patient will work with PharmD and providers to maintain BP goal <130/80 . Current regimen:  o Triamterene-HCTZ 37.5-25 mg daily . Interventions: o Discussed BP goals and benefits of medications for prevention of heart attack / stroke o Discussed triamterene is potassium-sparing so additional Kcl supplementation may be unncessary . Patient self care activities - Over the next 30 days, patient will: o Check BP daily, document, and provide at future appointments o Ensure daily salt intake < 2300 mg/day  Hyperlipidemia Lab Results  Component Value Date/Time   LDLCALC 85 05/02/2018 04:41 PM   LDLDIRECT 102.0 04/20/2019 08:55 AM .  Pharmacist Clinical Goal(s): o Over the next 30 days, patient will work with PharmD and providers to achieve LDL goal < 100 . Current regimen:  o Atorvastatin 20 mg daily . Interventions: o Discussed cholesterol goals and benefits of medications for prevention of heart attack / stroke . Patient self care activities - Over the next 30 days, patient will: o Continue current medication  Diabetes Lab Results  Component Value Date/Time   HGBA1C 9.8 (A) 06/11/2020 07:48 AM   HGBA1C 8.3 (A) 04/18/2020 07:46 AM   HGBA1C  10.6 (H) 04/20/2019 08:55 AM   HGBA1C 8.8 (H) 05/02/2018 04:41 PM .  Pharmacist Clinical Goal(s): o Over the next 30 days, patient will work with PharmD and providers to achieve A1c goal <8% . Current regimen:  o Lantus 10 units in morning o Rybelsus 14 mg daily o Pioglitazone 30 mg daily . Interventions: o Discussed importance of maintaining sugars at goal to prevent complications of diabetes including kidney damage, retinal damage, and cardiovascular disease o Advised to move Lantus to Bedtime and increase Lantus by 2 units every 3 days until fasting BG < 130 . Patient self care activities - Over the next 30 days, patient will: o Check blood sugar once daily and in the morning before eating or drinking, document, and provide at future appointments o Contact provider with any episodes of hypoglycemia  Constipation . Pharmacist Clinical Goal(s) o Over the next 30 days, patient will work with PharmD and providers to improve constipation . Current regimen:  o Magnesium citrate - 1 bottle as needed . Interventions: o Advised to use Miralax daily and docusate 100 mg daily for more gentle control over constipation . Patient self care activities - Over the next 30 days, patient will: o Use OTC remedies as above  Medication management . Pharmacist Clinical Goal(s): o Over the next 30 days, patient will work with PharmD and providers to maintain optimal medication adherence . Current pharmacy: [facility] mail order . Interventions o Comprehensive medication review performed. o Continue current medication management strategy . Patient self care activities - Over  the next 30 days, patient will: o Focus on medication adherence by fill date o Take medications as prescribed o Report any questions or concerns to PharmD and/or provider(s)  Initial goal documentation      Michael Snyder was given information about Chronic Care Management services today including:  1. CCM service includes  personalized support from designated clinical staff supervised by his physician, including individualized plan of care and coordination with other care providers 2. 24/7 contact phone numbers for assistance for urgent and routine care needs. 3. Standard insurance, coinsurance, copays and deductibles apply for chronic care management only during months in which we provide at least 20 minutes of these services. Most insurances cover these services at 100%, however patients may be responsible for any copay, coinsurance and/or deductible if applicable. This service may help you avoid the need for more expensive face-to-face services. 4. Only one practitioner may furnish and bill the service in a calendar month. 5. The patient may stop CCM services at any time (effective at the end of the month) by phone call to the office staff.  Patient agreed to services and verbal consent obtained.   The patient verbalized understanding of instructions, educational materials, and care plan provided today and agreed to receive a mailed copy of patient instructions, educational materials, and care plan.  Telephone follow up appointment with pharmacy team member scheduled for: 1 month  Al Corpus, PharmD, BCACP Clinical Pharmacist [facility] [phone]  Diabetes Mellitus and Nutrition, Adult When you have diabetes (diabetes mellitus), it is very important to have healthy eating habits because your blood sugar (glucose) levels are greatly affected by what you eat and drink. Eating healthy foods in the appropriate amounts, at about the same times every day, can help you:  Control your blood glucose.  Lower your risk of heart disease.  Improve your blood pressure.  Reach or maintain a healthy weight. Every person with diabetes is different, and each person has different needs for a meal plan. Your health care provider may recommend that you work with a diet and nutrition  specialist (dietitian) to make a meal plan that is best for you. Your meal plan may vary depending on factors such as:  The calories you need.  The medicines you take.  Your weight.  Your blood glucose, blood pressure, and cholesterol levels.  Your activity level.  Other health conditions you have, such as heart or kidney disease. How do carbohydrates affect me? Carbohydrates, also called carbs, affect your blood glucose level more than any other type of food. Eating carbs naturally raises the amount of glucose in your blood. Carb counting is a method for keeping track of how many carbs you eat. Counting carbs is important to keep your blood glucose at a healthy level, especially if you use insulin or take certain oral diabetes medicines. It is important to know how many carbs you can safely have in each meal. This is different for every person. Your dietitian can help you calculate how many carbs you should have at each meal and for each snack. Foods that contain carbs include:  Bread, cereal, rice, pasta, and crackers.  Potatoes and corn.  Peas, beans, and lentils.  Milk and yogurt.  Fruit and juice.  Desserts, such as cakes, cookies, ice cream, and candy. How does alcohol affect me? Alcohol can cause a sudden decrease in blood glucose (hypoglycemia), especially if you use insulin or take certain oral diabetes medicines. Hypoglycemia can be a  life-threatening condition. Symptoms of hypoglycemia (sleepiness, dizziness, and confusion) are similar to symptoms of having too much alcohol. If your health care provider says that alcohol is safe for you, follow these guidelines:  Limit alcohol intake to no more than 1 drink per day for nonpregnant women and 2 drinks per day for men. One drink equals 12 oz of beer, 5 oz of wine, or 1 oz of hard liquor.  Do not drink on an empty stomach.  Keep yourself hydrated with water, diet soda, or unsweetened iced tea.  Keep in mind that regular  soda, juice, and other mixers may contain a lot of sugar and must be counted as carbs. What are tips for following this plan?  Reading food labels  Start by checking the serving size on the "Nutrition Facts" label of packaged foods and drinks. The amount of calories, carbs, fats, and other nutrients listed on the label is based on one serving of the item. Many items contain more than one serving per package.  Check the total grams (g) of carbs in one serving. You can calculate the number of servings of carbs in one serving by dividing the total carbs by 15. For example, if a food has 30 g of total carbs, it would be equal to 2 servings of carbs.  Check the number of grams (g) of saturated and trans fats in one serving. Choose foods that have low or no amount of these fats.  Check the number of milligrams (mg) of salt (sodium) in one serving. Most people should limit total sodium intake to less than 2,300 mg per day.  Always check the nutrition information of foods labeled as "low-fat" or "nonfat". These foods may be higher in added sugar or refined carbs and should be avoided.  Talk to your dietitian to identify your daily goals for nutrients listed on the label. Shopping  Avoid buying canned, premade, or processed foods. These foods tend to be high in fat, sodium, and added sugar.  Shop around the outside edge of the grocery store. This includes fresh fruits and vegetables, bulk grains, fresh meats, and fresh dairy. Cooking  Use low-heat cooking methods, such as baking, instead of high-heat cooking methods like deep frying.  Cook using healthy oils, such as olive, canola, or sunflower oil.  Avoid cooking with butter, cream, or high-fat meats. Meal planning  Eat meals and snacks regularly, preferably at the same times every day. Avoid going long periods of time without eating.  Eat foods high in fiber, such as fresh fruits, vegetables, beans, and whole grains. Talk to your dietitian  about how many servings of carbs you can eat at each meal.  Eat 4-6 ounces (oz) of lean protein each day, such as lean meat, chicken, fish, eggs, or tofu. One oz of lean protein is equal to: ? 1 oz of meat, chicken, or fish. ? 1 egg. ?  cup of tofu.  Eat some foods each day that contain healthy fats, such as avocado, nuts, seeds, and fish. Lifestyle  Check your blood glucose regularly.  Exercise regularly as told by your health care provider. This may include: ? 150 minutes of moderate-intensity or vigorous-intensity exercise each week. This could be brisk walking, biking, or water aerobics. ? Stretching and doing strength exercises, such as yoga or weightlifting, at least 2 times a week.  Take medicines as told by your health care provider.  Do not use any products that contain nicotine or tobacco, such as cigarettes and e-cigarettes.  If you need help quitting, ask your health care provider.  Work with a Veterinary surgeon or diabetes educator to identify strategies to manage stress and any emotional and social challenges. Questions to ask a health care provider  Do I need to meet with a diabetes educator?  Do I need to meet with a dietitian?  What number can I call if I have questions?  When are the best times to check my blood glucose? Where to find more information:  American Diabetes Association: diabetes.org  Academy of Nutrition and Dietetics: www.eatright.[employer] of Diabetes and Digestive and Kidney Diseases (NIH): CarFlippers.tn Summary  A healthy meal plan will help you control your blood glucose and maintain a healthy lifestyle.  Working with a diet and nutrition specialist (dietitian) can help you make a meal plan that is best for you.  Keep in mind that carbohydrates (carbs) and alcohol have immediate effects on your blood glucose levels. It is important to count carbs and to use alcohol carefully. This information is not intended to replace advice  given to you by your health care provider. Make sure you discuss any questions you have with your health care provider. Document Revised: 06/04/2017 Document Reviewed: 07/27/2016 Elsevier Patient Education  2020 [employer].

## 2020-07-14 ENCOUNTER — Other Ambulatory Visit: Payer: Self-pay

## 2020-07-14 ENCOUNTER — Encounter (HOSPITAL_BASED_OUTPATIENT_CLINIC_OR_DEPARTMENT_OTHER): Payer: Self-pay

## 2020-07-14 ENCOUNTER — Emergency Department (HOSPITAL_BASED_OUTPATIENT_CLINIC_OR_DEPARTMENT_OTHER)
Admission: EM | Admit: 2020-07-14 | Discharge: 2020-07-14 | Disposition: A | Discharge status: Home / Self Care | Payer: Medicare Other | Attending: Emergency Medicine | Admitting: Emergency Medicine

## 2020-07-14 DIAGNOSIS — K5900 Constipation, unspecified: Secondary | ICD-10-CM | POA: Diagnosis not present

## 2020-07-14 DIAGNOSIS — Z950 Presence of cardiac pacemaker: Secondary | ICD-10-CM | POA: Diagnosis not present

## 2020-07-14 DIAGNOSIS — K5641 Fecal impaction: Secondary | ICD-10-CM | POA: Diagnosis not present

## 2020-07-14 DIAGNOSIS — Z79899 Other long term (current) drug therapy: Secondary | ICD-10-CM | POA: Insufficient documentation

## 2020-07-14 DIAGNOSIS — Z794 Long term (current) use of insulin: Secondary | ICD-10-CM | POA: Insufficient documentation

## 2020-07-14 DIAGNOSIS — Z7984 Long term (current) use of oral hypoglycemic drugs: Secondary | ICD-10-CM | POA: Insufficient documentation

## 2020-07-14 DIAGNOSIS — I10 Essential (primary) hypertension: Secondary | ICD-10-CM | POA: Diagnosis not present

## 2020-07-14 DIAGNOSIS — E119 Type 2 diabetes mellitus without complications: Secondary | ICD-10-CM | POA: Diagnosis not present

## 2020-07-14 DIAGNOSIS — Z7982 Long term (current) use of aspirin: Secondary | ICD-10-CM | POA: Diagnosis not present

## 2020-07-14 LAB — CBC
HCT: 39.8 % (ref 39.0–52.0)
Hemoglobin: 13.5 g/dL (ref 13.0–17.0)
MCH: 29.4 pg (ref 26.0–34.0)
MCHC: 33.9 g/dL (ref 30.0–36.0)
MCV: 86.7 fL (ref 80.0–100.0)
Platelets: 171 10*3/uL (ref 150–400)
RBC: 4.59 MIL/uL (ref 4.22–5.81)
RDW: 13.2 % (ref 11.5–15.5)
WBC: 9.7 10*3/uL (ref 4.0–10.5)
nRBC: 0 % (ref 0.0–0.2)

## 2020-07-14 LAB — COMPREHENSIVE METABOLIC PANEL
ALT: 30 U/L (ref 0–44)
AST: 39 U/L (ref 15–41)
Albumin: 4.7 g/dL (ref 3.5–5.0)
Alkaline Phosphatase: 72 U/L (ref 38–126)
Anion gap: 14 (ref 5–15)
BUN: 22 mg/dL (ref 8–23)
CO2: 30 mmol/L (ref 22–32)
Calcium: 9.6 mg/dL (ref 8.9–10.3)
Chloride: 92 mmol/L — ABNORMAL LOW (ref 98–111)
Creatinine, Ser: 1.63 mg/dL — ABNORMAL HIGH (ref 0.61–1.24)
GFR, Estimated: 45 mL/min — ABNORMAL LOW (ref >60)
Glucose, Bld: 177 mg/dL — ABNORMAL HIGH (ref 70–99)
Potassium: 3.6 mmol/L (ref 3.5–5.1)
Sodium: 136 mmol/L (ref 135–145)
Total Bilirubin: 1.1 mg/dL (ref 0.3–1.2)
Total Protein: 7.3 g/dL (ref 6.5–8.1)

## 2020-07-14 MED ORDER — POLYETHYLENE GLYCOL 3350 17 GM/SCOOP PO POWD: 17.0000 g | Freq: Two times a day (BID) | ORAL | 0 refills | Status: AC | PRN

## 2020-07-14 NOTE — Discharge Instructions (Signed)
Please read and follow all provided instructions.  Your diagnoses today include:  1. Constipation, unspecified constipation type   2. Fecal impaction (HCC)     Tests performed today include: Blood cell counts (white, red, and platelets) Electrolytes  Kidney function test - stable kidney function Liver function - normal  Vital signs. See below for your results today.   Medications prescribed:   Miralax - laxative  This medication can be found over-the-counter.   Take any medications only as directed on prescription or on packaging.   Home care instructions:  Follow any educational materials contained in this packet.  Follow-up instructions: Please follow-up with your primary care provider in the next week for further evaluation of your symptoms.   Return instructions:   Please return to the Emergency Department if you experience worsening symptoms.   Please return if you have worsening abdominal pain, swelling of your abdomen, persistent vomiting, blood in your stool or vomit, or fever.   Please return if you have any other emergent concerns.  Additional Information:  Your vital signs today were: BP (!) 139/95 (BP Location: Right Arm)   Pulse 80   Temp 98 F (36.7 C) (Oral)   Resp 19   Ht 6' (1.829 m)   Wt 78.9 kg   SpO2 100%   BMI 23.60 kg/m  If your blood pressure (BP) was elevated above 135/85 this visit, please have this repeated by your doctor within one month. --------------

## 2020-07-14 NOTE — ED Provider Notes (Signed)
Shared visit.  Patient here for constipation.  Does not appear to have any symptoms consistent with a bowel obstruction.  Having mostly rectal pain.  Disimpaction was done by PA at the bedside with some improvement.  He has been having pellet/hard stools.  No nausea, no vomiting, no abdominal pain.  Overall appears well.  Recommend MiraLAX and hydration.  Will check basic labs.  This chart was dictated using voice recognition software.  Despite best efforts to proofread,  errors can occur which can change the documentation meaning.     Virgina Norfolk, DO 07/14/20 1824

## 2020-07-14 NOTE — ED Triage Notes (Signed)
Pt states he is constipated, last BM 2 days ago. States took magnesium citrate with no relief. Enema with small hard stool. Complains of rectal pain. Denies abdominal pain, nausea/vomiting.

## 2020-07-14 NOTE — ED Provider Notes (Signed)
[facility] EMERGENCY DEPARTMENT Provider Note   CSN: [id number] Arrival date & time: 07/14/20  1325     History Chief Complaint  Patient presents with  . Constipation    Rectal pain    Michael Snyder is a 70 y.o. male.  Patient with history of alcohol abuse presents the emergency department today for evaluation of constipation.  Patient states that he has been constipated over the past week.  He has tried stool softener and magnesium citrate without improvement.  He used a fleets enema 2 days ago and passed a large stool ball.  He states that his stools have been light-colored over the past week, no blood.  No abdominal pain, nausea, vomiting, or diarrhea.  No history of abdominal surgeries.  He states that he was recently told that his kidneys were weak.  The onset of this condition was acute. The course is constant. Aggravating factors: none. Alleviating factors: none.          Past Medical History:  Diagnosis Date  . Alcohol abuse   . Arthritis   . Diabetes mellitus, type 2 ([place])   . Head injury, closed, without LOC 1980s  . History of alcohol abuse   . Hypertension   . 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 Pacemaker    [place]  . Seizure disorder ([place])   . Seizures ([place])    last time in 1990's    Patient Active Problem List   Diagnosis Date Noted  . De Quervain's tenosynovitis, left 03/14/2019  . Left wrist pain 09/23/2018  . Hepatitis C virus infection cured after antiviral drug therapy 12/24/2015  . Cataract, right eye 11/13/2014  . Cataract, left eye 10/18/2014  . Complications, pacemaker cardiac, 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/2014  . Atrial fibrillation ([place]) 11/17/2012  . Cardiac pacemaker MDT 11/26/2011  . Routine health maintenance 06/16/2011  . Essential hypertension 06/01/2008  . Diabetes mellitus type 2, controlled, without complications ([place]) 60/04/9322  . SICK SINUS SYNDROME 03/26/2007  . SEIZURE DISORDER 03/26/2007    Past  Surgical History:  Procedure Laterality Date  . LACERATION REPAIR  1980   scalp  . PACEMAKER LEAD REMOVAL N/A 03/23/2014   Procedure: PACEMAKER LEAD REMOVAL;  Surgeon: Evans Lance, MD;  Location: [place];  Service: Cardiovascular;  Laterality: N/A;  . PTDVP     7/08 medtronic DDD       Family History  Problem Relation Age of Onset  . Hypertension Mother   . COPD Mother   . Hypertension Brother   . Cancer Brother        leukemia  . Hypertension Brother   . Diabetes Neg Hx     Social History   Tobacco Use  . Smoking status: Never Smoker  . Smokeless tobacco: Never Used  Vaping Use  . Vaping Use: Never used  Substance Use Topics  . Alcohol use: No    Comment: quit 20 yrs ago  . Drug use: No    Home Medications Prior to Admission medications   Medication Sig Start Date End Date Taking? Authorizing Provider  aspirin EC 81 MG tablet Take 81 mg daily by mouth.    [provider]  atorvastatin (LIPITOR) 20 MG tablet Take 1 tablet (20 mg total) by mouth daily. 06/19/20   Hoyt Koch, MD  Blood Pressure Monitoring (BLOOD PRESSURE KIT) DEVI 1 kit by Does not apply route daily. 04/20/19   Hoyt Koch, MD  brimonidine [facility])  0.2 % ophthalmic solution SMARTSIG:In Eye(s) 05/14/20   [provider]  Calcium Carbonate-Vitamin D (CALTRATE 600+D) 600-400 MG-UNIT per tablet Take 1 tablet by mouth 2 (two) times daily. 07/12/13   Norins, Heinz Knuckles, MD  Cholecalciferol (VITAMIN D) 50 MCG (2000 UT) tablet Take 2,000 Units by mouth daily.    [provider]  insulin glargine (LANTUS SOLOSTAR) 100 UNIT/ML Solostar Pen Inject 10 Units into the skin every morning. And pen needles 1/day 06/11/20   Renato Shin, MD  Lancets (ONETOUCH DELICA PLUS [id number]) [place]  DAILY 04/08/20   Hoyt Koch, MD  latanoprost Ivin Poot) 0.005 % ophthalmic solution SMARTSIG:In Eye(s) 05/14/20   [provider]  Multiple Vitamin  (MULTIVITAMIN WITH MINERALS) TABS tablet Take 1 tablet by mouth daily.    [provider]  Omega-3 Fatty Acids (FISH OIL) 300 MG CAPS Take 1 capsule (300 mg total) by mouth daily. 07/12/13   Norins, Heinz Knuckles, MD  [facility] VERIO test strip TEST TWICE DAILY 06/23/19   Hoyt Koch, MD  pioglitazone (ACTOS) 30 MG tablet Take 30 mg by mouth daily.    [provider]  Semaglutide (RYBELSUS) 14 MG TABS Take 14 mg by mouth daily. 11/28/19   Renato Shin, MD  triamcinolone ointment (KENALOG) 0.5 % APPLY TOPICALLY TWICE DAILY 04/08/20   Hoyt Koch, MD  triamterene-hydrochlorothiazide (MAXZIDE-25) 37.5-25 MG tablet Take 1 tablet by mouth daily. 06/18/20   Hoyt Koch, MD    Allergies    Clindamycin/lincomycin and Ace inhibitors  Review of Systems   Review of Systems  Constitutional: Negative for fever.  HENT: Negative for rhinorrhea and sore throat.   Eyes: Negative for redness.  Respiratory: Negative for cough.   Cardiovascular: Negative for chest pain.  Gastrointestinal: Positive for constipation and rectal pain. Negative for abdominal pain, diarrhea, nausea and vomiting.  Genitourinary: Negative for dysuria and hematuria.  Musculoskeletal: Negative for myalgias.  Skin: Negative for rash.  Neurological: Negative for headaches.    Physical Exam Updated Vital Signs BP (!) 139/95 (BP Location: Right Arm)   Pulse 80   Temp 98 F (36.7 C) (Oral)   Resp 19   Ht 6' (1.829 m)   Wt 78.9 kg   SpO2 100%   BMI 23.60 kg/m   Physical Exam Vitals and nursing note reviewed. Exam conducted with a chaperone present.  Constitutional:      Appearance: He is well-developed and well-nourished.  HENT:     Head: Normocephalic and atraumatic.  Eyes:     General:        Right eye: No discharge.        Left eye: No discharge.     Conjunctiva/sclera: Conjunctivae normal.  Cardiovascular:     Rate and Rhythm: Normal rate and regular rhythm.     Heart  sounds: Normal heart sounds.  Pulmonary:     Effort: Pulmonary effort is normal.     Breath sounds: Normal breath sounds.  Abdominal:     Palpations: Abdomen is soft.     Tenderness: There is no abdominal tenderness. There is no guarding or rebound.  Genitourinary:    Rectum: Tenderness present. No mass.     Comments: Patient with fecal impaction, clay colored stools produced. Musculoskeletal:     Cervical back: Normal range of motion and neck supple.  Skin:    General: Skin is warm and dry.  Neurological:     Mental Status: He is alert.  Psychiatric:  Mood and Affect: Mood and affect normal.     ED Results / Procedures / Treatments   Labs (all labs ordered are listed, but only abnormal results are displayed) Labs Reviewed  COMPREHENSIVE METABOLIC PANEL - Abnormal; Notable for the following components:      Result Value   Chloride 92 (*)    Glucose, Bld 177 (*)    Creatinine, Ser 1.63 (*)    GFR, Estimated 45 (*)    All other components within normal limits  CBC    EKG None  Radiology No results found.  Procedures Fecal disimpaction  Date/Time: 07/14/2020 7:34 PM Performed by: Carlisle Cater, PA-C Authorized by: Carlisle Cater, PA-C  Consent given by: patient Patient identity confirmed: verbally with patient  Sedation: Patient sedated: no  Patient tolerance: patient tolerated the procedure well with no immediate complications Comments: Sticky clay colored stool removed with finger.     (including critical care time)  Medications Ordered in ED Medications - No data to display  ED Course  I have reviewed the triage vital signs and the nursing notes.  Pertinent labs & imaging results that were available during my care of the patient were reviewed by me and considered in my medical decision making (see chart for details).  Patient seen and examined. History concerning for fecal impaction. Disimpaction performed with RN chaperone.  Stools are clay in  color without signs of melena or blood.  Will obtain labs to ensure no liver dysfunction, perform enema.  Patient appears comfortable.  Vital signs reviewed and are as follows: BP (!) 139/95 (BP Location: Right Arm)   Pulse 80   Temp 98 F (36.7 C) (Oral)   Resp 19   Ht 6' (1.829 m)   Wt 78.9 kg   SpO2 100%   BMI 23.60 kg/m   7:34 PM labs reassuring and stable.  Patient feels much better after enema.  Home with MiraLAX.  Urged PCP follow-up as needed.  The patient was urged to return to the Emergency Department immediately with worsening of current symptoms, worsening abdominal pain, persistent vomiting, blood noted in stools, fever, or any other concerns. The patient verbalized understanding.     MDM Rules/Calculators/A&P                          Patient with fecal impaction and constipation.  Treated in ED with great results.  Feels much better now.  Plan for continued MiraLAX until stools are regular.  Labs reassuring.  No indication for imaging tonight.    Final Clinical Impression(s) / ED Diagnoses Final diagnoses:  Constipation, unspecified constipation type  Fecal impaction [facility])    Rx / DC Orders ED Discharge Orders         Ordered    polyethylene glycol powder (GLYCOLAX/MIRALAX) 17 GM/SCOOP powder  2 times daily PRN        07/14/20 1918           Carlisle Cater, PA-C 07/14/20 1935    Lennice Sites, DO 07/14/20 2319

## 2020-07-16 ENCOUNTER — Other Ambulatory Visit: Payer: Self-pay | Admitting: Internal Medicine

## 2020-07-17 NOTE — Telephone Encounter (Signed)
Glipizide was d/c'd?  

## 2020-07-18 ENCOUNTER — Other Ambulatory Visit: Payer: Self-pay

## 2020-07-18 ENCOUNTER — Ambulatory Visit (INDEPENDENT_AMBULATORY_CARE_PROVIDER_SITE_OTHER): Payer: Medicare Other | Admitting: Internal Medicine

## 2020-07-18 ENCOUNTER — Encounter: Payer: Self-pay | Admitting: Internal Medicine

## 2020-07-18 VITALS — BP 120/80 | HR 85 | Temp 98.3°F | Ht 72.0 in | Wt 162.0 lb

## 2020-07-18 DIAGNOSIS — K5900 Constipation, unspecified: Secondary | ICD-10-CM | POA: Diagnosis not present

## 2020-07-18 MED ORDER — LINACLOTIDE 145 MCG PO CAPS: 145.0000 ug | ORAL_CAPSULE | Freq: Every day | ORAL | 3 refills | Status: DC

## 2020-07-18 NOTE — Assessment & Plan Note (Signed)
We talked about water and fiber intake to help some. Rx linzess 145 mg daily. Let us know if dosing too strong or weak.

## 2020-07-18 NOTE — Patient Instructions (Signed)
We have sent linzess to try daily for the GI system.

## 2020-07-18 NOTE — Progress Notes (Signed)
   Subjective:   Patient ID: Michael Snyder, male    DOB: 1950-12-17, 70 y.o.   MRN: [id number]  HPI The patient is a 70 YO man coming in for ER follow up for constipation. He was given enema and disimpaction in the ER. Denies change in diet recently. Some changes with his diabetes medicines. Denies new supplements. Did try miralax from ER but this did not help. He has used fleet enema several times since then. Last BM this morning. Overall he does not feel well controlled with this. Last colonoscopy 2019 and due again 2024.  Review of Systems  Constitutional: Negative.   HENT: Negative.   Eyes: Negative.   Respiratory: Negative for cough, chest tightness and shortness of breath.   Cardiovascular: Negative for chest pain, palpitations and leg swelling.  Gastrointestinal: Positive for constipation. Negative for abdominal distention, abdominal pain, diarrhea, nausea and vomiting.  Musculoskeletal: Negative.   Skin: Negative.   Neurological: Negative.   Psychiatric/Behavioral: Negative.     Objective:  Physical Exam Constitutional:      Appearance: He is well-developed and well-nourished.  HENT:     Head: Normocephalic and atraumatic.  Eyes:     Extraocular Movements: EOM normal.  Cardiovascular:     Rate and Rhythm: Normal rate and regular rhythm.  Pulmonary:     Effort: Pulmonary effort is normal. No respiratory distress.     Breath sounds: Normal breath sounds. No wheezing or rales.  Abdominal:     General: Bowel sounds are normal. There is no distension.     Palpations: Abdomen is soft. There is no mass.     Tenderness: There is no abdominal tenderness. There is no rebound.  Musculoskeletal:        General: No edema.     Cervical back: Normal range of motion.  Skin:    General: Skin is warm and dry.  Neurological:     Mental Status: He is alert and oriented to person, place, and time.     Coordination: Coordination normal.  Psychiatric:        Mood and Affect: Mood and  affect normal.     Vitals:   07/18/20 0835  BP: 120/80  Pulse: 85  Temp: 98.3 F (36.8 C)  TempSrc: Oral  SpO2: 99%  Weight: 162 lb (73.5 kg)  Height: 6' (1.829 m)    This visit occurred during the SARS-CoV-2 public health emergency.  Safety protocols were in place, including screening questions prior to the visit, additional usage of staff PPE, and extensive cleaning of exam room while observing appropriate contact time as indicated for disinfecting solutions.   Assessment & Plan:

## 2020-07-22 ENCOUNTER — Other Ambulatory Visit: Payer: Self-pay | Admitting: Internal Medicine

## 2020-07-29 ENCOUNTER — Ambulatory Visit (INDEPENDENT_AMBULATORY_CARE_PROVIDER_SITE_OTHER): Payer: Medicare Other | Admitting: Internal Medicine

## 2020-07-29 ENCOUNTER — Encounter: Payer: Self-pay | Admitting: Internal Medicine

## 2020-07-29 ENCOUNTER — Other Ambulatory Visit: Payer: Self-pay

## 2020-07-29 VITALS — BP 132/80 | HR 80 | Temp 98.7°F | Resp 18 | Ht 72.0 in | Wt 165.2 lb

## 2020-07-29 DIAGNOSIS — Z23 Encounter for immunization: Secondary | ICD-10-CM | POA: Diagnosis not present

## 2020-07-29 DIAGNOSIS — E119 Type 2 diabetes mellitus without complications: Secondary | ICD-10-CM

## 2020-07-29 DIAGNOSIS — Z794 Long term (current) use of insulin: Secondary | ICD-10-CM | POA: Diagnosis not present

## 2020-07-29 DIAGNOSIS — K5900 Constipation, unspecified: Secondary | ICD-10-CM | POA: Diagnosis not present

## 2020-07-29 NOTE — Assessment & Plan Note (Signed)
Sugars are doing better since he has been throwing up and bowels more regularly. His appetite was down for several days but is back to normal. Denies low sugars.

## 2020-07-29 NOTE — Patient Instructions (Signed)
You can definitely use the rolaids or tums for the stomach.

## 2020-07-29 NOTE — Progress Notes (Addendum)
   Subjective:   Patient ID: Michael Snyder, male    DOB: Oct 01, 1950, 70 y.o.   MRN: [id number]  HPI The patient is a 70 YO man coming in for concerns about recent food poisoning. Started after eating a [employer] with vomiting and stomach pain. Lasted for several days. He is not vomiting any longer. Denies diarrhea at all from this. Denies constipation and has not tried linzess yet but did fill the medication. Denies blood in vomit. Did try baking soda and warm water to help the stomach pain and this helped. Some heartburn last night and used rolaids for this. Overall it is improving.   Review of Systems  Constitutional: Negative.   HENT: Negative.   Eyes: Negative.   Respiratory: Negative for cough, chest tightness and shortness of breath.   Cardiovascular: Negative for chest pain, palpitations and leg swelling.  Gastrointestinal: Positive for abdominal pain. Negative for abdominal distention, constipation, diarrhea, nausea and vomiting.  Musculoskeletal: Negative.   Skin: Negative.   Neurological: Negative.   Psychiatric/Behavioral: Negative.     Objective:  Physical Exam Constitutional:      Appearance: He is well-developed and well-nourished.  HENT:     Head: Normocephalic and atraumatic.  Eyes:     Extraocular Movements: EOM normal.  Cardiovascular:     Rate and Rhythm: Normal rate and regular rhythm.  Pulmonary:     Effort: Pulmonary effort is normal. No respiratory distress.     Breath sounds: Normal breath sounds. No wheezing or rales.  Abdominal:     General: Bowel sounds are normal. There is no distension.     Palpations: Abdomen is soft.     Tenderness: There is no abdominal tenderness. There is no rebound.  Musculoskeletal:        General: No edema.     Cervical back: Normal range of motion.  Skin:    General: Skin is warm and dry.  Neurological:     Mental Status: He is alert and oriented to person, place, and time.     Coordination: Coordination normal.   Psychiatric:        Mood and Affect: Mood and affect normal.     Vitals:   07/29/20 0945  BP: 132/80  Pulse: 80  Resp: 18  Temp: 98.7 F (37.1 C)  TempSrc: Oral  SpO2: 97%  Weight: 165 lb 3.2 oz (74.9 kg)  Height: 6' (1.829 m)    This visit occurred during the SARS-CoV-2 public health emergency.  Safety protocols were in place, including screening questions prior to the visit, additional usage of staff PPE, and extensive cleaning of exam room while observing appropriate contact time as indicated for disinfecting solutions.   Assessment & Plan:  Flu shot given at visit

## 2020-07-29 NOTE — Assessment & Plan Note (Signed)
Improving overall and we discussed to keep the linzess and start if needed.

## 2020-07-30 ENCOUNTER — Other Ambulatory Visit: Payer: Self-pay | Admitting: Internal Medicine

## 2020-07-31 ENCOUNTER — Other Ambulatory Visit: Payer: Self-pay

## 2020-07-31 NOTE — Telephone Encounter (Signed)
Ok to refill? Patient has a appointment with Dr. Everardo All 08/02/2020 at 7:45 am

## 2020-08-01 ENCOUNTER — Other Ambulatory Visit: Payer: Self-pay | Admitting: Internal Medicine

## 2020-08-02 ENCOUNTER — Ambulatory Visit: Payer: Medicare Other | Admitting: Endocrinology

## 2020-08-02 ENCOUNTER — Other Ambulatory Visit: Payer: Self-pay

## 2020-08-02 ENCOUNTER — Ambulatory Visit (INDEPENDENT_AMBULATORY_CARE_PROVIDER_SITE_OTHER): Payer: Medicare Other

## 2020-08-02 VITALS — BP 118/80 | HR 83 | Ht 72.0 in | Wt 161.6 lb

## 2020-08-02 DIAGNOSIS — Z794 Long term (current) use of insulin: Secondary | ICD-10-CM

## 2020-08-02 DIAGNOSIS — I495 Sick sinus syndrome: Secondary | ICD-10-CM | POA: Diagnosis not present

## 2020-08-02 DIAGNOSIS — E039 Hypothyroidism, unspecified: Secondary | ICD-10-CM

## 2020-08-02 DIAGNOSIS — E119 Type 2 diabetes mellitus without complications: Secondary | ICD-10-CM | POA: Diagnosis not present

## 2020-08-02 LAB — GLUCOSE, RANDOM: Glucose, Bld: 211 mg/dL — ABNORMAL HIGH (ref 70–99)

## 2020-08-02 LAB — POCT GLUCOSE (DEVICE FOR HOME USE): Glucose Fasting, POC: 160 mg/dL — AB (ref 70–99)

## 2020-08-02 LAB — POCT GLYCOSYLATED HEMOGLOBIN (HGB A1C): Hemoglobin A1C: 10.4 % — AB (ref 4.0–5.6)

## 2020-08-02 LAB — TSH: TSH: 4.65 u[IU]/mL — ABNORMAL HIGH (ref 0.35–4.50)

## 2020-08-02 MED ORDER — PIOGLITAZONE HCL 30 MG PO TABS: 30.0000 mg | ORAL_TABLET | Freq: Every day | ORAL | 3 refills | Status: DC

## 2020-08-02 NOTE — Progress Notes (Signed)
Subjective:    Patient ID: Michael Snyder, male    DOB: [birth year], 70 y.o.   MRN: [id number]  HPI Pt returns for f/u of diabetes mellitus:  DM type: 2  Dx'ed: 8502 Complications: stage 3 CRI.   Therapy: insulin since 2021, and 2 oral meds DKA: never Severe hypoglycemia: never Pancreatitis: never Pancreatic imaging: CT (2020): Fatty atrophy. Coarse calcification of the pancreatic duct and dorsal duct, and dilatation of the main pancreatic duct.  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   Interval history: He brings his meter with his cbg's which I have reviewed today.  All cbg's say "low.".  He takes meds as rx'ed.  Pt checks cbg during visit (125).  Our meter says 160.  Pt checks on a new meter I have given him (247).    Past Medical History:  Diagnosis Date  . Alcohol abuse   . Arthritis   . Diabetes mellitus, type 2 ([place])   . Head injury, closed, without LOC 1980s  . History of alcohol abuse   . Hypertension   . 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 Pacemaker    [place]  . Seizure disorder ([place])   . Seizures ([place])    last time in 1990's    Past Surgical History:  Procedure Laterality Date  . LACERATION REPAIR  1980   scalp  . PACEMAKER LEAD REMOVAL N/A 03/23/2014   Procedure: PACEMAKER LEAD REMOVAL;  Surgeon: Evans Lance, MD;  Location: [facility];  Service: Cardiovascular;  Laterality: N/A;  . PTDVP     7/08 medtronic DDD    Social History   Socioeconomic History  . Marital status: Legally Separated    Spouse name: Not on file  . Number of children: 3  . Years of education: Not on file  . Highest education level: Not on file  Occupational History  . Occupation: Games developer, truck Geophysicist/field seismologist  Tobacco Use  . Smoking status: Never Smoker  . Smokeless tobacco: Never Used  Vaping Use  . Vaping Use: Never  used  Substance and Sexual Activity  . Alcohol use: No    Comment: quit 20 yrs ago  . Drug use: No  . Sexual activity: Yes    Partners: Female  Other Topics Concern  . Not on file  Social History Narrative   12th grade education. H/O substance abuse- now abstemious and works with other in recovery..Attends NA on a regular basis and serves as a sponsor. Married 18 years-divorced. Married 5 years 2nd marriage.3 daughters previous marriage, 9 grandchildren, 3 great-grands. Work- short haul truck Research officer, political party Strain: [place]   . Difficulty of Paying Living Expenses: Not hard at all  Food Insecurity: No Food Insecurity  . Worried About Charity fundraiser in the Last Year: Never true  . Ran Out of Food in the Last Year: Never true  Transportation Needs: No Transportation Needs  . Lack of Transportation (Medical): No  . Lack of Transportation (Non-Medical): No  Physical Activity: Sufficiently Active  . Days of Exercise per Week: 7 days  . Minutes of Exercise per Session: 150+ min  Stress: No Stress Concern Present  . Feeling of Stress : Not at all  Social Connections: Moderately Integrated  . Frequency of Communication with Friends and  Family: More than three times a week  . Frequency of Social Gatherings with Friends and Family: Once a week  . Attends Religious Services: 1 to 4 times per year  . Active Member of Clubs or Organizations: Yes  . Attends Archivist Meetings: 1 to 4 times per year  . Marital Status: Divorced  Human resources officer Violence: Not At Risk  . Fear of Current or Ex-Partner: No  . Emotionally Abused: No  . Physically Abused: No  . Sexually Abused: No    Current Outpatient Medications on File Prior to Visit  Medication Sig Dispense Refill  . aspirin EC 81 MG tablet Take 81 mg daily by mouth.    [name] atorvastatin (LIPITOR) 20 MG tablet Take 1 tablet (20 mg total) by mouth daily. 90 tablet 3  .  Blood Pressure Monitoring (BLOOD PRESSURE KIT) DEVI 1 kit by Does not apply route daily. 1 each 0  . brimonidine (ALPHAGAN) 0.2 % ophthalmic solution SMARTSIG:In Eye(s)    . Calcium Carbonate-Vitamin D (CALTRATE 600+D) 600-400 MG-UNIT per tablet Take 1 tablet by mouth 2 (two) times daily. 180 tablet 3  . Cholecalciferol (VITAMIN D) 50 MCG (2000 UT) tablet Take 2,000 Units by mouth daily.    . insulin glargine (LANTUS SOLOSTAR) 100 UNIT/ML Solostar Pen Inject 10 Units into the skin every morning. And pen needles 1/day 15 mL 3  . Lancets (ONETOUCH DELICA PLUS [id number]) MISC CHECK BLOOD SUGAR TWICE  DAILY 200 each 3  . latanoprost (XALATAN) 0.005 % ophthalmic solution SMARTSIG:In Eye(s)    . linaclotide (LINZESS) 145 MCG CAPS capsule Take 1 capsule (145 mcg total) by mouth daily before breakfast. 90 capsule 3  . Multiple Vitamin (MULTIVITAMIN WITH MINERALS) TABS tablet Take 1 tablet by mouth daily.    . Omega-3 Fatty Acids (FISH OIL) 300 MG CAPS Take 1 capsule (300 mg total) by mouth daily. 90 capsule 3  . ONETOUCH VERIO test strip TEST TWICE DAILY 200 strip 3  . polyethylene glycol powder (GLYCOLAX/MIRALAX) 17 GM/SCOOP powder Take 17 g by mouth 2 (two) times daily as needed. 255 g 0  . Semaglutide (RYBELSUS) 14 MG TABS Take 14 mg by mouth daily. 90 tablet 3  . triamcinolone ointment (KENALOG) 0.5 % APPLY TOPICALLY TWICE DAILY 90 g 0  . triamterene-hydrochlorothiazide (MAXZIDE-25) 37.5-25 MG tablet Take 1 tablet by mouth daily. 90 tablet 3   No current facility-administered medications on file prior to visit.    Allergies  Allergen Reactions  . Clindamycin/Lincomycin Swelling    Possible facial and lip swelling  . Ace Inhibitors     Facial and lip swelling possibly associated    Family History  Problem Relation Age of Onset  . Hypertension Mother   . COPD Mother   . Hypertension Brother   . Cancer Brother        leukemia  . Hypertension Brother   . Diabetes Neg Hx     BP 118/80  (BP Location: Right Arm, Patient Position: Sitting, Cuff Size: Normal)   Pulse 83   Ht 6' (1.829 m)   Wt 161 lb 9.6 oz (73.3 kg)   SpO2 99%   BMI 21.92 kg/m    Review of Systems He denies hypoglycemia sxs    Objective:   Physical Exam VITAL SIGNS:  See vs page GENERAL: no distress Pulses: dorsalis pedis intact bilat.   MSK: no deformity of the feet CV: no leg edema Skin:  no ulcer on the feet.  normal color and  temp on the feet.  Neuro: sensation is intact to touch on the feet.    Lab Results  Component Value Date   HGBA1C 10.4 (A) 08/02/2020       Assessment & Plan:  Type 2 DM, with CRI: uncontrolled Hypothyroidism, new.  I have sent a prescription to your pharmacy, for synthroid Discordance between A1c and various methods of measuring random glucose today.  Ref CDE.  Patient Instructions  Blood tests are requested for you today.  We'll let you know about the results.    check your blood sugar twice a day.  vary the time of day when you check, between before the 3 meals, and at bedtime.  also check if you have symptoms of your blood sugar being too high or too low.  please keep a record of the readings and bring it to your next appointment here (or you can bring the meter itself).  You can write it on any piece of paper.  please call us sooner if your blood sugar goes below 70, or if you have a lot of readings over 200.   Please come back for a follow-up appointment in 2 months.

## 2020-08-02 NOTE — Patient Instructions (Addendum)
Blood tests are requested for you today.  We'll let you know about the results.   check your blood sugar twice a day.  vary the time of day when you check, between before the 3 meals, and at bedtime.  also check if you have symptoms of your blood sugar being too high or too low.  please keep a record of the readings and bring it to your next appointment here (or you can bring the meter itself).  You can write it on any piece of paper.  please call us sooner if your blood sugar goes below 70, or if you have a lot of readings over 200.   Please come back for a follow-up appointment in 2 months.   

## 2020-08-03 DIAGNOSIS — E039 Hypothyroidism, unspecified: Secondary | ICD-10-CM | POA: Insufficient documentation

## 2020-08-03 LAB — CUP PACEART REMOTE DEVICE CHECK
Battery Impedance: 329 Ohm
Battery Remaining Longevity: 116 mo
Battery Voltage: 2.79 V
Brady Statistic AP VP Percent: 0 %
Brady Statistic AP VS Percent: 7 %
Brady Statistic AS VP Percent: 1 %
Brady Statistic AS VS Percent: 92 %
Date Time Interrogation Session: 20220128133940
Implantable Lead Implant Date: 20080711
Implantable Lead Implant Date: 20150918
Implantable Lead Location: 753859
Implantable Lead Location: 753860
Implantable Lead Model: 5076
Implantable Lead Model: 5076
Implantable Pulse Generator Implant Date: 20150918
Lead Channel Impedance Value: 473 Ohm
Lead Channel Impedance Value: 531 Ohm
Lead Channel Pacing Threshold Amplitude: 0.75 V
Lead Channel Pacing Threshold Amplitude: 0.875 V
Lead Channel Pacing Threshold Pulse Width: 0.4 ms
Lead Channel Pacing Threshold Pulse Width: 0.4 ms
Lead Channel Setting Pacing Amplitude: 2 V
Lead Channel Setting Pacing Amplitude: 2.5 V
Lead Channel Setting Pacing Pulse Width: 0.4 ms
Lead Channel Setting Sensing Sensitivity: 2.8 mV

## 2020-08-03 MED ORDER — LEVOTHYROXINE SODIUM 25 MCG PO TABS: 25.0000 ug | ORAL_TABLET | Freq: Every day | ORAL | 3 refills | Status: DC

## 2020-08-05 ENCOUNTER — Telehealth: Payer: Self-pay | Admitting: Pharmacist

## 2020-08-05 LAB — FRUCTOSAMINE: Fructosamine: 569 umol/L — ABNORMAL HIGH (ref 205–285)

## 2020-08-05 NOTE — Progress Notes (Signed)
° ° °  Chronic Care Management Pharmacy Assistant   Name: Davan Nawabi  MRN: [id number] DOB: Aug 25, 1950  Reason for Encounter: Chart Review   PCP : Hoyt Koch, MD  Allergies:   Allergies  Allergen Reactions   Clindamycin/Lincomycin Swelling    Possible facial and lip swelling   Ace Inhibitors     Facial and lip swelling possibly associated    Medications: Outpatient Encounter Medications as of 08/05/2020  Medication Sig   aspirin EC 81 MG tablet Take 81 mg daily by mouth.   atorvastatin (LIPITOR) 20 MG tablet Take 1 tablet (20 mg total) by mouth daily.   Blood Pressure Monitoring (BLOOD PRESSURE KIT) DEVI 1 kit by Does not apply route daily.   brimonidine (ALPHAGAN) 0.2 % ophthalmic solution SMARTSIG:In Eye(s)   Calcium Carbonate-Vitamin D (CALTRATE 600+D) 600-400 MG-UNIT per tablet Take 1 tablet by mouth 2 (two) times daily.   Cholecalciferol (VITAMIN D) 50 MCG (2000 UT) tablet Take 2,000 Units by mouth daily.   insulin glargine (LANTUS SOLOSTAR) 100 UNIT/ML Solostar Pen Inject 10 Units into the skin every morning. And pen needles 1/day   Lancets (ONETOUCH DELICA PLUS [id number]) MISC CHECK BLOOD SUGAR TWICE  DAILY   latanoprost (XALATAN) 0.005 % ophthalmic solution SMARTSIG:In Eye(s)   levothyroxine (SYNTHROID) 25 MCG tablet Take 1 tablet (25 mcg total) by mouth daily before breakfast.   linaclotide (LINZESS) 145 MCG CAPS capsule Take 1 capsule (145 mcg total) by mouth daily before breakfast.   Multiple Vitamin (MULTIVITAMIN WITH MINERALS) TABS tablet Take 1 tablet by mouth daily.   Omega-3 Fatty Acids (FISH OIL) 300 MG CAPS Take 1 capsule (300 mg total) by mouth daily.   ONETOUCH VERIO test strip TEST TWICE DAILY   pioglitazone (ACTOS) 30 MG tablet Take 1 tablet (30 mg total) by mouth daily.   polyethylene glycol powder (GLYCOLAX/MIRALAX) 17 GM/SCOOP powder Take 17 g by mouth 2 (two) times daily as needed.   Semaglutide (RYBELSUS) 14 MG TABS Take 14  mg by mouth daily.   triamcinolone ointment (KENALOG) 0.5 % APPLY TOPICALLY TWICE DAILY   triamterene-hydrochlorothiazide (MAXZIDE-25) 37.5-25 MG tablet Take 1 tablet by mouth daily.   No facility-administered encounter medications on file as of 08/05/2020.    Current Diagnosis: Patient Active Problem List   Diagnosis Date Noted   Hypothyroidism 08/03/2020   Constipation 07/18/2020   De Quervain's tenosynovitis, left 03/14/2019   Left wrist pain 09/23/2018   Hepatitis C virus infection cured after antiviral drug therapy 12/24/2015   Cataract, right eye 11/13/2014   Cataract, left eye 33/00/7622   Complications, pacemaker cardiac, 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/2014   Atrial fibrillation ([place]) 11/17/2012   Cardiac pacemaker MDT 11/26/2011   Routine health maintenance 06/16/2011   Essential hypertension 06/01/2008   Diabetes mellitus type 2, controlled, without complications ([place]) 63/33/5456   SICK SINUS SYNDROME 03/26/2007   SEIZURE DISORDER 03/26/2007    Goals Addressed   None     Follow-Up:  Pharmacist Review   Reviewed chart for medication changes and adherence.   No gaps in adherence identified. Patient has follow up scheduled with pharmacy team. No further action required.   Wendy Poet, [place] [phone]

## 2020-08-07 ENCOUNTER — Telehealth: Payer: Medicare Other

## 2020-08-07 DIAGNOSIS — H401232 Low-tension glaucoma, bilateral, moderate stage: Secondary | ICD-10-CM | POA: Diagnosis not present

## 2020-08-07 DIAGNOSIS — Z961 Presence of intraocular lens: Secondary | ICD-10-CM | POA: Diagnosis not present

## 2020-08-07 NOTE — Progress Notes (Deleted)
07/29/20 Dr Okey Dupre OV: acute visit for food poisoning concerns. Symptoms resolving, may use Tums/Rolaids.  07/18/20 Dr Okey Dupre OV: f/u constipation. Rx'd Linzess 145 mcg  08/02/20 Dr Everardo All (endocrine): f/u DM. CBG discrepancies on multiple meters - his own meter reads 125, office meter reads 160, new meter given reads 247. A1c 10.4, fructosamine 569. Started levothyroxine 25 mcg and referred to DM education.  07/14/20 ED visit for fecal disimpaction. Rx'd Miralax BID.

## 2020-08-10 NOTE — Progress Notes (Signed)
Remote pacemaker transmission.   

## 2020-08-19 ENCOUNTER — Encounter: Payer: Self-pay | Admitting: Dietician

## 2020-08-19 ENCOUNTER — Encounter: Payer: Medicare Other | Attending: Endocrinology | Admitting: Dietician

## 2020-08-19 ENCOUNTER — Other Ambulatory Visit: Payer: Self-pay

## 2020-08-19 DIAGNOSIS — E119 Type 2 diabetes mellitus without complications: Secondary | ICD-10-CM | POA: Insufficient documentation

## 2020-08-19 DIAGNOSIS — Z794 Long term (current) use of insulin: Secondary | ICD-10-CM | POA: Insufficient documentation

## 2020-08-19 NOTE — Patient Instructions (Addendum)
Consider walking during lunch. Increase your vegetable intake. Consider getting Glucose Tabs and keeping them in your truck and other vehicles.    Continue to take your medication as prescribed.

## 2020-08-19 NOTE — Progress Notes (Unsigned)
Diabetes Self-Management Education  Visit Type: Follow-up  Appt. Start Time: 1020 Appt. End Time: 1130  08/22/2020  [name], identified by name and date of birth, is a 70 y.o. male with a diagnosis of Diabetes: Type 2.   ASSESSMENT Patient is here today alone.  He was last seen by myself 09/2019.  He would like to learn how to improve his A1C.  He states that his blood sugar has been high since being in the hospital this past fall when he received steroids.  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  History includes Type 2 Diabetes, HTN, HLD, CKD3, hypothyroidism Labs noted to include A1C 10.4% 08/02/2020 increased from 9.8 06/11/2020.  His Fructosamine was 569 08/02/2020.  eGFR 45, BUN 22, Creatinine 1.63, Potassium 3.6 07/14/2020. Medications include:  Lantus q HS 8-10 units, Actos, Rybelsus, Vitamin D, Omega 3, Calcium. Sleep good.  Weight hx: 161 lbs 08/19/2020 UBW 179-190 lbs  Patient lives with his brother.  He does much of the shopping and cooking.  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  States that he gets a lot of exercise with his jobs.  He enjoys gardening in the summer.   Weight 161 lb (73 kg). Body mass index is 21.84 kg/m.   Diabetes Self-Management Education - 08/19/20 1047      Visit Information   Visit Type Follow-up      Initial Visit   Diabetes Type Type 2    Are you currently following a meal plan? No    Are you taking your medications as prescribed? Yes    Date Diagnosed 2008      Health Coping   How would you rate your overall health? Fair      Psychosocial Assessment   Patient Belief/Attitude about Diabetes Motivated to manage diabetes    Self-management support Doctor's office;CDE visits    Other persons present Patient    Patient Concerns Nutrition/Meal planning;Glycemic Control    Special Needs None    Preferred Learning Style No preference  indicated    Learning Readiness Change in progress    How often do you need to have someone help you when you read instructions, pamphlets, or other written materials from your doctor or pharmacy? 1 - Never    What is the last grade level you completed in school? 12      Pre-Education Assessment   Patient understands the diabetes disease and treatment process. Needs Review    Patient understands incorporating nutritional management into lifestyle. Needs Review    Patient undertands incorporating physical activity into lifestyle. Needs Review    Patient understands using medications safely. Needs Review    Patient understands monitoring blood glucose, interpreting and using results Needs Review    Patient understands prevention, detection, and treatment of acute complications. Needs Review    Patient understands prevention, detection, and treatment of chronic complications. Needs Review    Patient understands how to develop strategies to address psychosocial issues. Needs Review    Patient understands how to develop strategies to promote health/change behavior. Needs Review      Complications   Last HgB A1C per patient/outside source 10.4 %   08/02/2020 increased from 9.8% 06/11/2020   How often do you check your blood sugar? 1-2 times/day    Fasting Blood glucose range (mg/dL) <70;70-129;130-179    Postprandial Blood glucose range (mg/dL) 180-200;130-179  Number of hypoglycemic episodes per month 4    Can you tell when your blood sugar is low? Yes    What do you do if your blood sugar is low? OJ or candy    Have you had a dilated eye exam in the past 12 months? Yes    Have you had a dental exam in the past 12 months? No    Are you checking your feet? Yes    How many days per week are you checking your feet? 7      Dietary Intake   Breakfast 2 slices [employer] toast with margarine, 2 strips bacon, scrambled eggs OR 2 packs instant oatmeal OR boiled egg, sausage, 2 slices [employer] toast    Snack  (morning) NABS    Lunch RB sandwich, mayo on [employer], occasional chips, fruit    Snack (afternoon) NABS or banana or sardines    Dinner baked fish, 2 hushpuppies, salad OR chicken wings, salad    Snack (evening) salad or NABS or boiled egg    Beverage(s) water, coffee with splenda, tea with splenda      Exercise   Exercise Type Light (walking / raking leaves)   work related   How many days per week to you exercise? 7      Patient Education   Previous Diabetes Education Yes (please comment)   09/2019   Nutrition management  Meal options for control of blood glucose level and chronic complications.;Food label reading, portion sizes and measuring food.    Physical activity and exercise  Role of exercise on diabetes management, blood pressure control and cardiac health.    Medications Reviewed patients medication for diabetes, action, purpose, timing of dose and side effects.    Monitoring Purpose and frequency of SMBG.;Identified appropriate SMBG and/or A1C goals.;Taught/discussed recording of test results and interpretation of SMBG.;Daily foot exams;Yearly dilated eye exam    Acute complications Taught treatment of hypoglycemia - the 15 rule.;Discussed and identified patients' treatment of hyperglycemia.    Psychosocial adjustment Worked with patient to identify barriers to care and solutions;Identified and addressed patients feelings and concerns about diabetes      Individualized Goals (developed by patient)   Nutrition General guidelines for healthy choices and portions discussed    Physical Activity Exercise 5-7 days per week;30 minutes per day    Medications take my medication as prescribed    Monitoring  test my blood glucose as discussed      Post-Education Assessment   Patient understands the diabetes disease and treatment process. Demonstrates understanding / competency    Patient understands incorporating nutritional management into lifestyle. Needs Review    Patient undertands  incorporating physical activity into lifestyle. Demonstrates understanding / competency    Patient understands using medications safely. Needs Review    Patient understands monitoring blood glucose, interpreting and using results Needs Review    Patient understands prevention, detection, and treatment of acute complications. Demonstrates understanding / competency    Patient understands prevention, detection, and treatment of chronic complications. Demonstrates understanding / competency    Patient understands how to develop strategies to address psychosocial issues. Demonstrates understanding / competency    Patient understands how to develop strategies to promote health/change behavior. Needs Review      Outcomes   Expected Outcomes Demonstrated interest in learning. Expect positive outcomes    Future DMSE 2 months    Program Status Not Completed      Subsequent Visit   Since your last visit have  you continued or begun to take your medications as prescribed? Yes    Since your last visit have you had your blood pressure checked? Yes           Individualized Plan for Diabetes Self-Management Training:   Learning Objective:  Patient will have a greater understanding of diabetes self-management. Patient education plan is to attend individual and/or group sessions per assessed needs and concerns.   Plan:   Patient Instructions  Consider walking during lunch. Increase your vegetable intake. Consider getting Glucose Tabs and keeping them in your truck and other vehicles.    Continue to take your medication as prescribed.   Expected Outcomes:  Demonstrated interest in learning. Expect positive outcomes  Education material provided: Meal plan card, My Plate and Snack sheet  If problems or questions, patient to contact team via:  Phone  Future DSME appointment: 2 months

## 2020-09-02 DIAGNOSIS — N1832 Chronic kidney disease, stage 3b: Secondary | ICD-10-CM | POA: Diagnosis not present

## 2020-09-03 DIAGNOSIS — N200 Calculus of kidney: Secondary | ICD-10-CM | POA: Diagnosis not present

## 2020-09-03 DIAGNOSIS — I129 Hypertensive chronic kidney disease with stage 1 through stage 4 chronic kidney disease, or unspecified chronic kidney disease: Secondary | ICD-10-CM | POA: Diagnosis not present

## 2020-09-03 DIAGNOSIS — E1122 Type 2 diabetes mellitus with diabetic chronic kidney disease: Secondary | ICD-10-CM | POA: Diagnosis not present

## 2020-09-03 DIAGNOSIS — I495 Sick sinus syndrome: Secondary | ICD-10-CM | POA: Diagnosis not present

## 2020-09-03 DIAGNOSIS — E559 Vitamin D deficiency, unspecified: Secondary | ICD-10-CM | POA: Diagnosis not present

## 2020-09-03 DIAGNOSIS — N1831 Chronic kidney disease, stage 3a: Secondary | ICD-10-CM | POA: Diagnosis not present

## 2020-09-03 DIAGNOSIS — E876 Hypokalemia: Secondary | ICD-10-CM | POA: Diagnosis not present

## 2020-09-03 DIAGNOSIS — N281 Cyst of kidney, acquired: Secondary | ICD-10-CM | POA: Diagnosis not present

## 2020-09-09 ENCOUNTER — Ambulatory Visit: Payer: Medicare Other | Admitting: Dietician

## 2020-09-23 ENCOUNTER — Other Ambulatory Visit: Payer: Self-pay

## 2020-09-23 ENCOUNTER — Ambulatory Visit (INDEPENDENT_AMBULATORY_CARE_PROVIDER_SITE_OTHER): Payer: Medicare Other | Admitting: Endocrinology

## 2020-09-23 VITALS — BP 140/84 | HR 52 | Ht 72.0 in | Wt 169.4 lb

## 2020-09-23 DIAGNOSIS — E119 Type 2 diabetes mellitus without complications: Secondary | ICD-10-CM

## 2020-09-23 DIAGNOSIS — Z794 Long term (current) use of insulin: Secondary | ICD-10-CM | POA: Diagnosis not present

## 2020-09-23 LAB — POCT GLYCOSYLATED HEMOGLOBIN (HGB A1C): Hemoglobin A1C: 9.5 % — AB (ref 4.0–5.6)

## 2020-09-23 MED ORDER — FREESTYLE LIBRE 2 SENSOR MISC: 1.0000 | 3 refills | Status: DC

## 2020-09-23 MED ORDER — FREESTYLE LIBRE 2 READER DEVI: 1.0000 | Freq: Once | 1 refills | Status: AC

## 2020-09-23 MED ORDER — LANTUS SOLOSTAR 100 UNIT/ML ~~LOC~~ SOPN: 10.0000 [IU] | PEN_INJECTOR | SUBCUTANEOUS | 3 refills | Status: DC

## 2020-09-23 NOTE — Patient Instructions (Addendum)
Please change the Lantus to the morning. Please continue the same other diabetes medications. I have sent a prescription to your pharmacy, for the continuous glucose monitor.   check your blood sugar twice a day.  vary the time of day when you check, between before the 3 meals, and at bedtime.  also check if you have symptoms of your blood sugar being too high or too low.  please keep a record of the readings and bring it to your next appointment here (or you can bring the meter itself).  You can write it on any piece of paper.  please call us sooner if your blood sugar goes below 70, or if you have a lot of readings over 200.   Please come back for a follow-up appointment in 2 months.

## 2020-09-23 NOTE — Progress Notes (Signed)
Subjective:    Patient ID: Michael Snyder, male    DOB: October 24, 1950, 70 y.o.   MRN: [id number]  HPI Pt returns for f/u of diabetes mellitus:  DM type: Insulin-requiring type 2 Dx'ed: 5701 Complications: stage 3 CRI.   Therapy: insulin since 2021, and 2 oral meds DKA: never Severe hypoglycemia: never Pancreatitis: never Pancreatic imaging: CT (2020): Fatty atrophy. Coarse calcification of the pancreatic duct and dorsal duct, and dilatation of the main pancreatic duct.  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  It is in general higher as the day goes on.  He takes meds as rx'ed.  pt states he feels well in general.  He takes Lantus at HS.  He requests continuous glucose monitor.   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   Pacemaker    Medtronic Adapta ADDR01   Seizure disorder ([place])    Seizures ([place])    last time in 1990's    Past Surgical History:  Procedure Laterality Date   LACERATION REPAIR  1980   scalp   PACEMAKER LEAD REMOVAL N/A 03/23/2014   Procedure: PACEMAKER LEAD REMOVAL;  Surgeon: Evans Lance, MD;  Location: [place];  Service: Cardiovascular;  Laterality: N/A;   PTDVP     7/08 medtronic DDD    Social History   Socioeconomic History   Marital status: Legally Separated    Spouse name: Not on file   Number of children: 3   Years of education: Not on file   Highest education level: Not on file  Occupational History   Occupation: Games developer, truck driver  Tobacco Use   Smoking status: Never Smoker   Smokeless tobacco: Never Used  Scientific laboratory technician Use: Never  used  Substance and Sexual Activity   Alcohol use: No    Comment: quit 20 yrs ago   Drug use: No   Sexual activity: Yes    Partners: Female  Other Topics Concern   Not on file  Social History Narrative   12th grade education. H/O substance abuse- now abstemious and works with other in recovery..Attends NA on a regular basis and serves as a sponsor. Married 18 years-divorced. Married 5 years 2nd marriage.3 daughters previous marriage, 9 grandchildren, 3 great-grands. Work- Merchant navy officer            Social Determinants of Radio broadcast assistant Strain: Low Risk    Difficulty of Paying Living Expenses: Not hard at all  Food Insecurity: No Food Insecurity   Worried About Charity fundraiser in the Last Year: Never true   Arboriculturist in the Last Year: Never true  Transportation Needs: No Transportation Needs   Film/video editor (Medical): No   Lack of Transportation (Non-Medical): No  Physical Activity: Sufficiently Active   Days of Exercise per Week: 7 days   Minutes of Exercise per Session: 150+ min  Stress: No Stress Concern Present   Feeling of Stress : Not at all  Social Connections:  Moderately Integrated   Frequency of Communication with Friends and Family: More than three times a week   Frequency of Social Gatherings with Friends and Family: Once a week   Attends Religious Services: 1 to 4 times per year   Active Member of [employer] or Organizations: Yes   Attends Archivist Meetings: 1 to 4 times per year   Marital Status: Divorced  Human resources officer Violence: Not At Risk   Fear of Current or Ex-Partner: No   Emotionally Abused: No   Physically Abused: No   Sexually Abused: No    Current Outpatient Medications on File Prior to Visit  Medication Sig Dispense Refill   aspirin EC 81 MG tablet Take 81 mg daily by mouth.     atorvastatin (LIPITOR) 20 MG tablet Take 1 tablet (20 mg total) by mouth daily. 90 tablet 3    Blood Pressure Monitoring (BLOOD PRESSURE KIT) DEVI 1 kit by Does not apply route daily. 1 each 0   brimonidine (ALPHAGAN) 0.2 % ophthalmic solution SMARTSIG:In Eye(s)     Calcium Carbonate-Vitamin D (CALTRATE 600+D) 600-400 MG-UNIT per tablet Take 1 tablet by mouth 2 (two) times daily. 180 tablet 3   Cholecalciferol (VITAMIN D) 50 MCG (2000 UT) tablet Take 2,000 Units by mouth daily.     Lancets (ONETOUCH DELICA PLUS [id number]) MISC CHECK BLOOD SUGAR TWICE  DAILY 200 each 3   latanoprost (XALATAN) 0.005 % ophthalmic solution SMARTSIG:In Eye(s)     levothyroxine (SYNTHROID) 25 MCG tablet Take 1 tablet (25 mcg total) by mouth daily before breakfast. 90 tablet 3   linaclotide (LINZESS) 145 MCG CAPS capsule Take 1 capsule (145 mcg total) by mouth daily before breakfast. 90 capsule 3   Multiple Vitamin (MULTIVITAMIN WITH MINERALS) TABS tablet Take 1 tablet by mouth daily.     Omega-3 Fatty Acids (FISH OIL) 300 MG CAPS Take 1 capsule (300 mg total) by mouth daily. 90 capsule 3   ONETOUCH VERIO test strip TEST TWICE DAILY 200 strip 3   pioglitazone (ACTOS) 30 MG tablet Take 1 tablet (30 mg total) by mouth daily. 90 tablet 3   polyethylene glycol powder (GLYCOLAX/MIRALAX) 17 GM/SCOOP powder Take 17 g by mouth 2 (two) times daily as needed. 255 g 0   Semaglutide (RYBELSUS) 14 MG TABS Take 14 mg by mouth daily. 90 tablet 3   triamcinolone ointment (KENALOG) 0.5 % APPLY TOPICALLY TWICE DAILY 90 g 0   triamterene-hydrochlorothiazide (MAXZIDE-25) 37.5-25 MG tablet Take 1 tablet by mouth daily. 90 tablet 3   No current facility-administered medications on file prior to visit.    Allergies  Allergen Reactions   Clindamycin/Lincomycin Swelling    Possible facial and lip swelling   Ace Inhibitors     Facial and lip swelling possibly associated    Family History  Problem Relation Age of Onset   Hypertension Mother    COPD Mother    Hypertension Brother    Cancer Brother         leukemia   Hypertension Brother    Diabetes Neg Hx     BP 140/84 (BP Location: Right Arm, Patient Position: Sitting, Cuff Size: Normal)    Pulse (!) 52    Ht 6' (1.829 m)    Wt 169 lb 6.4 oz (76.8 kg)    SpO2 99%    BMI 22.97 kg/m    Review of Systems     Objective:   Physical Exam VITAL SIGNS:  See vs page GENERAL: no distress  Pulses: dorsalis pedis intact bilat.   MSK: no deformity of the feet CV: no leg edema Skin:  no ulcer on the feet.  normal color and temp on the feet. Neuro: sensation is intact to touch on the feet Ext: there is bilateral onychomycosis of the toenails.     Lab Results  Component Value Date   HGBA1C 9.5 (A) 09/23/2020       Assessment & Plan:  Insulin-requiring type 2 DM, with stage 3 CRI: uncontrolled Hypoglycemia, due to insulin: this limits aggressiveness of glycemic control.  We'll try changing Lantus to the morning  Patient Instructions  Please change the Lantus to the morning. Please continue the same other diabetes medications. I have sent a prescription to your pharmacy, for the continuous glucose monitor.   check your blood sugar twice a day.  vary the time of day when you check, between before the 3 meals, and at bedtime.  also check if you have symptoms of your blood sugar being too high or too low.  please keep a record of the readings and bring it to your next appointment here (or you can bring the meter itself).  You can write it on any piece of paper.  please call us sooner if your blood sugar goes below 70, or if you have a lot of readings over 200.   Please come back for a follow-up appointment in 2 months.

## 2020-10-10 DIAGNOSIS — N1831 Chronic kidney disease, stage 3a: Secondary | ICD-10-CM | POA: Diagnosis not present

## 2020-10-29 ENCOUNTER — Other Ambulatory Visit: Payer: Self-pay | Admitting: Endocrinology

## 2020-10-30 ENCOUNTER — Telehealth: Payer: Self-pay | Admitting: Endocrinology

## 2020-10-30 ENCOUNTER — Other Ambulatory Visit: Payer: Self-pay

## 2020-10-30 MED ORDER — LANTUS SOLOSTAR 100 UNIT/ML ~~LOC~~ SOPN: 10.0000 [IU] | PEN_INJECTOR | SUBCUTANEOUS | 3 refills | Status: DC

## 2020-10-30 NOTE — Telephone Encounter (Signed)
Patient called re: [facility] did not receive RX for insulin glargine (LANTUS SOLOSTAR) 100 UNIT/ML Solostar Pen. (no print). Patient is almost out of insulin and requests a new RX for insulin glargine (LANTUS SOLOSTAR) 100 UNIT/ML Solostar Pen be sent to  [id number] [address] [place] [address] - [address] Phone:  [phone]  Fax:  [phone]     However, Patient requests that after the above RX is sent to [facility] that all future RX's/refills for the above mentioned medication be sent to: [facility] [place], [place] - [address], [address] Phone:  [phone]  Fax:  [phone]     Patient states he has been waiting for the above new medication since 09/23/20.  Also, Patient states a PA is required by [employer] for the [employer] and the [employer]

## 2020-10-30 NOTE — Telephone Encounter (Signed)
Rx sent to [facility]

## 2020-11-07 ENCOUNTER — Other Ambulatory Visit: Payer: Self-pay

## 2020-11-07 MED ORDER — LANTUS SOLOSTAR 100 UNIT/ML ~~LOC~~ SOPN: 10.0000 [IU] | PEN_INJECTOR | SUBCUTANEOUS | 3 refills | Status: DC

## 2020-11-07 NOTE — Telephone Encounter (Signed)
Patient states lantus was never received at [facility] - patient is out of insulin currently.   [facility] on [address]

## 2020-11-12 DIAGNOSIS — H401232 Low-tension glaucoma, bilateral, moderate stage: Secondary | ICD-10-CM | POA: Diagnosis not present

## 2020-11-12 DIAGNOSIS — H35033 Hypertensive retinopathy, bilateral: Secondary | ICD-10-CM | POA: Diagnosis not present

## 2020-11-12 DIAGNOSIS — E119 Type 2 diabetes mellitus without complications: Secondary | ICD-10-CM | POA: Diagnosis not present

## 2020-11-12 DIAGNOSIS — Z961 Presence of intraocular lens: Secondary | ICD-10-CM | POA: Diagnosis not present

## 2020-11-14 ENCOUNTER — Telehealth: Payer: Self-pay | Admitting: Pharmacist

## 2020-11-19 NOTE — Progress Notes (Signed)
Chronic Care Management Pharmacy Assistant   Name: Michael Snyder  MRN: [id number] DOB: 20-Jan-1951   Reason for Encounter: Disease StateDiabetic Call   Conditions to be addressed/monitored: DMII   Recent office visits:  07/18/20 Dr. Sharlet Snyder ordered Linzess 145 mg daily 07/29/20 Dr. Sharlet Snyder, no medication changes  Recent consult visits:  08/02/20 Dr. Renato Snyder ordered Levothyroxine 25 mcg  09/23/20 Dr. Renato Snyder, no medication changes  Hospital visits:  Medication Reconciliation was completed by comparing discharge summary, patient's EMR and Pharmacy list, and upon discussion with patient.  Admitted to the hospital on 07/14/20 due to constipation Discharge date was 07/14/20. Discharged from [facility]?Medications Started at [facility] Discharge:?? -started None ID  Medication Changes at Hospital Discharge: -Changed None ID  Medications Discontinued at Hospital Discharge: -Stopped None ID  Medications that remain the same after Hospital Discharge:??  -All other medications will remain the same.    Medications: Outpatient Encounter Medications as of 11/14/2020  Medication Sig  . aspirin EC 81 MG tablet Take 81 mg daily by mouth.  [name] atorvastatin (LIPITOR) 20 MG tablet Take 1 tablet (20 mg total) by mouth daily.  . Blood Pressure Monitoring (BLOOD PRESSURE KIT) DEVI 1 kit by Does not apply route daily.  . brimonidine (ALPHAGAN) 0.2 % ophthalmic solution SMARTSIG:In Eye(s)  . Calcium Carbonate-Vitamin D (CALTRATE 600+D) 600-400 MG-UNIT per tablet Take 1 tablet by mouth 2 (two) times daily.  . Cholecalciferol (VITAMIN D) 50 MCG (2000 UT) tablet Take 2,000 Units by mouth daily.  . Continuous Blood Gluc Sensor (FREESTYLE LIBRE 2 SENSOR) MISC 1 Device by Does not apply route every 14 (fourteen) days.  . insulin glargine (LANTUS SOLOSTAR) 100 UNIT/ML Solostar Pen Inject 10 Units into the skin every morning. And pen needles 1/day  . Lancets (ONETOUCH  DELICA PLUS [id number]) MISC CHECK BLOOD SUGAR TWICE  DAILY  . latanoprost (XALATAN) 0.005 % ophthalmic solution SMARTSIG:In Eye(s)  . levothyroxine (SYNTHROID) 25 MCG tablet Take 1 tablet (25 mcg total) by mouth daily before breakfast.  . linaclotide (LINZESS) 145 MCG CAPS capsule Take 1 capsule (145 mcg total) by mouth daily before breakfast.  . Multiple Vitamin (MULTIVITAMIN WITH MINERALS) TABS tablet Take 1 tablet by mouth daily.  . Omega-3 Fatty Acids (FISH OIL) 300 MG CAPS Take 1 capsule (300 mg total) by mouth daily.  Glory Rosebush VERIO test strip TEST TWICE DAILY  . pioglitazone (ACTOS) 30 MG tablet Take 1 tablet (30 mg total) by mouth daily.  . polyethylene glycol powder (GLYCOLAX/MIRALAX) 17 GM/SCOOP powder Take 17 g by mouth 2 (two) times daily as needed.  . RYBELSUS 14 MG TABS TAKE 1 TABLET BY MOUTH  DAILY  . triamcinolone ointment (KENALOG) 0.5 % APPLY TOPICALLY TWICE DAILY  . triamterene-hydrochlorothiazide (MAXZIDE-25) 37.5-25 MG tablet Take 1 tablet by mouth daily.   No facility-administered encounter medications on file as of 11/14/2020.    Recent Relevant Labs: Lab Results  Component Value Date/Time   HGBA1C 9.5 (A) 09/23/2020 09:02 AM   HGBA1C 10.4 (A) 08/02/2020 07:43 AM   HGBA1C 10.6 (H) 04/20/2019 08:55 AM   HGBA1C 8.8 (H) 05/02/2018 04:41 PM   MICROALBUR 0.9 06/17/2020 03:40 PM   MICROALBUR 5.0 (H) 04/20/2019 08:55 AM    Kidney Function Lab Results  Component Value Date/Time   CREATININE 1.63 (H) 07/14/2020 06:23 PM   CREATININE 1.63 (H) 06/17/2020 03:40 PM   CREATININE 1.63 (H) 05/02/2015 11:06 AM   GFR 42.35 (L) 06/17/2020  03:40 PM   GFRNONAA 45 (L) 07/14/2020 06:23 PM   GFRAA 54 (L) 11/29/2018 03:11 PM    . Current antihyperglycemic regimen:  Lantus 100 units, pioglitazone 30 mg daily, Rybelsus 14 mg daily  . What recent interventions/DTPs have been made to improve glycemic control: Patient is to start taking Lantus in the morning per Dr.  Loanne Snyder  . Have there been any recent hospitalizations or ED visits since last visit with [facility]? Yes,patient was seen at [facility] in [place] for constipation  . Patient denies hypoglycemic symptoms, including None   . Patient denies hyperglycemic symptoms, including none   . How often are you checking your blood sugar? once daily . What are your blood sugars ranging?  o Fasting:  o Before meals: 239 o After meals:  o Bedtime:   . During the week, how often does your blood glucose drop below 70? Never   . Are you checking your feet daily/regularly? Patient states that she checks his feet and has no problems  Adherence Review: Is the patient currently on a STATIN medication? Yes Is the patient currently on ACE/ARB medication? No Does the patient have >5 day gap between last estimated fill dates? No  Star Rating Drugs: Atorvastatin 10/16/20 90 ds  [place] Pharmacist Assistant [phone]  Time spent:23

## 2020-11-21 ENCOUNTER — Other Ambulatory Visit: Payer: Self-pay | Admitting: Internal Medicine

## 2020-11-26 ENCOUNTER — Other Ambulatory Visit: Payer: Self-pay

## 2020-11-26 ENCOUNTER — Ambulatory Visit: Payer: Medicare Other | Admitting: Endocrinology

## 2020-11-26 VITALS — BP 110/80 | HR 81 | Ht 72.0 in | Wt 155.4 lb

## 2020-11-26 DIAGNOSIS — Z794 Long term (current) use of insulin: Secondary | ICD-10-CM | POA: Diagnosis not present

## 2020-11-26 DIAGNOSIS — E119 Type 2 diabetes mellitus without complications: Secondary | ICD-10-CM

## 2020-11-26 NOTE — Patient Instructions (Addendum)
Please see a diabetes education specialist.    check your blood sugar twice a day.  vary the time of day when you check, between before the 3 meals, and at bedtime.  also check if you have symptoms of your blood sugar being too high or too low.  please keep a record of the readings and bring it to your next appointment here (or you can bring the meter itself).  You can write it on any piece of paper.  please call us sooner if your blood sugar goes below 70, or if you have a lot of readings over 200.   Please come back for a follow-up appointment in 2 months.

## 2020-11-26 NOTE — Progress Notes (Signed)
Subjective:    Patient ID: Michael Snyder, male    DOB: 06-13-1951, 70 y.o.   MRN: [id number]  HPI Pt returns for f/u of diabetes mellitus:  DM type: Insulin-requiring type 2 Dx'ed: 1540 Complications: stage 3 CRI.   Therapy: insulin since 2021, and 2 oral meds.   DKA: never Severe hypoglycemia: never Pancreatitis: never Pancreatic imaging: CT (2020): Fatty atrophy. Calcification of the ducts, and dilatation of the main duct.   SDOH: he needs a CDL to work Other: fructosamine converts to higher A1c than A1c itself and cbg's; pt meter cbg is similar to office meter.   Interval history: no cbg record, but states cbg's vary from 145-230.  pt states he feels well in general.  He takes Lantus at HS.  He slips insulin several times per week, due to cbg in the 100's.   Past Medical History:  Diagnosis Date  . Alcohol abuse   . Arthritis   . Diabetes mellitus, type 2 ([place])   . Head injury, closed, without LOC 1980s  . History of alcohol abuse   . Hyperlipidemia   . Hypertension   . 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 Pacemaker    [place]  . Seizure disorder ([place])   . Seizures ([place])    last time in 1990's    Past Surgical History:  Procedure Laterality Date  . LACERATION REPAIR  1980   scalp  . PACEMAKER LEAD REMOVAL N/A 03/23/2014   Procedure: PACEMAKER LEAD REMOVAL;  Surgeon: Evans Lance, MD;  Location: [facility];  Service: Cardiovascular;  Laterality: N/A;  . PTDVP     7/08 medtronic DDD    Social History   Socioeconomic History  . Marital status: Legally Separated    Spouse name: Not on file  . Number of children: 3  . Years of education: Not on file  . Highest education level: Not on file  Occupational History  . Occupation: Games developer, truck Geophysicist/field seismologist  Tobacco Use  . Smoking status: Never Smoker  . Smokeless tobacco: Never Used  Vaping Use  . Vaping Use: Never used  Substance and Sexual Activity  . Alcohol use: No    Comment: quit 20 yrs  ago  . Drug use: No  . Sexual activity: Yes    Partners: Female  Other Topics Concern  . Not on file  Social History Narrative   12th grade education. H/O substance abuse- now abstemious and works with other in recovery..Attends NA on a regular basis and serves as a sponsor. Married 18 years-divorced. Married 5 years 2nd marriage.3 daughters previous marriage, 9 grandchildren, 3 great-grands. Work- short haul truck Research officer, political party Strain: [place]   . Difficulty of Paying Living Expenses: Not hard at all  Food Insecurity: No Food Insecurity  . Worried About Charity fundraiser in the Last Year: Never true  . Ran Out of Food in the Last Year: Never true  Transportation Needs: No Transportation Needs  . Lack of Transportation (Medical): No  . Lack of Transportation (Non-Medical): No  Physical Activity: Sufficiently Active  . Days of Exercise per Week: 7 days  . Minutes of Exercise per Session: 150+ min  Stress: No Stress Concern Present  . Feeling of Stress : Not at all  Social Connections: Moderately Integrated  . Frequency of Communication with Friends and Family: More  than three times a week  . Frequency of Social Gatherings with Friends and Family: Once a week  . Attends Religious Services: 1 to 4 times per year  . Active Member of Clubs or Organizations: Yes  . Attends Archivist Meetings: 1 to 4 times per year  . Marital Status: Divorced  Human resources officer Violence: Not At Risk  . Fear of Current or Ex-Partner: No  . Emotionally Abused: No  . Physically Abused: No  . Sexually Abused: No    Current Outpatient Medications on File Prior to Visit  Medication Sig Dispense Refill  . aspirin EC 81 MG tablet Take 81 mg daily by mouth.    [name] atorvastatin (LIPITOR) 20 MG tablet Take 1 tablet (20 mg total) by mouth daily. 90 tablet 3  . Blood Pressure Monitoring (BLOOD PRESSURE KIT) DEVI 1 kit by Does not apply route  daily. 1 each 0  . brimonidine (ALPHAGAN) 0.2 % ophthalmic solution SMARTSIG:In Eye(s)    . Calcium Carbonate-Vitamin D (CALTRATE 600+D) 600-400 MG-UNIT per tablet Take 1 tablet by mouth 2 (two) times daily. 180 tablet 3  . Cholecalciferol (VITAMIN D) 50 MCG (2000 UT) tablet Take 2,000 Units by mouth daily.    . Continuous Blood Gluc Sensor (FREESTYLE LIBRE 2 SENSOR) MISC 1 Device by Does not apply route every 14 (fourteen) days. 6 each 3  . insulin glargine (LANTUS SOLOSTAR) 100 UNIT/ML Solostar Pen Inject 10 Units into the skin every morning. And pen needles 1/day 15 mL 3  . Lancets (ONETOUCH DELICA PLUS [id number]) MISC CHECK BLOOD SUGAR TWICE  DAILY 200 each 3  . latanoprost (XALATAN) 0.005 % ophthalmic solution SMARTSIG:In Eye(s)    . levothyroxine (SYNTHROID) 25 MCG tablet Take 1 tablet (25 mcg total) by mouth daily before breakfast. 90 tablet 3  . linaclotide (LINZESS) 145 MCG CAPS capsule Take 1 capsule (145 mcg total) by mouth daily before breakfast. 90 capsule 3  . Multiple Vitamin (MULTIVITAMIN WITH MINERALS) TABS tablet Take 1 tablet by mouth daily.    . Omega-3 Fatty Acids (FISH OIL) 300 MG CAPS Take 1 capsule (300 mg total) by mouth daily. 90 capsule 3  . ONETOUCH VERIO test strip TEST TWICE DAILY 200 strip 3  . pioglitazone (ACTOS) 30 MG tablet Take 1 tablet (30 mg total) by mouth daily. 90 tablet 3  . polyethylene glycol powder (GLYCOLAX/MIRALAX) 17 GM/SCOOP powder Take 17 g by mouth 2 (two) times daily as needed. 255 g 0  . RYBELSUS 14 MG TABS TAKE 1 TABLET BY MOUTH  DAILY 90 tablet 3  . triamcinolone ointment (KENALOG) 0.5 % APPLY TOPICALLY TWICE DAILY 90 g 0  . triamterene-hydrochlorothiazide (MAXZIDE-25) 37.5-25 MG tablet Take 1 tablet by mouth daily. 90 tablet 3   No current facility-administered medications on file prior to visit.    Allergies  Allergen Reactions  . Clindamycin/Lincomycin Swelling    Possible facial and lip swelling  . Ace Inhibitors     Facial and lip  swelling possibly associated    Family History  Problem Relation Age of Onset  . Hypertension Mother   . COPD Mother   . Hypertension Brother   . Cancer Brother        leukemia  . Hypertension Brother   . Diabetes Neg Hx     BP 110/80 (BP Location: Right Arm, Patient Position: Sitting, Cuff Size: Normal)   Pulse 81   Ht 6' (1.829 m)   Wt 155 lb 6.4 oz (70.5 kg)  SpO2 97%   BMI 21.08 kg/m    Review of Systems Denies n/v.     Objective:   Physical Exam VITAL SIGNS:  See vs page GENERAL: no distress Pulses: dorsalis pedis intact bilat.   MSK: no deformity of the feet CV: no leg edema Skin:  no ulcer on the feet.  normal color and temp on the feet. Neuro: sensation is intact to touch on the feet.     A1c=13.1%    Assessment & Plan:  Type 1 DM: uncontrolled.  I told pt to take 10 units qd, no matter what the A1c is.  panc Ca++: in this setting, patients can be insulin-sensitive, but inability to tolerate 10 mg qd with this A1c raises question of med compliance.   Discordance between reported cbg's and A1c: ref CDE  Patient Instructions  Please see a diabetes education specialist.    check your blood sugar twice a day.  vary the time of day when you check, between before the 3 meals, and at bedtime.  also check if you have symptoms of your blood sugar being too high or too low.  please keep a record of the readings and bring it to your next appointment here (or you can bring the meter itself).  You can write it on any piece of paper.  please call us sooner if your blood sugar goes below 70, or if you have a lot of readings over 200.   Please come back for a follow-up appointment in 2 months.

## 2020-12-11 ENCOUNTER — Encounter: Payer: Medicare Other | Attending: Endocrinology | Admitting: Nutrition

## 2020-12-11 ENCOUNTER — Other Ambulatory Visit: Payer: Self-pay

## 2020-12-11 DIAGNOSIS — E119 Type 2 diabetes mellitus without complications: Secondary | ICD-10-CM | POA: Insufficient documentation

## 2020-12-11 DIAGNOSIS — Z794 Long term (current) use of insulin: Secondary | ICD-10-CM | POA: Diagnosis not present

## 2020-12-11 MED ORDER — FREESTYLE LIBRE 2 READER DEVI: 0 refills | Status: DC

## 2020-12-11 MED ORDER — FREESTYLE LIBRE 2 SENSOR MISC: 1.0000 | 3 refills | Status: DC

## 2020-12-11 NOTE — Progress Notes (Signed)
Pt. Reports taking his insulin every morning at 6AM.  He takes 10u.   Typical day: 6AM: Bfast: 1 egg, 2 bacon, 2 toast with margarine, water to drink 9:30-10 1 package of peanut butter crackers and diet drink 12:30PM: sandwich of meat and cheese, with small amount of chips, water or diet soda.  Occasional fruit of small banana, or apple 4-5PM: 1 package of peanut butter crackers  8 PM: supper 4-5 ounces chicken, pork, beef, with 2 servings of starch in the form of veg., like corn or potatoe, and 2 servings of green veg.  Water to drink Denies snacking after supper 11PM: sleep. Work is from [employer] to [employer]: some days active and some days not.   SBGM:  Does not like testing his blood sugar.  Says it hurts.  Testing this AM and it was 140.   HE was given a sample of Josephine Igo that connected to his phone.  Case will not allow for blue tooth connection.  Will order reader from mail order pharmacy with sensors.   Was not able to link his phone to the practice due to timing of sensor start.

## 2020-12-11 NOTE — Patient Instructions (Signed)
Press app on phone and scan sensor at least before meals and at bedtime Call when reader comes in for training.

## 2020-12-11 NOTE — Addendum Note (Signed)
Addended by: Kenyon Ana on: 12/11/2020 03:55 PM   Modules accepted: Orders

## 2020-12-23 ENCOUNTER — Other Ambulatory Visit: Payer: Self-pay

## 2020-12-23 ENCOUNTER — Ambulatory Visit (INDEPENDENT_AMBULATORY_CARE_PROVIDER_SITE_OTHER): Payer: Medicare Other | Admitting: Pharmacist

## 2020-12-23 DIAGNOSIS — E1165 Type 2 diabetes mellitus with hyperglycemia: Secondary | ICD-10-CM | POA: Diagnosis not present

## 2020-12-23 DIAGNOSIS — N1831 Chronic kidney disease, stage 3a: Secondary | ICD-10-CM

## 2020-12-23 DIAGNOSIS — E1122 Type 2 diabetes mellitus with diabetic chronic kidney disease: Secondary | ICD-10-CM

## 2020-12-23 DIAGNOSIS — Z794 Long term (current) use of insulin: Secondary | ICD-10-CM | POA: Diagnosis not present

## 2020-12-23 DIAGNOSIS — I10 Essential (primary) hypertension: Secondary | ICD-10-CM | POA: Diagnosis not present

## 2020-12-23 DIAGNOSIS — E785 Hyperlipidemia, unspecified: Secondary | ICD-10-CM

## 2020-12-23 DIAGNOSIS — IMO0002 Reserved for concepts with insufficient information to code with codable children: Secondary | ICD-10-CM

## 2020-12-23 DIAGNOSIS — I480 Paroxysmal atrial fibrillation: Secondary | ICD-10-CM | POA: Diagnosis not present

## 2020-12-23 DIAGNOSIS — E1169 Type 2 diabetes mellitus with other specified complication: Secondary | ICD-10-CM | POA: Diagnosis not present

## 2020-12-23 NOTE — Progress Notes (Signed)
Chronic Care Management Pharmacy Note  12/26/2020 Name:  Michael Snyder MRN:  [id number] DOB:  1951-02-08  Summary: -Pt reports he is taking HCTZ 12.5 mg, amlodipine 2.5 mg, Farxiga and KCL from Dr Royce Macadamia (nephrology) in addition to triamterene-HCTZ from PCP. However triamterene has not been filled since Dec 2021 so not clear if he is truly taking this. He has f/u with nephrology this month w/ labwork -Pt typically just takes what he gets from mail order  Recommendations/Changes made from today's visit: -Advised pt to hold triamterene-HCTZ if he is actually still taking it, continue other meds, and f/u with nephrology as scheduled   Subjective: Michael Snyder is an 70 y.o. year old male who is a primary patient of Hoyt Koch, MD.  The CCM team was consulted for assistance with disease management and care coordination needs.    Engaged with patient by telephone for follow up visit in response to provider referral for pharmacy case management and/or care coordination services.   Consent to Services:  The patient was given information about Chronic Care Management services, agreed to services, and gave verbal consent prior to initiation of services.  Please see initial visit note for detailed documentation.   Patient Care Team: Hoyt Koch, MD as PCP - General (Internal Medicine) Charlton Haws, [facility] as Pharmacist (Pharmacist)  Recent office visits: 07/29/20 Dr Sharlet Salina OV: acute visit for recent food poisoning, improving. Advised Tums/Rolaids.  07/18/20 Dr Sharlet Salina OV: acute visit for constipation. Rx'd Linzess.  06/17/20 Dr Sharlet Salina OV: chronic f/u. Pt reports urologist told him to stop amlodipine and actos- per PCP's review of note no evidence of this. Advised to stop glipizide when starting Lantus (per endocrine). K low, switched HCTZ to triamterene/HCTZ and advised to stop potassium.  Recent consult visits: 12/11/20 RD Leonia Reader (DM/nutrition): [facility] education.  11/26/20 Dr Loanne Drilling (endocrine): pt skips insulin several times per week d/t CBG in 100s.  09/23/20 Dr Loanne Drilling (endocrine): A1c improved to 9.5. Hypoglycemia. Changed Lantus to AM. Rx'd Freestyle Libre.  09/03/20 Dr Royce Macadamia (nephrology): pt should have stopped HCTZ, potassium last visit. K 3.5, GFR 47. Started Farxiga 10 mg. Started amlodipine 2.5 mg daily. (Believes pt to be off HCTZ, triamterene/HCTZ)  08/19/20 RD Antonieta Iba (CDE): DM/ nutrition education  08/02/20 Dr Loanne Drilling (endocrine): f/u DM. New hypoTH. Rx'd levothyroxine. Frucosamine 569 and A1c 10.4. Referred to CDE.  06/05/20 Dr Royce Macadamia (nephrology): stop HCTZ and chromium.  Hospital visits: None in previous 6 months 07/14/20 ED visit - [facility] for constipation. Disimpaction at bedside. Rec'd Miralax and hydration   Objective:  Lab Results  Component Value Date   CREATININE 1.63 (H) 07/14/2020   BUN 22 07/14/2020   GFR 42.35 (L) 06/17/2020   GFRNONAA 45 (L) 07/14/2020   GFRAA 54 (L) 11/29/2018   NA 136 07/14/2020   K 3.6 07/14/2020   CALCIUM 9.6 07/14/2020   CO2 30 07/14/2020   GLUCOSE 211 (H) 08/02/2020    Lab Results  Component Value Date/Time   HGBA1C 9.5 (A) 09/23/2020 09:02 AM   HGBA1C 10.4 (A) 08/02/2020 07:43 AM   HGBA1C 10.6 (H) 04/20/2019 08:55 AM   HGBA1C 8.8 (H) 05/02/2018 04:41 PM   FRUCTOSAMINE 569 (H) 08/02/2020 08:15 AM   FRUCTOSAMINE 442 (H) 02/01/2020 08:14 AM   GFR 42.35 (L) 06/17/2020 03:40 PM   GFR 44.76 (L) 11/27/2019 09:01 AM   MICROALBUR 0.9 06/17/2020 03:40 PM   MICROALBUR 5.0 (H) 04/20/2019 08:55 AM    Last  diabetic Eye exam:  Lab Results  Component Value Date/Time   HMDIABEYEEXA No Retinopathy 06/07/2017 12:00 AM    Last diabetic Foot exam: No results found for: HMDIABFOOTEX   Lab Results  Component Value Date   CHOL 187 04/20/2019   HDL 46.20 04/20/2019   LDLCALC 85 05/02/2018   LDLDIRECT 102.0 04/20/2019   TRIG 209.0 (H) 04/20/2019   CHOLHDL 4  04/20/2019    Hepatic Function Latest Ref Rng & Units 07/14/2020 06/17/2020 11/27/2019  Total Protein 6.5 - 8.1 g/dL 7.3 6.9 7.0  Albumin 3.5 - 5.0 g/dL 4.7 4.2 4.5  AST 15 - 41 U/L 39 26 55(H)  ALT 0 - 44 U/L 30 19 37  Alk Phosphatase 38 - 126 U/L 72 78 80  Total Bilirubin 0.3 - 1.2 mg/dL 1.1 0.7 0.7  Bilirubin, Direct 0.01 - 0.4 - - -    Lab Results  Component Value Date/Time   TSH 4.65 (H) 08/02/2020 08:11 AM   TSH 2.36 06/10/2017 03:09 PM    CBC Latest Ref Rng & Units 07/14/2020 06/17/2020 11/27/2019  WBC 4.0 - 10.5 K/uL 9.7 6.7 6.1  Hemoglobin 13.0 - 17.0 g/dL 13.5 12.7(L) 12.6(L)  Hematocrit 39.0 - 52.0 % 39.8 38.4(L) 37.1(L)  Platelets 150 - 400 K/uL 171 135.0(L) 140.0(L)    No results found for: VD25OH  Clinical ASCVD: No  The 10-year ASCVD risk score Mikey Bussing DC Jr., et al., 2013) is: 26.8%   Values used to calculate the score:     Age: 89 years     Sex: Male     Is Non-Hispanic African American: Yes     Diabetic: Yes     Tobacco smoker: No     Systolic Blood Pressure: 810 mmHg     Is BP treated: Yes     HDL Cholesterol: 46.2 mg/dL     Total Cholesterol: 187 mg/dL    Depression screen [facility] 2/9 08/19/2020 02/20/2020 09/28/2019  Decreased Interest 0 0 0  Down, Depressed, Hopeless 0 0 0  PHQ - 2 Score 0 0 0  Some recent data might be hidden    CHA2DS2-VASc Score = 3  The patient's score is based upon: CHF History: No HTN History: Yes Diabetes History: Yes Stroke History: No Vascular Disease History: No Age Score: 1 Gender Score: 0      Social History   Tobacco Use  Smoking Status Never  Smokeless Tobacco Never   BP Readings from Last 3 Encounters:  11/26/20 110/80  09/23/20 140/84  08/02/20 118/80   Pulse Readings from Last 3 Encounters:  11/26/20 81  09/23/20 (!) 52  08/02/20 83   Wt Readings from Last 3 Encounters:  11/26/20 155 lb 6.4 oz (70.5 kg)  09/23/20 169 lb 6.4 oz (76.8 kg)  08/19/20 161 lb (73 kg)   BMI Readings from Last 3  Encounters:  11/26/20 21.08 kg/m  09/23/20 22.97 kg/m  08/19/20 21.84 kg/m    Assessment/Interventions: Review of patient past medical history, allergies, medications, health status, including review of consultants reports, laboratory and other test data, was performed as part of comprehensive evaluation and provision of chronic care management services.   SDOH:  (Social Determinants of Health) assessments and interventions performed: Yes  SDOH Screenings   Alcohol Screen: Low Risk    Last Alcohol Screening Score (AUDIT): 0  Depression (PHQ2-9): Low Risk    PHQ-2 Score: 0  Financial Resource Strain: Low Risk    Difficulty of Paying Living Expenses: Not hard at all  Food Insecurity: No Food Insecurity   Worried About Charity fundraiser in the Last Year: Never true   Ran Out of Food in the Last Year: Never true  Housing: Low Risk    Last Housing Risk Score: 0  Physical Activity: Sufficiently Active   Days of Exercise per Week: 7 days   Minutes of Exercise per Session: 150+ min  Social Connections: Moderately Integrated   Frequency of Communication with Friends and Family: More than three times a week   Frequency of Social Gatherings with Friends and Family: Once a week   Attends Religious Services: 1 to 4 times per year   Active Member of [employer] or Organizations: Yes   Attends Archivist Meetings: 1 to 4 times per year   Marital Status: Divorced  Stress: No Stress Concern Present   Feeling of Stress : Not at all  Tobacco Use: Low Risk    Smoking Tobacco Use: Never   Smokeless Tobacco Use: Never  Transportation Needs: No Transportation Needs   Lack of Transportation (Medical): No   Lack of Transportation (Non-Medical): No    CCM Care Plan  Allergies  Allergen Reactions   Clindamycin/Lincomycin Swelling    Possible facial and lip swelling   Ace Inhibitors     Facial and lip swelling possibly associated    Medications Reviewed Today     Reviewed by  Charlton Haws, [facility] (Pharmacist) on 12/23/20 at 1438  Med List Status: <None>   Medication Order Taking? Sig Documenting Provider Last Dose Status Informant  amLODipine (NORVASC) 2.5 MG tablet [id number] Yes Take 2.5 mg by mouth daily. Claudia Desanctis, MD Taking Active   aspirin EC 81 MG tablet [id number] Yes Take 81 mg daily by mouth. [provider] Taking Active   atorvastatin (LIPITOR) 20 MG tablet [id number] Yes Take 1 tablet (20 mg total) by mouth daily. Hoyt Koch, MD Taking Active   Blood Pressure Monitoring (BLOOD PRESSURE KIT) DEVI [id number] Yes 1 kit by Does not apply route daily. Hoyt Koch, MD Taking Active   brimonidine [facility]) 0.2 % ophthalmic solution [id number] Yes SMARTSIG:In Eye(s) [provider] Taking Active   Calcium Carbonate-Vitamin D (CALTRATE 600+D) 600-400 MG-UNIT per tablet [id number] Yes Take 1 tablet by mouth 2 (two) times daily. Norins, Heinz Knuckles, MD Taking Active   Cholecalciferol (VITAMIN D) 50 MCG (2000 UT) tablet [id number] Yes Take 2,000 Units by mouth daily. [provider] Taking Active   Continuous Blood Gluc Receiver (FREESTYLE LIBRE 2 READER) DEVI [id number] Yes Use as directed to check blood sugar Renato Shin, MD Taking Active   Continuous Blood Gluc Sensor (FREESTYLE LIBRE 2 SENSOR) [place] [id number] Yes 1 Device by Does not apply route every 14 (fourteen) days. Renato Shin, MD Taking Active   dapagliflozin propanediol (FARXIGA) 10 MG TABS tablet [id number] Yes Take 10 mg by mouth daily. Claudia Desanctis, MD  Active   hydrochlorothiazide (MICROZIDE) 12.5 MG capsule [id number] Yes Take 12.5 mg by mouth daily. Claudia Desanctis, MD Taking Active   insulin glargine (LANTUS SOLOSTAR) 100 UNIT/ML Solostar Pen [id number] Yes Inject 10 Units into the skin every morning. And pen needles 1/day Renato Shin, MD Taking Active   Lancets [facility] Donaciano Eva PLUS [id number]) [place] [id number] Yes CHECK BLOOD SUGAR TWICE  DAILY  Hoyt Koch, MD Taking Active   latanoprost Ivin Poot) 0.005 % ophthalmic solution [id number] Yes SMARTSIG:In Eye(s) [provider] Taking Active   levothyroxine (SYNTHROID) 25 MCG tablet [id number] Yes Take  1 tablet (25 mcg total) by mouth daily before breakfast. Renato Shin, MD Taking Active   linaclotide [facility]) 145 MCG CAPS capsule [id number] Yes Take 1 capsule (145 mcg total) by mouth daily before breakfast. Hoyt Koch, MD Taking Active   Multiple Vitamin (MULTIVITAMIN WITH MINERALS) TABS tablet [id number] Yes Take 1 tablet by mouth daily. [provider] Taking Active Self  Omega-3 Fatty Acids (FISH OIL) 300 MG CAPS [id number] Yes Take 1 capsule (300 mg total) by mouth daily. Norins, Heinz Knuckles, MD Taking Active Self  Roma Schanz test strip [id number] Yes TEST TWICE DAILY Hoyt Koch, MD Taking Active   pioglitazone (ACTOS) 30 MG tablet [id number] Yes Take 1 tablet (30 mg total) by mouth daily. Renato Shin, MD Taking Active   polyethylene glycol powder [facility]) 17 GM/SCOOP powder [id number] Yes Take 17 g by mouth 2 (two) times daily as needed. Carlisle Cater, PA-C Taking Active   potassium chloride (KLOR-CON) 10 MEQ tablet [id number] Yes Take 10 mEq by mouth 2 (two) times daily. Claudia Desanctis, MD Taking Active   RYBELSUS 14 MG TABS [id number] Yes TAKE 1 TABLET BY MOUTH  DAILY Renato Shin, MD Taking Active   triamcinolone ointment (KENALOG) 0.5 % [id number] Yes APPLY TOPICALLY TWICE DAILY Hoyt Koch, MD Taking Active             Patient Active Problem List   Diagnosis Date Noted   Hypothyroidism 08/03/2020   Constipation 07/18/2020   De Quervain's tenosynovitis, left 03/14/2019   Left wrist pain 09/23/2018   Hepatitis C virus infection cured after antiviral drug therapy 12/24/2015   Cataract, right eye 11/13/2014   Cataract, left eye 68/05/5725   Complications, pacemaker cardiac, 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/2014   Atrial  fibrillation ([place]) 11/17/2012   Cardiac pacemaker MDT 11/26/2011   Routine health maintenance 06/16/2011   Essential hypertension 06/01/2008   Diabetes mellitus type 2, controlled, without complications ([place]) 20/35/5974   SICK SINUS SYNDROME 03/26/2007   SEIZURE DISORDER 03/26/2007    Immunization History  Administered Date(s) Administered   Fluad Quad(high Dose 65+) 04/20/2019, 07/29/2020   Influenza Whole 03/30/2008, 04/16/2009, 06/05/2010   Influenza, High Dose Seasonal PF 06/02/2016, 04/05/2017, 03/29/2018   Influenza,inj,Quad PF,6+ Mos 05/16/2013, 03/08/2014, 08/21/2015   PFIZER(Purple Top)SARS-COV-2 Vaccination 10/27/2019, 11/17/2019, 05/28/2020   Pneumococcal Conjugate-13 11/01/2014   Pneumococcal Polysaccharide-23 09/13/2012, 12/30/2017   Td 03/30/2008   Tdap 05/02/2018   Zoster Recombinat (Shingrix) 12/30/2017   Zoster, Live 07/20/2014    Conditions to be addressed/monitored:  Hypertension, Hyperlipidemia, Diabetes, Atrial Fibrillation, and Chronic Kidney Disease  Care Plan : [place]  Updates made by Charlton Haws, [place] since 12/26/2020 12:00 AM     Problem: Hypertension, Hyperlipidemia, Diabetes, Atrial Fibrillation, and Chronic Kidney Disease   Priority: High     Long-Range Goal: Disease management   Start Date: 12/26/2020  Expected End Date: 06/27/2021  This Visit's Progress: On track  Priority: High  Note:   Current Barriers:  Unable to independently monitor therapeutic efficacy Confusion over which medications to take with multiple prescriber and pharmacies  Pharmacist Clinical Goal(s):  Patient will achieve adherence to monitoring guidelines and medication adherence to achieve therapeutic efficacy achieve ability to self administer medications as prescribed through use of CCM as evidenced by patient report through collaboration with PharmD and provider.   Interventions: 1:1 collaboration with Hoyt Koch, MD regarding  development and update of comprehensive plan of care as evidenced by provider attestation and co-signature Inter-disciplinary care team collaboration (  see longitudinal plan of care) Comprehensive medication review performed; medication list updated in electronic medical record   AFIB / Hypertension / CKD    Hx sick sinus syndrome s/p PPM  BP goal is:  <130/80 Patient checks BP at home daily Patient home BP readings are ranging: 120/80s   Patient has failed these meds in past: HCTZ (hypoK), amlodipine (swelling) Patient is currently controlled on the following medications: Triamterene-HCTZ 37.5-25 mg daily Amlodipine 2.5 mg daily (Dr Royce Macadamia) HCTZ 12.5 mg daily (Dr Royce Macadamia) Potassium ER 10 mEq BID (Dr Royce Macadamia) Aspirin 81 mg daily   We discussed:  pt is not entirely sure which BP meds he should be taking. Per last visit with nephrology, he should be on amlodipine only plus potassium supplement (HCTZ was dc'd in Dec 2021). Pt thinks he is also taking HCTZ 12.5 mg and triamterene-HCTZ - however per [employer] triamterene has not been filled since 06/18/20 at [facility].  It is likely pt is just taking amlodipine, HCTZ 12.5 mg and potassium. Advised he hold triamterene and HCTZ (if he is indeed still taking) until he can f/u with nephrology.  It appears pt has been taking HCTZ since at least January 2022 along with potassium 10 mEq since March 2022 (both have consistent refills per mail order) Pt is planning to get labwork done with nephrology in the coming weeks. It is safest to continue same regimen until BMP can be rechecked - stopping HCTZ or KCL at this point may lead to potassium issues.   Plan: Hold triamterene/hctz. Continue amlodipine 2.5 mg, HCTZ 12.5 mg and Potassium until f/u with nephrology.   Diabetes    A1c goal <8% Checking BG: Daily Recent FBG Readings: 126    Patient has failed these meds in past: glipizide Patient is currently uncontrolled on the following  medications: Lantus 10 units qAM  Rybelsus 14 mg daily Pioglitazone 30 mg daily Farxiga 10 mg daily (Dr Royce Macadamia) Aretta Nip Libre 2   We discussed: pt confirms compliance with above regimen; previously endocrinologist has questioned his insulin compliance -today pt denies missed/skipped doses; his Freestyle sensor malfunctioned and he is waiting to receive a replacement, currently checking BG once a day in AM. Wilder Glade was added right before most recent A1c check   Plan: Continue current medications  Hyperlipidemia    LDL goal < 100  Patient has failed these meds in past: n/a Patient is currently controlled on the following medications: Atorvastatin 20 mg daily Omega-3 Fish oil 300 mg daily   We discussed: pt endorses compliance and denies issues; LDL is near goal but has not been checked since 04/2019   Plan: Continue current medications; check lipid panel this year  Constipation    Patient has failed these meds in past: magnesium citrate;  Patient is currently uncontrolled on the following medications: Linzess 145 mcg daily   We discussed:  Pt reports Linzess has helped with constipation; he reports he does not take it every day; discussed importance of hydration to prevent constipation   Plan: Recommend to continue current medication    Patient Goals/Self-Care Activities Patient will:  - take medications as prescribed focus on medication adherence by pill box check glucose twice a day, document, and provide at future appointments check blood pressure twice a week, document, and provide at future appointments -Stop triamterence-HCTZ if you are still taking it. Discuss Blood pressure medications with Dr Royce Macadamia at next f/u.     Medication Assistance: None required.  Patient affirms current coverage meets needs.  Compliance/Adherence/Medication fill history: Care Gaps: Shingrix (2nd dose - due 02/24/18) Covid booster (due 09/25/20) Eye exam (due 06/07/18)  Star-Rating  Drugs: Atorvastatin - LF 10/16/20 x 90 ds Farxiga - LF 09/24/20 x 90 ds Pioglitazone - LF 11/19/20 x 90 ds Rybelsus - LF 11/21/20 x 90 ds  Patient's preferred pharmacy is:  [employer]  ([place] [place], [address], [address] [place], [place] [address] Phone: [phone] Fax: [phone]  [facility] ([address]), [place] [address] [place] ([place]) [place] [address] Phone: [phone] Fax: [phone]  Uses pill box? Yes Pt endorses 100% compliance  We discussed: Current pharmacy is preferred with insurance plan and patient is satisfied with pharmacy services Patient decided to: Continue current medication management strategy  Care Plan and Follow Up Patient Decision:  Patient agrees to Care Plan and Follow-up.  Plan: Telephone follow up appointment with care management team member scheduled for:  3 months  Charlene Brooke, PharmD, [place], CPP Clinical Pharmacist [facility] [phone]

## 2020-12-26 NOTE — Patient Instructions (Signed)
Visit Information  Phone number for Pharmacist: [phone]   Goals Addressed             This Visit's Progress    Manage My Medicine       Timeframe:  Long-Range Goal Priority:  High Start Date:    12/26/20                         Expected End Date:    06/27/21                   Follow Up Date: Sept 2022   - call for medicine refill 2 or 3 days before it runs out - call if I am sick and can't take my medicine - keep a list of all the medicines I take; vitamins and herbals too - use a pillbox to sort medicine  -Stop triamterence-HCTZ if you are still taking it. Discuss Blood pressure medications with Dr Malen Gauze at next f/u.   Why is this important?   These steps will help you keep on track with your medicines.   Notes:          Patient verbalizes understanding of instructions provided today and agrees to view in MyChart.  Telephone follow up appointment with pharmacy team member scheduled for: 3 months  Al Corpus, PharmD, [place], CPP Clinical Pharmacist [facility] [phone]

## 2020-12-30 DIAGNOSIS — N1831 Chronic kidney disease, stage 3a: Secondary | ICD-10-CM | POA: Diagnosis not present

## 2020-12-30 DIAGNOSIS — E559 Vitamin D deficiency, unspecified: Secondary | ICD-10-CM | POA: Diagnosis not present

## 2021-01-07 DIAGNOSIS — N200 Calculus of kidney: Secondary | ICD-10-CM | POA: Diagnosis not present

## 2021-01-07 DIAGNOSIS — I129 Hypertensive chronic kidney disease with stage 1 through stage 4 chronic kidney disease, or unspecified chronic kidney disease: Secondary | ICD-10-CM | POA: Diagnosis not present

## 2021-01-07 DIAGNOSIS — I495 Sick sinus syndrome: Secondary | ICD-10-CM | POA: Diagnosis not present

## 2021-01-07 DIAGNOSIS — N1832 Chronic kidney disease, stage 3b: Secondary | ICD-10-CM | POA: Diagnosis not present

## 2021-01-07 DIAGNOSIS — E1122 Type 2 diabetes mellitus with diabetic chronic kidney disease: Secondary | ICD-10-CM | POA: Diagnosis not present

## 2021-01-07 DIAGNOSIS — E559 Vitamin D deficiency, unspecified: Secondary | ICD-10-CM | POA: Diagnosis not present

## 2021-01-07 DIAGNOSIS — N281 Cyst of kidney, acquired: Secondary | ICD-10-CM | POA: Diagnosis not present

## 2021-01-07 DIAGNOSIS — E876 Hypokalemia: Secondary | ICD-10-CM | POA: Diagnosis not present

## 2021-01-09 DIAGNOSIS — R634 Abnormal weight loss: Secondary | ICD-10-CM | POA: Diagnosis not present

## 2021-01-09 DIAGNOSIS — Z1211 Encounter for screening for malignant neoplasm of colon: Secondary | ICD-10-CM | POA: Diagnosis not present

## 2021-01-09 DIAGNOSIS — Z86010 Personal history of colonic polyps: Secondary | ICD-10-CM | POA: Diagnosis not present

## 2021-01-29 ENCOUNTER — Ambulatory Visit: Payer: Medicare Other | Admitting: Endocrinology

## 2021-01-31 ENCOUNTER — Ambulatory Visit (INDEPENDENT_AMBULATORY_CARE_PROVIDER_SITE_OTHER): Payer: Medicare Other

## 2021-01-31 DIAGNOSIS — I495 Sick sinus syndrome: Secondary | ICD-10-CM | POA: Diagnosis not present

## 2021-02-01 LAB — CUP PACEART REMOTE DEVICE CHECK
Battery Impedance: 378 Ohm
Battery Remaining Longevity: 111 mo
Battery Voltage: 2.79 V
Brady Statistic AP VP Percent: 0 %
Brady Statistic AP VS Percent: 8 %
Brady Statistic AS VP Percent: 1 %
Brady Statistic AS VS Percent: 91 %
Date Time Interrogation Session: 20220729200735
Implantable Lead Implant Date: 20080711
Implantable Lead Implant Date: 20150918
Implantable Lead Location: 753859
Implantable Lead Location: 753860
Implantable Lead Model: 5076
Implantable Lead Model: 5076
Implantable Pulse Generator Implant Date: 20150918
Lead Channel Impedance Value: 472 Ohm
Lead Channel Impedance Value: 508 Ohm
Lead Channel Pacing Threshold Amplitude: 0.875 V
Lead Channel Pacing Threshold Amplitude: 0.875 V
Lead Channel Pacing Threshold Pulse Width: 0.4 ms
Lead Channel Pacing Threshold Pulse Width: 0.4 ms
Lead Channel Setting Pacing Amplitude: 2 V
Lead Channel Setting Pacing Amplitude: 2.5 V
Lead Channel Setting Pacing Pulse Width: 0.4 ms
Lead Channel Setting Sensing Sensitivity: 2 mV

## 2021-02-06 ENCOUNTER — Telehealth: Payer: Self-pay | Admitting: Pharmacist

## 2021-02-06 NOTE — Progress Notes (Signed)
Chronic Care Management Pharmacy Assistant   Name: Michael Snyder  MRN: [id number] DOB: 1950/08/19    Reason for Encounter: Disease State   Conditions to be addressed/monitored: DMII   Recent office visits:  None ID  Recent consult visits:  None ID  Hospital visits:  None in previous 6 months  Medications: Outpatient Encounter Medications as of 02/06/2021  Medication Sig   amLODipine (NORVASC) 2.5 MG tablet Take 2.5 mg by mouth daily.   aspirin EC 81 MG tablet Take 81 mg daily by mouth.   atorvastatin (LIPITOR) 20 MG tablet Take 1 tablet (20 mg total) by mouth daily.   Blood Pressure Monitoring (BLOOD PRESSURE KIT) DEVI 1 kit by Does not apply route daily.   brimonidine (ALPHAGAN) 0.2 % ophthalmic solution SMARTSIG:In Eye(s)   Calcium Carbonate-Vitamin D (CALTRATE 600+D) 600-400 MG-UNIT per tablet Take 1 tablet by mouth 2 (two) times daily.   Cholecalciferol (VITAMIN D) 50 MCG (2000 UT) tablet Take 2,000 Units by mouth daily.   Continuous Blood Gluc Receiver (FREESTYLE LIBRE 2 READER) DEVI Use as directed to check blood sugar   Continuous Blood Gluc Sensor (FREESTYLE LIBRE 2 SENSOR) MISC 1 Device by Does not apply route every 14 (fourteen) days.   dapagliflozin propanediol (FARXIGA) 10 MG TABS tablet Take 10 mg by mouth daily.   hydrochlorothiazide (MICROZIDE) 12.5 MG capsule Take 12.5 mg by mouth daily.   insulin glargine (LANTUS SOLOSTAR) 100 UNIT/ML Solostar Pen Inject 10 Units into the skin every morning. And pen needles 1/day   Lancets (ONETOUCH DELICA PLUS LANCET33G) MISC CHECK BLOOD SUGAR TWICE  DAILY   latanoprost (XALATAN) 0.005 % ophthalmic solution SMARTSIG:In Eye(s)   levothyroxine (SYNTHROID) 25 MCG tablet Take 1 tablet (25 mcg total) by mouth daily before breakfast.   linaclotide (LINZESS) 145 MCG CAPS capsule Take 1 capsule (145 mcg total) by mouth daily before breakfast.   Multiple Vitamin (MULTIVITAMIN WITH MINERALS) TABS tablet Take 1 tablet by mouth daily.    Omega-3 Fatty Acids (FISH OIL) 300 MG CAPS Take 1 capsule (300 mg total) by mouth daily.   ONETOUCH VERIO test strip TEST TWICE DAILY   pioglitazone (ACTOS) 30 MG tablet Take 1 tablet (30 mg total) by mouth daily.   polyethylene glycol powder (GLYCOLAX/MIRALAX) 17 GM/SCOOP powder Take 17 g by mouth 2 (two) times daily as needed.   potassium chloride (KLOR-CON) 10 MEQ tablet Take 10 mEq by mouth 2 (two) times daily.   RYBELSUS 14 MG TABS TAKE 1 TABLET BY MOUTH  DAILY   triamcinolone ointment (KENALOG) 0.5 % APPLY TOPICALLY TWICE DAILY   No facility-administered encounter medications on file as of 02/06/2021.    Pharmacist Review  Recent Relevant Labs: Lab Results  Component Value Date/Time   HGBA1C 9.5 (A) 09/23/2020 09:02 AM   HGBA1C 10.4 (A) 08/02/2020 07:43 AM   HGBA1C 10.6 (H) 04/20/2019 08:55 AM   HGBA1C 8.8 (H) 05/02/2018 04:41 PM   MICROALBUR 0.9 06/17/2020 03:40 PM   MICROALBUR 5.0 (H) 04/20/2019 08:55 AM    Kidney Function Lab Results  Component Value Date/Time   CREATININE 1.63 (H) 07/14/2020 06:23 PM   CREATININE 1.63 (H) 06/17/2020 03:40 PM   CREATININE 1.63 (H) 05/02/2015 11:06 AM   GFR 42.35 (L) 06/17/2020 03:40 PM   GFRNONAA 45 (L) 07/14/2020 06:23 PM   GFRAA 54 (L) 11/29/2018 03:11 PM    Current antihyperglycemic regimen:  Lantus 10 units qAM Rybelsus 14 mg daily(patient states he is not please with medication does not  believe it is helping) Pioglitazone 30 mg daily Farxiga 10 mg daily (Dr Royce Macadamia) [facility] 2 (patient states that he needs a sensor)  What recent interventions/DTPs have been made to improve glycemic control:  Continue current medications-CPP 12/23/20 Please see a diabetes education specialist.  check your blood sugar twice a day. vary the time of day when you check, between before the 3 meals, and at bedtime. also check if you have symptoms of your blood sugar being too high or too low.Dr. Loanne Drilling 11/26/20  Have there been any recent  hospitalizations or ED visits since last visit with [facility]? No  Patient denies hypoglycemic symptoms, including None  Patient reports that his levels have been around 200 and once he had a reading of 236. He states he has not had any hyperglycemic symptoms,   How often are you checking your blood sugar? once daily  What are your blood sugars ranging? 130-230 Fasting: 136 this morning Before meals: NA After meals: NA Bedtime: NA  During the week, how often does your blood glucose drop below 70? Never  Are you checking your feet daily/regularly? Patient states that he does not have any issues with feet at this time  Adherence Review: Is the patient currently on a STATIN medication? Yes Is the patient currently on ACE/ARB medication? No Does the patient have >5 day gap between last estimated fill dates? No   Star Rating Drugs: Atorvastatin 20 mg-last fill 01/15/21 90 ds Farxiga 10 mg-last fill 01/13/21 90 ds Pioglitazone 30 mg-last fill 11/09/20 90 ds  Ethelene Hal Clinical Pharmacist Assistant [phone]   Time spent:40

## 2021-02-26 NOTE — Progress Notes (Signed)
Remote pacemaker transmission.   

## 2021-02-27 ENCOUNTER — Telehealth: Payer: Self-pay | Admitting: Internal Medicine

## 2021-02-27 NOTE — Telephone Encounter (Signed)
Type of form received: Physical fitness   Received by: [place]   Form should be Faxed to: n/a  Form should be mailed to: n/a  Is patient requesting call for pickup: Y    Form placed in the Provider's box.  Charge sheet will be attached  Was patient informed of  7-10 business day turn around (Y/N)? n

## 2021-02-28 NOTE — Telephone Encounter (Signed)
Form placed in box for signature. Last office visit was 07/29/2020. Does he need a current office visit? Please advise

## 2021-03-02 ENCOUNTER — Encounter: Payer: Self-pay | Admitting: Internal Medicine

## 2021-03-03 NOTE — Telephone Encounter (Signed)
See my chart message

## 2021-03-03 NOTE — Telephone Encounter (Signed)
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 He also has not had a physical within the last year. He can pick up form and go to an urgent care or other location which does DOT physicals. I would also recommend to schedule a physical with [place] to make sure his health is up to date.

## 2021-03-12 ENCOUNTER — Other Ambulatory Visit: Payer: Self-pay | Admitting: Internal Medicine

## 2021-03-13 ENCOUNTER — Other Ambulatory Visit: Payer: Self-pay

## 2021-03-13 ENCOUNTER — Ambulatory Visit (INDEPENDENT_AMBULATORY_CARE_PROVIDER_SITE_OTHER): Payer: Medicare Other | Admitting: Internal Medicine

## 2021-03-13 ENCOUNTER — Encounter: Payer: Self-pay | Admitting: Internal Medicine

## 2021-03-13 ENCOUNTER — Ambulatory Visit (INDEPENDENT_AMBULATORY_CARE_PROVIDER_SITE_OTHER): Payer: Medicare Other

## 2021-03-13 VITALS — BP 110/70 | HR 91 | Temp 98.2°F | Ht 72.0 in | Wt 157.0 lb

## 2021-03-13 VITALS — BP 110/70 | HR 91 | Temp 98.2°F | Resp 18 | Ht 72.0 in | Wt 157.0 lb

## 2021-03-13 DIAGNOSIS — K5900 Constipation, unspecified: Secondary | ICD-10-CM | POA: Diagnosis not present

## 2021-03-13 DIAGNOSIS — I10 Essential (primary) hypertension: Secondary | ICD-10-CM | POA: Diagnosis not present

## 2021-03-13 DIAGNOSIS — Z0000 Encounter for general adult medical examination without abnormal findings: Secondary | ICD-10-CM | POA: Diagnosis not present

## 2021-03-13 DIAGNOSIS — Z23 Encounter for immunization: Secondary | ICD-10-CM | POA: Diagnosis not present

## 2021-03-13 DIAGNOSIS — E118 Type 2 diabetes mellitus with unspecified complications: Secondary | ICD-10-CM

## 2021-03-13 LAB — LIPID PANEL
Cholesterol: 119 mg/dL (ref 0–200)
HDL: 41.3 mg/dL (ref >39.00)
LDL Cholesterol: 57 mg/dL (ref 0–99)
NonHDL: 77.94
Total CHOL/HDL Ratio: 3
Triglycerides: 107 mg/dL (ref 0.0–149.0)
VLDL: 21.4 mg/dL (ref 0.0–40.0)

## 2021-03-13 LAB — COMPREHENSIVE METABOLIC PANEL
ALT: 29 U/L (ref 0–53)
AST: 34 U/L (ref 0–37)
Albumin: 4 g/dL (ref 3.5–5.2)
Alkaline Phosphatase: 77 U/L (ref 39–117)
BUN: 19 mg/dL (ref 6–23)
CO2: 24 mEq/L (ref 19–32)
Calcium: 9.1 mg/dL (ref 8.4–10.5)
Chloride: 106 mEq/L (ref 96–112)
Creatinine, Ser: 1.76 mg/dL — ABNORMAL HIGH (ref 0.40–1.50)
GFR: 38.42 mL/min — ABNORMAL LOW (ref >60.00)
Glucose, Bld: 187 mg/dL — ABNORMAL HIGH (ref 70–99)
Potassium: 3.3 mEq/L — ABNORMAL LOW (ref 3.5–5.1)
Sodium: 138 mEq/L (ref 135–145)
Total Bilirubin: 0.8 mg/dL (ref 0.2–1.2)
Total Protein: 6.7 g/dL (ref 6.0–8.3)

## 2021-03-13 LAB — CBC
HCT: 38.5 % — ABNORMAL LOW (ref 39.0–52.0)
Hemoglobin: 12.6 g/dL — ABNORMAL LOW (ref 13.0–17.0)
MCHC: 32.8 g/dL (ref 30.0–36.0)
MCV: 88.2 fl (ref 78.0–100.0)
Platelets: 147 10*3/uL — ABNORMAL LOW (ref 150.0–400.0)
RBC: 4.37 Mil/uL (ref 4.22–5.81)
RDW: 13.2 % (ref 11.5–15.5)
WBC: 6 10*3/uL (ref 4.0–10.5)

## 2021-03-13 LAB — HEMOGLOBIN A1C: Hgb A1c MFr Bld: 12.5 % — ABNORMAL HIGH (ref 4.6–6.5)

## 2021-03-13 LAB — MICROALBUMIN / CREATININE URINE RATIO
Creatinine,U: 71.8 mg/dL
Microalb Creat Ratio: 2.9 mg/g (ref 0.0–30.0)
Microalb, Ur: 2.1 mg/dL — ABNORMAL HIGH (ref 0.0–1.9)

## 2021-03-13 NOTE — Patient Instructions (Signed)
Michael Snyder , Thank you for taking time to come for your Medicare Wellness Visit. I appreciate your ongoing commitment to your health goals. Please review the following plan we discussed and let me know if I can assist you in the future.   Screening recommendations/referrals: Colonoscopy: last done 12/15/2017; due every 5 years  Recommended yearly ophthalmology/optometry visit for glaucoma screening and checkup Recommended yearly dental visit for hygiene and checkup  Vaccinations: Influenza vaccine: 07/29/2020; due Fall 2022 Pneumococcal vaccine: 11/01/2014, 12/30/2017 Tdap vaccine: 05/02/2018; due every 10 years Shingles vaccine: 12/30/2017; need second dose   Covid-19: 10/27/2019, 11/17/2019, 05/28/2020  Advanced directives: Please bring a copy of your health care power of attorney and living will to the office at your convenience.  Conditions/risks identified: Yes; Client understands the importance of follow-up with providers by attending scheduled visits and discussed goals to eat healthier, increase physical activity, exercise the brain, socialize more, get enough sleep and make time for laughter.  Next appointment: Please schedule your next Medicare Wellness Visit with your Nurse Health Advisor in 1 year by calling [phone].  Preventive Care 14 Years and Older, Male Preventive care refers to lifestyle choices and visits with your health care provider that can promote health and wellness. What does preventive care include? A yearly physical exam. This is also called an annual well check. Dental exams once or twice a year. Routine eye exams. Ask your health care provider how often you should have your eyes checked. Personal lifestyle choices, including: Daily care of your teeth and gums. Regular physical activity. Eating a healthy diet. Avoiding tobacco and drug use. Limiting alcohol use. Practicing safe sex. Taking low doses of aspirin every day. Taking vitamin and mineral  supplements as recommended by your health care provider. What happens during an annual well check? The services and screenings done by your health care provider during your annual well check will depend on your age, overall health, lifestyle risk factors, and family history of disease. Counseling  Your health care provider may ask you questions about your: Alcohol use. Tobacco use. Drug use. Emotional well-being. Home and relationship well-being. Sexual activity. Eating habits. History of falls. Memory and ability to understand (cognition). Work and work Astronomer. Screening  You may have the following tests or measurements: Height, weight, and BMI. Blood pressure. Lipid and cholesterol levels. These may be checked every 5 years, or more frequently if you are over 62 years old. Skin check. Lung cancer screening. You may have this screening every year starting at age 79 if you have a 30-pack-year history of smoking and currently smoke or have quit within the past 15 years. Fecal occult blood test (FOBT) of the stool. You may have this test every year starting at age 34. Flexible sigmoidoscopy or colonoscopy. You may have a sigmoidoscopy every 5 years or a colonoscopy every 10 years starting at age 77. Prostate cancer screening. Recommendations will vary depending on your family history and other risks. Hepatitis C blood test. Hepatitis B blood test. Sexually transmitted disease (STD) testing. Diabetes screening. This is done by checking your blood sugar (glucose) after you have not eaten for a while (fasting). You may have this done every 1-3 years. Abdominal aortic aneurysm (AAA) screening. You may need this if you are a current or former smoker. Osteoporosis. You may be screened starting at age 78 if you are at high risk. Talk with your health care provider about your test results, treatment options, and if necessary, the need for more tests.  Vaccines  Your health care provider  may recommend certain vaccines, such as: Influenza vaccine. This is recommended every year. Tetanus, diphtheria, and acellular pertussis (Tdap, Td) vaccine. You may need a Td booster every 10 years. Zoster vaccine. You may need this after age 44. Pneumococcal 13-valent conjugate (PCV13) vaccine. One dose is recommended after age 28. Pneumococcal polysaccharide (PPSV23) vaccine. One dose is recommended after age 44. Talk to your health care provider about which screenings and vaccines you need and how often you need them. This information is not intended to replace advice given to you by your health care provider. Make sure you discuss any questions you have with your health care provider. Document Released: 07/19/2015 Document Revised: 03/11/2016 Document Reviewed: 04/23/2015 Elsevier Interactive Patient Education  2017 [employer] Prevention in the Home Falls can cause injuries. They can happen to people of all ages. There are many things you can do to make your home safe and to help prevent falls. What can I do on the outside of my home? Regularly fix the edges of walkways and driveways and fix any cracks. Remove anything that might make you trip as you walk through a door, such as a raised step or threshold. Trim any bushes or trees on the path to your home. Use bright outdoor lighting. Clear any walking paths of anything that might make someone trip, such as rocks or tools. Regularly check to see if handrails are loose or broken. Make sure that both sides of any steps have handrails. Any raised decks and porches should have guardrails on the edges. Have any leaves, snow, or ice cleared regularly. Use sand or salt on walking paths during winter. Clean up any spills in your garage right away. This includes oil or grease spills. What can I do in the bathroom? Use night lights. Install grab bars by the toilet and in the tub and shower. Do not use towel bars as grab bars. Use  non-skid mats or decals in the tub or shower. If you need to sit down in the shower, use a plastic, non-slip stool. Keep the floor dry. Clean up any water that spills on the floor as soon as it happens. Remove soap buildup in the tub or shower regularly. Attach bath mats securely with double-sided non-slip rug tape. Do not have throw rugs and other things on the floor that can make you trip. What can I do in the bedroom? Use night lights. Make sure that you have a light by your bed that is easy to reach. Do not use any sheets or blankets that are too big for your bed. They should not hang down onto the floor. Have a firm chair that has side arms. You can use this for support while you get dressed. Do not have throw rugs and other things on the floor that can make you trip. What can I do in the kitchen? Clean up any spills right away. Avoid walking on wet floors. Keep items that you use a lot in easy-to-reach places. If you need to reach something above you, use a strong step stool that has a grab bar. Keep electrical cords out of the way. Do not use floor polish or wax that makes floors slippery. If you must use wax, use non-skid floor wax. Do not have throw rugs and other things on the floor that can make you trip. What can I do with my stairs? Do not leave any items on the stairs. Make sure that  there are handrails on both sides of the stairs and use them. Fix handrails that are broken or loose. Make sure that handrails are as long as the stairways. Check any carpeting to make sure that it is firmly attached to the stairs. Fix any carpet that is loose or worn. Avoid having throw rugs at the top or bottom of the stairs. If you do have throw rugs, attach them to the floor with carpet tape. Make sure that you have a light switch at the top of the stairs and the bottom of the stairs. If you do not have them, ask someone to add them for you. What else can I do to help prevent falls? Wear  shoes that: Do not have high heels. Have rubber bottoms. Are comfortable and fit you well. Are closed at the toe. Do not wear sandals. If you use a stepladder: Make sure that it is fully opened. Do not climb a closed stepladder. Make sure that both sides of the stepladder are locked into place. Ask someone to hold it for you, if possible. Clearly mark and make sure that you can see: Any grab bars or handrails. First and last steps. Where the edge of each step is. Use tools that help you move around (mobility aids) if they are needed. These include: Canes. Walkers. Scooters. Crutches. Turn on the lights when you go into a dark area. Replace any light bulbs as soon as they burn out. Set up your furniture so you have a clear path. Avoid moving your furniture around. If any of your floors are uneven, fix them. If there are any pets around you, be aware of where they are. Review your medicines with your doctor. Some medicines can make you feel dizzy. This can increase your chance of falling. Ask your doctor what other things that you can do to help prevent falls. This information is not intended to replace advice given to you by your health care provider. Make sure you discuss any questions you have with your health care provider. Document Released: 04/18/2009 Document Revised: 11/28/2015 Document Reviewed: 07/27/2014 Elsevier Interactive Patient Education  2017 [employer].

## 2021-03-13 NOTE — Progress Notes (Signed)
Subjective:   Michael Snyder is a 70 y.o. male who presents for Medicare Annual/Subsequent preventive examination.  Review of Systems     Cardiac Risk Factors include: advanced age (>72mn, >>57women);diabetes mellitus;family history of premature cardiovascular disease;hypertension;male gender     Objective:    Today's Vitals   03/13/21 0857  BP: 110/70  Pulse: 91  Temp: 98.2 F (36.8 C)  TempSrc: Temporal  SpO2: 99%  Weight: 157 lb (71.2 kg)  Height: 6' (1.829 m)  PainSc: 0-No pain   Body mass index is 21.29 kg/m.  Advanced Directives 03/13/2021 08/19/2020 07/14/2020 02/20/2020 09/28/2019 03/28/2019 02/16/2019  Does Patient Have a Medical Advance Directive? Yes Yes Yes No No No No  Type of Advance Directive Living will;Healthcare Power of AExcelLiving will - - - -  Does patient want to make changes to medical advance directive? No - Patient declined - - - - - -  Copy of [place] Chart? No - copy requested - No - copy requested - - - -  Would patient like information on creating a medical advance directive? - - - No - Patient declined No - Patient declined No - Patient declined No - Patient declined    Current Medications (verified) Outpatient Encounter Medications as of 03/13/2021  Medication Sig   amLODipine (NORVASC) 2.5 MG tablet Take 2.5 mg by mouth daily.   aspirin EC 81 MG tablet Take 81 mg daily by mouth.   atorvastatin (LIPITOR) 20 MG tablet Take 1 tablet (20 mg total) by mouth daily.   Blood Pressure Monitoring (BLOOD PRESSURE KIT) DEVI 1 kit by Does not apply route daily.   brimonidine (ALPHAGAN) 0.2 % ophthalmic solution SMARTSIG:In Eye(s)   Calcium Carbonate-Vitamin D (CALTRATE 600+D) 600-400 MG-UNIT per tablet Take 1 tablet by mouth 2 (two) times daily.   Cholecalciferol (VITAMIN D) 50 MCG (2000 UT) tablet Take 2,000 Units by mouth daily.   Continuous Blood Gluc Receiver (FREESTYLE LIBRE 2 READER) DEVI Use as  directed to check blood sugar   Continuous Blood Gluc Sensor (FREESTYLE LIBRE 2 SENSOR) MISC 1 Device by Does not apply route every 14 (fourteen) days.   dapagliflozin propanediol (FARXIGA) 10 MG TABS tablet Take 10 mg by mouth daily.   hydrochlorothiazide (MICROZIDE) 12.5 MG capsule Take 12.5 mg by mouth daily.   insulin glargine (LANTUS SOLOSTAR) 100 UNIT/ML Solostar Pen Inject 10 Units into the skin every morning. And pen needles 1/day   Lancets (ONETOUCH DELICA PLUS [id number] MISC CHECK BLOOD SUGAR TWICE  DAILY   latanoprost (XALATAN) 0.005 % ophthalmic solution SMARTSIG:In Eye(s)   levothyroxine (SYNTHROID) 25 MCG tablet Take 1 tablet (25 mcg total) by mouth daily before breakfast.   linaclotide (LINZESS) 145 MCG CAPS capsule Take 1 capsule (145 mcg total) by mouth daily before breakfast.   Multiple Vitamin (MULTIVITAMIN WITH MINERALS) TABS tablet Take 1 tablet by mouth daily.   Omega-3 Fatty Acids (FISH OIL) 300 MG CAPS Take 1 capsule (300 mg total) by mouth daily.   ONETOUCH VERIO test strip TEST TWICE DAILY   pioglitazone (ACTOS) 30 MG tablet Take 1 tablet (30 mg total) by mouth daily.   polyethylene glycol powder (GLYCOLAX/MIRALAX) 17 GM/SCOOP powder Take 17 g by mouth 2 (two) times daily as needed.   potassium chloride (KLOR-CON) 10 MEQ tablet Take 10 mEq by mouth 2 (two) times daily.   RYBELSUS 14 MG TABS TAKE 1 TABLET BY MOUTH  DAILY   triamcinolone ointment (  KENALOG) 0.5 % APPLY TOPICALLY TWICE DAILY   No facility-administered encounter medications on file as of 03/13/2021.    Allergies (verified) Clindamycin/lincomycin and Ace inhibitors   History: Past Medical History:  Diagnosis Date   Alcohol abuse    Arthritis    Diabetes mellitus, type 2 (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   Pacemaker    Medtronic Adapta [id number]   Seizure disorder ([place])     Seizures ([place])    last time in 1990's   Past Surgical History:  Procedure Laterality Date   LACERATION REPAIR  1980   scalp   PACEMAKER LEAD REMOVAL N/A 03/23/2014   Procedure: PACEMAKER LEAD REMOVAL;  Surgeon: Evans Lance, MD;  Location: [facility] OR;  Service: Cardiovascular;  Laterality: N/A;   PTDVP     7/08 medtronic DDD   Family History  Problem Relation Age of Onset   Hypertension Mother    COPD Mother    Hypertension Brother    Cancer Brother        leukemia   Hypertension Brother    Diabetes Neg Hx    Social History   Socioeconomic History   Marital status: Legally Separated    Spouse name: Not on file   Number of children: 3   Years of education: Not on file   Highest education level: Not on file  Occupational History   Occupation: Games developer, truck driver  Tobacco Use   Smoking status: Never   Smokeless tobacco: Never  Vaping Use   Vaping Use: Never used  Substance and Sexual Activity   Alcohol use: No    Comment: quit 20 yrs ago   Drug use: No   Sexual activity: Yes    Partners: Female  Other Topics Concern   Not on file  Social History Narrative   12th grade education. H/O substance abuse- now abstemious and works with other in recovery..Attends NA on a regular basis and serves as a sponsor. Married 18 years-divorced. Married 5 years 2nd marriage.3 daughters previous marriage, 9 grandchildren, 3 great-grands. 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 bring in CBG monitor from home?: No  How often do you need to have someone help you when you read instructions, pamphlets, or other written materials from your doctor or pharmacy?: 1 - Never What is the last grade level you completed in school?: 11th grade  Diabetic? yes  Interpreter Needed?: No  Information entered by :: Michael Abu, LPN   Activities of Daily Living In your present state of health, do you have any difficulty performing the following activities: 03/13/2021  Hearing? N  Vision? N  Difficulty concentrating or making decisions? N  Walking or  climbing stairs? N  Dressing or bathing? N  Doing errands, shopping? N  Preparing Food and eating ? N  Using the Toilet? N  In the past six months, have you accidently leaked urine? N  Do you have problems with loss of bowel control? N  Managing your Medications? N  Managing your Finances? N  Housekeeping or managing your Housekeeping? N  Some recent data might be hidden    Patient Care Team: Hoyt Koch, MD as PCP - General (Internal Medicine) Charlton Haws, [facility] as Pharmacist (Pharmacist)  Indicate any recent Medical Services you may have received from other than [facility] providers in the past year (date may be approximate).     Assessment:   This is a routine wellness examination for Michael Snyder.  Hearing/Vision screen No results found.  Dietary issues and exercise activities discussed: Current Exercise Habits: Home exercise routine (Still employed with two jobs), Type of exercise: walking, Time (Minutes): 30, Frequency (Times/Week): 5, Weekly Exercise (Minutes/Week): 150, Intensity: Moderate   Goals Addressed   None   Depression Screen PHQ 2/9 Scores 03/13/2021 08/19/2020 02/20/2020 09/28/2019 02/16/2019 01/12/2019 12/30/2017  PHQ - 2 Score 0 0 0 0 0 0 0    Fall Risk Fall Risk  03/13/2021 08/19/2020 02/20/2020 11/27/2019 09/28/2019  Falls in the past  year? 0 0 0 0 0  Number falls in past yr: 0 - 0 0 -  Injury with Fall? 0 - 0 0 -  Risk for fall due to : No Fall Risks - No Fall Risks - -  Follow up Falls evaluation completed - Falls evaluation completed;Education provided - -    FALL RISK PREVENTION PERTAINING TO THE HOME:  Any stairs in or around the home? No  If so, are there any without handrails? No  Home free of loose throw rugs in walkways, pet beds, electrical cords, etc? Yes  Adequate lighting in your home to reduce risk of falls? Yes   ASSISTIVE DEVICES UTILIZED TO PREVENT FALLS:  Life alert? Yes  Use of a cane, walker or w/c? No  Grab bars in the bathroom? No  Shower chair or bench in shower? No  Elevated toilet seat or a handicapped toilet? No   TIMED UP AND GO:  Was the test performed? Yes .  Length of time to ambulate 10 feet: 5 sec.   Gait steady and fast without use of assistive device  Cognitive Function: Normal cognitive status assessed by direct observation by this Nurse Health Advisor. No abnormalities found.       6CIT Screen 02/20/2020  What Year? 0 points  What month? 0 points  What time? 0 points  Count back from 20 0 points  Months in reverse 0 points  Repeat phrase 0 points  Total Score 0    Immunizations Immunization History  Administered Date(s) Administered   Fluad Quad(high Dose 65+) 04/20/2019, 07/29/2020   Influenza Whole 03/30/2008, 04/16/2009, 06/05/2010   Influenza, High Dose Seasonal PF 06/02/2016, 04/05/2017, 03/29/2018   Influenza,inj,Quad PF,6+ Mos 05/16/2013, 03/08/2014, 08/21/2015   PFIZER(Purple Top)SARS-COV-2 Vaccination 10/27/2019, 11/17/2019, 05/28/2020   Pneumococcal Conjugate-13 11/01/2014   Pneumococcal Polysaccharide-23 09/13/2012, 12/30/2017   Td 03/30/2008   Tdap 05/02/2018   Zoster Recombinat (Shingrix) 12/30/2017   Zoster, Live 07/20/2014    TDAP status: Up to date  Flu Vaccine status: Up to date  Pneumococcal vaccine status: Up to date  Covid-19  vaccine status: Completed vaccines  Qualifies for Shingles Vaccine? Yes   Zostavax completed Yes  Shingrix Completed?: Yes  Screening Tests Health Maintenance  Topic Date Due   Zoster Vaccines- Shingrix (2 of 2) 02/24/2018   OPHTHALMOLOGY EXAM  06/07/2018   COVID-19 Vaccine (4 - Booster for Pfizer series) 09/25/2020   INFLUENZA VACCINE  02/03/2021   HEMOGLOBIN A1C  03/26/2021   URINE MICROALBUMIN  06/17/2021   FOOT EXAM  11/26/2021   COLONOSCOPY (Pts 45-55yr Insurance coverage will need to be confirmed)  12/16/2022   TETANUS/TDAP  05/02/2028   Hepatitis C Screening  Completed   PNA vac Low Risk Adult  Completed   HPV VACCINES  Aged Out    Health Maintenance  Health Maintenance Due  Topic Date Due   Zoster Vaccines- Shingrix (2 of 2) 02/24/2018   OPHTHALMOLOGY EXAM  06/07/2018   COVID-19 Vaccine (4 - Booster for Pfizer series) 09/25/2020   INFLUENZA VACCINE  02/03/2021    Colorectal cancer screening: Type of screening: Colonoscopy. Completed 12/15/2017. Repeat every 5 years (scheduled for 03/17/21 with Dr. JJuanita Craver  Lung Cancer Screening: (Low Dose CT Chest recommended if Age 288-80years, 30 pack-year currently smoking OR have quit w/in 15years.) does not qualify.   Lung Cancer Screening Referral: no  Additional Screening:  Hepatitis C Screening: does qualify; Completed yes  Vision Screening: Recommended annual ophthalmology exams for early detection of glaucoma and other disorders of the eye. Is the patient up to date with their annual eye exam?  Yes  Who is the provider or what is the name of the office in which the patient attends annual eye exams? [facility] If pt is not established with a provider, would they like to be referred to a provider to establish care? No .   Dental Screening: Recommended annual dental exams for proper oral hygiene  Community Resource Referral / Chronic Care Management: CRR required this visit?  No   CCM required this  visit?  No      Plan:     I have personally reviewed and noted the following in the patient's chart:   Medical and social history Use of alcohol, tobacco or illicit drugs  Current medications and supplements including opioid prescriptions. Patient is not currently taking opioid prescriptions. Functional ability and status Nutritional status Physical activity Advanced directives List of other physicians Hospitalizations, surgeries, and ER visits in previous 12 months Vitals Screenings to include cognitive, depression, and falls Referrals and appointments  In addition, I have reviewed and discussed with patient certain preventive protocols, quality metrics, and best practice recommendations. A written personalized care plan for preventive services as well as general preventive health recommendations were provided to patient.     SSheral Flow LPN   [id number]  Nurse Notes:  Hearing Screening - Comments:: Patient denied any hearing difficulty.  No hearing aids. Vision Screening - Comments:: Patient wears eyeglasses.  [facility]

## 2021-03-14 NOTE — Assessment & Plan Note (Signed)
BP at goal on amlodipine 2.5 mg daily and hctz 12.5 mg daily. Checking CMP and adjust as needed.

## 2021-03-14 NOTE — Assessment & Plan Note (Signed)
Flu shot given today. Covid-19 encouraged booster. Pneumonia complete. Shingrix due 2nd he thinks he has gotten but does not know date. Tetanus due 2029. Colonoscopy due 2024. Counseled about sun safety and mole surveillance. Counseled about the dangers of distracted driving. Given 10 year screening recommendations.

## 2021-03-14 NOTE — Assessment & Plan Note (Signed)
Previously uncontrolled and upcoming endo apt soon. Checking HgA1c today. Taking farxiga and lantus 10 units daily and actos 30 mg daily and rybelsus 14 mg daily. Checking urine microalbumin. Not on ARB/ACE-I. Is on statin.

## 2021-03-14 NOTE — Progress Notes (Signed)
   Subjective:   Patient ID: Michael Snyder, male    DOB: 1951-02-13, 70 y.o.   MRN: [id number]  HPI The patient is a 70 YO man coming in for physical.   PMH, FMH, social history reviewed and updated  Review of Systems  Constitutional: Negative.   HENT: Negative.    Eyes: Negative.   Respiratory:  Negative for cough, chest tightness and shortness of breath.   Cardiovascular:  Negative for chest pain, palpitations and leg swelling.  Gastrointestinal:  Negative for abdominal distention, abdominal pain, constipation, diarrhea, nausea and vomiting.  Musculoskeletal: Negative.   Skin: Negative.   Neurological: Negative.   Psychiatric/Behavioral: Negative.     Objective:  Physical Exam Constitutional:      Appearance: He is well-developed.  HENT:     Head: Normocephalic and atraumatic.  Cardiovascular:     Rate and Rhythm: Normal rate and regular rhythm.  Pulmonary:     Effort: Pulmonary effort is normal. No respiratory distress.     Breath sounds: Normal breath sounds. No wheezing or rales.  Abdominal:     General: Bowel sounds are normal. There is no distension.     Palpations: Abdomen is soft.     Tenderness: There is no abdominal tenderness. There is no rebound.  Musculoskeletal:     Cervical back: Normal range of motion.  Skin:    General: Skin is warm and dry.  Neurological:     Mental Status: He is alert and oriented to person, place, and time.     Coordination: Coordination normal.    Vitals:   03/13/21 0928  BP: 110/70  Pulse: 91  Resp: 18  Temp: 98.2 F (36.8 C)  TempSrc: Oral  SpO2: 99%  Weight: 157 lb (71.2 kg)  Height: 6' (1.829 m)    This visit occurred during the SARS-CoV-2 public health emergency.  Safety protocols were in place, including screening questions prior to the visit, additional usage of staff PPE, and extensive cleaning of exam room while observing appropriate contact time as indicated for disinfecting solutions.   Assessment & Plan:   Flu shot given at visit

## 2021-03-14 NOTE — Assessment & Plan Note (Signed)
Taking linzess with good relief.  

## 2021-03-18 ENCOUNTER — Other Ambulatory Visit: Payer: Self-pay | Admitting: Internal Medicine

## 2021-03-22 NOTE — Progress Notes (Signed)
Electrophysiology Office Note Date: 03/24/2021  ID:  Michael Snyder, DOB 1951/06/29, MRN [id number]  PCP: Hoyt Koch, MD Primary Cardiologist: None Electrophysiologist: Virl Axe, MD   CC: Pacemaker follow-up  Michael Snyder is a 70 y.o. male seen today for Virl Axe, MD for routine electrophysiology followup.  Since last being seen in our clinic the patient reports doing well. His sugars have been running high and PCP managing closely. He denies chest pain, palpitations, dyspnea, PND, orthopnea, nausea, vomiting, dizziness, syncope, edema, weight gain, or early satiety.  Device History: MDT dual chamber PPM implanted 2008 for SSS; gen change 2015 with new RV lead (previous RV lead dislodged)  Past Medical History:  Diagnosis Date   Alcohol abuse    Arthritis    Diabetes mellitus, type 2 ([place])    Head injury, closed, without LOC 1980s   History of alcohol abuse    Hyperlipidemia    Hypertension    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   Pacemaker    Medtronic Adapta ADDR01   Seizure disorder ([place])    Seizures ([place])    last time in 1990's   Past Surgical History:  Procedure Laterality Date   LACERATION REPAIR  1980   scalp   PACEMAKER LEAD REMOVAL N/A 03/23/2014   Procedure: PACEMAKER LEAD REMOVAL;  Surgeon: Evans Lance, MD;  Location: [facility];  Service: Cardiovascular;  Laterality: N/A;   PTDVP     7/08 medtronic DDD    Current Outpatient Medications  Medication Sig Dispense Refill   amLODipine (NORVASC) 2.5 MG tablet Take 2.5 mg by mouth daily.     aspirin EC 81 MG tablet Take 81 mg daily by mouth.     atorvastatin (LIPITOR) 20 MG tablet Take 1 tablet (20 mg total) by mouth daily. 90 tablet 3   Blood Pressure Monitoring (BLOOD PRESSURE KIT) DEVI 1 kit by Does not apply route daily. 1 each 0   brimonidine (ALPHAGAN) 0.2 % ophthalmic solution SMARTSIG:In Eye(s)     Calcium Carbonate-Vitamin D (CALTRATE 600+D) 600-400 MG-UNIT per tablet Take 1  tablet by mouth 2 (two) times daily. 180 tablet 3   Cholecalciferol (VITAMIN D) 50 MCG (2000 UT) tablet Take 2,000 Units by mouth daily.     Continuous Blood Gluc Receiver (FREESTYLE LIBRE 2 READER) DEVI Use as directed to check blood sugar 1 each 0   Continuous Blood Gluc Sensor (FREESTYLE LIBRE 2 SENSOR) MISC 1 Device by Does not apply route every 14 (fourteen) days. 6 each 3   dapagliflozin propanediol (FARXIGA) 10 MG TABS tablet Take 10 mg by mouth daily.     hydrochlorothiazide (MICROZIDE) 12.5 MG capsule Take 12.5 mg by mouth daily.     insulin glargine (LANTUS SOLOSTAR) 100 UNIT/ML Solostar Pen Inject 10 Units into the skin every morning. And pen needles 1/day 15 mL 3   Lancets (ONETOUCH DELICA PLUS [id number]) MISC CHECK BLOOD SUGAR TWICE  DAILY 200 each 3   latanoprost (XALATAN) 0.005 % ophthalmic solution SMARTSIG:In Eye(s)     levothyroxine (SYNTHROID) 25 MCG tablet Take 1 tablet (25 mcg total) by mouth daily before breakfast. 90 tablet 3   linaclotide (LINZESS) 145 MCG CAPS capsule Take 1 capsule (145 mcg total) by mouth daily before breakfast. 90 capsule 3   Multiple Vitamin (MULTIVITAMIN WITH MINERALS) TABS tablet Take 1 tablet by mouth daily.     Omega-3 Fatty Acids (FISH OIL) 300 MG CAPS Take 1 capsule (300 mg total) by  mouth daily. 90 capsule 3   ONETOUCH VERIO test strip TEST TWICE DAILY 200 strip 3   pioglitazone (ACTOS) 30 MG tablet Take 1 tablet (30 mg total) by mouth daily. 90 tablet 3   polyethylene glycol powder (GLYCOLAX/MIRALAX) 17 GM/SCOOP powder Take 17 g by mouth 2 (two) times daily as needed. 255 g 0   potassium chloride (KLOR-CON) 10 MEQ tablet Take 10 mEq by mouth 2 (two) times daily.     RYBELSUS 14 MG TABS TAKE 1 TABLET BY MOUTH  DAILY 90 tablet 3   triamcinolone ointment (KENALOG) 0.5 % APPLY TO AFFECTED AREA(S)  TOPICALLY TWICE DAILY 90 g 0   No current facility-administered medications for this visit.    Allergies:   Clindamycin/lincomycin and Ace inhibitors    Social History: Social History   Socioeconomic History   Marital status: Legally Separated    Spouse name: Not on file   Number of children: 3   Years of education: Not on file   Highest education level: Not on file  Occupational History   Occupation: Games developer, truck driver  Tobacco Use   Smoking status: Never   Smokeless tobacco: Never  Vaping Use   Vaping Use: Never used  Substance and Sexual Activity   Alcohol use: No    Comment: quit 20 yrs ago   Drug use: No   Sexual activity: Yes    Partners: Female  Other Topics Concern   Not on file  Social History Narrative   12th grade education. H/O substance abuse- now abstemious and works with other in recovery..Attends NA on a regular basis and serves as a sponsor. Married 18 years-divorced. Married 5 years 2nd marriage.3 daughters previous marriage, 9 grandchildren, 3 great-grands. Work- short haul truck Probation officer of Radio broadcast assistant Strain: Not on [employer] Insecurity: Not on file  Transportation Needs: Not on file  Physical Activity: Not on file  Stress: Not on file  Social Connections: Not on file  Intimate Partner Violence: Not on file    Family History: Family History  Problem Relation Age of Onset   Hypertension Mother    COPD Mother    Hypertension Brother    Cancer Brother        leukemia   Hypertension Brother    Diabetes Neg Hx      Review of Systems: All other systems reviewed and are otherwise negative except as noted above.  Physical Exam: Vitals:   03/24/21 0838  BP: (!) 140/92  Pulse: 81  SpO2: 99%  Weight: 158 lb 3.2 oz (71.8 kg)  Height: 6' (1.829 m)     GEN- The patient is well appearing, alert and oriented x 3 today.   HEENT: normocephalic, atraumatic; sclera clear, conjunctiva pink; hearing intact; oropharynx clear; neck supple  Lungs- Clear to ausculation bilaterally, normal work of breathing.  No wheezes, rales, rhonchi Heart-  Regular rate and rhythm, no murmurs, rubs or gallops  GI- soft, non-tender, non-distended, bowel sounds present  Extremities- no clubbing or cyanosis. No edema MS- no significant deformity or atrophy Skin- warm and dry, no rash or lesion; PPM pocket well healed Psych- euthymic mood, full affect Neuro- strength and sensation are intact  PPM Interrogation- reviewed in detail today,  See PACEART report  EKG:  EKG is ordered today. Personal review of ekg ordered today shows A pacing with prolonged 1st degree AV block at 81 bpm  Recent Labs: 08/02/2020: TSH 4.65 03/13/2021: ALT 29; BUN 19; Creatinine, Ser 1.76; Hemoglobin 12.6; Platelets 147.0; Potassium 3.3; Sodium 138   Wt Readings from Last 3 Encounters:  03/24/21 158 lb 3.2 oz (71.8 kg)  03/13/21 157 lb (71.2 kg)  03/13/21 157 lb (71.2 kg)     Other studies Reviewed: Additional studies/ records that were reviewed today include: Previous EP office notes, Previous remote checks, Most recent labwork.   Assessment and Plan:  1. Sick sinus syndrome s/p Medtronic PPM  Normal PPM function See Pace Art report No changes today  2. Paroxysmal atrial fibrillation Burden <1% by device, most AHR episodes appear 1:1 atrially driven tachycardia He has previously declined [place] despite CHA2DS2/VASc of at least 3. Revisit if burden increases.   3. NSVT Infrequent Stop amlodipine Add Toprol XL 25 mg daily Recheck BMET with recent K of 3.3  4. Hypokalemia K 3.3 on most recent BMET With arrhythmias, Keep K > 4.0 and Mg > 2.0   5. HTN He has been intolerant to higher doses of Amlodipine. With PAF and NSVT, will stop amlodipine and start Toprol XL 25 mg daily.    Current medicines are reviewed at length with the patient today.    Labs/ tests ordered today include:  Orders Placed This Encounter  Procedures   Basic metabolic panel   EKG [id number]     Disposition:   Follow up with Dr. Caryl Comes in 12 Months    Signed, Annamaria Helling  03/24/2021 8:45 AM  [facility] [address] [place] [address] [phone] (office) [phone] (fax)

## 2021-03-24 ENCOUNTER — Other Ambulatory Visit: Payer: Self-pay

## 2021-03-24 ENCOUNTER — Encounter: Payer: Self-pay | Admitting: Student

## 2021-03-24 ENCOUNTER — Telehealth: Payer: Self-pay

## 2021-03-24 ENCOUNTER — Ambulatory Visit (INDEPENDENT_AMBULATORY_CARE_PROVIDER_SITE_OTHER): Payer: Medicare Other | Admitting: Student

## 2021-03-24 VITALS — BP 140/92 | HR 81 | Ht 72.0 in | Wt 158.2 lb

## 2021-03-24 DIAGNOSIS — I472 Ventricular tachycardia, unspecified: Secondary | ICD-10-CM

## 2021-03-24 DIAGNOSIS — E876 Hypokalemia: Secondary | ICD-10-CM

## 2021-03-24 DIAGNOSIS — I480 Paroxysmal atrial fibrillation: Secondary | ICD-10-CM

## 2021-03-24 DIAGNOSIS — I495 Sick sinus syndrome: Secondary | ICD-10-CM

## 2021-03-24 LAB — CUP PACEART INCLINIC DEVICE CHECK
Battery Impedance: 427 Ohm
Battery Remaining Longevity: 107 mo
Battery Voltage: 2.79 V
Brady Statistic AP VP Percent: 0 %
Brady Statistic AP VS Percent: 8 %
Brady Statistic AS VP Percent: 1 %
Brady Statistic AS VS Percent: 91 %
Date Time Interrogation Session: 20220919085806
Implantable Lead Implant Date: 20080711
Implantable Lead Implant Date: 20150918
Implantable Lead Location: 753859
Implantable Lead Location: 753860
Implantable Lead Model: 5076
Implantable Lead Model: 5076
Implantable Pulse Generator Implant Date: 20150918
Lead Channel Impedance Value: 517 Ohm
Lead Channel Impedance Value: 531 Ohm
Lead Channel Pacing Threshold Amplitude: 0.75 V
Lead Channel Pacing Threshold Amplitude: 0.75 V
Lead Channel Pacing Threshold Amplitude: 0.75 V
Lead Channel Pacing Threshold Amplitude: 0.875 V
Lead Channel Pacing Threshold Pulse Width: 0.4 ms
Lead Channel Pacing Threshold Pulse Width: 0.4 ms
Lead Channel Pacing Threshold Pulse Width: 0.4 ms
Lead Channel Pacing Threshold Pulse Width: 0.4 ms
Lead Channel Sensing Intrinsic Amplitude: 0.7 mV
Lead Channel Sensing Intrinsic Amplitude: 5.6 mV
Lead Channel Setting Pacing Amplitude: 2 V
Lead Channel Setting Pacing Amplitude: 2.5 V
Lead Channel Setting Pacing Pulse Width: 0.4 ms
Lead Channel Setting Sensing Sensitivity: 2 mV

## 2021-03-24 LAB — BASIC METABOLIC PANEL
BUN/Creatinine Ratio: 12 (ref 10–24)
BUN: 21 mg/dL (ref 8–27)
CO2: 21 mmol/L (ref 20–29)
Calcium: 9.7 mg/dL (ref 8.6–10.2)
Chloride: 99 mmol/L (ref 96–106)
Creatinine, Ser: 1.8 mg/dL — ABNORMAL HIGH (ref 0.76–1.27)
Glucose: 291 mg/dL — ABNORMAL HIGH (ref 65–99)
Potassium: 3.4 mmol/L — ABNORMAL LOW (ref 3.5–5.2)
Sodium: 136 mmol/L (ref 134–144)
eGFR: 40 mL/min/{1.73_m2} — ABNORMAL LOW (ref >59)

## 2021-03-24 MED ORDER — METOPROLOL SUCCINATE ER 25 MG PO TB24
25.0000 mg | ORAL_TABLET | Freq: Every day | ORAL | 3 refills | Status: DC
Start: 2021-03-24 — End: 2021-12-16

## 2021-03-24 MED ORDER — METOPROLOL SUCCINATE ER 25 MG PO TB24: 25.0000 mg | ORAL_TABLET | Freq: Every day | ORAL | 3 refills | Status: DC

## 2021-03-24 NOTE — Patient Instructions (Signed)
Medication Instructions:  Your physician has recommended you make the following change in your medication:   DISCONTINUE: Amlodipine START: Metoprolol Succinate 25mg  daily  *If you need a refill on your cardiac medications before your next appointment, please call your pharmacy*   Lab Work: TODAY: BMET  If you have labs (blood work) drawn today and your tests are completely normal, you will receive your results only by: MyChart Message (if you have MyChart) OR A paper copy in the mail If you have any lab test that is abnormal or we need to change your treatment, we will call you to review the results.   Follow-Up: At [facility], you and your health needs are our priority.  As part of our continuing mission to provide you with exceptional heart care, we have created designated Provider Care Teams.  These Care Teams include your primary Cardiologist (physician) and Advanced Practice Providers (APPs -  Physician Assistants and Nurse Practitioners) who all work together to provide you with the care you need, when you need it.   Your next appointment:   1 year(s)  The format for your next appointment:   In Person  Provider:   You may see [facility], MD or one of the following Advanced Practice Providers on your designated Care Team:   Sherryl Manges, Francis Dowse "[facility]" [place], [place]

## 2021-03-24 NOTE — Telephone Encounter (Signed)
Please contact pt to schedle a F/U appt before papers from [place] will be completed per Everardo All

## 2021-03-25 ENCOUNTER — Other Ambulatory Visit: Payer: Self-pay

## 2021-03-25 DIAGNOSIS — I480 Paroxysmal atrial fibrillation: Secondary | ICD-10-CM

## 2021-03-25 MED ORDER — POTASSIUM CHLORIDE ER 20 MEQ PO TBCR: 20.0000 meq | EXTENDED_RELEASE_TABLET | Freq: Two times a day (BID) | ORAL | 3 refills | Status: DC

## 2021-03-25 NOTE — Telephone Encounter (Signed)
Patient is scheduled for appointment on 03/26/21

## 2021-03-26 ENCOUNTER — Telehealth: Payer: Self-pay | Admitting: Endocrinology

## 2021-03-26 ENCOUNTER — Ambulatory Visit (INDEPENDENT_AMBULATORY_CARE_PROVIDER_SITE_OTHER): Payer: Medicare Other | Admitting: Endocrinology

## 2021-03-26 ENCOUNTER — Other Ambulatory Visit: Payer: Self-pay

## 2021-03-26 ENCOUNTER — Other Ambulatory Visit (HOSPITAL_COMMUNITY): Payer: Self-pay

## 2021-03-26 VITALS — BP 118/80 | HR 78 | Ht 72.0 in | Wt 156.2 lb

## 2021-03-26 DIAGNOSIS — E118 Type 2 diabetes mellitus with unspecified complications: Secondary | ICD-10-CM

## 2021-03-26 DIAGNOSIS — E119 Type 2 diabetes mellitus without complications: Secondary | ICD-10-CM | POA: Diagnosis not present

## 2021-03-26 DIAGNOSIS — Z794 Long term (current) use of insulin: Secondary | ICD-10-CM | POA: Diagnosis not present

## 2021-03-26 LAB — POCT GLYCOSYLATED HEMOGLOBIN (HGB A1C): Hemoglobin A1C: 12.5 % — AB (ref 4.0–5.6)

## 2021-03-26 MED ORDER — FREESTYLE LIBRE 2 SENSOR MISC
1.0000 | 3 refills | Status: DC
  Filled 2021-03-26 (×2): qty 6, 84d supply, fill #0

## 2021-03-26 MED ORDER — DEXCOM G6 SENSOR MISC: 1.0000 | 3 refills | Status: DC

## 2021-03-26 MED ORDER — FREESTYLE LIBRE 2 SENSOR MISC: 3 refills | Status: DC

## 2021-03-26 MED ORDER — FREESTYLE LIBRE 2 READER DEVI
0 refills | Status: DC
  Filled 2021-03-26: qty 1, 90d supply, fill #0

## 2021-03-26 MED ORDER — DEXCOM G6 TRANSMITTER MISC: 1.0000 | Freq: Once | 1 refills | Status: AC

## 2021-03-26 MED ORDER — DEXCOM G6 RECEIVER DEVI: 1.0000 | Freq: Once | 1 refills | Status: AC

## 2021-03-26 NOTE — Progress Notes (Signed)
Subjective:    Patient ID: Michael Snyder, male    DOB: 03/14/1951, 70 y.o.   MRN: [id number]  HPI Pt returns for f/u of diabetes mellitus:  DM type: Insulin-requiring type 2 Dx'ed: 4239 Complications: stage 3 CRI.   Therapy: insulin since 2021, and 2 oral meds.   DKA: never Severe hypoglycemia: never Pancreatitis: never Pancreatic imaging: CT (2020): Fatty atrophy. Calcification of the ducts, and dilatation of the main duct.   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  Other: fructosamine converts to higher A1c than A1c itself and cbg's; pt meter cbg was similar to office meter.   Interval history: no cbg record, but states cbg's vary from 165-223.  pt states he feels well in general.  He takes Lantus at HS.  He still skips insulin several times per week, due to cbg in the low-100's.   Past Medical History:  Diagnosis Date   Alcohol abuse    Arthritis    Diabetes mellitus, type 2 ([place])    Head injury, closed, without LOC 1980s   History of alcohol abuse    Hyperlipidemia    Hypertension    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   Pacemaker    Medtronic Adapta ADDR01   Seizure disorder ([place])    Seizures ([place])    last time in 1990's    Past Surgical History:  Procedure Laterality Date   LACERATION REPAIR  1980   scalp   PACEMAKER LEAD REMOVAL N/A 03/23/2014   Procedure: PACEMAKER LEAD REMOVAL;  Surgeon: Evans Lance, MD;  Location: [facility];  Service: Cardiovascular;  Laterality: N/A;   PTDVP     7/08 medtronic DDD    Social History   Socioeconomic History   Marital status: Legally Separated    Spouse name: Not on file   Number of children: 3   Years of education: Not on file   Highest education level: Not on file  Occupational History   Occupation: Games developer, truck driver  Tobacco Use   Smoking status: Never   Smokeless tobacco: Never  Vaping Use   Vaping Use: Never used  Substance and Sexual Activity   Alcohol use: No    Comment: quit 20 yrs ago   Drug use: No    Sexual activity: Yes    Partners: Female  Other Topics Concern   Not on file  Social History Narrative   12th grade education. H/O substance abuse- now abstemious and works with other in recovery..Attends NA on a regular basis and serves as a sponsor. Married 18 years-divorced. Married 5 years 2nd marriage.3 daughters previous marriage, 9 grandchildren, 3 great-grands. Work- Merchant navy officer            Social Determinants of Radio broadcast assistant Strain: Not on [employer] Insecurity: Not on file  Transportation Needs: Not on file  Physical Activity: Not on file  Stress: Not on file  Social Connections: Not on file  Intimate Partner Violence: Not on file    Current Outpatient Medications on File Prior to Visit  Medication Sig Dispense Refill   aspirin EC 81 MG tablet Take 81 mg daily by mouth.     atorvastatin (LIPITOR) 20 MG tablet Take 1 tablet (20 mg total) by mouth daily. 90 tablet 3   Blood Pressure Monitoring (BLOOD PRESSURE KIT) DEVI 1 kit by Does not apply route daily. 1 each 0   brimonidine (ALPHAGAN) 0.2 % ophthalmic solution SMARTSIG:In Eye(s)  Calcium Carbonate-Vitamin D (CALTRATE 600+D) 600-400 MG-UNIT per tablet Take 1 tablet by mouth 2 (two) times daily. 180 tablet 3   Cholecalciferol (VITAMIN D) 50 MCG (2000 UT) tablet Take 2,000 Units by mouth daily.     dapagliflozin propanediol (FARXIGA) 10 MG TABS tablet Take 10 mg by mouth daily.     hydrochlorothiazide (MICROZIDE) 12.5 MG capsule Take 12.5 mg by mouth daily.     insulin glargine (LANTUS SOLOSTAR) 100 UNIT/ML Solostar Pen Inject 10 Units into the skin every morning. And pen needles 1/day 15 mL 3   Lancets (ONETOUCH DELICA PLUS [id number]) MISC CHECK BLOOD SUGAR TWICE  DAILY 200 each 3   latanoprost (XALATAN) 0.005 % ophthalmic solution SMARTSIG:In Eye(s)     levothyroxine (SYNTHROID) 25 MCG tablet Take 1 tablet (25 mcg total) by mouth daily before breakfast. 90 tablet 3   linaclotide (LINZESS) 145  MCG CAPS capsule Take 1 capsule (145 mcg total) by mouth daily before breakfast. 90 capsule 3   metoprolol succinate (TOPROL XL) 25 MG 24 hr tablet Take 1 tablet (25 mg total) by mouth daily. 90 tablet 3   Multiple Vitamin (MULTIVITAMIN WITH MINERALS) TABS tablet Take 1 tablet by mouth daily.     Omega-3 Fatty Acids (FISH OIL) 300 MG CAPS Take 1 capsule (300 mg total) by mouth daily. 90 capsule 3   ONETOUCH VERIO test strip TEST TWICE DAILY 200 strip 3   pioglitazone (ACTOS) 30 MG tablet Take 1 tablet (30 mg total) by mouth daily. 90 tablet 3   polyethylene glycol powder (GLYCOLAX/MIRALAX) 17 GM/SCOOP powder Take 17 g by mouth 2 (two) times daily as needed. 255 g 0   potassium chloride 20 MEQ TBCR Take 20 mEq by mouth 2 (two) times daily. 180 tablet 3   RYBELSUS 14 MG TABS TAKE 1 TABLET BY MOUTH  DAILY 90 tablet 3   triamcinolone ointment (KENALOG) 0.5 % APPLY TO AFFECTED AREA(S)  TOPICALLY TWICE DAILY 90 g 0   No current facility-administered medications on file prior to visit.    Allergies  Allergen Reactions   Clindamycin/Lincomycin Swelling    Possible facial and lip swelling   Ace Inhibitors     Facial and lip swelling possibly associated    Family History  Problem Relation Age of Onset   Hypertension Mother    COPD Mother    Hypertension Brother    Cancer Brother        leukemia   Hypertension Brother    Diabetes Neg Hx     BP 118/80 (BP Location: Right Arm, Patient Position: Sitting, Cuff Size: Normal)   Pulse 78   Ht 6' (1.829 m)   Wt 156 lb 3.2 oz (70.9 kg)   SpO2 99%   BMI 21.18 kg/m    Review of Systems     Objective:   Physical Exam Pulses: dorsalis pedis intact bilat.   MSK: no deformity of the feet CV: no leg edema Skin:  no ulcer on the feet.  normal color and temp on the feet. Neuro: sensation is intact to touch on the feet.    Lab Results  Component Value Date   HGBA1C 12.5 (A) 03/26/2021      Assessment & Plan:  Insulin-requiring type 2 DM:  severe exacerbation Relative hypoglycemia: Next step is to see continuous glucose monitor tracing, due to pt withholding insulin due to this.  I told pt he needs to watch glucose carefully  Patient Instructions  I have sent a prescription to  [facility], for the sensors.  Please let us know if you are able to obtain them check your blood sugar twice a day.  vary the time of day when you check, between before the 3 meals, and at bedtime.  also check if you have symptoms of your blood sugar being too high or too low.  please keep a record of the readings and bring it to your next appointment here (or you can bring the meter itself).  You can write it on any piece of paper.  please call us sooner if your blood sugar goes below 70, or if you have a lot of readings over 200.   Please come back for a follow-up appointment in 1 month.

## 2021-03-26 NOTE — Telephone Encounter (Signed)
[facility] called to say pts Freestyle Libre sensors was not covered by Medicare Ins. To possibly change them to Dexcom. Or if patient can work with Felisa Bonier [phone] to order the sensors from them.

## 2021-03-26 NOTE — Patient Instructions (Addendum)
I have sent a prescription to [facility], for the sensors.  Please let us know if you are able to obtain them check your blood sugar twice a day.  vary the time of day when you check, between before the 3 meals, and at bedtime.  also check if you have symptoms of your blood sugar being too high or too low.  please keep a record of the readings and bring it to your next appointment here (or you can bring the meter itself).  You can write it on any piece of paper.  please call us sooner if your blood sugar goes below 70, or if you have a lot of readings over 200.   Please come back for a follow-up appointment in 1 month.

## 2021-03-26 NOTE — Telephone Encounter (Signed)
Pt calling in voiced that he went to the pharmacy to have the prescription filled Continuous Blood Gluc Sensor (FREESTYLE LIBRE 2 SENSOR) MISC and pharmacy states that the provider will have to call [facility] at  [phone] pharmacy is stating the insurance will not cover. If any questions patient states that we can call him back.

## 2021-04-01 ENCOUNTER — Ambulatory Visit (INDEPENDENT_AMBULATORY_CARE_PROVIDER_SITE_OTHER): Payer: Medicare Other | Admitting: Pharmacist

## 2021-04-01 ENCOUNTER — Other Ambulatory Visit: Payer: Self-pay

## 2021-04-01 ENCOUNTER — Other Ambulatory Visit: Payer: Medicare Other

## 2021-04-01 DIAGNOSIS — Z794 Long term (current) use of insulin: Secondary | ICD-10-CM

## 2021-04-01 DIAGNOSIS — I480 Paroxysmal atrial fibrillation: Secondary | ICD-10-CM

## 2021-04-01 DIAGNOSIS — E119 Type 2 diabetes mellitus without complications: Secondary | ICD-10-CM

## 2021-04-01 DIAGNOSIS — I10 Essential (primary) hypertension: Secondary | ICD-10-CM

## 2021-04-01 DIAGNOSIS — E785 Hyperlipidemia, unspecified: Secondary | ICD-10-CM

## 2021-04-01 DIAGNOSIS — E1169 Type 2 diabetes mellitus with other specified complication: Secondary | ICD-10-CM

## 2021-04-01 DIAGNOSIS — N1831 Chronic kidney disease, stage 3a: Secondary | ICD-10-CM

## 2021-04-01 LAB — BASIC METABOLIC PANEL
BUN/Creatinine Ratio: 10 (ref 10–24)
BUN: 18 mg/dL (ref 8–27)
CO2: 18 mmol/L — ABNORMAL LOW (ref 20–29)
Calcium: 9.1 mg/dL (ref 8.6–10.2)
Chloride: 105 mmol/L (ref 96–106)
Creatinine, Ser: 1.73 mg/dL — ABNORMAL HIGH (ref 0.76–1.27)
Glucose: 326 mg/dL — ABNORMAL HIGH (ref 70–99)
Potassium: 4.3 mmol/L (ref 3.5–5.2)
Sodium: 139 mmol/L (ref 134–144)
eGFR: 42 mL/min/{1.73_m2} — ABNORMAL LOW (ref >59)

## 2021-04-01 NOTE — Progress Notes (Cosign Needed)
Chronic Care Management Pharmacy Note  04/02/2021 Name:  Jaquell Seddon MRN:  [id number] DOB:  17-Aug-1950  Summary: -Pt was recently prescribed Freestyle Libre per endocrine, however it was not covered so changed to Dexcom last week - sensors were ordered to [facility] DME. Patient reports he has not heard anything since. Attempted to set him up with Dexcom G6 sample today however his phone is not compatible with Dexcom app so he will need the Receiver device to operate the system -Pt reports fasting BG 140-150s. He does not take his insulin if BG is <130. He is not checking BG at other times of day. Discussed since A1c is 12.5% his BG is likely much higher (300-400) later in the day  Recommendations/Changes made from today's visit: -Ordered Dexcom G6 (sensor, transmitter and receiver) to [employer], who will determine coverage and set up delivery through the appropriate DME -Advised pt to call me when he receives Dexcom supplies so we can set up Receiver together -Advised pt to check BG twice daily in the meantime (AM and bedtime) and limit carbs   Subjective: Nikolas Casher is an 70 y.o. year old male who is a primary patient of Hoyt Koch, MD.  The CCM team was consulted for assistance with disease management and care coordination needs.    Engaged with patient by telephone for follow up visit in response to provider referral for pharmacy case management and/or care coordination services.   Consent to Services:  The patient was given information about Chronic Care Management services, agreed to services, and gave verbal consent prior to initiation of services.  Please see initial visit note for detailed documentation.   Patient Care Team: Hoyt Koch, MD as PCP - General (Internal Medicine) Deboraha Sprang, MD as PCP - Electrophysiology (Cardiology) Charlton Haws, [facility] as Pharmacist (Pharmacist)  Patient lives at home alone. 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  Recent office visits: 03/13/21 Dr Sharlet Salina OV: Physical - A1c 12.5, advised to see endo.  07/29/20 Dr Sharlet Salina OV: acute visit for recent food poisoning, improving. Advised Tums/Rolaids.  07/18/20 Dr Sharlet Salina OV: acute visit for constipation. Rx'd Linzess.  06/17/20 Dr Sharlet Salina OV: chronic f/u. Pt reports urologist told him to stop amlodipine and actos- per PCP's review of note no evidence of this. Advised to stop glipizide when starting Lantus (per endocrine). K low, switched HCTZ to triamterene/HCTZ and advised to stop potassium.  Recent consult visits: 03/26/21 Dr Loanne Drilling (endocrine): A1c 12.5; refilled Freestyle sensors. Update - Freestyle not covered, Dexcom sensors sent to [employer].  03/24/21 PA Tillery (cardiology): f/u SSS, Afib. Stop amlodipine, start Metoprolol succinate 25 mg daily.  12/11/20 RD Leonia Reader (DM/nutrition): [employer] education.  11/26/20 Dr Loanne Drilling (endocrine): pt skips insulin several times per week d/t CBG in 100s.  09/23/20 Dr Loanne Drilling (endocrine): A1c improved to 9.5. Hypoglycemia. Changed Lantus to AM. Rx'd Freestyle Libre.  09/03/20 Dr Royce Macadamia (nephrology): pt should have stopped HCTZ, potassium last visit. K 3.5, GFR 47. Started Farxiga 10 mg. Started amlodipine 2.5 mg daily. (Believes pt to be off HCTZ, triamterene/HCTZ)  08/19/20 RD Antonieta Iba (CDE): DM/ nutrition education  08/02/20 Dr Loanne Drilling (endocrine): f/u DM. New hypoTH. Rx'd levothyroxine. Frucosamine 569 and A1c 10.4. Referred to CDE.  06/05/20 Dr Royce Macadamia (nephrology): stop HCTZ and chromium.  Hospital visits: None in previous 6 months 07/14/20 ED visit -  [facility] for constipation. Disimpaction at bedside. Rec'd Miralax and hydration   Objective:  Lab Results  Component Value Date   CREATININE 1.73 (H) 04/01/2021   BUN 18 04/01/2021   GFR 38.42 (L) 03/13/2021   GFRNONAA 45  (L) 07/14/2020   GFRAA 54 (L) 11/29/2018   NA 139 04/01/2021   K 4.3 04/01/2021   CALCIUM 9.1 04/01/2021   CO2 18 (L) 04/01/2021   GLUCOSE 326 (H) 04/01/2021    Lab Results  Component Value Date/Time   HGBA1C 12.5 (A) 03/26/2021 10:10 AM   HGBA1C 12.5 (H) 03/13/2021 10:21 AM   HGBA1C 9.5 (A) 09/23/2020 09:02 AM   HGBA1C 10.6 (H) 04/20/2019 08:55 AM   FRUCTOSAMINE 569 (H) 08/02/2020 08:15 AM   FRUCTOSAMINE 442 (H) 02/01/2020 08:14 AM   GFR 38.42 (L) 03/13/2021 10:21 AM   GFR 42.35 (L) 06/17/2020 03:40 PM   MICROALBUR 2.1 (H) 03/13/2021 10:21 AM   MICROALBUR 0.9 06/17/2020 03:40 PM    Last diabetic Eye exam:  Lab Results  Component Value Date/Time   HMDIABEYEEXA No Retinopathy 06/07/2017 12:00 AM    Last diabetic Foot exam: No results found for: HMDIABFOOTEX   Lab Results  Component Value Date   CHOL 119 03/13/2021   HDL 41.30 03/13/2021   LDLCALC 57 03/13/2021   LDLDIRECT 102.0 04/20/2019   TRIG 107.0 03/13/2021   CHOLHDL 3 03/13/2021    Hepatic Function Latest Ref Rng & Units 03/13/2021 07/14/2020 06/17/2020  Total Protein 6.0 - 8.3 g/dL 6.7 7.3 6.9  Albumin 3.5 - 5.2 g/dL 4.0 4.7 4.2  AST 0 - 37 U/L 34 39 26  ALT 0 - 53 U/L _0 Alk Phosphatase 39 - 117 U/L 77 72 78  Total Bilirubin 0.2 - 1.2 mg/dL 0.8 1.1 0.7  Bilirubin, Direct 0.01 - 0.4 - - -    Lab Results  Component Value Date/Time   TSH 4.65 (H) 08/02/2020 08:11 AM   TSH 2.36 06/10/2017 03:09 PM    CBC Latest Ref Rng & Units 03/13/2021 07/14/2020 06/17/2020  WBC 4.0 - 10.5 K/uL 6.0 9.7 6.7  Hemoglobin 13.0 - 17.0 g/dL 12.6(L) 13.5 12.7(L)  Hematocrit 39.0 - 52.0 % 38.5(L) 39.8 38.4(L)  Platelets 150.0 - 400.0 K/uL 147.0(L) 171 135.0(L)    No results found for: VD25OH  Clinical ASCVD: No  The ASCVD Risk score (Arnett DK, et al., 2019) failed to calculate for the following reasons:   The valid total cholesterol range is 130 to 320 mg/dL    Depression screen [facility] 2/9 03/13/2021 08/19/2020 02/20/2020   Decreased Interest 0 0 0  Down, Depressed, Hopeless 0 0 0  PHQ - 2 Score 0 0 0  Some recent data might be hidden    CHA2DS2-VASc Score = 3  The patient's score is based upon: CHF History: 0 HTN History: 1 Diabetes History: 1 Stroke History: 0 Vascular Disease History: 0 Age Score: 1 Gender Score: 0    Social History   Tobacco Use  Smoking Status Never  Smokeless Tobacco Never   BP Readings from Last 3 Encounters:  03/26/21 118/80  03/24/21 (!) 140/92  03/13/21 110/70   Pulse Readings from Last 3 Encounters:  03/26/21 78  03/24/21 81  03/13/21 91   Wt Readings from Last 3 Encounters:  03/26/21 156 lb 3.2 oz (70.9 kg)  03/24/21 158 lb 3.2 oz (71.8 kg)  03/13/21 157 lb (71.2 kg)   BMI Readings from Last 3 Encounters:  03/26/21 21.18 kg/m  03/24/21 21.46 kg/m  03/13/21 21.29 kg/m    Assessment/Interventions: Review of patient past medical history, allergies, medications, health status, including review of consultants reports, laboratory and other test data, was performed as part of comprehensive evaluation and provision of chronic care management services.   SDOH:  (Social Determinants of Health) assessments and interventions performed: Yes  SDOH Screenings   Alcohol Screen: Not on file  Depression (PHQ2-9): Low Risk    PHQ-2 Score: 0  Financial Resource Strain: Not on file  Food Insecurity: Not on file  Housing: Not on file  Physical Activity: Not on file  Social Connections: Not on file  Stress: Not on file  Tobacco Use: Low Risk    Smoking Tobacco Use: Never   Smokeless Tobacco Use: Never  Transportation Needs: Not on file    [place]  Allergies  Allergen Reactions   Clindamycin/Lincomycin Swelling    Possible facial and lip swelling   Ace Inhibitors     Facial and lip swelling possibly associated    Medications Reviewed Today     Reviewed by Charlton Haws, [facility] (Pharmacist) on 04/02/21 at 1003  Med List Status: <None>    Medication Order Taking? Sig Documenting Provider Last Dose Status Informant  aspirin EC 81 MG tablet [id number] Yes Take 81 mg daily by mouth. [provider] Taking Active   atorvastatin (LIPITOR) 20 MG tablet [id number] Yes Take 1 tablet (20 mg total) by mouth daily. Hoyt Koch, MD Taking Active   Blood Pressure Monitoring (BLOOD PRESSURE KIT) [place] [id number] Yes 1 kit by Does not apply route daily. Hoyt Koch, MD Taking Active   brimonidine [facility]) 0.2 % ophthalmic solution [id number] Yes SMARTSIG:In Eye(s) [provider] Taking Active   Calcium Carbonate-Vitamin D (CALTRATE 600+D) 600-400 MG-UNIT per tablet [id number] Yes Take 1 tablet by mouth 2 (two) times daily. Norins, Heinz Knuckles, MD Taking Active   Cholecalciferol (VITAMIN D) 50 MCG (2000 UT) tablet [id number] Yes Take 2,000 Units by mouth daily. [provider] Taking Active   Continuous Blood Gluc Receiver ([place]) [place] [id number]  Sync with Dexcom sensor to track glucose Hoyt Koch, MD  Active   Continuous Blood Gluc Sensor (DEXCOM G6 SENSOR) [place] [id number]  1 Device by Does not apply route See admin instructions. Change every 10 days Hoyt Koch, MD  Active   Continuous Blood Gluc Transmit (DEXCOM G6 TRANSMITTER) MISC [id number]  Use with Dexcom sensor to track glucose Hoyt Koch, MD  Active   dapagliflozin propanediol (FARXIGA) 10 MG TABS tablet [id number] Yes Take 10 mg by mouth daily. Claudia Desanctis, MD Taking Active   hydrochlorothiazide (MICROZIDE) 12.5 MG capsule [id number] Yes Take 12.5 mg by mouth daily. Claudia Desanctis, MD Taking Active   insulin glargine (LANTUS SOLOSTAR) 100 UNIT/ML Solostar Pen [id number] Yes Inject 10 Units into the skin every morning. And pen needles 1/day Renato Shin, MD Taking Active   Lancets (ONETOUCH DELICA PLUS [id number]) [place] [id number] Yes CHECK BLOOD SUGAR TWICE  DAILY Hoyt Koch, MD Taking Active    latanoprost (XALATAN) 0.005 % ophthalmic solution [id number] Yes SMARTSIG:In Eye(s) [provider] Taking Active   levothyroxine (SYNTHROID) 25 MCG tablet [id number] Yes Take 1 tablet (25 mcg total) by mouth daily before breakfast. Renato Shin, MD Taking Active   linaclotide [facility]) 145 MCG CAPS capsule [id number] Yes Take 1 capsule (145 mcg total) by mouth daily before breakfast. Hoyt Koch, MD Taking Active   metoprolol  succinate (TOPROL XL) 25 MG 24 hr tablet [id number] Yes Take 1 tablet (25 mg total) by mouth daily. Shirley Friar, PA-C Taking Active   Multiple Vitamin (MULTIVITAMIN WITH MINERALS) TABS tablet [id number] Yes Take 1 tablet by mouth daily. [provider] Taking Active Self  Omega-3 Fatty Acids (FISH OIL) 300 MG CAPS [id number] Yes Take 1 capsule (300 mg total) by mouth daily. Norins, Heinz Knuckles, MD Taking Active Self  Roma Schanz test strip [id number] Yes TEST TWICE DAILY Hoyt Koch, MD Taking Active   pioglitazone (ACTOS) 30 MG tablet [id number] Yes Take 1 tablet (30 mg total) by mouth daily. Renato Shin, MD Taking Active   polyethylene glycol powder [facility]) 17 GM/SCOOP powder [id number] Yes Take 17 g by mouth 2 (two) times daily as needed. Carlisle Cater, PA-C Taking Active   potassium chloride 20 MEQ TBCR [id number] Yes Take 20 mEq by mouth 2 (two) times daily. Shirley Friar, PA-C Taking Active   RYBELSUS 14 MG TABS [id number] Yes TAKE 1 TABLET BY MOUTH  DAILY Renato Shin, MD Taking Active   triamcinolone ointment (KENALOG) 0.5 % [id number] Yes APPLY TO AFFECTED AREA(S)  TOPICALLY TWICE DAILY Hoyt Koch, MD Taking Active             Patient Active Problem List   Diagnosis Date Noted   Hypothyroidism 08/03/2020   Constipation 07/18/2020   De Quervain's tenosynovitis, left 03/14/2019   Left wrist pain 09/23/2018   Hepatitis C virus infection cured after antiviral drug therapy 12/24/2015    Cataract, right eye 11/13/2014   Cataract, left eye 67/61/9509   Complications, pacemaker cardiac, 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/2014   Atrial fibrillation ([place]) 11/17/2012   Cardiac pacemaker MDT 11/26/2011   Routine health maintenance 06/16/2011   Essential hypertension 06/01/2008   Diabetes mellitus type 2 with complications ([place]) 32/67/1245   SICK SINUS SYNDROME 03/26/2007   SEIZURE DISORDER 03/26/2007    Immunization History  Administered Date(s) Administered   Fluad Quad(high Dose 65+) 04/20/2019, 07/29/2020, 03/13/2021   Influenza Whole 03/30/2008, 04/16/2009, 06/05/2010   Influenza, High Dose Seasonal PF 06/02/2016, 04/05/2017, 03/29/2018   Influenza,inj,Quad PF,6+ Mos 05/16/2013, 03/08/2014, 08/21/2015   PFIZER(Purple Top)SARS-COV-2 Vaccination 10/27/2019, 11/17/2019, 05/28/2020, 10/24/2020   Pneumococcal Conjugate-13 11/01/2014   Pneumococcal Polysaccharide-23 09/13/2012, 12/30/2017   Td 03/30/2008   Tdap 05/02/2018   Zoster Recombinat (Shingrix) 12/30/2017   Zoster, Live 07/20/2014    Conditions to be addressed/monitored:  Hypertension, Hyperlipidemia, Diabetes, Atrial Fibrillation, and Chronic Kidney Disease  Care Plan : [place]  Updates made by Charlton Haws, [place] since 04/02/2021 12:00 AM     Problem: Hypertension, Hyperlipidemia, Diabetes, Atrial Fibrillation, and Chronic Kidney Disease   Priority: High     Long-Range Goal: Disease management   Start Date: 12/26/2020  Expected End Date: 06/27/2021  This Visit's Progress: Not on track  Recent Progress: On track  Priority: High  Note:   Current Barriers:  Unable to independently monitor therapeutic efficacy Unable to maintain control of diabetes  Pharmacist Clinical Goal(s):  Patient will achieve adherence to monitoring guidelines and medication adherence to achieve therapeutic efficacy adhere to plan to optimize therapeutic regimen for diabetes as evidenced by report of adherence to recommended  medication management changes through collaboration with PharmD and provider.   Interventions: 1:1 collaboration with Hoyt Koch, MD regarding development and update of comprehensive plan of care as evidenced by provider attestation and co-signature Inter-disciplinary care team collaboration (see longitudinal plan of care) Comprehensive medication review performed;  medication list updated in electronic medical record   AFIB / Hypertension / CKD    Hx sick sinus syndrome s/p PPM  BP goal is:  <130/80 Patient checks BP at home daily Patient home BP readings are ranging: 110/83   Patient has tried/failed these meds in past:  triamterene/hctz, amlodipine Patient is currently controlled on the following medications: Metoprolol succinate 25 mg daily (Tillery, PA) Potassium ER 10 mEq BID (Dr Royce Macadamia) Aspirin 81 mg daily   We discussed: pt reports compliance with medications above; he recently was changed from amlodipine to metoprolol; he reports BP at home is "good"   Plan:  Recommend to continue current medication  Diabetes    A1c goal <8% Checking BG: Daily Recent FBG Readings: 141, 123 (not checking at other times)   Patient has failed these meds in past: glipizide Patient is currently uncontrolled on the following medications: Lantus 10 units qAM  Rybelsus 14 mg daily Pioglitazone 30 mg daily Farxiga 10 mg daily (Dr Royce Macadamia) Dexcom G6 - not yet received   We discussed: A1c is 12.5%, equating to average BG ~315; discussed BG is likely much higher later in the day after eating, since fasting BG are near goal; counseled on limiting high-carb foods (potatoes, bread, pasta, rice) as well as sugary drinks;  -pt has been prescribed freestyle libre, but per [facility] it was not covered by insurance and Dexcom is preferred; Dexcom sensors were sent to [place] last week per endocrine, pt reports he has not heard anything more -Attempted to provide sample for Dexcom G6 sensor and  transmitter, however pt's phone is not compatible with the Dexcom app so he will need the receiver device; patient was shown how to apply the Dexcom sensor and transmitter, and I explained how the sensor continuously sends sugar readings to the receiver. -Ordered Dexcom supplies to [place] who will determine insurance coverage and set patient's delivery up through the appropriate DME; advised patient he may receive a phone call to complete the process; it will likely take a couple weeks before patient receives the supplies. In the meantime advised patient to check BG at bedtime as well as AM. Advised patient to contact me when he receives his supplies and I can go over how to set up the receiver   Plan: Ordered Dexcom G6 (sensor, transmitter, and receiver) to [place] pt to check BG twice daily- AM and bedtime Advised pt to limit carbs  Hyperlipidemia    LDL goal < 100  Patient has failed these meds in past: n/a Patient is currently controlled on the following medications: Atorvastatin 20 mg daily Omega-3 Fish oil 300 mg daily   We discussed: pt endorses compliance and denies issues; LDL is near goal but has not been checked since 04/2019   Plan: Continue current medications; check lipid panel this year  Patient Goals/Self-Care Activities Patient will:  - take medications as prescribed -focus on medication adherence by pill box -check glucose twice a day (AM and bedtime) -check blood pressure twice a week -Contact CCM Pharmacist when Dexcom G6 arrives in the mail - we will set up the Receiver device together     Medication Assistance: None required.  Patient affirms current coverage meets needs.  Compliance/Adherence/Medication fill history: Care Gaps: Shingrix (2nd dose - due 02/24/18) Covid booster (due 09/25/20) Eye exam (due 06/07/18)  Star-Rating Drugs: Atorvastatin - LF 03/24/21 x 90 ds ([facility]) Farxiga - LF 03/22/21 x 30 ds ([facility]) Pioglitazone - LF  02/11/21 x 90  ds (Mail order) Rybelsus - LF 02/11/21 x 90 ds (Mail order)  Patient's preferred pharmacy is:  [facility]  ([place]) - [place], [place] [address] [place] [address] Phone: [phone] Fax: [phone]  [place]), [place] [address] [place] ([place]) [place] [address] Phone: [phone] Fax: [phone]  [place] [place] [address] Phone: [phone] Fax: [phone]  [facility] (New Address) - [place], [place] [address] Previously: [address], [address] [place] [address] [place] [address] Phone: [phone] Fax: [phone]  Uses pill box? Yes Pt endorses 100% compliance  We discussed: Current pharmacy is preferred with insurance plan and patient is satisfied with pharmacy services Patient decided to: Continue current medication management strategy  Care Plan and Follow Up Patient Decision:  Patient agrees to Care Plan and Follow-up.  Plan: Telephone follow up appointment with care management team member scheduled for:  1 month  Charlene Brooke, PharmD, [place], CPP Clinical Pharmacist [facility] [phone]

## 2021-04-02 MED ORDER — DEXCOM G6 TRANSMITTER MISC: 3 refills | Status: DC

## 2021-04-02 MED ORDER — DEXCOM G6 SENSOR MISC: 1.0000 | 3 refills | Status: DC

## 2021-04-02 MED ORDER — DEXCOM G6 RECEIVER DEVI
0 refills | Status: DC
Start: 2021-04-02 — End: 2021-06-02

## 2021-04-02 NOTE — Patient Instructions (Signed)
Visit Information  Phone number for Pharmacist: [phone]   Goals Addressed             This Visit's Progress    Manage My Medicine       Timeframe:  Long-Range Goal Priority:  High Start Date:    12/26/20                         Expected End Date:    06/27/21                   Follow Up Date: Oct 2022   - call for medicine refill 2 or 3 days before it runs out - call if I am sick and can't take my medicine - keep a list of all the medicines I take; vitamins and herbals too - use a pillbox to sort medicine     Why is this important?   These steps will help you keep on track with your medicines.   Notes:      Monitor and Manage My Blood Sugar-Diabetes Type 2       Timeframe:  Long-Range Goal Priority:  High Start Date:    04/01/21                         Expected End Date:    09/29/21                   Follow Up Date Oct 2022   - check blood sugar at prescribed times (morning and bedtime) - check blood sugar if I feel it is too high or too low - take the blood sugar meter to all doctor visits  - Await phone call for Dexcom G6 delivery - Contact CCM pharmacist Mardella Layman when Dexcom G6 is delivered - can set up Receiver device together   Why is this important?   Checking your blood sugar at home helps to keep it from getting very high or very low.  Writing the results in a diary or log helps the doctor know how to care for you.  Your blood sugar log should have the time, date and the results.  Also, write down the amount of insulin or other medicine that you take.  Other information, like what you ate, exercise done and how you were feeling, will also be helpful.     Notes:         Care Plan : CCM Pharmacy Care Plan  Updates made by Kathyrn Sheriff, RPH since 04/02/2021 12:00 AM     Problem: Hypertension, Hyperlipidemia, Diabetes, Atrial Fibrillation, and Chronic Kidney Disease   Priority: High     Long-Range Goal: Disease management   Start Date:  12/26/2020  Expected End Date: 06/27/2021  This Visit's Progress: Not on track  Recent Progress: On track  Priority: High  Note:   Current Barriers:  Unable to independently monitor therapeutic efficacy Unable to maintain control of diabetes  Pharmacist Clinical Goal(s):  Patient will achieve adherence to monitoring guidelines and medication adherence to achieve therapeutic efficacy adhere to plan to optimize therapeutic regimen for diabetes as evidenced by report of adherence to recommended medication management changes through collaboration with PharmD and provider.   Interventions: 1:1 collaboration with Myrlene Broker, MD regarding development and update of comprehensive plan of care as evidenced by provider attestation and co-signature Inter-disciplinary care team collaboration (see longitudinal plan of care) Comprehensive medication review performed;  medication list updated in electronic medical record   AFIB / Hypertension / CKD    Hx sick sinus syndrome s/p PPM  BP goal is:  <130/80 Patient checks BP at home daily Patient home BP readings are ranging: 110/83   Patient has tried/failed these meds in past:  triamterene/hctz, amlodipine Patient is currently controlled on the following medications: Metoprolol succinate 25 mg daily ([place], [place]) Potassium ER 10 mEq BID (Dr Malen Gauze) Aspirin 81 mg daily   We discussed: pt reports compliance with medications above; he recently was changed from amlodipine to metoprolol; he reports BP at home is "good"   Plan:  Recommend to continue current medication  Diabetes    A1c goal <8% Checking BG: Daily Recent FBG Readings: 141, 123 (not checking at other times)   Patient has failed these meds in past: glipizide Patient is currently uncontrolled on the following medications: Lantus 10 units qAM  Rybelsus 14 mg daily Pioglitazone 30 mg daily Farxiga 10 mg daily (Dr Malen Gauze) Dexcom G6 - not yet received   We discussed: A1c  is 12.5%, equating to average BG ~315; discussed BG is likely much higher later in the day after eating, since fasting BG are near goal; counseled on limiting high-carb foods (potatoes, bread, pasta, rice) as well as sugary drinks;  -pt has been prescribed freestyle libre, but per [facility] it was not covered by insurance and Dexcom is preferred; Dexcom sensors were sent to [facility] last week per endocrine, pt reports he has not heard anything more -Attempted to provide sample for Dexcom G6 sensor and transmitter, however pt's phone is not compatible with the Dexcom app so he will need the receiver device; patient was shown how to apply the Dexcom sensor and transmitter, and I explained how the sensor continuously sends sugar readings to the receiver. -Ordered Dexcom supplies to [facility] pharmacy who will determine insurance coverage and set patient's delivery up through the appropriate DME; advised patient he may receive a phone call to complete the process; it will likely take a couple weeks before patient receives the supplies. In the meantime advised patient to check BG at bedtime as well as AM. Advised patient to contact me when he receives his supplies and I can go over how to set up the receiver   Plan: Ordered Dexcom G6 (sensor, transmitter, and receiver) to [facility] pharmacy Advised pt to check BG twice daily- AM and bedtime Advised pt to limit carbs  Hyperlipidemia    LDL goal < 100  Patient has failed these meds in past: n/a Patient is currently controlled on the following medications: Atorvastatin 20 mg daily Omega-3 Fish oil 300 mg daily   We discussed: pt endorses compliance and denies issues; LDL is near goal but has not been checked since 04/2019   Plan: Continue current medications; check lipid panel this year  Patient Goals/Self-Care Activities Patient will:  - take medications as prescribed -focus on medication adherence by pill box -check glucose twice a day (AM and bedtime) -check  blood pressure twice a week -Contact CCM Pharmacist when Dexcom G6 arrives in the mail - we will set up the Receiver device together      Patient verbalizes understanding of instructions provided today and agrees to view in MyChart.  Telephone follow up appointment with pharmacy team member scheduled for: 1 month  Al Corpus, PharmD, [place], CPP Clinical Pharmacist  Primary Care at [facility] [phone]

## 2021-04-04 DIAGNOSIS — I10 Essential (primary) hypertension: Secondary | ICD-10-CM | POA: Diagnosis not present

## 2021-04-04 DIAGNOSIS — Z794 Long term (current) use of insulin: Secondary | ICD-10-CM

## 2021-04-04 DIAGNOSIS — E1169 Type 2 diabetes mellitus with other specified complication: Secondary | ICD-10-CM | POA: Diagnosis not present

## 2021-04-04 DIAGNOSIS — E119 Type 2 diabetes mellitus without complications: Secondary | ICD-10-CM | POA: Diagnosis not present

## 2021-04-04 DIAGNOSIS — I480 Paroxysmal atrial fibrillation: Secondary | ICD-10-CM

## 2021-04-04 DIAGNOSIS — E785 Hyperlipidemia, unspecified: Secondary | ICD-10-CM | POA: Diagnosis not present

## 2021-04-04 DIAGNOSIS — N1831 Chronic kidney disease, stage 3a: Secondary | ICD-10-CM | POA: Diagnosis not present

## 2021-04-07 ENCOUNTER — Encounter: Payer: Self-pay | Admitting: Internal Medicine

## 2021-04-08 NOTE — Telephone Encounter (Signed)
Patient called to advise that [facility] still needs supporting documentation for Dexcom G6 fulfillment - call back # for patient [phone]

## 2021-04-09 DIAGNOSIS — E119 Type 2 diabetes mellitus without complications: Secondary | ICD-10-CM | POA: Diagnosis not present

## 2021-04-09 DIAGNOSIS — Z794 Long term (current) use of insulin: Secondary | ICD-10-CM | POA: Diagnosis not present

## 2021-04-09 NOTE — Telephone Encounter (Signed)
Spoke with pt to let hm that [employer] the information to [place] instead of [facility]. I gave the pt [facility]'s # to F/U with them

## 2021-04-15 ENCOUNTER — Encounter: Payer: Medicare Other | Attending: Endocrinology | Admitting: Dietician

## 2021-04-15 ENCOUNTER — Other Ambulatory Visit: Payer: Self-pay

## 2021-04-15 DIAGNOSIS — E118 Type 2 diabetes mellitus with unspecified complications: Secondary | ICD-10-CM | POA: Insufficient documentation

## 2021-04-15 NOTE — Progress Notes (Signed)
Dexcom G6 Personal CGM Training  Start time:  0825    End time: 0900 Total time: 35 Michael Snyder was educated about the following:  -Getting to know device    (Receiver:Phone programmed ) -Setting up device (high alert  250  , low alert 75  ) -Inserting sensor (  left abdomen      WNL) -Calibrating- none required for G6 -Ending sensor session -Trouble shooting -Tape guide, clarity information  -Reviewed insulin dosing from dexcom.   Patient has [facility] tech support and my contact information.  Oran Rein, RD, LDN, CDCES

## 2021-04-17 ENCOUNTER — Ambulatory Visit (INDEPENDENT_AMBULATORY_CARE_PROVIDER_SITE_OTHER): Payer: Medicare Other | Admitting: Pharmacist

## 2021-04-17 ENCOUNTER — Other Ambulatory Visit: Payer: Self-pay

## 2021-04-17 DIAGNOSIS — E119 Type 2 diabetes mellitus without complications: Secondary | ICD-10-CM

## 2021-04-17 DIAGNOSIS — N1832 Chronic kidney disease, stage 3b: Secondary | ICD-10-CM

## 2021-04-17 DIAGNOSIS — I480 Paroxysmal atrial fibrillation: Secondary | ICD-10-CM

## 2021-04-17 DIAGNOSIS — E785 Hyperlipidemia, unspecified: Secondary | ICD-10-CM

## 2021-04-17 DIAGNOSIS — E1169 Type 2 diabetes mellitus with other specified complication: Secondary | ICD-10-CM

## 2021-04-17 DIAGNOSIS — Z794 Long term (current) use of insulin: Secondary | ICD-10-CM

## 2021-04-17 DIAGNOSIS — I10 Essential (primary) hypertension: Secondary | ICD-10-CM

## 2021-04-17 NOTE — Progress Notes (Signed)
Chronic Care Management Pharmacy Note  04/17/2021 Name:  Michael Snyder MRN:  [id number] DOB:  01-09-1951  Summary: -Pt is wearing Dexcom G6 (received 30-ds of samples from endocrine) and reports he has a form he needs to send back to [facility] to start receiving shipments  Recommendations/Changes made from today's visit: -Advised pt to contact myself or endocrine if he has problems with his Dexcom shipment from DME   Subjective: Michael Snyder is an 70 y.o. year old male who is a primary patient of Myrlene Broker, MD.  The CCM team was consulted for assistance with disease management and care coordination needs.    Engaged with patient by telephone for follow up visit in response to provider referral for pharmacy case management and/or care coordination services.   Consent to Services:  The patient was given information about Chronic Care Management services, agreed to services, and gave verbal consent prior to initiation of services.  Please see initial visit note for detailed documentation.   Patient Care Team: Myrlene Broker, MD as PCP - General (Internal Medicine) Duke Salvia, MD as PCP - Electrophysiology (Cardiology) Kathyrn Sheriff, [facility] as Pharmacist (Pharmacist)  Patient lives at home alone. 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  Recent office visits: 03/13/21 Dr Okey Dupre OV: Physical - A1c 12.5, advised to see endo.  07/29/20 Dr Okey Dupre OV: acute visit for recent food poisoning, improving. Advised Tums/Rolaids.  07/18/20 Dr Okey Dupre OV: acute visit for constipation. Rx'd Linzess.  06/17/20 Dr Okey Dupre OV: chronic f/u. Pt reports urologist told him to stop amlodipine and actos- per PCP's review of note no evidence of this. Advised to stop glipizide when starting Lantus (per endocrine). K low, switched HCTZ to triamterene/HCTZ and advised to stop  potassium.  Recent consult visits: 04/15/21 Nutrition - Dexcom G6 training  03/26/21 Dr Everardo All (endocrine): A1c 12.5; refilled Freestyle sensors. Update - Freestyle not covered, Dexcom sensors sent to [employer].  03/24/21 PA Tillery (cardiology): f/u SSS, Afib. Stop amlodipine, start Metoprolol succinate 25 mg daily.  12/11/20 RD Cristy Folks (DM/nutrition): [employer] education.  11/26/20 Dr Everardo All (endocrine): pt skips insulin several times per week d/t CBG in 100s.  09/23/20 Dr Everardo All (endocrine): A1c improved to 9.5. Hypoglycemia. Changed Lantus to AM. Rx'd Freestyle Libre.  09/03/20 Dr Malen Gauze (nephrology): pt should have stopped HCTZ, potassium last visit. K 3.5, GFR 47. Started Farxiga 10 mg. Started amlodipine 2.5 mg daily. (Believes pt to be off HCTZ, triamterene/HCTZ)  08/19/20 RD Oran Rein (CDE): DM/ nutrition education  08/02/20 Dr Everardo All (endocrine): f/u DM. New hypoTH. Rx'd levothyroxine. Frucosamine 569 and A1c 10.4. Referred to CDE.  06/05/20 Dr Malen Gauze (nephrology): stop HCTZ and chromium.  Hospital visits: None in previous 6 months 07/14/20 ED visit - [facility] for constipation. Disimpaction at bedside. Rec'd Miralax and hydration   Objective:  Lab Results  Component Value Date   CREATININE 1.73 (H) 04/01/2021   BUN 18 04/01/2021   GFR 38.42 (L) 03/13/2021   GFRNONAA 45 (L) 07/14/2020   GFRAA 54 (L) 11/29/2018   NA 139 04/01/2021   K 4.3 04/01/2021   CALCIUM 9.1 04/01/2021   CO2 18 (L) 04/01/2021   GLUCOSE 326 (H) 04/01/2021    Lab Results  Component Value Date/Time   HGBA1C 12.5 (A) 03/26/2021 10:10 AM   HGBA1C 12.5 (H) 03/13/2021 10:21 AM   HGBA1C  9.5 (A) 09/23/2020 09:02 AM   HGBA1C 10.6 (H) 04/20/2019 08:55 AM   FRUCTOSAMINE 569 (H) 08/02/2020 08:15 AM   FRUCTOSAMINE 442 (H) 02/01/2020 08:14 AM   GFR 38.42 (L) 03/13/2021 10:21 AM   GFR 42.35 (L) 06/17/2020 03:40 PM   MICROALBUR 2.1 (H) 03/13/2021 10:21 AM   MICROALBUR 0.9 06/17/2020  03:40 PM    Last diabetic Eye exam:  Lab Results  Component Value Date/Time   HMDIABEYEEXA No Retinopathy 06/07/2017 12:00 AM    Last diabetic Foot exam: No results found for: HMDIABFOOTEX   Lab Results  Component Value Date   CHOL 119 03/13/2021   HDL 41.30 03/13/2021   LDLCALC 57 03/13/2021   LDLDIRECT 102.0 04/20/2019   TRIG 107.0 03/13/2021   CHOLHDL 3 03/13/2021    Hepatic Function Latest Ref Rng & Units 03/13/2021 07/14/2020 06/17/2020  Total Protein 6.0 - 8.3 g/dL 6.7 7.3 6.9  Albumin 3.5 - 5.2 g/dL 4.0 4.7 4.2  AST 0 - 37 U/L 34 39 26  ALT 0 - 53 U/L $Remo'29 30 19  'AKhZr$ Alk Phosphatase 39 - 117 U/L 77 72 78  Total Bilirubin 0.2 - 1.2 mg/dL 0.8 1.1 0.7  Bilirubin, Direct 0.01 - 0.4 - - -    Lab Results  Component Value Date/Time   TSH 4.65 (H) 08/02/2020 08:11 AM   TSH 2.36 06/10/2017 03:09 PM    CBC Latest Ref Rng & Units 03/13/2021 07/14/2020 06/17/2020  WBC 4.0 - 10.5 K/uL 6.0 9.7 6.7  Hemoglobin 13.0 - 17.0 g/dL 12.6(L) 13.5 12.7(L)  Hematocrit 39.0 - 52.0 % 38.5(L) 39.8 38.4(L)  Platelets 150.0 - 400.0 K/uL 147.0(L) 171 135.0(L)    No results found for: VD25OH  Clinical ASCVD: No  The ASCVD Risk score (Arnett DK, et al., 2019) failed to calculate for the following reasons:   The valid total cholesterol range is 130 to 320 mg/dL    Depression screen [facility] 2/9 03/13/2021 08/19/2020 02/20/2020  Decreased Interest 0 0 0  Down, Depressed, Hopeless 0 0 0  PHQ - 2 Score 0 0 0  Some recent data might be hidden    CHA2DS2-VASc Score = 3  The patient's score is based upon: CHF History: 0 HTN History: 1 Diabetes History: 1 Stroke History: 0 Vascular Disease History: 0 Age Score: 1 Gender Score: 0    Social History   Tobacco Use  Smoking Status Never  Smokeless Tobacco Never   BP Readings from Last 3 Encounters:  03/26/21 118/80  03/24/21 (!) 140/92  03/13/21 110/70   Pulse Readings from Last 3 Encounters:  03/26/21 78  03/24/21 81  03/13/21 91   Wt Readings  from Last 3 Encounters:  03/26/21 156 lb 3.2 oz (70.9 kg)  03/24/21 158 lb 3.2 oz (71.8 kg)  03/13/21 157 lb (71.2 kg)   BMI Readings from Last 3 Encounters:  03/26/21 21.18 kg/m  03/24/21 21.46 kg/m  03/13/21 21.29 kg/m    Assessment/Interventions: Review of patient past medical history, allergies, medications, health status, including review of consultants reports, laboratory and other test data, was performed as part of comprehensive evaluation and provision of chronic care management services.   SDOH:  (Social Determinants of Health) assessments and interventions performed: Yes  SDOH Screenings   Alcohol Screen: Not on file  Depression (PHQ2-9): Low Risk    PHQ-2 Score: 0  Financial Resource Strain: Not on file  Food Insecurity: Not on file  Housing: Not on file  Physical Activity: Not on file  Social  Connections: Not on file  Stress: Not on file  Tobacco Use: Low Risk    Smoking Tobacco Use: Never   Smokeless Tobacco Use: Never  Transportation Needs: Not on file    CCM Care Plan  Allergies  Allergen Reactions   Clindamycin/Lincomycin Swelling    Possible facial and lip swelling   Ace Inhibitors     Facial and lip swelling possibly associated    Medications Reviewed Today     Reviewed by Charlton Haws, [facility] (Pharmacist) on 04/17/21 at [place] List Status: <None>   Medication Order Taking? Sig Documenting Provider Last Dose Status Informant  aspirin EC 81 MG tablet [id number] Yes Take 81 mg daily by mouth. [provider] Taking Active   atorvastatin (LIPITOR) 20 MG tablet [id number] Yes Take 1 tablet (20 mg total) by mouth daily. Hoyt Koch, MD Taking Active   Blood Pressure Monitoring (BLOOD PRESSURE KIT) DEVI [id number] Yes 1 kit by Does not apply route daily. Hoyt Koch, MD Taking Active   brimonidine [facility]) 0.2 % ophthalmic solution [id number] Yes SMARTSIG:In Eye(s) [provider] Taking Active   Calcium  Carbonate-Vitamin D (CALTRATE 600+D) 600-400 MG-UNIT per tablet [id number] Yes Take 1 tablet by mouth 2 (two) times daily. Norins, Heinz Knuckles, MD Taking Active   Cholecalciferol (VITAMIN D) 50 MCG (2000 UT) tablet [id number] Yes Take 2,000 Units by mouth daily. [provider] Taking Active   Continuous Blood Gluc Receiver ([place]) [place] [id number] Yes Sync with Dexcom sensor to track glucose Hoyt Koch, MD Taking Active   Continuous Blood Gluc Sensor ([place]) [place] [id number] Yes 1 Device by Does not apply route See admin instructions. Change every 10 days Hoyt Koch, MD Taking Active   Continuous Blood Gluc Transmit (DEXCOM G6 TRANSMITTER) MISC [id number] Yes Use with Dexcom sensor to track glucose Hoyt Koch, MD Taking Active   dapagliflozin propanediol (FARXIGA) 10 MG TABS tablet [id number] Yes Take 10 mg by mouth daily. Claudia Desanctis, MD Taking Active   hydrochlorothiazide (MICROZIDE) 12.5 MG capsule [id number] Yes Take 12.5 mg by mouth daily. Claudia Desanctis, MD Taking Active   insulin glargine (LANTUS SOLOSTAR) 100 UNIT/ML Solostar Pen [id number] Yes Inject 10 Units into the skin every morning. And pen needles 1/day Renato Shin, MD Taking Active   Lancets (ONETOUCH DELICA PLUS TUUEKC00L) [place] [id number] Yes CHECK BLOOD SUGAR TWICE  DAILY Hoyt Koch, MD Taking Active   latanoprost (XALATAN) 0.005 % ophthalmic solution [id number] Yes SMARTSIG:In Eye(s) [provider] Taking Active   levothyroxine (SYNTHROID) 25 MCG tablet [id number] Yes Take 1 tablet (25 mcg total) by mouth daily before breakfast. Renato Shin, MD Taking Active   linaclotide [facility]) 145 MCG CAPS capsule [id number] Yes Take 1 capsule (145 mcg total) by mouth daily before breakfast. Hoyt Koch, MD Taking Active   metoprolol succinate (TOPROL XL) 25 MG 24 hr tablet [id number] Yes Take 1 tablet (25 mg total) by mouth daily. Shirley Friar, PA-C  Taking Active   Multiple Vitamin (MULTIVITAMIN WITH MINERALS) TABS tablet [id number] Yes Take 1 tablet by mouth daily. [provider] Taking Active Self  Omega-3 Fatty Acids (FISH OIL) 300 MG CAPS [id number] Yes Take 1 capsule (300 mg total) by mouth daily. Norins, Heinz Knuckles, MD Taking Active Self  Roma Schanz test strip [id number] Yes TEST TWICE DAILY Hoyt Koch, MD Taking Active   pioglitazone (ACTOS) 30 MG tablet [id number] Yes Take 1 tablet (30 mg  total) by mouth daily. Renato Shin, MD Taking Active   polyethylene glycol powder [facility]) 17 GM/SCOOP powder [id number] Yes Take 17 g by mouth 2 (two) times daily as needed. Carlisle Cater, PA-C Taking Active   potassium chloride 20 MEQ TBCR [id number] Yes Take 20 mEq by mouth 2 (two) times daily. Shirley Friar, PA-C Taking Active   RYBELSUS 14 MG TABS [id number] Yes TAKE 1 TABLET BY MOUTH  DAILY Renato Shin, MD Taking Active   triamcinolone ointment (KENALOG) 0.5 % [id number] Yes APPLY TO AFFECTED AREA(S)  TOPICALLY TWICE DAILY Hoyt Koch, MD Taking Active             Patient Active Problem List   Diagnosis Date Noted   Hypothyroidism 08/03/2020   Constipation 07/18/2020   De Quervain's tenosynovitis, left 03/14/2019   Left wrist pain 09/23/2018   Hepatitis C virus infection cured after antiviral drug therapy 12/24/2015   Cataract, right eye 11/13/2014   Cataract, left eye 24/40/1027   Complications, pacemaker cardiac, 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/2014   Atrial fibrillation ([place]) 11/17/2012   Cardiac pacemaker MDT 11/26/2011   Routine health maintenance 06/16/2011   Essential hypertension 06/01/2008   Diabetes mellitus type 2 with complications ([place]) 25/36/6440   SICK SINUS SYNDROME 03/26/2007   SEIZURE DISORDER 03/26/2007    Immunization History  Administered Date(s) Administered   Fluad Quad(high Dose 65+) 04/20/2019, 07/29/2020, 03/13/2021   Influenza Whole 03/30/2008, 04/16/2009,  06/05/2010   Influenza, High Dose Seasonal PF 06/02/2016, 04/05/2017, 03/29/2018   Influenza,inj,Quad PF,6+ Mos 05/16/2013, 03/08/2014, 08/21/2015   PFIZER(Purple Top)SARS-COV-2 Vaccination 10/27/2019, 11/17/2019, 05/28/2020, 10/24/2020   Pneumococcal Conjugate-13 11/01/2014   Pneumococcal Polysaccharide-23 09/13/2012, 12/30/2017   Td 03/30/2008   Tdap 05/02/2018   Zoster Recombinat (Shingrix) 12/30/2017   Zoster, Live 07/20/2014    Conditions to be addressed/monitored:  Hypertension, Hyperlipidemia, Diabetes, Atrial Fibrillation, and Chronic Kidney Disease  Care Plan : [place]  Updates made by Charlton Haws, [place] since 04/17/2021 12:00 AM     Problem: Hypertension, Hyperlipidemia, Diabetes, Atrial Fibrillation, and Chronic Kidney Disease   Priority: High     Long-Range Goal: Disease management   Start Date: 12/26/2020  Expected End Date: 06/27/2021  This Visit's Progress: On track  Recent Progress: Not on track  Priority: High  Note:   Current Barriers:  Unable to independently monitor therapeutic efficacy Unable to maintain control of diabetes  Pharmacist Clinical Goal(s):  Patient will achieve adherence to monitoring guidelines and medication adherence to achieve therapeutic efficacy adhere to plan to optimize therapeutic regimen for diabetes as evidenced by report of adherence to recommended medication management changes through collaboration with PharmD and provider.   Interventions: 1:1 collaboration with Hoyt Koch, MD regarding development and update of comprehensive plan of care as evidenced by provider attestation and co-signature Inter-disciplinary care team collaboration (see longitudinal plan of care) Comprehensive medication review performed; medication list updated in electronic medical record   AFIB / Hypertension / CKD    Hx sick sinus syndrome s/p PPM  BP goal is:  <130/80 Patient checks BP at home daily Patient home BP  readings are ranging: 110/83   Patient has tried/failed these meds in past:  triamterene/hctz, amlodipine Patient is currently controlled on the following medications: Metoprolol succinate 25 mg daily (Tillery, PA) Potassium ER 10 mEq BID (Dr Royce Macadamia) Aspirin 81 mg daily   We discussed: pt reports compliance with medications above; he recently was changed from amlodipine to metoprolol; he reports BP at home is "good"  Plan:  Recommend to continue current medication  Diabetes    A1c goal <8% Checking BG: via Dexcom G6 Recent FBG Readings: 160 Recent HG readings: 198   Patient has failed these meds in past: glipizide Patient is currently uncontrolled on the following medications: Lantus 10 units qAM  Rybelsus 14 mg daily Pioglitazone 30 mg daily Farxiga 10 mg daily (Dr Royce Macadamia) Dexcom G6    We discussed: A1c is 12.5%, equating to average BG ~315; discussed BG is likely much higher later in the day after eating, since fasting BG are near goal; counseled on limiting high-carb foods (potatoes, bread, pasta, rice) as well as sugary drinks;  -pt is wearing Dexcom G6 since 10/11 (samples x 30 ds provided from endocrine); he reports the sensor/receiver "disconnected" once after a shower but reconnected shortly after; no interruptions in readings noted; he reports he has not received his shipment from DME but received a form he needs to fill out and send back to them; advised he send this back ASAP   Plan: Recommend to continue current medication; continue CGM  Hyperlipidemia    LDL goal < 100  Patient has failed these meds in past: n/a Patient is currently controlled on the following medications: Atorvastatin 20 mg daily Omega-3 Fish oil 300 mg daily   We discussed: pt endorses compliance and denies issues; LDL is near goal but has not been checked since 04/2019   Plan: Continue current medications; check lipid panel this year  Patient Goals/Self-Care Activities Patient will:  -  take medications as prescribed -focus on medication adherence by pill box -check glucose continuously via Dexcom G6 -check blood pressure twice a week -Complete DEXCOM paperwork and send back to company so they will start regular shipments     Medication Assistance: None required.  Patient affirms current coverage meets needs.  Compliance/Adherence/Medication fill history: Care Gaps: Eye exam (due 06/07/18)  Star-Rating Drugs: Atorvastatin - LF 03/24/21 x 90 ds ([facility]) Farxiga - LF 03/22/21 x 30 ds ([facility]) Pioglitazone - LF 02/11/21 x 90 ds (Mail order) Rybelsus - LF 02/11/21 x 90 ds (Mail order)  Patient's preferred pharmacy is:  [facility]  ([place] - [place], [place] [address] [place] [address] [place] [address] Phone: [phone] Fax: [phone]  [place] - [address] [place] [address] Phone: [phone] Fax: [phone]  [place] [place] [address] Phone: [phone] Fax: [phone]  Uses pill box? Yes Pt endorses 100% compliance  We discussed: Current pharmacy is preferred with insurance plan and patient is satisfied with pharmacy services Patient decided to: Continue current medication management strategy  Care Plan and Follow Up Patient Decision:  Patient agrees to Care Plan and Follow-up.  Plan: Telephone follow up appointment with care management team member scheduled for:  1 month  Charlene Brooke, PharmD, [place], CPP Clinical Pharmacist [facility] [phone]

## 2021-04-17 NOTE — Patient Instructions (Signed)
Visit Information  Phone number for Pharmacist: [phone]   Goals Addressed             This Visit's Progress    Manage My Medicine       Timeframe:  Long-Range Goal Priority:  High Start Date:    12/26/20                         Expected End Date:    06/27/21                   Follow Up Date: Nov 2022   - call for medicine refill 2 or 3 days before it runs out - call if I am sick and can't take my medicine - keep a list of all the medicines I take; vitamins and herbals too - use a pillbox to sort medicine     Why is this important?   These steps will help you keep on track with your medicines.   Notes:      Monitor and Manage My Blood Sugar-Diabetes Type 2       Timeframe:  Long-Range Goal Priority:  High Start Date:    04/01/21                         Expected End Date:    09/29/21                   Follow Up Date Nov 2022   - check blood sugar continuously via Dexcom G6 - check blood sugar if I feel it is too high or too low - take the blood sugar meter to all doctor visits  -Complete DEXCOM paperwork and send back to company so they will start regular shipments    Why is this important?   Checking your blood sugar at home helps to keep it from getting very high or very low.  Writing the results in a diary or log helps the doctor know how to care for you.  Your blood sugar log should have the time, date and the results.  Also, write down the amount of insulin or other medicine that you take.  Other information, like what you ate, exercise done and how you were feeling, will also be helpful.     Notes:         Care Plan : CCM Pharmacy Care Plan  Updates made by Kathyrn Sheriff, RPH since 04/17/2021 12:00 AM     Problem: Hypertension, Hyperlipidemia, Diabetes, Atrial Fibrillation, and Chronic Kidney Disease   Priority: High     Long-Range Goal: Disease management   Start Date: 12/26/2020  Expected End Date: 06/27/2021  This Visit's Progress: On  track  Recent Progress: Not on track  Priority: High  Note:   Current Barriers:  Unable to independently monitor therapeutic efficacy Unable to maintain control of diabetes  Pharmacist Clinical Goal(s):  Patient will achieve adherence to monitoring guidelines and medication adherence to achieve therapeutic efficacy adhere to plan to optimize therapeutic regimen for diabetes as evidenced by report of adherence to recommended medication management changes through collaboration with PharmD and provider.   Interventions: 1:1 collaboration with Myrlene Broker, MD regarding development and update of comprehensive plan of care as evidenced by provider attestation and co-signature Inter-disciplinary care team collaboration (see longitudinal plan of care) Comprehensive medication review performed; medication list updated in electronic medical record   AFIB / Hypertension /  CKD    Hx sick sinus syndrome s/p PPM  BP goal is:  <130/80 Patient checks BP at home daily Patient home BP readings are ranging: 110/83   Patient has tried/failed these meds in past:  triamterene/hctz, amlodipine Patient is currently controlled on the following medications: Metoprolol succinate 25 mg daily (Tillery, PA) Potassium ER 10 mEq BID (Dr Malen Gauze) Aspirin 81 mg daily   We discussed: pt reports compliance with medications above; he recently was changed from amlodipine to metoprolol; he reports BP at home is "good"   Plan:  Recommend to continue current medication  Diabetes    A1c goal <8% Checking BG: via Dexcom G6 Recent FBG Readings: 160 Recent HG readings: 198   Patient has failed these meds in past: glipizide Patient is currently uncontrolled on the following medications: Lantus 10 units qAM  Rybelsus 14 mg daily Pioglitazone 30 mg daily Farxiga 10 mg daily (Dr Malen Gauze) Dexcom G6    We discussed: A1c is 12.5%, equating to average BG ~315; discussed BG is likely much higher later in the day  after eating, since fasting BG are near goal; counseled on limiting high-carb foods (potatoes, bread, pasta, rice) as well as sugary drinks;  -pt is wearing Dexcom G6 since 10/11 (samples x 30 ds provided from endocrine); he reports the sensor/receiver "disconnected" once after a shower but reconnected shortly after; no interruptions in readings noted; he reports he has not received his shipment from DME but received a form he needs to fill out and send back to them; advised he send this back ASAP   Plan: Recommend to continue current medication; continue CGM  Hyperlipidemia    LDL goal < 100  Patient has failed these meds in past: n/a Patient is currently controlled on the following medications: Atorvastatin 20 mg daily Omega-3 Fish oil 300 mg daily   We discussed: pt endorses compliance and denies issues; LDL is near goal but has not been checked since 04/2019   Plan: Continue current medications; check lipid panel this year  Patient Goals/Self-Care Activities Patient will:  - take medications as prescribed -focus on medication adherence by pill box -check glucose continuously via Dexcom G6 -check blood pressure twice a week -Complete DEXCOM paperwork and send back to company so they will start regular shipments      Patient verbalizes understanding of instructions provided today and agrees to view in MyChart.  Telephone follow up appointment with pharmacy team member scheduled for: 1 month  Al Corpus, PharmD, [place], CPP Clinical Pharmacist [facility] [phone]

## 2021-04-18 DIAGNOSIS — H401232 Low-tension glaucoma, bilateral, moderate stage: Secondary | ICD-10-CM | POA: Diagnosis not present

## 2021-04-18 DIAGNOSIS — Z961 Presence of intraocular lens: Secondary | ICD-10-CM | POA: Diagnosis not present

## 2021-04-30 ENCOUNTER — Ambulatory Visit (INDEPENDENT_AMBULATORY_CARE_PROVIDER_SITE_OTHER): Payer: Medicare Other | Admitting: Endocrinology

## 2021-04-30 ENCOUNTER — Other Ambulatory Visit: Payer: Self-pay

## 2021-04-30 ENCOUNTER — Encounter: Payer: Self-pay | Admitting: Endocrinology

## 2021-04-30 VITALS — BP 130/100 | HR 93 | Ht 72.0 in | Wt 160.8 lb

## 2021-04-30 DIAGNOSIS — E118 Type 2 diabetes mellitus with unspecified complications: Secondary | ICD-10-CM

## 2021-04-30 DIAGNOSIS — E119 Type 2 diabetes mellitus without complications: Secondary | ICD-10-CM

## 2021-04-30 DIAGNOSIS — Z794 Long term (current) use of insulin: Secondary | ICD-10-CM | POA: Diagnosis not present

## 2021-04-30 LAB — POCT GLYCOSYLATED HEMOGLOBIN (HGB A1C): Hemoglobin A1C: 11.5 % — AB (ref 4.0–5.6)

## 2021-04-30 MED ORDER — INSULIN LISPRO PROT & LISPRO (75-25 MIX) 100 UNIT/ML KWIKPEN: 10.0000 [IU] | PEN_INJECTOR | Freq: Every day | SUBCUTANEOUS | 3 refills | Status: DC

## 2021-04-30 NOTE — Patient Instructions (Addendum)
I have sent a prescription to your pharmacy, to change to 75/25 insulin, with breakfast.   check your blood sugar twice a day.  vary the time of day when you check, between before the 3 meals, and at bedtime.  also check if you have symptoms of your blood sugar being too high or too low.  please keep a record of the readings and bring it to your next appointment here (or you can bring the meter itself).  You can write it on any piece of paper.  please call us sooner if your blood sugar goes below 70, or if you have a lot of readings over 200.   Please come back for a follow-up appointment in 1 month.

## 2021-04-30 NOTE — Progress Notes (Signed)
Subjective:    Patient ID: Michael Snyder, male    DOB: 06-28-1951, 70 y.o.   MRN: [id number]  HPI Pt returns for f/u of diabetes mellitus:  DM type: Insulin-requiring type 2 Dx'ed: 3570 Complications: stage 3 CRI.   Therapy: insulin since 2021, and 2 oral meds.   DKA: never Severe hypoglycemia: never Pancreatitis: never Pancreatic imaging: CT (2020): Fatty atrophy. Calcification of the ducts, and dilatation of the main duct.   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  Other: fructosamine converts to higher A1c than A1c itself and cbg's; pt meter cbg was similar to office meter.   Interval history: pt states he feels well in general.  He takes insulin as rx'ed.  I reviewed continuous glucose monitor data.  Glucose varies from 120-500.  It is in general lowest at [place], and highest 11AM-12MN.  It decreases overnight.  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   Pacemaker    Medtronic Adapta ADDR01   Seizure disorder ([place])    Seizures ([place])    last time in 1990's    Past Surgical History:  Procedure Laterality Date   LACERATION REPAIR  1980   scalp   PACEMAKER LEAD REMOVAL N/A 03/23/2014   Procedure: PACEMAKER LEAD REMOVAL;  Surgeon: Evans Lance, MD;  Location: [facility];  Service: Cardiovascular;  Laterality: N/A;   PTDVP     7/08 medtronic DDD    Social History   Socioeconomic History   Marital status: Legally Separated    Spouse name: Not on file   Number of children: 3   Years of education: Not on file   Highest education level: Not on file  Occupational History   Occupation: Games developer, truck driver  Tobacco Use   Smoking status: Never   Smokeless tobacco: Never  Vaping Use   Vaping Use: Never used  Substance and Sexual Activity   Alcohol use: No    Comment: quit  20 yrs ago   Drug use: No   Sexual activity: Yes    Partners: Female  Other Topics Concern   Not on file  Social History Narrative   12th grade education. H/O substance abuse- now abstemious and works with other in recovery..Attends NA on a regular basis and serves as a sponsor. Married 18 years-divorced. Married 5 years 2nd marriage.3 daughters previous marriage, 9 grandchildren, 3 great-grands. Work- Merchant navy officer            Social Determinants of Radio broadcast assistant Strain: Not on [employer] Insecurity: Not on file  Transportation Needs: Not on file  Physical Activity: Not on file  Stress: Not on file  Social Connections: Not on file  Intimate Partner Violence: Not on file    Current Outpatient Medications on File Prior to Visit  Medication Sig Dispense Refill   aspirin EC 81 MG tablet Take 81 mg daily by mouth.     atorvastatin (LIPITOR) 20 MG tablet Take 1 tablet (20 mg total) by mouth daily. 90 tablet 3   Blood Pressure Monitoring (BLOOD PRESSURE KIT) DEVI 1 kit by Does not apply route daily. 1 each 0   brimonidine (ALPHAGAN) 0.2 % ophthalmic solution SMARTSIG:In  Eye(s)     Calcium Carbonate-Vitamin D (CALTRATE 600+D) 600-400 MG-UNIT per tablet Take 1 tablet by mouth 2 (two) times daily. 180 tablet 3   Cholecalciferol (VITAMIN D) 50 MCG (2000 UT) tablet Take 2,000 Units by mouth daily.     Continuous Blood Gluc Receiver (DEXCOM G6 RECEIVER) DEVI Sync with Dexcom sensor to track glucose 1 each 0   Continuous Blood Gluc Sensor (DEXCOM G6 SENSOR) MISC 1 Device by Does not apply route See admin instructions. Change every 10 days 9 each 3   Continuous Blood Gluc Transmit (DEXCOM G6 TRANSMITTER) MISC Use with Dexcom sensor to track glucose 1 each 3   dapagliflozin propanediol (FARXIGA) 10 MG TABS tablet Take 10 mg by mouth daily.     hydrochlorothiazide (MICROZIDE) 12.5 MG capsule Take 12.5 mg by mouth daily.     Lancets (ONETOUCH DELICA PLUS MGNOIB70W) MISC  CHECK BLOOD SUGAR TWICE  DAILY 200 each 3   latanoprost (XALATAN) 0.005 % ophthalmic solution SMARTSIG:In Eye(s)     levothyroxine (SYNTHROID) 25 MCG tablet Take 1 tablet (25 mcg total) by mouth daily before breakfast. 90 tablet 3   linaclotide (LINZESS) 145 MCG CAPS capsule Take 1 capsule (145 mcg total) by mouth daily before breakfast. 90 capsule 3   metoprolol succinate (TOPROL XL) 25 MG 24 hr tablet Take 1 tablet (25 mg total) by mouth daily. 90 tablet 3   Multiple Vitamin (MULTIVITAMIN WITH MINERALS) TABS tablet Take 1 tablet by mouth daily.     Omega-3 Fatty Acids (FISH OIL) 300 MG CAPS Take 1 capsule (300 mg total) by mouth daily. 90 capsule 3   ONETOUCH VERIO test strip TEST TWICE DAILY 200 strip 3   pioglitazone (ACTOS) 30 MG tablet Take 1 tablet (30 mg total) by mouth daily. 90 tablet 3   polyethylene glycol powder (GLYCOLAX/MIRALAX) 17 GM/SCOOP powder Take 17 g by mouth 2 (two) times daily as needed. 255 g 0   potassium chloride 20 MEQ TBCR Take 20 mEq by mouth 2 (two) times daily. 180 tablet 3   RYBELSUS 14 MG TABS TAKE 1 TABLET BY MOUTH  DAILY 90 tablet 3   triamcinolone ointment (KENALOG) 0.5 % APPLY TO AFFECTED AREA(S)  TOPICALLY TWICE DAILY 90 g 0   No current facility-administered medications on file prior to visit.    Allergies  Allergen Reactions   Clindamycin/Lincomycin Swelling    Possible facial and lip swelling   Ace Inhibitors     Facial and lip swelling possibly associated    Family History  Problem Relation Age of Onset   Hypertension Mother    COPD Mother    Hypertension Brother    Cancer Brother        leukemia   Hypertension Brother    Diabetes Neg Hx     BP (!) 130/100 (BP Location: Right Arm, Patient Position: Sitting, Cuff Size: Normal)   Pulse 93   Ht 6' (1.829 m)   Wt 160 lb 12.8 oz (72.9 kg)   SpO2 99%   BMI 21.81 kg/m    Review of Systems     Objective:   Physical Exam Pulses: dorsalis pedis intact bilat.   MSK: no deformity of  the feet.   CV: no leg edema.   Skin:  no ulcer on the feet.  normal color and temp on the feet.   Neuro: sensation is intact to touch on the feet.     Lab Results  Component Value Date   HGBA1C 11.5 (A)  04/30/2021      Assessment & Plan:  Insulin-requiring type 2 DM: severe exacerbation.   Lean body habitus: in this setting, he is at risk for hypoglycemia, so he should continue use of CGM.  Patient Instructions  I have sent a prescription to your pharmacy, to change to 75/25 insulin, with breakfast.   check your blood sugar twice a day.  vary the time of day when you check, between before the 3 meals, and at bedtime.  also check if you have symptoms of your blood sugar being too high or too low.  please keep a record of the readings and bring it to your next appointment here (or you can bring the meter itself).  You can write it on any piece of paper.  please call us sooner if your blood sugar goes below 70, or if you have a lot of readings over 200.   Please come back for a follow-up appointment in 1 month.

## 2021-05-02 ENCOUNTER — Ambulatory Visit (INDEPENDENT_AMBULATORY_CARE_PROVIDER_SITE_OTHER): Payer: Medicare Other

## 2021-05-02 DIAGNOSIS — I480 Paroxysmal atrial fibrillation: Secondary | ICD-10-CM | POA: Diagnosis not present

## 2021-05-05 DIAGNOSIS — E119 Type 2 diabetes mellitus without complications: Secondary | ICD-10-CM

## 2021-05-05 DIAGNOSIS — E1169 Type 2 diabetes mellitus with other specified complication: Secondary | ICD-10-CM | POA: Diagnosis not present

## 2021-05-05 DIAGNOSIS — E785 Hyperlipidemia, unspecified: Secondary | ICD-10-CM | POA: Diagnosis not present

## 2021-05-05 DIAGNOSIS — I480 Paroxysmal atrial fibrillation: Secondary | ICD-10-CM

## 2021-05-05 DIAGNOSIS — I10 Essential (primary) hypertension: Secondary | ICD-10-CM | POA: Diagnosis not present

## 2021-05-05 DIAGNOSIS — Z794 Long term (current) use of insulin: Secondary | ICD-10-CM

## 2021-05-05 LAB — CUP PACEART REMOTE DEVICE CHECK
Battery Impedance: 403 Ohm
Battery Remaining Longevity: 109 mo
Battery Voltage: 2.78 V
Brady Statistic AP VP Percent: 0 %
Brady Statistic AP VS Percent: 9 %
Brady Statistic AS VP Percent: 1 %
Brady Statistic AS VS Percent: 90 %
Date Time Interrogation Session: 20221028163554
Implantable Lead Implant Date: 20080711
Implantable Lead Implant Date: 20150918
Implantable Lead Location: 753859
Implantable Lead Location: 753860
Implantable Lead Model: 5076
Implantable Lead Model: 5076
Implantable Pulse Generator Implant Date: 20150918
Lead Channel Impedance Value: 467 Ohm
Lead Channel Impedance Value: 479 Ohm
Lead Channel Pacing Threshold Amplitude: 0.625 V
Lead Channel Pacing Threshold Amplitude: 0.875 V
Lead Channel Pacing Threshold Pulse Width: 0.4 ms
Lead Channel Pacing Threshold Pulse Width: 0.4 ms
Lead Channel Setting Pacing Amplitude: 2 V
Lead Channel Setting Pacing Amplitude: 2.5 V
Lead Channel Setting Pacing Pulse Width: 0.4 ms
Lead Channel Setting Sensing Sensitivity: 2 mV

## 2021-05-12 NOTE — Progress Notes (Signed)
Remote pacemaker transmission.   

## 2021-05-15 ENCOUNTER — Other Ambulatory Visit: Payer: Self-pay

## 2021-05-15 ENCOUNTER — Ambulatory Visit (INDEPENDENT_AMBULATORY_CARE_PROVIDER_SITE_OTHER): Payer: Medicare Other | Admitting: Pharmacist

## 2021-05-15 DIAGNOSIS — E785 Hyperlipidemia, unspecified: Secondary | ICD-10-CM

## 2021-05-15 DIAGNOSIS — E1169 Type 2 diabetes mellitus with other specified complication: Secondary | ICD-10-CM

## 2021-05-15 DIAGNOSIS — Z794 Long term (current) use of insulin: Secondary | ICD-10-CM

## 2021-05-15 DIAGNOSIS — N1832 Chronic kidney disease, stage 3b: Secondary | ICD-10-CM

## 2021-05-15 DIAGNOSIS — E119 Type 2 diabetes mellitus without complications: Secondary | ICD-10-CM

## 2021-05-15 DIAGNOSIS — I10 Essential (primary) hypertension: Secondary | ICD-10-CM

## 2021-05-15 DIAGNOSIS — I480 Paroxysmal atrial fibrillation: Secondary | ICD-10-CM

## 2021-05-15 NOTE — Progress Notes (Signed)
Chronic Care Management Pharmacy Note  05/15/2021 Name:  Michael Snyder MRN:  [id number] DOB:  1951-01-19  Summary: -Pt was approved for Dexcom G6 and has been wearing it for about a month; he endorses compliance with insulin change from Lantus to Humalog Mix per endocrine; he endorses sugars have improved since starting exercise  Recommendations/Changes made from today's visit: -No med changes   Subjective: Michael Snyder is an 70 y.o. year old male who is a primary patient of Hoyt Koch, MD.  The CCM team was consulted for assistance with disease management and care coordination needs.    Engaged with patient by telephone for follow up visit in response to provider referral for pharmacy case management and/or care coordination services.   Consent to Services:  The patient was given information about Chronic Care Management services, agreed to services, and gave verbal consent prior to initiation of services.  Please see initial visit note for detailed documentation.   Patient Care Team: Hoyt Koch, MD as PCP - General (Internal Medicine) Deboraha Sprang, MD as PCP - Electrophysiology (Cardiology) Charlton Haws, [facility] as Pharmacist (Pharmacist)  Patient lives at home alone. 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  Recent office visits: 03/13/21 Dr Sharlet Salina OV: Physical - A1c 12.5, advised to see endo.  07/29/20 Dr Sharlet Salina OV: acute visit for recent food poisoning, improving. Advised Tums/Rolaids.  07/18/20 Dr Sharlet Salina OV: acute visit for constipation. Rx'd Linzess.  06/17/20 Dr Sharlet Salina OV: chronic f/u. Pt reports urologist told him to stop amlodipine and actos- per PCP's review of note no evidence of this. Advised to stop glipizide when starting Lantus (per endocrine). K low, switched HCTZ to triamterene/HCTZ and advised to stop potassium.  Recent consult  visits: 04/30/21 Dr Loanne Drilling (endocrine): A1c improved to 11.5%; Dexcom SBG 120-500, lowest 8am, highest 11am-47m, decreases overnight. Changed Lantus to Humalog 75/25 10 units w/ breakfast.  04/15/21 Nutrition - Dexcom G6 training  03/26/21 Dr ELoanne Drilling(endocrine): A1c 12.5; refilled Freestyle sensors. Update - Freestyle not covered, Dexcom sensors [facility]  03/24/21 PA Tillery (cardiology): f/u SSS, Afib. Stop amlodipine, start Metoprolol succinate 25 mg daily.  12/11/20 RD LLeonia Reader(DM/nutrition): [facility].  11/26/20 Dr ELoanne Drilling(endocrine): pt skips insulin several times per week d/t CBG in 100s.  09/23/20 Dr ELoanne Drilling(endocrine): A1c improved to 9.5. Hypoglycemia. Changed Lantus to AM. Rx'd Freestyle Libre.  09/03/20 Dr FRoyce Macadamia(nephrology): pt should have stopped HCTZ, potassium last visit. K 3.5, GFR 47. Started Farxiga 10 mg. Started amlodipine 2.5 mg daily. (Believes pt to be off HCTZ, triamterene/HCTZ)  08/19/20 RD LAntonieta Iba(CDE): DM/ nutrition education  08/02/20 Dr ELoanne Drilling(endocrine): f/u DM. New hypoTH. Rx'd levothyroxine. Frucosamine 569 and A1c 10.4. Referred to CDE.  06/05/20 Dr FRoyce Macadamia(nephrology): stop HCTZ and chromium.  Hospital visits: None in previous 6 months 07/14/20 ED visit - MSanteefor constipation. Disimpaction at bedside. Rec'd Miralax and hydration   Objective:  Lab Results  Component Value Date   CREATININE 1.73 (H) 04/01/2021   BUN 18 04/01/2021   GFR 38.42 (L) 03/13/2021   GFRNONAA 45 (L) 07/14/2020   GFRAA 54 (L) 11/29/2018   NA 139 04/01/2021   K 4.3 04/01/2021   CALCIUM 9.1 04/01/2021   CO2 18 (L) 04/01/2021   GLUCOSE 326 (H) 04/01/2021    Lab Results  Component Value  Date/Time   HGBA1C 11.5 (A) 04/30/2021 07:52 AM   HGBA1C 12.5 (A) 03/26/2021 10:10 AM   HGBA1C 12.5 (H) 03/13/2021 10:21 AM   HGBA1C 10.6 (H) 04/20/2019 08:55 AM   FRUCTOSAMINE 569 (H) 08/02/2020 08:15 AM   FRUCTOSAMINE 442 (H)  02/01/2020 08:14 AM   GFR 38.42 (L) 03/13/2021 10:21 AM   GFR 42.35 (L) 06/17/2020 03:40 PM   MICROALBUR 2.1 (H) 03/13/2021 10:21 AM   MICROALBUR 0.9 06/17/2020 03:40 PM    Last diabetic Eye exam:  Lab Results  Component Value Date/Time   HMDIABEYEEXA No Retinopathy 06/07/2017 12:00 AM    Last diabetic Foot exam: No results found for: HMDIABFOOTEX   Lab Results  Component Value Date   CHOL 119 03/13/2021   HDL 41.30 03/13/2021   LDLCALC 57 03/13/2021   LDLDIRECT 102.0 04/20/2019   TRIG 107.0 03/13/2021   CHOLHDL 3 03/13/2021    Hepatic Function Latest Ref Rng & Units 03/13/2021 07/14/2020 06/17/2020  Total Protein 6.0 - 8.3 g/dL 6.7 7.3 6.9  Albumin 3.5 - 5.2 g/dL 4.0 4.7 4.2  AST 0 - 37 U/L 34 39 26  ALT 0 - 53 U/L _0 Alk Phosphatase 39 - 117 U/L 77 72 78  Total Bilirubin 0.2 - 1.2 mg/dL 0.8 1.1 0.7  Bilirubin, Direct 0.01 - 0.4 - - -    Lab Results  Component Value Date/Time   TSH 4.65 (H) 08/02/2020 08:11 AM   TSH 2.36 06/10/2017 03:09 PM    CBC Latest Ref Rng & Units 03/13/2021 07/14/2020 06/17/2020  WBC 4.0 - 10.5 K/uL 6.0 9.7 6.7  Hemoglobin 13.0 - 17.0 g/dL 12.6(L) 13.5 12.7(L)  Hematocrit 39.0 - 52.0 % 38.5(L) 39.8 38.4(L)  Platelets 150.0 - 400.0 K/uL 147.0(L) 171 135.0(L)    No results found for: VD25OH  Clinical ASCVD: No  The ASCVD Risk score (Arnett DK, et al., 2019) failed to calculate for the following reasons:   The valid total cholesterol range is 130 to 320 mg/dL    Depression screen [facility] 2/9 03/13/2021 08/19/2020 02/20/2020  Decreased Interest 0 0 0  Down, Depressed, Hopeless 0 0 0  PHQ - 2 Score 0 0 0  Some recent data might be hidden    CHA2DS2-VASc Score = 3  The patient's score is based upon: CHF History: 0 HTN History: 1 Diabetes History: 1 Stroke History: 0 Vascular Disease History: 0 Age Score: 1 Gender Score: 0    Social History   Tobacco Use  Smoking Status Never  Smokeless Tobacco Never   BP Readings from Last 3  Encounters:  04/30/21 (!) 130/100  03/26/21 118/80  03/24/21 (!) 140/92   Pulse Readings from Last 3 Encounters:  04/30/21 93  03/26/21 78  03/24/21 81   Wt Readings from Last 3 Encounters:  04/30/21 160 lb 12.8 oz (72.9 kg)  03/26/21 156 lb 3.2 oz (70.9 kg)  03/24/21 158 lb 3.2 oz (71.8 kg)   BMI Readings from Last 3 Encounters:  04/30/21 21.81 kg/m  03/26/21 21.18 kg/m  03/24/21 21.46 kg/m    Assessment/Interventions: Review of patient past medical history, allergies, medications, health status, including review of consultants reports, laboratory and other test data, was performed as part of comprehensive evaluation and provision of chronic care management services.   SDOH:  (Social Determinants of Health) assessments and interventions performed: Yes  SDOH Screenings   Alcohol Screen: Not on file  Depression (PHQ2-9): Low Risk    PHQ-2 Score: 0  Financial Resource  Strain: Not on file  Food Insecurity: Not on file  Housing: Not on file  Physical Activity: Not on file  Social Connections: Not on file  Stress: Not on file  Tobacco Use: Low Risk    Smoking Tobacco Use: Never   Smokeless Tobacco Use: Never   Passive Exposure: Not on file  Transportation Needs: Not on file    [place]  Allergies  Allergen Reactions   Clindamycin/Lincomycin Swelling    Possible facial and lip swelling   Ace Inhibitors     Facial and lip swelling possibly associated    Medications Reviewed Today     Reviewed by Charlton Haws, [facility] (Pharmacist) on 05/15/21 at 1324  Med List Status: <None>   Medication Order Taking? Sig Documenting Provider Last Dose Status Informant  aspirin EC 81 MG tablet [id number] Yes Take 81 mg daily by mouth. [provider] Taking Active   atorvastatin (LIPITOR) 20 MG tablet [id number] Yes Take 1 tablet (20 mg total) by mouth daily. Hoyt Koch, MD Taking Active   Blood Pressure Monitoring (BLOOD PRESSURE KIT) DEVI [id number]  Yes 1 kit by Does not apply route daily. Hoyt Koch, MD Taking Active   brimonidine [facility]) 0.2 % ophthalmic solution [id number] Yes SMARTSIG:In Eye(s) [provider] Taking Active   Calcium Carbonate-Vitamin D (CALTRATE 600+D) 600-400 MG-UNIT per tablet [id number] Yes Take 1 tablet by mouth 2 (two) times daily. Norins, Heinz Knuckles, MD Taking Active   Cholecalciferol (VITAMIN D) 50 MCG (2000 UT) tablet [id number] Yes Take 2,000 Units by mouth daily. [provider] Taking Active   Continuous Blood Gluc Receiver ([place]) [place] [id number] Yes Sync with Dexcom sensor to track glucose Hoyt Koch, MD Taking Active   Continuous Blood Gluc Sensor ([place]) [place] [id number] Yes 1 Device by Does not apply route See admin instructions. Change every 10 days Hoyt Koch, MD Taking Active   Continuous Blood Gluc Transmit (DEXCOM G6 TRANSMITTER) MISC [id number] Yes Use with Dexcom sensor to track glucose Hoyt Koch, MD Taking Active   dapagliflozin propanediol (FARXIGA) 10 MG TABS tablet [id number] Yes Take 10 mg by mouth daily. Claudia Desanctis, MD Taking Active   hydrochlorothiazide (MICROZIDE) 12.5 MG capsule [id number] Yes Take 12.5 mg by mouth daily. Claudia Desanctis, MD Taking Active   Insulin Lispro Prot & Lispro (HUMALOG MIX 75/25 KWIKPEN) (75-25) 100 UNIT/ML Claiborne Rigg [id number] Yes Inject 10 Units into the skin daily with breakfast. And pen needles 1/day Renato Shin, MD Taking Active   Lancets (ONETOUCH DELICA PLUS [id number]) [place] [id number] Yes CHECK BLOOD SUGAR TWICE  DAILY Hoyt Koch, MD Taking Active   latanoprost (XALATAN) 0.005 % ophthalmic solution [id number] Yes SMARTSIG:In Eye(s) [provider] Taking Active   levothyroxine (SYNTHROID) 25 MCG tablet [id number] Yes Take 1 tablet (25 mcg total) by mouth daily before breakfast. Renato Shin, MD Taking Active   linaclotide [facility]) 145 MCG CAPS capsule [id number]  Yes Take 1 capsule (145 mcg total) by mouth daily before breakfast. Hoyt Koch, MD Taking Active   metoprolol succinate (TOPROL XL) 25 MG 24 hr tablet [id number] Yes Take 1 tablet (25 mg total) by mouth daily. Shirley Friar, PA-C Taking Active   Multiple Vitamin (MULTIVITAMIN WITH MINERALS) TABS tablet [id number] Yes Take 1 tablet by mouth daily. [provider] Taking Active Self  Omega-3 Fatty Acids (FISH OIL) 300 MG CAPS [id number] Yes Take 1 capsule (300 mg total) by mouth daily. Norins,  Heinz Knuckles, MD Taking Active Self  Roma Schanz test strip [id number] Yes TEST TWICE DAILY Hoyt Koch, MD Taking Active   pioglitazone (ACTOS) 30 MG tablet [id number] Yes Take 1 tablet (30 mg total) by mouth daily. Renato Shin, MD Taking Active   polyethylene glycol powder [facility]) 17 GM/SCOOP powder [id number] Yes Take 17 g by mouth 2 (two) times daily as needed. Carlisle Cater, PA-C Taking Active   potassium chloride 20 MEQ TBCR [id number] Yes Take 20 mEq by mouth 2 (two) times daily. Shirley Friar, PA-C Taking Active   RYBELSUS 14 MG TABS [id number] Yes TAKE 1 TABLET BY MOUTH  DAILY Renato Shin, MD Taking Active   triamcinolone ointment (KENALOG) 0.5 % [id number] Yes APPLY TO AFFECTED AREA(S)  TOPICALLY TWICE DAILY Hoyt Koch, MD Taking Active             Patient Active Problem List   Diagnosis Date Noted   Hypothyroidism 08/03/2020   Constipation 07/18/2020   De Quervain's tenosynovitis, left 03/14/2019   Left wrist pain 09/23/2018   Hepatitis C virus infection cured after antiviral drug therapy 12/24/2015   Cataract, right eye 11/13/2014   Cataract, left eye 21/22/4825   Complications, pacemaker cardiac, 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/2014   Atrial fibrillation ([place]) 11/17/2012   Cardiac pacemaker MDT 11/26/2011   Routine health maintenance 06/16/2011   Essential hypertension 06/01/2008   Diabetes mellitus type 2 with complications ([place])  00/37/0488   SICK SINUS SYNDROME 03/26/2007   SEIZURE DISORDER 03/26/2007    Immunization History  Administered Date(s) Administered   Fluad Quad(high Dose 65+) 04/20/2019, 07/29/2020, 03/13/2021   Influenza Whole 03/30/2008, 04/16/2009, 06/05/2010   Influenza, High Dose Seasonal PF 06/02/2016, 04/05/2017, 03/29/2018   Influenza,inj,Quad PF,6+ Mos 05/16/2013, 03/08/2014, 08/21/2015   PFIZER(Purple Top)SARS-COV-2 Vaccination 10/27/2019, 11/17/2019, 05/28/2020, 10/24/2020   Pneumococcal Conjugate-13 11/01/2014   Pneumococcal Polysaccharide-23 09/13/2012, 12/30/2017   Td 03/30/2008   Tdap 05/02/2018   Zoster Recombinat (Shingrix) 12/30/2017   Zoster, Live 07/20/2014    Conditions to be addressed/monitored:  Hypertension, Hyperlipidemia, Diabetes, Atrial Fibrillation, and Chronic Kidney Disease  Care Plan : [place]  Updates made by Charlton Haws, [place] since 05/15/2021 12:00 AM     Problem: Hypertension, Hyperlipidemia, Diabetes, Atrial Fibrillation, and Chronic Kidney Disease   Priority: High     Long-Range Goal: Disease management   Start Date: 12/26/2020  Expected End Date: 06/27/2021  This Visit's Progress: On track  Recent Progress: On track  Priority: High  Note:   Current Barriers:  Unable to independently monitor therapeutic efficacy Unable to maintain control of diabetes  Pharmacist Clinical Goal(s):  Patient will achieve adherence to monitoring guidelines and medication adherence to achieve therapeutic efficacy adhere to plan to optimize therapeutic regimen for diabetes as evidenced by report of adherence to recommended medication management changes through collaboration with PharmD and provider.   Interventions: 1:1 collaboration with Hoyt Koch, MD regarding development and update of comprehensive plan of care as evidenced by provider attestation and co-signature Inter-disciplinary care team collaboration (see longitudinal plan of  care) Comprehensive medication review performed; medication list updated in electronic medical record   AFIB / Hypertension / CKD    Hx sick sinus syndrome s/p PPM  BP goal is:  <130/80 Patient checks BP at home daily Patient home BP readings are ranging: 110/83   Patient has tried/failed these meds in past:  triamterene/hctz, amlodipine Patient is currently controlled on the following medications: Metoprolol succinate 25 mg daily (Tillery, PA) Potassium  ER 10 mEq BID (Dr Royce Macadamia) Aspirin 81 mg daily   We discussed: pt reports compliance with medications above; he recently was changed from amlodipine to metoprolol; he reports BP at home is "good"   Plan:  Recommend to continue current medication  Diabetes    A1c goal <8% Checking BG: via Dexcom G6 Managed per Dr Loanne Drilling   Patient has failed these meds in past: glipizide Patient is currently uncontrolled on the following medications: Humalog mix 75/25 - 10 units w/ breakfast Rybelsus 14 mg daily Pioglitazone 30 mg daily Farxiga 10 mg daily (Dr Royce Macadamia) Dexcom G6    We discussed: A1c recently improved 12.5>11.5% and insulin was changed from Lantus to Humalog mix to target highest BG mid-day (per Dexcom report); pt endorses compliance with new insulin and has not changed his pills -pt reports he has started exercising and has seen an improvement in BG because of this; counseled on benefits of continued exercise; pt is motivated to improve DM because 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   Plan: Recommend to continue current medication; continue CGM  Hyperlipidemia    LDL goal < 100  Patient has failed these meds in past: n/a Patient is currently controlled on the following medications: Atorvastatin 20 mg daily Omega-3 Fish oil 300 mg daily   We discussed: LDL is at goal; pt endorses compliance and denies issues   Plan: Continue current medications  Patient Goals/Self-Care Activities Patient  will:  - take medications as prescribed -focus on medication adherence by pill box -check glucose continuously via Dexcom G6 -check blood pressure twice a week      Medication Assistance: None required.  Patient affirms current coverage meets needs.  Compliance/Adherence/Medication fill history: Care Gaps: Eye exam (due 06/07/18)  Star-Rating Drugs: Atorvastatin - LF 03/24/21 x 90 ds ([facility]) Farxiga - LF 04/16/21 x 30 ds ([facility]) Pioglitazone - LF 02/11/21 x 90 ds (Mail order) Rybelsus - LF 02/11/21 x 90 ds (Mail order)  Patient's preferred pharmacy is:  [facility] ([place] - [place], [place] [place] [address] Phone: [phone] Fax: [phone]  [place] [address] [place] [address] Phone: [phone] Fax: [phone]  [place] [address] Phone: [phone] Fax: [phone]  Uses pill box? Yes Pt endorses 100% compliance  We discussed: Current pharmacy is preferred with insurance plan and patient is satisfied with pharmacy services Patient decided to: Continue current medication management strategy  Care Plan and Follow Up Patient Decision:  Patient agrees to Care Plan and Follow-up.  Plan: Telephone follow up appointment with care management team member scheduled for:  3 months  Charlene Brooke, PharmD, [place], CPP Clinical Pharmacist Practitioner [facility] at [facility] [phone]

## 2021-05-15 NOTE — Patient Instructions (Signed)
Visit Information  Phone number for Pharmacist: [phone]   Goals Addressed             This Visit's Progress    Manage My Medicine       Timeframe:  Long-Range Goal Priority:  High Start Date:    12/26/20                         Expected End Date:    05/15/22                Follow Up Date: Feb 2023   - call for medicine refill 2 or 3 days before it runs out - call if I am sick and can't take my medicine - keep a list of all the medicines I take; vitamins and herbals too - use a pillbox to sort medicine     Why is this important?   These steps will help you keep on track with your medicines.   Notes:      Monitor and Manage My Blood Sugar-Diabetes Type 2       Timeframe:  Long-Range Goal Priority:  High Start Date:    04/01/21                         Expected End Date:    09/29/21                   Follow Up Date Feb 2023   - check blood sugar continuously via Dexcom G6 - check blood sugar if I feel it is too high or too low - take the blood sugar meter to all doctor visits     Why is this important?   Checking your blood sugar at home helps to keep it from getting very high or very low.  Writing the results in a diary or log helps the doctor know how to care for you.  Your blood sugar log should have the time, date and the results.  Also, write down the amount of insulin or other medicine that you take.  Other information, like what you ate, exercise done and how you were feeling, will also be helpful.     Notes:         Care Plan : CCM Pharmacy Care Plan  Updates made by Kathyrn Sheriff, RPH since 05/15/2021 12:00 AM     Problem: Hypertension, Hyperlipidemia, Diabetes, Atrial Fibrillation, and Chronic Kidney Disease   Priority: High     Long-Range Goal: Disease management   Start Date: 12/26/2020  Expected End Date: 06/27/2021  This Visit's Progress: On track  Recent Progress: On track  Priority: High  Note:   Current Barriers:  Unable to  independently monitor therapeutic efficacy Unable to maintain control of diabetes  Pharmacist Clinical Goal(s):  Patient will achieve adherence to monitoring guidelines and medication adherence to achieve therapeutic efficacy adhere to plan to optimize therapeutic regimen for diabetes as evidenced by report of adherence to recommended medication management changes through collaboration with PharmD and provider.   Interventions: 1:1 collaboration with Myrlene Broker, MD regarding development and update of comprehensive plan of care as evidenced by provider attestation and co-signature Inter-disciplinary care team collaboration (see longitudinal plan of care) Comprehensive medication review performed; medication list updated in electronic medical record   AFIB / Hypertension / CKD    Hx sick sinus syndrome s/p PPM  BP goal is:  <130/80 Patient  checks BP at home daily Patient home BP readings are ranging: 110/83   Patient has tried/failed these meds in past:  triamterene/hctz, amlodipine Patient is currently controlled on the following medications: Metoprolol succinate 25 mg daily ([place], [place]) Potassium ER 10 mEq BID (Dr Malen Gauze) Aspirin 81 mg daily   We discussed: pt reports compliance with medications above; he recently was changed from amlodipine to metoprolol; he reports BP at home is "good"   Plan:  Recommend to continue current medication  Diabetes    A1c goal <8% Checking BG: via Dexcom G6 Managed per Dr Everardo All   Patient has failed these meds in past: glipizide Patient is currently uncontrolled on the following medications: Humalog mix 75/25 - 10 units w/ breakfast Rybelsus 14 mg daily Pioglitazone 30 mg daily Farxiga 10 mg daily (Dr Malen Gauze) Dexcom G6    We discussed: A1c recently improved 12.5>11.5% and insulin was changed from Lantus to Humalog mix to target highest BG mid-day (per Dexcom report); pt endorses compliance with new insulin and has not changed his  pills -pt reports he has started exercising and has seen an improvement in BG because of this; counseled on benefits of continued exercise; pt is motivated to improve DM because 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   Plan: Recommend to continue current medication; continue CGM  Hyperlipidemia    LDL goal < 100  Patient has failed these meds in past: n/a Patient is currently controlled on the following medications: Atorvastatin 20 mg daily Omega-3 Fish oil 300 mg daily   We discussed: LDL is at goal; pt endorses compliance and denies issues   Plan: Continue current medications  Patient Goals/Self-Care Activities Patient will:  - take medications as prescribed -focus on medication adherence by pill box -check glucose continuously via Dexcom G6 -check blood pressure twice a week      Patient verbalizes understanding of instructions provided today and agrees to view in MyChart.  Telephone follow up appointment with pharmacy team member scheduled for: 3 months  Al Corpus, PharmD, [place], CPP Clinical Pharmacist Practitioner [facility] [phone]

## 2021-06-02 ENCOUNTER — Other Ambulatory Visit: Payer: Self-pay

## 2021-06-02 ENCOUNTER — Telehealth: Payer: Self-pay | Admitting: Endocrinology

## 2021-06-02 ENCOUNTER — Other Ambulatory Visit: Payer: Self-pay | Admitting: Internal Medicine

## 2021-06-02 DIAGNOSIS — Z794 Long term (current) use of insulin: Secondary | ICD-10-CM

## 2021-06-02 MED ORDER — DEXCOM G6 SENSOR MISC: 1.0000 | 3 refills | Status: DC

## 2021-06-02 MED ORDER — DEXCOM G6 TRANSMITTER MISC
3 refills | Status: DC
Start: 2021-06-02 — End: 2021-06-06

## 2021-06-02 MED ORDER — DEXCOM G6 RECEIVER DEVI: 0 refills | Status: DC

## 2021-06-02 NOTE — Telephone Encounter (Signed)
Rx sent to preferred Pharmacy

## 2021-06-02 NOTE — Telephone Encounter (Signed)
PT called request refill on the following:  Continuous Blood Gluc Receiver (DEXCOM G6 RECEIVER) DEVI Continuous Blood Gluc Sensor (DEXCOM G6 SENSOR) MISC Continuous Blood Gluc Transmit (DEXCOM G6 TRANSMITTER) MISC  [facility] [address] ([address]), [place] - [address] Phone:  [phone]  Fax:  [phone]

## 2021-06-03 ENCOUNTER — Ambulatory Visit (INDEPENDENT_AMBULATORY_CARE_PROVIDER_SITE_OTHER): Payer: Medicare Other | Admitting: Endocrinology

## 2021-06-03 ENCOUNTER — Other Ambulatory Visit: Payer: Self-pay

## 2021-06-03 VITALS — BP 154/90 | HR 112 | Ht 72.0 in | Wt 165.8 lb

## 2021-06-03 DIAGNOSIS — E1165 Type 2 diabetes mellitus with hyperglycemia: Secondary | ICD-10-CM

## 2021-06-03 DIAGNOSIS — Z794 Long term (current) use of insulin: Secondary | ICD-10-CM | POA: Diagnosis not present

## 2021-06-03 DIAGNOSIS — E1122 Type 2 diabetes mellitus with diabetic chronic kidney disease: Secondary | ICD-10-CM | POA: Diagnosis not present

## 2021-06-03 DIAGNOSIS — E118 Type 2 diabetes mellitus with unspecified complications: Secondary | ICD-10-CM

## 2021-06-03 DIAGNOSIS — N183 Chronic kidney disease, stage 3 unspecified: Secondary | ICD-10-CM

## 2021-06-03 DIAGNOSIS — E119 Type 2 diabetes mellitus without complications: Secondary | ICD-10-CM

## 2021-06-03 MED ORDER — INSULIN LISPRO (1 UNIT DIAL) 100 UNIT/ML (KWIKPEN): 5.0000 [IU] | PEN_INJECTOR | Freq: Two times a day (BID) | SUBCUTANEOUS | 3 refills | Status: DC

## 2021-06-03 MED ORDER — DEXCOM G6 SENSOR MISC: 1.0000 | 3 refills | Status: DC

## 2021-06-03 NOTE — Progress Notes (Signed)
Subjective:    Patient ID: Michael Snyder, male    DOB: [birth year], 70 y.o.   MRN: [id number]  HPI Pt returns for f/u of diabetes mellitus:  DM type: Insulin-requiring type 2 Dx'ed: 6256 Complications: stage 3 CRI.   Therapy: insulin since 2021, and 3 oral meds.   DKA: never.   Severe hypoglycemia: never.   Pancreatitis: never Pancreatic imaging: CT (2020): Fatty atrophy. Calcification of the ducts, and dilatation of the main duct.   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   Other: fructosamine converts to higher A1c than A1c itself and cbg's; pt meter cbg was similar to office meter; he eats meals at 7AM, 1PM, and 8PM.     Interval history: pt states he feels well in general.  He takes insulin as rx'ed.  I reviewed continuous glucose monitor data.  Glucose varies from 90-400.  It is in general lowest at 1PM and 7PM, and highest 11PM-1AM.  It decreases overnight.   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   Pacemaker    Medtronic Adapta ADDR01   Seizure disorder ([place])    Seizures ([place])    last time in 1990's    Past Surgical History:  Procedure Laterality Date   LACERATION REPAIR  1980   scalp   PACEMAKER LEAD REMOVAL N/A 03/23/2014   Procedure: PACEMAKER LEAD REMOVAL;  Surgeon: Evans Lance, MD;  Location: [facility];  Service: Cardiovascular;  Laterality: N/A;   PTDVP     7/08 medtronic DDD    Social History   Socioeconomic History   Marital status: Legally Separated    Spouse name: Not on file   Number of children: 3   Years of education: Not on file   Highest education level: Not on file  Occupational History   Occupation: Games developer, truck driver  Tobacco Use   Smoking status: Never   Smokeless tobacco: Never  Vaping Use   Vaping Use: Never used  Substance and  Sexual Activity   Alcohol use: No    Comment: quit 20 yrs ago   Drug use: No   Sexual activity: Yes    Partners: Female  Other Topics Concern   Not on file  Social History Narrative   12th grade education. H/O substance abuse- now abstemious and works with other in recovery..Attends NA on a regular basis and serves as a sponsor. Married 18 years-divorced. Married 5 years 2nd marriage.3 daughters previous marriage, 9 grandchildren, 3 great-grands. Work- Merchant navy officer            Social Determinants of Radio broadcast assistant Strain: Not on [employer] Insecurity: Not on file  Transportation Needs: Not on file  Physical Activity: Not on file  Stress: Not on file  Social Connections: Not on file  Intimate Partner Violence: Not on file    Current Outpatient Medications on File Prior to Visit  Medication Sig Dispense Refill   aspirin EC 81 MG tablet Take 81 mg daily by mouth.     atorvastatin (LIPITOR) 20 MG tablet Take 1 tablet (20 mg total) by mouth daily. 90 tablet 3   Blood Pressure Monitoring (BLOOD PRESSURE KIT) DEVI 1 kit  by Does not apply route daily. 1 each 0   brimonidine (ALPHAGAN) 0.2 % ophthalmic solution SMARTSIG:In Eye(s)     Calcium Carbonate-Vitamin D (CALTRATE 600+D) 600-400 MG-UNIT per tablet Take 1 tablet by mouth 2 (two) times daily. 180 tablet 3   Cholecalciferol (VITAMIN D) 50 MCG (2000 UT) tablet Take 2,000 Units by mouth daily.     dapagliflozin propanediol (FARXIGA) 10 MG TABS tablet Take 10 mg by mouth daily.     hydrochlorothiazide (MICROZIDE) 12.5 MG capsule Take 12.5 mg by mouth daily.     Lancets (ONETOUCH DELICA PLUS [id number]) MISC CHECK BLOOD SUGAR TWICE  DAILY 200 each 3   latanoprost (XALATAN) 0.005 % ophthalmic solution SMARTSIG:In Eye(s)     levothyroxine (SYNTHROID) 25 MCG tablet Take 1 tablet (25 mcg total) by mouth daily before breakfast. 90 tablet 3   linaclotide (LINZESS) 145 MCG CAPS capsule Take 1 capsule (145 mcg total) by  mouth daily before breakfast. 90 capsule 3   metoprolol succinate (TOPROL XL) 25 MG 24 hr tablet Take 1 tablet (25 mg total) by mouth daily. 90 tablet 3   Multiple Vitamin (MULTIVITAMIN WITH MINERALS) TABS tablet Take 1 tablet by mouth daily.     Omega-3 Fatty Acids (FISH OIL) 300 MG CAPS Take 1 capsule (300 mg total) by mouth daily. 90 capsule 3   pioglitazone (ACTOS) 30 MG tablet Take 1 tablet (30 mg total) by mouth daily. 90 tablet 3   polyethylene glycol powder (GLYCOLAX/MIRALAX) 17 GM/SCOOP powder Take 17 g by mouth 2 (two) times daily as needed. 255 g 0   potassium chloride 20 MEQ TBCR Take 20 mEq by mouth 2 (two) times daily. 180 tablet 3   RYBELSUS 14 MG TABS TAKE 1 TABLET BY MOUTH  DAILY 90 tablet 3   triamcinolone ointment (KENALOG) 0.5 % APPLY TO AFFECTED AREA(S)  TOPICALLY TWICE DAILY 90 g 0   ONETOUCH VERIO test strip TEST TWICE DAILY 200 strip 3   No current facility-administered medications on file prior to visit.    Allergies  Allergen Reactions   Clindamycin/Lincomycin Swelling    Possible facial and lip swelling   Ace Inhibitors     Facial and lip swelling possibly associated    Family History  Problem Relation Age of Onset   Hypertension Mother    COPD Mother    Hypertension Brother    Cancer Brother        leukemia   Hypertension Brother    Diabetes Neg Hx     BP (!) 154/90   Pulse (!) 112   Ht 6' (1.829 m)   Wt 165 lb 12.8 oz (75.2 kg)   SpO2 96%   BMI 22.49 kg/m    Review of Systems     Objective:   Physical Exam Pulses: dorsalis pedis intact bilat.   MSK: no deformity of the feet CV: no leg edema Skin:  no ulcer on the feet.  normal color and temp on the feet. Neuro: sensation is intact to touch on the feet Ext: there is bilateral onychomycosis of the toenails.        Assessment & Plan:  Insulin-requiring type 2 DM: uncontrolled Hypoglycemia, due to insulin: this limits aggressiveness of glycemic control  Patient Instructions  I have  sent a prescription to your pharmacy, to change to humalog, with breakfast and with supper check your blood sugar twice a day.  vary the time of day when you check, between before the 3 meals, and at bedtime.  also check if you have symptoms of your blood sugar being too high or too low.  please keep a record of the readings and bring it to your next appointment here (or you can bring the meter itself).  You can write it on any piece of paper.  please call us sooner if your blood sugar goes below 70, or if you have a lot of readings over 200.   Please come back for a follow-up appointment in 6 weeks.

## 2021-06-03 NOTE — Patient Instructions (Addendum)
I have sent a prescription to your pharmacy, to change to humalog, with breakfast and with supper check your blood sugar twice a day.  vary the time of day when you check, between before the 3 meals, and at bedtime.  also check if you have symptoms of your blood sugar being too high or too low.  please keep a record of the readings and bring it to your next appointment here (or you can bring the meter itself).  You can write it on any piece of paper.  please call us sooner if your blood sugar goes below 70, or if you have a lot of readings over 200.   Please come back for a follow-up appointment in 6 weeks.

## 2021-06-04 DIAGNOSIS — I10 Essential (primary) hypertension: Secondary | ICD-10-CM

## 2021-06-04 DIAGNOSIS — Z794 Long term (current) use of insulin: Secondary | ICD-10-CM

## 2021-06-04 DIAGNOSIS — E1169 Type 2 diabetes mellitus with other specified complication: Secondary | ICD-10-CM

## 2021-06-04 DIAGNOSIS — E119 Type 2 diabetes mellitus without complications: Secondary | ICD-10-CM | POA: Diagnosis not present

## 2021-06-04 DIAGNOSIS — I480 Paroxysmal atrial fibrillation: Secondary | ICD-10-CM | POA: Diagnosis not present

## 2021-06-04 DIAGNOSIS — E785 Hyperlipidemia, unspecified: Secondary | ICD-10-CM | POA: Diagnosis not present

## 2021-06-05 ENCOUNTER — Telehealth: Payer: Self-pay

## 2021-06-05 NOTE — Telephone Encounter (Signed)
Michael Snyder  MR# [id number] need help getting his G-6  / Dr. Everardo All

## 2021-06-06 ENCOUNTER — Other Ambulatory Visit: Payer: Self-pay | Admitting: Endocrinology

## 2021-06-06 DIAGNOSIS — E119 Type 2 diabetes mellitus without complications: Secondary | ICD-10-CM

## 2021-06-06 MED ORDER — FREESTYLE LIBRE 2 READER DEVI: 1.0000 | Freq: Once | 1 refills | Status: AC

## 2021-06-06 MED ORDER — FREESTYLE LIBRE 2 SENSOR MISC: 1.0000 | 3 refills | Status: DC

## 2021-06-06 NOTE — Telephone Encounter (Signed)
Message has been sent to pt to let him know that Michael Snyder changed him from the dexcom to freestyle libre.

## 2021-06-09 ENCOUNTER — Other Ambulatory Visit (HOSPITAL_COMMUNITY): Payer: Self-pay

## 2021-06-09 ENCOUNTER — Telehealth: Payer: Self-pay | Admitting: Endocrinology

## 2021-06-09 NOTE — Telephone Encounter (Signed)
Pharm Rep called to verify pts medications and needle sizing. [phone] please use ref. # [id number].

## 2021-06-10 ENCOUNTER — Other Ambulatory Visit: Payer: Self-pay | Admitting: Endocrinology

## 2021-06-10 ENCOUNTER — Telehealth: Payer: Self-pay | Admitting: Pharmacy Technician

## 2021-06-10 ENCOUNTER — Other Ambulatory Visit: Payer: Self-pay

## 2021-06-10 ENCOUNTER — Other Ambulatory Visit (HOSPITAL_COMMUNITY): Payer: Self-pay

## 2021-06-10 ENCOUNTER — Telehealth: Payer: Self-pay | Admitting: Endocrinology

## 2021-06-10 DIAGNOSIS — E119 Type 2 diabetes mellitus without complications: Secondary | ICD-10-CM

## 2021-06-10 MED ORDER — FREESTYLE LIBRE 2 SENSOR MISC: 1.0000 | 3 refills | Status: DC

## 2021-06-10 MED ORDER — DEXCOM G6 SENSOR MISC: 3 refills | Status: DC

## 2021-06-10 NOTE — Telephone Encounter (Signed)
Patient Advocate Encounter  Received fax from CoverMyMeds that pa was required for [facility].   Per Test Claim: CGM N/A at pharmacy; for Lurlean Nanny, MD call [facility] [phone], Corliss Blacker [phone], Felisa Bonier [phone]  Messaged Doctor to: Please send to one of these DME providers. Thanks.

## 2021-06-10 NOTE — Telephone Encounter (Signed)
error 

## 2021-06-10 NOTE — Telephone Encounter (Signed)
[facility] needs to verify the dosage on the following:insulin lispro (HUMALOG KWIKPEN) 100 UNIT/ML KwikPen [[id number]] . Please call them back at [phone] REFERENCE# [id number]

## 2021-06-11 ENCOUNTER — Other Ambulatory Visit: Payer: Self-pay

## 2021-06-11 DIAGNOSIS — E119 Type 2 diabetes mellitus without complications: Secondary | ICD-10-CM

## 2021-06-11 MED ORDER — PEN NEEDLES 32G X 4 MM MISC: 5 refills | Status: DC

## 2021-06-11 NOTE — Telephone Encounter (Signed)
Spoke with [facility] regarding insulin and sent a script over for pen needles.

## 2021-06-11 NOTE — Telephone Encounter (Signed)
This has been addressed.

## 2021-06-12 ENCOUNTER — Telehealth: Payer: Self-pay | Admitting: *Deleted

## 2021-06-12 NOTE — Telephone Encounter (Signed)
   Pre-operative Risk Assessment    Patient Name: Michael Snyder  DOB: 1951-04-15 MRN: [id number]      Request for Surgical Clearance   Procedure:   COLONOSCOPY  Date of Surgery: Clearance 06/20/21                                 Surgeon:  DR. MANN Surgeon's Group or Practice Name:  [facility], [place] Phone number:  [phone] Fax number:  [phone]   Type of Clearance Requested: - Medical  - Pharmacy:  Hold Aspirin     Type of Anesthesia:   PROPOFOL   Additional requests/questions:   Elpidio Anis   06/12/2021, 2:54 PM

## 2021-06-12 NOTE — Telephone Encounter (Signed)
Message sent to EP scheduler [place] to reach out to the pt with an appt, per pre op provider.

## 2021-06-12 NOTE — Telephone Encounter (Signed)
   Name: Elizer Bostic  DOB: September 27, 1950  MRN: [id number]  Primary Cardiologist: EP - Dr. Graciela Husbands  Chart reviewed as part of pre-operative protocol coverage. Because of Lofton Whistler's past medical history and time since last visit, he will require a follow-up visit in order to better assess preoperative cardiovascular risk.  Patient was contacted 06/12/2021 in reference to pre-operative risk assessment for pending surgery as outlined below.  Olney Monier was last seen 03/2021 by Otilio Saber PA-C. Cardiology follows for history of pacemaker, paroxysmal atrial fibrillation, and infrequent NSVT. Prior echo showed EF 55-60%, grade 1 DD, moderate TR, mild dilation of aorta. Patient previously declined anticoagulation.  He states his blood pressure has been running high along with his HR. Last VS 06/03/21 showed BP 154/90 and pulse 112. The tachycardia appears new from last cardiology OV. See if we can get in with EP so they can also interrogate his device.  Pre-op covering staff: - Please schedule appointment and call patient to inform them. - Please contact requesting surgeon's office via preferred method (i.e, phone, fax) to inform them of need for appointment prior to surgery.  If clinically stable at f/u OV there is no hx of PCI, CABG, MI that would preclude holding ASA from cardiac standpoint.  Laurann Montana, PA-C  06/12/2021, 5:12 PM

## 2021-06-13 NOTE — Telephone Encounter (Signed)
Pt has been scheduled to see Francis Dowse, [facility] 06/18/21 for pre op clearance. Will forward  notes to [facility] for appt. Will send FYI to requesting office pt has appt 06/18/21

## 2021-06-16 NOTE — Progress Notes (Signed)
Cardiology Office Note Date:  06/18/2021  Patient ID:  Michael Snyder, DOB 20-Jun-1951, MRN [id number] PCP:  Hoyt Koch, MD  Electrophysiologist: Dr. Caryl Comes    Chief Complaint:  pre-op  History of Present Illness: Michael Snyder is a 70 y.o. 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 Caryl Comes, last seen by him Oct 2020, at that time doing well, no AFib, no changes were made.  Most recently he saw A. Tiller, PA-C 03/24/2021, DM was poorly controlled, he was following closely his PMD/endo for this.  He had an ATach and infrequent NSVT his amlodipine stopped and started on Toprol, planned for f/u labs for recent hypokalemia noted   He needs pre-op clearance for a colonoscopy   TODAY He denies to me any cardiac concerns, no CP, palpitations or cardiac awareness Says if not for the clearance he would not have reached out for an appt. No dizzy spells, near syncope or syncope. 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 Feels like he has great exertional capacity Has a 71 year old mastiff/pit mix that he walks daily as well  DUKE score is 8.97METS RCRI score is 2, 6.6%  Device information MDT dual chamber PPM implanted 01/14/2007, RV lead extraction, new RV lead and gen change 03/23/2014   Past Medical History:  Diagnosis Date   Alcohol abuse    Arthritis    Diabetes mellitus, type 2 ([place])    Head injury, closed, without LOC 1980s   History of alcohol abuse    Hyperlipidemia    Hypertension    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   Pacemaker    Medtronic Adapta ADDR01   Seizure disorder ([place])    Seizures ([place])    last time in 1990's    Past  Surgical History:  Procedure Laterality Date   LACERATION REPAIR  1980   scalp   PACEMAKER LEAD REMOVAL N/A 03/23/2014   Procedure: PACEMAKER LEAD REMOVAL;  Surgeon: Evans Lance, MD;  Location: [facility];  Service: Cardiovascular;  Laterality: N/A;   PTDVP     7/08 medtronic DDD    Current Outpatient Medications  Medication Sig Dispense Refill   aspirin EC 81 MG tablet Take 81 mg daily by mouth.     atorvastatin (LIPITOR) 20 MG tablet Take 1 tablet (20 mg total) by mouth daily. 90 tablet 3   Blood Pressure Monitoring (BLOOD PRESSURE KIT) DEVI 1 kit by Does not apply route daily. 1 each 0   brimonidine (ALPHAGAN) 0.2 % ophthalmic solution SMARTSIG:In Eye(s)     Calcium Carbonate-Vitamin D (CALTRATE 600+D) 600-400 MG-UNIT per tablet Take 1 tablet by mouth 2 (two) times daily. 180 tablet 3   Cholecalciferol (VITAMIN D) 50 MCG (2000 UT) tablet Take 2,000 Units by mouth daily.     Continuous Blood Gluc Sensor (DEXCOM G6 SENSOR) MISC Change every 10 days 9 each 3   Continuous Blood Gluc Sensor (FREESTYLE LIBRE 2 SENSOR) MISC 1 Device by Does not apply route every 14 (fourteen) days. 6 each 3   dapagliflozin propanediol (FARXIGA) 10 MG TABS tablet Take 10 mg by mouth daily.     insulin lispro (  HUMALOG KWIKPEN) 100 UNIT/ML KwikPen Inject 5 Units into the skin 2 (two) times daily with a meal. Breakfast and supper; and pen needles 2/day 15 mL 3   Insulin Pen Needle (PEN NEEDLES) 32G X 4 MM MISC Use as Directed 100 each 5   Lancets (ONETOUCH DELICA PLUS [id number]) MISC CHECK BLOOD SUGAR TWICE  DAILY 200 each 3   latanoprost (XALATAN) 0.005 % ophthalmic solution SMARTSIG:In Eye(s)     levothyroxine (SYNTHROID) 25 MCG tablet Take 1 tablet (25 mcg total) by mouth daily before breakfast. 90 tablet 3   linaclotide (LINZESS) 145 MCG CAPS capsule Take 1 capsule (145 mcg total) by mouth daily before breakfast. 90 capsule 3   metoprolol succinate (TOPROL XL) 25 MG 24 hr tablet Take 1 tablet (25 mg total) by  mouth daily. 90 tablet 3   Multiple Vitamin (MULTIVITAMIN WITH MINERALS) TABS tablet Take 1 tablet by mouth daily.     Omega-3 Fatty Acids (FISH OIL) 300 MG CAPS Take 1 capsule (300 mg total) by mouth daily. 90 capsule 3   ONETOUCH VERIO test strip TEST TWICE DAILY 200 strip 3   pioglitazone (ACTOS) 30 MG tablet Take 1 tablet (30 mg total) by mouth daily. 90 tablet 3   polyethylene glycol powder (GLYCOLAX/MIRALAX) 17 GM/SCOOP powder Take 17 g by mouth 2 (two) times daily as needed. 255 g 0   potassium chloride 20 MEQ TBCR Take 20 mEq by mouth 2 (two) times daily. 180 tablet 3   RYBELSUS 14 MG TABS TAKE 1 TABLET BY MOUTH  DAILY 90 tablet 3   triamcinolone ointment (KENALOG) 0.5 % APPLY TO AFFECTED AREA(S)  TOPICALLY TWICE DAILY 90 g 0   hydrochlorothiazide (MICROZIDE) 12.5 MG capsule Take 1 capsule (12.5 mg total) by mouth daily. 90 capsule 3   No current facility-administered medications for this visit.    Allergies:   Clindamycin/lincomycin and Ace inhibitors   Social History:  The patient  reports that he has never smoked. He has never used smokeless tobacco. He reports that he does not drink alcohol and does not use drugs.   Family History:  The patient's family history includes COPD in his mother; Cancer in his brother; Hypertension in his brother, brother, and mother.  ROS:  Please see the history of present illness.    All other systems are reviewed and otherwise negative.   PHYSICAL EXAM:  VS:  BP 118/70   Pulse (!) 46   Ht 6' (1.829 m)   Wt 168 lb 6.4 oz (76.4 kg)   SpO2 100%   BMI 22.84 kg/m  BMI: Body mass index is 22.84 kg/m. Well nourished, well developed, in no acute distress HEENT: normocephalic, atraumatic Neck: no JVD, carotid bruits or masses Cardiac:  RRR; no significant murmurs, no rubs, or gallops Lungs:  CTA b/l, no wheezing, rhonchi or rales Abd: soft, nontender MS: no deformity or atrophy Ext: no edema Skin: warm and dry, no rash Neuro:  No gross  deficits appreciated Psych: euthymic mood, full affect  PPM site is stable, no tethering or discomfort   EKG:  Done today and reviewed by myself shows  SR, 1st degree AVblock, 78bpm  Device interrogation done today and reviewed by myself:  Battery and lead measurements are good  AMS< longest is 2 min, looks like PACs, full EGM not available One AFib 35seconds Burden <0.1% No HVR episodes   03/01/2020 TTE IMPRESSIONS   1. Left ventricular ejection fraction, by estimation, is 55 to 60%. The  left ventricle has normal function. The left ventricle has no regional  wall motion abnormalities. There is mild concentric left ventricular  hypertrophy. Left ventricular diastolic  parameters are consistent with Grade I diastolic dysfunction (impaired  relaxation). The average left ventricular global longitudinal strain is  -15.1 %.   2. Right ventricular systolic function is normal. The right ventricular  size is normal. There is normal pulmonary artery systolic pressure.   3. The mitral valve is normal in structure. Trivial mitral valve  regurgitation. No evidence of mitral stenosis.   4. Tricuspid valve regurgitation is moderate.   5. The aortic valve is tricuspid. Aortic valve regurgitation is not  visualized. No aortic stenosis is present.   6. Aortic dilatation noted. There is mild dilatation of the ascending  aorta measuring 37 mm.   7. The inferior vena cava is normal in size with greater than 50%  respiratory variability, suggesting right atrial pressure of 3 mmHg.   Recent Labs: 08/02/2020: TSH 4.65 03/13/2021: ALT 29; Hemoglobin 12.6; Platelets 147.0 04/01/2021: BUN 18; Creatinine, Ser 1.73; Potassium 4.3; Sodium 139  03/13/2021: Cholesterol 119; HDL 41.30; LDL Cholesterol 57; Total CHOL/HDL Ratio 3; Triglycerides 107.0; VLDL 21.4   CrCl cannot be calculated (Patient's most recent lab result is older than the maximum 21 days allowed.).   Wt Readings from Last 3 Encounters:   06/18/21 168 lb 6.4 oz (76.4 kg)  06/03/21 165 lb 12.8 oz (75.2 kg)  04/30/21 160 lb 12.8 oz (72.9 kg)     Other studies reviewed: Additional studies/records reviewed today include: summarized above  ASSESSMENT AND PLAN:  PPM Intact function No programming changes made  Paroxysmal AFib (?), Atach CHA2DS2Vasc is 4 Historically on Xarelto, pt preference, stopped > ASA <0.1 % burden Discussed if burden increases we would need to revisit a/c  HTN Looks good  NSVT None noted  5. Pre-op (colonoscopy) Low risk patient for a low risk procedure No cardiac contraindication to a colonoscopy No particular pacer management required    Disposition: F/u with remotes as usual, in clinic in a year, sooner if needed  Current medicines are reviewed at length with the patient today.  The patient did not have any concerns regarding medicines.  Venetia Night, PA-C 06/18/2021 10:47 AM     [facility] [place] [address] [phone] (office)  [phone] (fax)

## 2021-06-18 ENCOUNTER — Encounter: Payer: Self-pay | Admitting: Physician Assistant

## 2021-06-18 ENCOUNTER — Ambulatory Visit: Payer: Medicare Other | Admitting: Physician Assistant

## 2021-06-18 ENCOUNTER — Other Ambulatory Visit: Payer: Self-pay

## 2021-06-18 VITALS — BP 118/70 | HR 46 | Ht 72.0 in | Wt 168.4 lb

## 2021-06-18 DIAGNOSIS — Z01818 Encounter for other preprocedural examination: Secondary | ICD-10-CM

## 2021-06-18 DIAGNOSIS — I4729 Other ventricular tachycardia: Secondary | ICD-10-CM | POA: Diagnosis not present

## 2021-06-18 DIAGNOSIS — I10 Essential (primary) hypertension: Secondary | ICD-10-CM | POA: Diagnosis not present

## 2021-06-18 DIAGNOSIS — I495 Sick sinus syndrome: Secondary | ICD-10-CM | POA: Diagnosis not present

## 2021-06-18 DIAGNOSIS — I480 Paroxysmal atrial fibrillation: Secondary | ICD-10-CM | POA: Diagnosis not present

## 2021-06-18 DIAGNOSIS — Z950 Presence of cardiac pacemaker: Secondary | ICD-10-CM

## 2021-06-18 DIAGNOSIS — Z01810 Encounter for preprocedural cardiovascular examination: Secondary | ICD-10-CM | POA: Diagnosis not present

## 2021-06-18 MED ORDER — HYDROCHLOROTHIAZIDE 12.5 MG PO CAPS: 12.5000 mg | ORAL_CAPSULE | Freq: Every day | ORAL | 3 refills | Status: DC

## 2021-06-18 NOTE — Patient Instructions (Signed)
Medication Instructions:   Your physician recommends that you continue on your current medications as directed. Please refer to the Current Medication list given to you today.  *If you need a refill on your cardiac medications before your next appointment, please call your pharmacy*   Lab Work: NONE ORDERED  TODAY   If you have labs (blood work) drawn today and your tests are completely normal, you will receive your results only by: . MyChart Message (if you have MyChart) OR . A paper copy in the mail If you have any lab test that is abnormal or we need to change your treatment, we will call you to review the results.   Testing/Procedures: NONE ORDERED  TODAY   Follow-Up: At [facility], you and your health needs are our priority.  As part of our continuing mission to provide you with exceptional heart care, we have created designated Provider Care Teams.  These Care Teams include your primary Cardiologist (physician) and Advanced Practice Providers (APPs -  Physician Assistants and Nurse Practitioners) who all work together to provide you with the care you need, when you need it.  We recommend signing up for the patient portal called "MyChart".  Sign up information is provided on this After Visit Summary.  MyChart is used to connect with patients for Virtual Visits (Telemedicine).  Patients are able to view lab/test results, encounter notes, upcoming appointments, etc.  Non-urgent messages can be sent to your provider as well.   To learn more about what you can do with MyChart, go to [email or web address].    Your next appointment:   1 year(s)  The format for your next appointment:   In Person  Provider:   Steven Klein, MD   Other Instructions   

## 2021-06-19 ENCOUNTER — Telehealth: Payer: Self-pay

## 2021-06-19 ENCOUNTER — Other Ambulatory Visit: Payer: Self-pay | Admitting: Endocrinology

## 2021-06-19 NOTE — Chronic Care Management (AMB) (Signed)
° ° °Chronic Care Management °Pharmacy Assistant  ° °Name: Michael Snyder  MRN: [id number] DOB: 08/22/1950 ° °Reason for Encounter: Disease State-General Assessment °  ° °Recent office visits:  °None ID ° °Recent consult visits:  °06/03/21 Ellison, Sean, MD-Endocrinology (Diabetes mellitus type 2 with complications) Med changes: Insulin Lispro 5 units ° °Hospital visits:  °None in previous 6 months ° °Medications: °Outpatient Encounter Medications as of 06/19/2021  °Medication Sig  ° aspirin EC 81 MG tablet Take 81 mg daily by mouth.  ° atorvastatin (LIPITOR) 20 MG tablet Take 1 tablet (20 mg total) by mouth daily.  ° Blood Pressure Monitoring (BLOOD PRESSURE KIT) DEVI 1 kit by Does not apply route daily.  ° brimonidine (ALPHAGAN) 0.2 % ophthalmic solution SMARTSIG:In Eye(s)  ° Calcium Carbonate-Vitamin D (CALTRATE 600+D) 600-400 MG-UNIT per tablet Take 1 tablet by mouth 2 (two) times daily.  ° Cholecalciferol (VITAMIN D) 50 MCG (2000 UT) tablet Take 2,000 Units by mouth daily.  ° Continuous Blood Gluc Sensor (DEXCOM G6 SENSOR) MISC Change every 10 days  ° Continuous Blood Gluc Sensor (FREESTYLE LIBRE 2 SENSOR) MISC 1 Device by Does not apply route every 14 (fourteen) days.  ° dapagliflozin propanediol (FARXIGA) 10 MG TABS tablet Take 10 mg by mouth daily.  ° hydrochlorothiazide (MICROZIDE) 12.5 MG capsule Take 1 capsule (12.5 mg total) by mouth daily.  ° insulin lispro (HUMALOG KWIKPEN) 100 UNIT/ML KwikPen Inject 5 Units into the skin 2 (two) times daily with a meal. Breakfast and supper; and pen needles 2/day  ° Insulin Pen Needle (PEN NEEDLES) 32G X 4 MM MISC Use as Directed  ° Lancets (ONETOUCH DELICA PLUS LANCET33G) MISC CHECK BLOOD SUGAR TWICE  DAILY  ° latanoprost (XALATAN) 0.005 % ophthalmic solution SMARTSIG:In Eye(s)  ° levothyroxine (SYNTHROID) 25 MCG tablet Take 1 tablet (25 mcg total) by mouth daily before breakfast.  ° linaclotide (LINZESS) 145 MCG CAPS capsule Take 1 capsule (145 mcg total) by mouth  daily before breakfast.  ° metoprolol succinate (TOPROL XL) 25 MG 24 hr tablet Take 1 tablet (25 mg total) by mouth daily.  ° Multiple Vitamin (MULTIVITAMIN WITH MINERALS) TABS tablet Take 1 tablet by mouth daily.  ° Omega-3 Fatty Acids (FISH OIL) 300 MG CAPS Take 1 capsule (300 mg total) by mouth daily.  ° ONETOUCH VERIO test strip TEST TWICE DAILY  ° pioglitazone (ACTOS) 30 MG tablet Take 1 tablet (30 mg total) by mouth daily.  ° polyethylene glycol powder (GLYCOLAX/MIRALAX) 17 GM/SCOOP powder Take 17 g by mouth 2 (two) times daily as needed.  ° potassium chloride 20 MEQ TBCR Take 20 mEq by mouth 2 (two) times daily.  ° RYBELSUS 14 MG TABS TAKE 1 TABLET BY MOUTH  DAILY  ° triamcinolone ointment (KENALOG) 0.5 % APPLY TO AFFECTED AREA(S)  TOPICALLY TWICE DAILY  ° °No facility-administered encounter medications on file as of 06/19/2021.  ° °Have you had any problems recently with your health? Patient states that he is doing well and does not have any new health issues. He did mention that he will be having a colonoscopy tomorrow. ° °Have you had any problems with your pharmacy?Patient states that he does not have any problems with getting his medications from the pharmacy. He did say that the cost of the freestyle sensor cost him about $200 ° °What issues or side effects are you having with your medications?Patient states that he does not have any side effects to any medications ° °What would you like me to pass   along to Daniel Szabat,CPP for them to help you with? Patient states that he would like to get some help paying for the freestyle sensor if available ° °What can we do to take care of you better? Patient states that he does not need anything else beside getting help for his sensor at this time ° °Care Gaps: °Colonoscopy-12/15/17 °Diabetic Foot Exam-04/30/21 °Ophthalmology-06/07/17 °Dexa Scan - NA °Annual Well Visit - NA °Micro albumin-03/13/21 °Hemoglobin A1c- 03/13/21 ° °Star Rating Drugs: °Farxiga 10 mg-last fill  04/16/21 30 ds °Atorvastatin 20 mg-last fill 03/24/21 90 ds  ° °Tracy Ellis,CMA °Clinical Pharmacist Assistant °[phone]  °

## 2021-06-20 DIAGNOSIS — Z86010 Personal history of colonic polyps: Secondary | ICD-10-CM | POA: Diagnosis not present

## 2021-06-20 DIAGNOSIS — Z1211 Encounter for screening for malignant neoplasm of colon: Secondary | ICD-10-CM | POA: Diagnosis not present

## 2021-06-26 ENCOUNTER — Other Ambulatory Visit: Payer: Self-pay | Admitting: Endocrinology

## 2021-06-26 ENCOUNTER — Other Ambulatory Visit: Payer: Self-pay | Admitting: Internal Medicine

## 2021-07-04 ENCOUNTER — Other Ambulatory Visit: Payer: Self-pay | Admitting: Internal Medicine

## 2021-07-05 ENCOUNTER — Other Ambulatory Visit: Payer: Self-pay | Admitting: Endocrinology

## 2021-07-08 ENCOUNTER — Telehealth: Payer: Self-pay | Admitting: Endocrinology

## 2021-07-08 DIAGNOSIS — E119 Type 2 diabetes mellitus without complications: Secondary | ICD-10-CM

## 2021-07-08 MED ORDER — DEXCOM G6 TRANSMITTER MISC: 3 refills | Status: DC

## 2021-07-08 MED ORDER — DEXCOM G6 RECEIVER DEVI: 0 refills | Status: DC

## 2021-07-08 MED ORDER — DEXCOM G6 SENSOR MISC: 3 refills | Status: DC

## 2021-07-08 NOTE — Telephone Encounter (Signed)
Pharmacy calling on the behalf of patient. The patient is requesting the Dexcom G6. I only see the below applied to patient account. Continuous Blood Gluc Sensor (DEXCOM G6 SENSOR) MISC  Patient needs the transmitter and receiver too. Please forward new prescription to:  [facility] ([facility] ) - [place], [place] Phone:  [phone]  Fax:  [phone]

## 2021-07-08 NOTE — Telephone Encounter (Signed)
RX for Dexcom sensor, transmitter and receiver has now been sent to [facility].

## 2021-07-12 DIAGNOSIS — E119 Type 2 diabetes mellitus without complications: Secondary | ICD-10-CM | POA: Diagnosis not present

## 2021-07-15 ENCOUNTER — Other Ambulatory Visit: Payer: Self-pay

## 2021-07-15 ENCOUNTER — Ambulatory Visit: Payer: Medicare Other | Admitting: Endocrinology

## 2021-07-15 VITALS — BP 154/100 | HR 85 | Ht 72.0 in | Wt 167.4 lb

## 2021-07-15 DIAGNOSIS — E1165 Type 2 diabetes mellitus with hyperglycemia: Secondary | ICD-10-CM

## 2021-07-15 DIAGNOSIS — E119 Type 2 diabetes mellitus without complications: Secondary | ICD-10-CM

## 2021-07-15 DIAGNOSIS — E1122 Type 2 diabetes mellitus with diabetic chronic kidney disease: Secondary | ICD-10-CM | POA: Diagnosis not present

## 2021-07-15 DIAGNOSIS — E118 Type 2 diabetes mellitus with unspecified complications: Secondary | ICD-10-CM

## 2021-07-15 DIAGNOSIS — N189 Chronic kidney disease, unspecified: Secondary | ICD-10-CM | POA: Diagnosis not present

## 2021-07-15 LAB — POCT GLYCOSYLATED HEMOGLOBIN (HGB A1C): Hemoglobin A1C: 11.3 % — AB (ref 4.0–5.6)

## 2021-07-15 MED ORDER — INSULIN LISPRO (1 UNIT DIAL) 100 UNIT/ML (KWIKPEN): PEN_INJECTOR | SUBCUTANEOUS | 3 refills | Status: DC

## 2021-07-15 NOTE — Progress Notes (Signed)
Subjective:    Patient ID: Michael Snyder, male    DOB: 10/17/1950, 71 y.o.   MRN: [id number]  HPI Pt returns for f/u of diabetes mellitus:  DM type: Insulin-requiring type 2 Dx'ed: 2008 Complications: stage 3 CRI.   Therapy: insulin since 2021, and 3 oral meds.   DKA: never.   Severe hypoglycemia: never.   Pancreatitis: never Pancreatic imaging: CT (2020): Fatty atrophy. Calcification of the ducts, and dilatation of the main duct.   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   Other: fructosamine converts to higher A1c than A1c itself and cbg's; pt meter cbg was similar to office meter; he eats meals at 7AM, 12N, and 8PM.    Interval history: pt states he feels well in general.  He takes insulin as rx'ed. We downloaded continuous glucose monitor, but there are no recent data.  He had hypoglycemia after he took 6 units with breakfast.   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   Pacemaker    Medtronic Adapta ADDR01   Seizure disorder ([facility])    Seizures ([facility])    last time in 1990's    Past Surgical History:  Procedure Laterality Date   LACERATION REPAIR  1980   scalp   PACEMAKER LEAD REMOVAL N/A 03/23/2014   Procedure: PACEMAKER LEAD REMOVAL;  Surgeon: Marinus Maw, MD;  Location: [facility] OR;  Service: Cardiovascular;  Laterality: N/A;   PTDVP     7/08 medtronic DDD    Social History   Socioeconomic History   Marital status: Legally Separated    Spouse name: Not on file   Number of children: 3   Years of education: Not on file   Highest education level: Not on file  Occupational History   Occupation: Music therapist, truck driver  Tobacco Use   Smoking status: Never   Smokeless tobacco: Never  Vaping Use   Vaping Use: Never used  Substance and Sexual Activity   Alcohol use: No     Comment: quit 20 yrs ago   Drug use: No   Sexual activity: Yes    Partners: Female  Other Topics Concern   Not on file  Social History Narrative   12th grade education. H/O substance abuse- now abstemious and works with other in recovery..Attends NA on a regular basis and serves as a sponsor. Married 18 years-divorced. Married 5 years 2nd marriage.3 daughters previous marriage, 9 grandchildren, 3 great-grands. Work- Arts administrator            Social Determinants of Corporate investment banker Strain: Not on [employer] Insecurity: Not on file  Transportation Needs: Not on file  Physical Activity: Not on file  Stress: Not on file  Social Connections: Not on file  Intimate Partner Violence: Not on file    Current Outpatient Medications on File Prior to Visit  Medication Sig Dispense Refill   aspirin EC 81 MG tablet Take 81 mg daily by mouth.     atorvastatin (LIPITOR) 20 MG tablet Take 1 tablet (20 mg total) by mouth daily. 90 tablet 3   Blood Pressure Monitoring (BLOOD PRESSURE KIT) DEVI 1 kit by Does not apply route daily. 1 each  0   brimonidine (ALPHAGAN) 0.2 % ophthalmic solution SMARTSIG:In Eye(s)     Calcium Carbonate-Vitamin D (CALTRATE 600+D) 600-400 MG-UNIT per tablet Take 1 tablet by mouth 2 (two) times daily. 180 tablet 3   Cholecalciferol (VITAMIN D) 50 MCG (2000 UT) tablet Take 2,000 Units by mouth daily.     Continuous Blood Gluc Receiver (DEXCOM G6 RECEIVER) DEVI Use as directed to check blood sugar. 1 each 0   Continuous Blood Gluc Sensor (DEXCOM G6 SENSOR) MISC Change every 10 days 9 each 3   Continuous Blood Gluc Sensor (FREESTYLE LIBRE 2 SENSOR) MISC 1 Device by Does not apply route every 14 (fourteen) days. 6 each 3   Continuous Blood Gluc Transmit (DEXCOM G6 TRANSMITTER) MISC Change every 3 months 1 each 3   dapagliflozin propanediol (FARXIGA) 10 MG TABS tablet Take 10 mg by mouth daily.     hydrochlorothiazide (MICROZIDE) 12.5 MG capsule Take 1  capsule (12.5 mg total) by mouth daily. 90 capsule 3   Insulin Pen Needle (PEN NEEDLES) 32G X 4 MM MISC Use as Directed 100 each 5   Lancets (ONETOUCH DELICA PLUS [id number]) MISC CHECK BLOOD SUGAR TWICE  DAILY 200 each 3   latanoprost (XALATAN) 0.005 % ophthalmic solution SMARTSIG:In Eye(s)     levothyroxine (SYNTHROID) 25 MCG tablet TAKE 1 TABLET BY MOUTH  DAILY BEFORE BREAKFAST 90 tablet 3   LINZESS 145 MCG CAPS capsule TAKE 1 CAPSULE BY MOUTH  DAILY BEFORE BREAKFAST 90 capsule 3   metoprolol succinate (TOPROL XL) 25 MG 24 hr tablet Take 1 tablet (25 mg total) by mouth daily. 90 tablet 3   Multiple Vitamin (MULTIVITAMIN WITH MINERALS) TABS tablet Take 1 tablet by mouth daily.     Omega-3 Fatty Acids (FISH OIL) 300 MG CAPS Take 1 capsule (300 mg total) by mouth daily. 90 capsule 3   ONETOUCH VERIO test strip TEST TWICE DAILY 200 strip 3   pioglitazone (ACTOS) 30 MG tablet TAKE 1 TABLET BY MOUTH  DAILY 90 tablet 3   polyethylene glycol powder (GLYCOLAX/MIRALAX) 17 GM/SCOOP powder Take 17 g by mouth 2 (two) times daily as needed. 255 g 0   potassium chloride 20 MEQ TBCR Take 20 mEq by mouth 2 (two) times daily. 180 tablet 3   RYBELSUS 14 MG TABS TAKE 1 TABLET BY MOUTH  DAILY 90 tablet 0   triamcinolone ointment (KENALOG) 0.5 % APPLY TO AFFECTED AREA(S)  TOPICALLY TWICE DAILY 90 g 0   No current facility-administered medications on file prior to visit.    Allergies  Allergen Reactions   Clindamycin/Lincomycin Swelling    Possible facial and lip swelling   Ace Inhibitors     Facial and lip swelling possibly associated    Family History  Problem Relation Age of Onset   Hypertension Mother    COPD Mother    Hypertension Brother    Cancer Brother        leukemia   Hypertension Brother    Diabetes Neg Hx     BP (!) 154/100    Pulse 85    Ht 6' (1.829 m)    Wt 167 lb 6.4 oz (75.9 kg)    SpO2 99%    BMI 22.70 kg/m    Review of Systems     Objective:   Physical Exam EXT: no leg  edema.     Lab Results  Component Value Date   HGBA1C 11.3 (A) 07/15/2021      Assessment & Plan:  Insulin-requiring  type 2 DM: uncontrolled.   Patient Instructions  I have sent a prescription to your pharmacy, to change to humalog to 3 times a day (just before each meal) 4-4-5 units check your blood sugar twice a day.  vary the time of day when you check, between before the 3 meals, and at bedtime.  also check if you have symptoms of your blood sugar being too high or too low.  please keep a record of the readings and bring it to your next appointment here (or you can bring the meter itself).  You can write it on any piece of paper.  please call us sooner if your blood sugar goes below 70, or if you have a lot of readings over 200.   Please come back for a follow-up appointment in 1 month.

## 2021-07-15 NOTE — Patient Instructions (Addendum)
I have sent a prescription to your pharmacy, to change to humalog to 3 times a day (just before each meal) 4-4-5 units check your blood sugar twice a day.  vary the time of day when you check, between before the 3 meals, and at bedtime.  also check if you have symptoms of your blood sugar being too high or too low.  please keep a record of the readings and bring it to your next appointment here (or you can bring the meter itself).  You can write it on any piece of paper.  please call us sooner if your blood sugar goes below 70, or if you have a lot of readings over 200.   Please come back for a follow-up appointment in 1 month.

## 2021-07-18 ENCOUNTER — Telehealth: Payer: Self-pay

## 2021-07-18 NOTE — Progress Notes (Signed)
Chronic Care Management Pharmacy Assistant   Name: Michael Snyder  MRN: [id number] DOB: Aug 30, 1950   Reason for Encounter: Disease State  Appt: 08/13/21 @ 11am with Linna Hoff  Conditions to be addressed/monitored: DMII   Recent office visits:  None ID  Recent consult visits:  07/15/21 Renato Shin, MD-Endocrinologist (Diabetes mellitus) med changes:change to humalog to 3 times a day  4-4-5 units  06/18/21 Baldwin Jamaica, PA-C-Cardiology (Paroxysmal Afib)  Hospital visits:  None in previous 6 months  Medications: Outpatient Encounter Medications as of 07/18/2021  Medication Sig   aspirin EC 81 MG tablet Take 81 mg daily by mouth.   atorvastatin (LIPITOR) 20 MG tablet Take 1 tablet (20 mg total) by mouth daily.   Blood Pressure Monitoring (BLOOD PRESSURE KIT) DEVI 1 kit by Does not apply route daily.   brimonidine (ALPHAGAN) 0.2 % ophthalmic solution SMARTSIG:In Eye(s)   Calcium Carbonate-Vitamin D (CALTRATE 600+D) 600-400 MG-UNIT per tablet Take 1 tablet by mouth 2 (two) times daily.   Cholecalciferol (VITAMIN D) 50 MCG (2000 UT) tablet Take 2,000 Units by mouth daily.   Continuous Blood Gluc Receiver (DEXCOM G6 RECEIVER) DEVI Use as directed to check blood sugar.   Continuous Blood Gluc Sensor (DEXCOM G6 SENSOR) MISC Change every 10 days   Continuous Blood Gluc Sensor (FREESTYLE LIBRE 2 SENSOR) MISC 1 Device by Does not apply route every 14 (fourteen) days.   Continuous Blood Gluc Transmit (DEXCOM G6 TRANSMITTER) MISC Change every 3 months   dapagliflozin propanediol (FARXIGA) 10 MG TABS tablet Take 10 mg by mouth daily.   hydrochlorothiazide (MICROZIDE) 12.5 MG capsule Take 1 capsule (12.5 mg total) by mouth daily.   insulin lispro (HUMALOG KWIKPEN) 100 UNIT/ML KwikPen 3 times a day (just before each meal) 4-4-5 units, and pen needles 3/day.   Insulin Pen Needle (PEN NEEDLES) 32G X 4 MM MISC Use as Directed   Lancets (ONETOUCH DELICA PLUS [id number]) MISC CHECK BLOOD SUGAR TWICE   DAILY   latanoprost (XALATAN) 0.005 % ophthalmic solution SMARTSIG:In Eye(s)   levothyroxine (SYNTHROID) 25 MCG tablet TAKE 1 TABLET BY MOUTH  DAILY BEFORE BREAKFAST   LINZESS 145 MCG CAPS capsule TAKE 1 CAPSULE BY MOUTH  DAILY BEFORE BREAKFAST   metoprolol succinate (TOPROL XL) 25 MG 24 hr tablet Take 1 tablet (25 mg total) by mouth daily.   Multiple Vitamin (MULTIVITAMIN WITH MINERALS) TABS tablet Take 1 tablet by mouth daily.   Omega-3 Fatty Acids (FISH OIL) 300 MG CAPS Take 1 capsule (300 mg total) by mouth daily.   ONETOUCH VERIO test strip TEST TWICE DAILY   pioglitazone (ACTOS) 30 MG tablet TAKE 1 TABLET BY MOUTH  DAILY   polyethylene glycol powder (GLYCOLAX/MIRALAX) 17 GM/SCOOP powder Take 17 g by mouth 2 (two) times daily as needed.   potassium chloride 20 MEQ TBCR Take 20 mEq by mouth 2 (two) times daily.   RYBELSUS 14 MG TABS TAKE 1 TABLET BY MOUTH  DAILY   triamcinolone ointment (KENALOG) 0.5 % APPLY TO AFFECTED AREA(S)  TOPICALLY TWICE DAILY   No facility-administered encounter medications on file as of 07/18/2021.   Recent Relevant Labs: Lab Results  Component Value Date/Time   HGBA1C 11.3 (A) 07/15/2021 08:24 AM   HGBA1C 11.5 (A) 04/30/2021 07:52 AM   HGBA1C 12.5 (H) 03/13/2021 10:21 AM   HGBA1C 10.6 (H) 04/20/2019 08:55 AM   MICROALBUR 2.1 (H) 03/13/2021 10:21 AM   MICROALBUR 0.9 06/17/2020 03:40 PM    Kidney Function Lab Results  Component  Value Date/Time   CREATININE 1.73 (H) 04/01/2021 12:09 PM   CREATININE 1.80 (H) 03/24/2021 09:04 AM   CREATININE 1.63 (H) 05/02/2015 11:06 AM   GFR 38.42 (L) 03/13/2021 10:21 AM   GFRNONAA 45 (L) 07/14/2020 06:23 PM   GFRAA 54 (L) 11/29/2018 03:11 PM    Current antihyperglycemic regimen:  Humalog 100 units 3 times daily Rybelsus 14 mg Pioglitazone 30 mg Farxiga 10 mg  What recent interventions/DTPs have been made to improve glycemic control:  check your blood sugar twice a day.  vary the time of day when you check,  between before the 3 meals, and at bedtime.  also check if you have symptoms of your blood sugar being too high or too low, per Dr Loanne Drilling 07/15/21  Have there been any recent hospitalizations or ED visits since last visit with CPP? No  Patient reports hypoglycemic symptoms, including  Dizziness when bs drops below 70  Patient reports hyperglycemic symptoms, including fatigue  How often are you checking your blood sugar? twice daily  What are your blood sugars ranging?  200-300 Fasting: 232 this morning, took shot came down to 223  During the week, how often does your blood glucose drop below 70? Patient states Tuesday blood sugar dropped to 45 and he felt dizzy and light headed   Are you checking your feet daily/regularly? Patient states that he does not have any issues with his feet  Adherence Review: Is the patient currently on a STATIN medication? Yes Is the patient currently on ACE/ARB medication? No Does the patient have >5 day gap between last estimated fill dates? No   Care Gaps: Colonoscopy-06/20/21 Diabetic Foot Exam-04/30/21 Ophthalmology-06/07/17 Dexa Scan - NA Annual Well Visit - NA Micro albumin-03/13/21 Hemoglobin A1c- 07/15/21  Star Rating Drugs: Farxiga 10 mg-last fill 07/18/21 90 ds Atorvastatin 20 mg-last fill 03/24/21 90 ds  Pioglitazone 30 mg-last fill 07/14/21 90 ds  Ethelene Hal Clinical Pharmacist Assistant [phone]

## 2021-07-22 DIAGNOSIS — N1832 Chronic kidney disease, stage 3b: Secondary | ICD-10-CM | POA: Diagnosis not present

## 2021-07-29 DIAGNOSIS — E876 Hypokalemia: Secondary | ICD-10-CM | POA: Diagnosis not present

## 2021-07-29 DIAGNOSIS — N281 Cyst of kidney, acquired: Secondary | ICD-10-CM | POA: Diagnosis not present

## 2021-07-29 DIAGNOSIS — N1832 Chronic kidney disease, stage 3b: Secondary | ICD-10-CM | POA: Diagnosis not present

## 2021-07-29 DIAGNOSIS — I129 Hypertensive chronic kidney disease with stage 1 through stage 4 chronic kidney disease, or unspecified chronic kidney disease: Secondary | ICD-10-CM | POA: Diagnosis not present

## 2021-07-29 DIAGNOSIS — E559 Vitamin D deficiency, unspecified: Secondary | ICD-10-CM | POA: Diagnosis not present

## 2021-07-29 DIAGNOSIS — I495 Sick sinus syndrome: Secondary | ICD-10-CM | POA: Diagnosis not present

## 2021-07-29 DIAGNOSIS — E1122 Type 2 diabetes mellitus with diabetic chronic kidney disease: Secondary | ICD-10-CM | POA: Diagnosis not present

## 2021-07-29 DIAGNOSIS — N200 Calculus of kidney: Secondary | ICD-10-CM | POA: Diagnosis not present

## 2021-08-01 ENCOUNTER — Ambulatory Visit (INDEPENDENT_AMBULATORY_CARE_PROVIDER_SITE_OTHER): Payer: Medicare Other

## 2021-08-01 DIAGNOSIS — I495 Sick sinus syndrome: Secondary | ICD-10-CM

## 2021-08-03 LAB — CUP PACEART REMOTE DEVICE CHECK
Battery Impedance: 452 Ohm
Battery Remaining Longevity: 104 mo
Battery Voltage: 2.78 V
Brady Statistic AP VP Percent: 0 %
Brady Statistic AP VS Percent: 9 %
Brady Statistic AS VP Percent: 0 %
Brady Statistic AS VS Percent: 90 %
Date Time Interrogation Session: 20230127165654
Implantable Lead Implant Date: 20080711
Implantable Lead Implant Date: 20150918
Implantable Lead Location: 753859
Implantable Lead Location: 753860
Implantable Lead Model: 5076
Implantable Lead Model: 5076
Implantable Pulse Generator Implant Date: 20150918
Lead Channel Impedance Value: 515 Ohm
Lead Channel Impedance Value: 518 Ohm
Lead Channel Pacing Threshold Amplitude: 0.75 V
Lead Channel Pacing Threshold Amplitude: 0.875 V
Lead Channel Pacing Threshold Pulse Width: 0.4 ms
Lead Channel Pacing Threshold Pulse Width: 0.4 ms
Lead Channel Setting Pacing Amplitude: 2 V
Lead Channel Setting Pacing Amplitude: 2.5 V
Lead Channel Setting Pacing Pulse Width: 0.4 ms
Lead Channel Setting Sensing Sensitivity: 2 mV

## 2021-08-11 ENCOUNTER — Other Ambulatory Visit: Payer: Self-pay

## 2021-08-11 DIAGNOSIS — E119 Type 2 diabetes mellitus without complications: Secondary | ICD-10-CM | POA: Diagnosis not present

## 2021-08-11 NOTE — Progress Notes (Signed)
Remote pacemaker transmission.   

## 2021-08-13 ENCOUNTER — Ambulatory Visit (INDEPENDENT_AMBULATORY_CARE_PROVIDER_SITE_OTHER): Payer: Medicare Other

## 2021-08-13 ENCOUNTER — Ambulatory Visit: Payer: Medicare Other

## 2021-08-13 ENCOUNTER — Other Ambulatory Visit: Payer: Self-pay

## 2021-08-13 DIAGNOSIS — I10 Essential (primary) hypertension: Secondary | ICD-10-CM

## 2021-08-13 DIAGNOSIS — N1832 Chronic kidney disease, stage 3b: Secondary | ICD-10-CM

## 2021-08-13 DIAGNOSIS — E119 Type 2 diabetes mellitus without complications: Secondary | ICD-10-CM

## 2021-08-13 DIAGNOSIS — E1169 Type 2 diabetes mellitus with other specified complication: Secondary | ICD-10-CM

## 2021-08-13 DIAGNOSIS — Z794 Long term (current) use of insulin: Secondary | ICD-10-CM

## 2021-08-13 NOTE — Progress Notes (Signed)
Chronic Care Management Pharmacy Note  08/13/2021 Name:  Michael Snyder MRN:  [id number] DOB:  04/09/1951  Summary: -Has switched from humalog mix to humalog before each meal - reports BG remains elevated averaging around 220 (has follow up with endo at the end of the week) -Checking BP at home, reports reasonable control, averaging 130's/80's -Potassium dose decreased to 10 mEq daily per nephrology (started about 2 weeks ago) -Confirms adherence to all current medications  Recommendations/Changes made from today's visit: -Recommended for patient to have updated COVID booster, also patient to have shingrix vaccine series - agreeable to complete with next visit to pharmacy  -Patient to continue current medications, follow up with endo in 2 days regarding adjustments to medications for improved BG control  -Patient to continue monitoring BP 2-3 times weekly, make clinic aware should BP average >140/90   Subjective: Michael Snyder is an 71 y.o. year old male who is a primary patient of Hoyt Koch, MD.  The CCM team was consulted for assistance with disease management and care coordination needs.    Engaged with patient by telephone for follow up visit in response to provider referral for pharmacy case management and/or care coordination services.   Consent to Services:  The patient was given information about Chronic Care Management services, agreed to services, and gave verbal consent prior to initiation of services.  Please see initial visit note for detailed documentation.   Patient Care Team: Hoyt Koch, MD as PCP - General (Internal Medicine) Deboraha Sprang, MD as PCP - Electrophysiology (Cardiology) Delice Bison Darnelle Maffucci, [facility] (Pharmacist)  Patient lives at home alone. 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  Recent office visits: None since last  visit     Recent consult visits: 07/15/2021 - Dr. Loanne Drilling - Endo - humalog - 4 units AM, 4 units lunch, 5 units PM - follow up in 1 month  06/18/2021 - Tommye Standard PA-C - Cardiology - continue current medications - f/u in 1 year  06/03/2021 - Dr. Loanne Drilling - start humalog 5 units twice daily   Hospital visits: None in previous 6 months 07/14/20 ED visit - [place] for constipation. Disimpaction at bedside. Rec'd Miralax and hydration   Objective:  Lab Results  Component Value Date   CREATININE 1.73 (H) 04/01/2021   BUN 18 04/01/2021   GFR 38.42 (L) 03/13/2021   GFRNONAA 45 (L) 07/14/2020   GFRAA 54 (L) 11/29/2018   NA 139 04/01/2021   K 4.3 04/01/2021   CALCIUM 9.1 04/01/2021   CO2 18 (L) 04/01/2021   GLUCOSE 326 (H) 04/01/2021    Lab Results  Component Value Date/Time   HGBA1C 11.3 (A) 07/15/2021 08:24 AM   HGBA1C 11.5 (A) 04/30/2021 07:52 AM   HGBA1C 12.5 (H) 03/13/2021 10:21 AM   HGBA1C 10.6 (H) 04/20/2019 08:55 AM   FRUCTOSAMINE 569 (H) 08/02/2020 08:15 AM   FRUCTOSAMINE 442 (H) 02/01/2020 08:14 AM   GFR 38.42 (L) 03/13/2021 10:21 AM   GFR 42.35 (L) 06/17/2020 03:40 PM   MICROALBUR 2.1 (H) 03/13/2021 10:21 AM   MICROALBUR 0.9 06/17/2020 03:40 PM    Last diabetic Eye exam:  Lab Results  Component Value Date/Time   HMDIABEYEEXA No Retinopathy 06/07/2017 12:00 AM    Last diabetic Foot exam: No results found for: HMDIABFOOTEX   Lab Results  Component Value Date   CHOL  119 03/13/2021   HDL 41.30 03/13/2021   LDLCALC 57 03/13/2021   LDLDIRECT 102.0 04/20/2019   TRIG 107.0 03/13/2021   CHOLHDL 3 03/13/2021    Hepatic Function Latest Ref Rng & Units 03/13/2021 07/14/2020 06/17/2020  Total Protein 6.0 - 8.3 g/dL 6.7 7.3 6.9  Albumin 3.5 - 5.2 g/dL 4.0 4.7 4.2  AST 0 - 37 U/L 34 39 26  ALT 0 - 53 U/L $Remo'29 30 19  'ZMTyT$ Alk Phosphatase 39 - 117 U/L 77 72 78  Total Bilirubin 0.2 - 1.2 mg/dL 0.8 1.1 0.7  Bilirubin, Direct 0.01 - 0.4 - - -    Lab Results   Component Value Date/Time   TSH 4.65 (H) 08/02/2020 08:11 AM   TSH 2.36 06/10/2017 03:09 PM    CBC Latest Ref Rng & Units 03/13/2021 07/14/2020 06/17/2020  WBC 4.0 - 10.5 K/uL 6.0 9.7 6.7  Hemoglobin 13.0 - 17.0 g/dL 12.6(L) 13.5 12.7(L)  Hematocrit 39.0 - 52.0 % 38.5(L) 39.8 38.4(L)  Platelets 150.0 - 400.0 K/uL 147.0(L) 171 135.0(L)    No results found for: VD25OH  Clinical ASCVD: No  The ASCVD Risk score (Arnett DK, et al., 2019) failed to calculate for the following reasons:   The valid total cholesterol range is 130 to 320 mg/dL    Depression screen [facility] 2/9 03/13/2021 08/19/2020 02/20/2020  Decreased Interest 0 0 0  Down, Depressed, Hopeless 0 0 0  PHQ - 2 Score 0 0 0  Some recent data might be hidden    CHADS2VASc score: 4  Social History   Tobacco Use  Smoking Status Never  Smokeless Tobacco Never   BP Readings from Last 3 Encounters:  07/15/21 (!) 154/100  06/18/21 118/70  06/03/21 (!) 154/90   Pulse Readings from Last 3 Encounters:  07/15/21 85  06/18/21 (!) 46  06/03/21 (!) 112   Wt Readings from Last 3 Encounters:  07/15/21 167 lb 6.4 oz (75.9 kg)  06/18/21 168 lb 6.4 oz (76.4 kg)  06/03/21 165 lb 12.8 oz (75.2 kg)   BMI Readings from Last 3 Encounters:  07/15/21 22.70 kg/m  06/18/21 22.84 kg/m  06/03/21 22.49 kg/m    Assessment/Interventions: Review of patient past medical history, allergies, medications, health status, including review of consultants reports, laboratory and other test data, was performed as part of comprehensive evaluation and provision of chronic care management services.   SDOH:  (Social Determinants of Health) assessments and interventions performed: Yes  SDOH Screenings   Alcohol Screen: Not on file  Depression (PHQ2-9): Low Risk    PHQ-2 Score: 0  Financial Resource Strain: Not on file  Food Insecurity: Not on file  Housing: Not on file  Physical Activity: Not on file  Social Connections: Not on file  Stress: Not on  file  Tobacco Use: Low Risk    Smoking Tobacco Use: Never   Smokeless Tobacco Use: Never   Passive Exposure: Not on file  Transportation Needs: Not on file    [place]  Allergies  Allergen Reactions   Clindamycin/Lincomycin Swelling    Possible facial and lip swelling   Ace Inhibitors     Facial and lip swelling possibly associated    Medications Reviewed Today     Reviewed by Tomasa Blase, [facility] (Pharmacist) on 08/13/21 at [place] List Status: <None>   Medication Order Taking? Sig Documenting Provider Last Dose Status Informant  aspirin EC 81 MG tablet [id number] Yes Take 81 mg daily by mouth. [provider]  Taking Active   atorvastatin (LIPITOR) 20 MG tablet [id number] Yes Take 1 tablet (20 mg total) by mouth daily. Hoyt Koch, MD Taking Active   Blood Pressure Monitoring (BLOOD PRESSURE KIT) DEVI [id number] Yes 1 kit by Does not apply route daily. Hoyt Koch, MD Taking Active   brimonidine [facility]) 0.2 % ophthalmic solution [id number] Yes SMARTSIG:In Eye(s) [provider] Taking Active   Calcium Carbonate-Vitamin D (CALTRATE 600+D) 600-400 MG-UNIT per tablet [id number] Yes Take 1 tablet by mouth 2 (two) times daily. Norins, Heinz Knuckles, MD Taking Active   Cholecalciferol (VITAMIN D) 50 MCG (2000 UT) tablet [id number] Yes Take 2,000 Units by mouth daily. [provider] Taking Active   Continuous Blood Gluc Receiver (Loop) DEVI [id number] Yes Use as directed to check blood sugar. Renato Shin, MD Taking Active   Continuous Blood Gluc Sensor (DEXCOM G6 SENSOR) MISC [id number] Yes Change every 10 days Renato Shin, MD Taking Active   Continuous Blood Gluc Transmit (DEXCOM G6 TRANSMITTER) MISC [id number] Yes Change every 3 months Renato Shin, MD Taking Active   dapagliflozin propanediol (FARXIGA) 10 MG TABS tablet [id number] Yes Take 10 mg by mouth daily. Claudia Desanctis, MD Taking Active   hydrochlorothiazide  (MICROZIDE) 12.5 MG capsule [id number] Yes Take 1 capsule (12.5 mg total) by mouth daily. Baldwin Jamaica, PA-C Taking Active   insulin lispro (HUMALOG KWIKPEN) 100 UNIT/ML KwikPen [id number] Yes 3 times a day (just before each meal) 4-4-5 units, and pen needles 3/day. Renato Shin, MD Taking Active   Insulin Pen Needle (PEN NEEDLES) 32G X 4 MM MISC [id number] Yes Use as Directed Renato Shin, MD Taking Active   Lancets (ONETOUCH DELICA PLUS [id number]) [place] [id number] Yes CHECK BLOOD SUGAR TWICE  DAILY Hoyt Koch, MD Taking Active   latanoprost (XALATAN) 0.005 % ophthalmic solution [id number] Yes SMARTSIG:In Eye(s) [provider] Taking Active   levothyroxine (SYNTHROID) 25 MCG tablet [id number] Yes TAKE 1 TABLET BY MOUTH  DAILY BEFORE Paticia Stack, MD Taking Active   LINZESS 145 MCG CAPS capsule [id number] Yes TAKE 1 CAPSULE BY MOUTH  DAILY BEFORE BREAKFAST Hoyt Koch, MD Taking Active   metoprolol succinate (TOPROL XL) 25 MG 24 hr tablet [id number] Yes Take 1 tablet (25 mg total) by mouth daily. Shirley Friar, PA-C Taking Active   Multiple Vitamin (MULTIVITAMIN WITH MINERALS) TABS tablet [id number] Yes Take 1 tablet by mouth daily. [provider] Taking Active Self  Omega-3 Fatty Acids (FISH OIL) 300 MG CAPS [id number] Yes Take 1 capsule (300 mg total) by mouth daily. Norins, Heinz Knuckles, MD Taking Active Self  Roma Schanz test strip [id number] Yes TEST TWICE DAILY Hoyt Koch, MD Taking Active   pioglitazone (ACTOS) 30 MG tablet [id number] Yes TAKE 1 TABLET BY MOUTH  DAILY Renato Shin, MD Taking Active   polyethylene glycol powder (GLYCOLAX/MIRALAX) 17 GM/SCOOP powder [id number] Yes Take 17 g by mouth 2 (two) times daily as needed. Carlisle Cater, PA-C Taking Active   potassium chloride 20 MEQ TBCR [id number] Yes Take 20 mEq by mouth 2 (two) times daily.  Patient taking differently: Take 10 mEq by mouth daily.   Annamaria Helling Taking Active   RYBELSUS 14 MG TABS [id number] Yes TAKE 1 TABLET BY MOUTH  DAILY Renato Shin, MD Taking Active   triamcinolone ointment (KENALOG) 0.5 % [id number] Yes APPLY TO AFFECTED AREA(S)  TOPICALLY TWICE DAILY Hoyt Koch, MD Taking Active  Patient Active Problem List   Diagnosis Date Noted   Hypothyroidism 08/03/2020   Constipation 07/18/2020   De Quervain's tenosynovitis, left 03/14/2019   Left wrist pain 09/23/2018   Hepatitis C virus infection cured after antiviral drug therapy 12/24/2015   Cataract, right eye 11/13/2014   Cataract, left eye 57/07/7791   Complications, pacemaker cardiac, 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/2014   Atrial fibrillation ([place]) 11/17/2012   Cardiac pacemaker MDT 11/26/2011   Routine health maintenance 06/16/2011   Essential hypertension 06/01/2008   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/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   SICK SINUS SYNDROME 03/26/2007   SEIZURE DISORDER 03/26/2007    Immunization History  Administered Date(s) Administered   Fluad Quad(high Dose 65+) 04/20/2019, 07/29/2020, 03/13/2021   Influenza Whole 03/30/2008, 04/16/2009, 06/05/2010   Influenza, High Dose Seasonal PF 06/02/2016, 04/05/2017, 03/29/2018   Influenza,inj,Quad PF,6+ Mos 05/16/2013, 03/08/2014, 08/21/2015   PFIZER(Purple Top)SARS-COV-2 Vaccination 10/27/2019, 11/17/2019, 05/28/2020, 10/24/2020   Pneumococcal Conjugate-13 11/01/2014   Pneumococcal Polysaccharide-23 09/13/2012, 12/30/2017   Td 03/30/2008   Tdap 05/02/2018   Zoster Recombinat (Shingrix) 12/30/2017   Zoster, Live 07/20/2014    Conditions to be addressed/monitored:  Hypertension, Hyperlipidemia, Diabetes, Atrial Fibrillation, and Chronic Kidney Disease  Care Plan : [place]  Updates made by Tomasa Blase, RPH since 08/13/2021 12:00 AM     Problem: Hypertension, Hyperlipidemia, Diabetes, Atrial Fibrillation, and Chronic Kidney Disease   Priority: High      Long-Range Goal: Disease management   Start Date: 12/26/2020  Expected End Date: 08/13/2022  This Visit's Progress: On track  Recent Progress: On track  Priority: High  Note:   Current Barriers:  Unable to independently monitor therapeutic efficacy Unable to maintain control of diabetes  Pharmacist Clinical Goal(s):  Patient will achieve adherence to monitoring guidelines and medication adherence to achieve therapeutic efficacy adhere to plan to optimize therapeutic regimen for diabetes as evidenced by report of adherence to recommended medication management changes through collaboration with PharmD and provider.   Interventions: 1:1 collaboration with Hoyt Koch, MD regarding development and update of comprehensive plan of care as evidenced by provider attestation and co-signature Inter-disciplinary care team collaboration (see longitudinal plan of care) Comprehensive medication review performed; medication list updated in electronic medical record   AFIB / Hypertension / CKD  Controlled  Hx sick sinus syndrome s/p PPM  BP goal is:  <130/80 Patient checks BP at home daily Patient home BP readings are ranging: 130's/80's BP Readings from Last 3 Encounters:  07/15/21 (!) 154/100  06/18/21 118/70  06/03/21 (!) 154/90    Patient has tried/failed these meds in past:  triamterene/hctz, amlodipine Patient is currently controlled on the following medications: Metoprolol succinate 25 mg daily ([place], PA) HCTZ 12.$RemoveBef'5mg'cMImqCQWif$  daily  Potassium ER 10 mEq daily  (Dr Royce Macadamia) Aspirin 81 mg daily   We discussed: pt reports compliance with medications above; states that BP at home is well controlled denies any issues with current medications    Plan:  Recommend to continue current medication  Diabetes  Uncontrolled  A1c goal <8% Lab Results  Component Value Date   HGBA1C 11.3 (A) 07/15/2021  Checking BG: via Dexcom G6 Managed per Dr Loanne Drilling   Patient has failed these meds in  past: glipizide Patient is currently uncontrolled on the following medications: Humalog 4 units in AM, 4 units at lunch, 5 units in PM Rybelsus 14 mg daily Pioglitazone 30 mg daily Farxiga 10 mg daily (Dr Royce Macadamia) Dexcom G6    We discussed: BG averaging  220 since latest insulin adjustment, patient has follow up with endo team in 2 days, will discuss plan at that appointment    Plan: Recommend to continue current medication; continue CGM  Hyperlipidemia  Controlled  LDL goal < 100 Lab Results  Component Value Date   LDLCALC 57 03/13/2021  Patient has failed these meds in past: n/a Patient is currently controlled on the following medications: Atorvastatin 20 mg daily Omega-3 Fish oil 300 mg daily   We discussed: LDL is at goal; pt endorses compliance and denies issues   Plan: Continue current medications  Patient Goals/Self-Care Activities Patient will:  - take medications as prescribed -focus on medication adherence  -check glucose continuously via Dexcom G6 -check blood pressure 2-3 times a week     Medication Assistance: None required.  Patient affirms current coverage meets needs.  Care Gaps: Eye exam  Shingrix COVID booster   Patient's preferred pharmacy is:  [facility] ([place]) - [place], [place] [facility] [place] [address] [address] Phone: [phone] Fax: [phone]  [facility] [place] [place] - [address] [place] ([place]) [place] [address] Phone: [phone] Fax: [phone]  [place] [place] [address] Phone: [phone] Fax: [phone]  [facility] ([facility] ) - [place], [place] [address] Phone: [phone] Fax: [phone]   Uses pill box? Yes Pt endorses 100% compliance  We discussed: Current pharmacy is  preferred with insurance plan and patient is satisfied with pharmacy services Patient decided to: Continue current medication management strategy  Care Plan and Follow Up Patient Decision:  Patient agrees to Care Plan and Follow-up.  Plan: Telephone follow up appointment with care management team member scheduled for:  3 months  Tomasa Blase, PharmD Clinical Pharmacist, [place]

## 2021-08-13 NOTE — Patient Instructions (Signed)
Visit Information  Following are the goals we discussed today:   Manage My Medicine   Timeframe:  Long-Range Goal Priority:  High Start Date:    12/26/20                         Expected End Date:    05/15/22                Follow Up Date: May 2023   - call for medicine refill 2 or 3 days before it runs out - call if I am sick and can't take my medicine - keep a list of all the medicines I take; vitamins and herbals too - use a pillbox to sort medicine     Why is this important?   These steps will help you keep on track with your medicines.  Plan: Telephone follow up appointment with care management team member scheduled for:  3 months  The patient has been provided with contact information for the care management team and has been advised to call with any health related questions or concerns.   Ellin Saba, PharmD Clinical Pharmacist, [place]    Please call the care guide team at [phone] if you need to cancel or reschedule your appointment.   Patient verbalizes understanding of instructions and care plan provided today and agrees to view in MyChart. Active MyChart status confirmed with patient.

## 2021-08-13 NOTE — Progress Notes (Deleted)
Chronic Care Management Pharmacy Note  08/13/2021 Name:  Michael Snyder MRN:  [id number] DOB:  22-Apr-1951  Summary: -Pt was approved for Dexcom G6 and has been wearing it for about a month; he endorses compliance with insulin change from Lantus to Humalog Mix per endocrine; he endorses sugars have improved since starting exercise  Recommendations/Changes made from today's visit: -No med changes   Subjective: Michael Snyder is an 71 y.o. year old male who is a primary patient of Hoyt Koch, MD.  The CCM team was consulted for assistance with disease management and care coordination needs.    Engaged with patient by telephone for follow up visit in response to provider referral for pharmacy case management and/or care coordination services.   Consent to Services:  The patient was given information about Chronic Care Management services, agreed to services, and gave verbal consent prior to initiation of services.  Please see initial visit note for detailed documentation.   Patient Care Team: Hoyt Koch, MD as PCP - General (Internal Medicine) Deboraha Sprang, MD as PCP - Electrophysiology (Cardiology) Charlton Haws, [facility] as Pharmacist (Pharmacist)  Patient lives at home alone. 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  Recent office visits: 03/13/21 Dr Sharlet Salina OV: Physical - A1c 12.5, advised to see endo.  07/29/20 Dr Sharlet Salina OV: acute visit for recent food poisoning, mproving. Advised Tums/Rolaids.  07/18/20 Dr Sharlet Salina OV: acute visit for constipation. Rx'd Linzess.  06/17/20 Dr Sharlet Salina OV: chronic f/u. Pt reports urologist told him to stop amlodipine and actos- per PCP's review of note no evidence of this. Advised to stop glipizide when starting Lantus (per endocrine). K low, switched HCTZ to triamterene/HCTZ and advised to stop potassium.  Recent consult  visits: 07/15/2021 - Dr. Loanne Drilling - Endo - humalog - 4 units AM, 4 units lunch, 5 units PM - follow up in 1 month  06/18/2021 - Tommye Standard PA-C - Cardiology - continue current medications - f/u in 1 year  06/03/2021 - Dr. Loanne Drilling - start humalog 5 units twice daily   Hospital visits: None in previous 6 months 07/14/20 ED visit - [place] for constipation. Disimpaction at bedside. Rec'd Miralax and hydration   Objective:  Lab Results  Component Value Date   CREATININE 1.73 (H) 04/01/2021   BUN 18 04/01/2021   GFR 38.42 (L) 03/13/2021   GFRNONAA 45 (L) 07/14/2020   GFRAA 54 (L) 11/29/2018   NA 139 04/01/2021   K 4.3 04/01/2021   CALCIUM 9.1 04/01/2021   CO2 18 (L) 04/01/2021   GLUCOSE 326 (H) 04/01/2021    Lab Results  Component Value Date/Time   HGBA1C 11.3 (A) 07/15/2021 08:24 AM   HGBA1C 11.5 (A) 04/30/2021 07:52 AM   HGBA1C 12.5 (H) 03/13/2021 10:21 AM   HGBA1C 10.6 (H) 04/20/2019 08:55 AM   FRUCTOSAMINE 569 (H) 08/02/2020 08:15 AM   FRUCTOSAMINE 442 (H) 02/01/2020 08:14 AM   GFR 38.42 (L) 03/13/2021 10:21 AM   GFR 42.35 (L) 06/17/2020 03:40 PM   MICROALBUR 2.1 (H) 03/13/2021 10:21 AM   MICROALBUR 0.9 06/17/2020 03:40 PM    Last diabetic Eye exam:  Lab Results  Component Value Date/Time   HMDIABEYEEXA No Retinopathy 06/07/2017 12:00 AM    Last diabetic Foot exam: No results found for: HMDIABFOOTEX   Lab Results  Component Value Date   CHOL 119 03/13/2021  HDL 41.30 03/13/2021   LDLCALC 57 03/13/2021   LDLDIRECT 102.0 04/20/2019   TRIG 107.0 03/13/2021   CHOLHDL 3 03/13/2021    Hepatic Function Latest Ref Rng & Units 03/13/2021 07/14/2020 06/17/2020  Total Protein 6.0 - 8.3 g/dL 6.7 7.3 6.9  Albumin 3.5 - 5.2 g/dL 4.0 4.7 4.2  AST 0 - 37 U/L 34 39 26  ALT 0 - 53 U/L _0 Alk Phosphatase 39 - 117 U/L 77 72 78  Total Bilirubin 0.2 - 1.2 mg/dL 0.8 1.1 0.7  Bilirubin, Direct 0.01 - 0.4 - - -    Lab Results  Component Value Date/Time   TSH  4.65 (H) 08/02/2020 08:11 AM   TSH 2.36 06/10/2017 03:09 PM    CBC Latest Ref Rng & Units 03/13/2021 07/14/2020 06/17/2020  WBC 4.0 - 10.5 K/uL 6.0 9.7 6.7  Hemoglobin 13.0 - 17.0 g/dL 12.6(L) 13.5 12.7(L)  Hematocrit 39.0 - 52.0 % 38.5(L) 39.8 38.4(L)  Platelets 150.0 - 400.0 K/uL 147.0(L) 171 135.0(L)    No results found for: VD25OH  Clinical ASCVD: No  The ASCVD Risk score (Arnett DK, et al., 2019) failed to calculate for the following reasons:   The valid total cholesterol range is 130 to 320 mg/dL    Depression screen [facility] 2/9 03/13/2021 08/19/2020 02/20/2020  Decreased Interest 0 0 0  Down, Depressed, Hopeless 0 0 0  PHQ - 2 Score 0 0 0  Some recent data might be hidden    CHADS2VASc score: 4  Social History   Tobacco Use  Smoking Status Never  Smokeless Tobacco Never   BP Readings from Last 3 Encounters:  07/15/21 (!) 154/100  06/18/21 118/70  06/03/21 (!) 154/90   Pulse Readings from Last 3 Encounters:  07/15/21 85  06/18/21 (!) 46  06/03/21 (!) 112   Wt Readings from Last 3 Encounters:  07/15/21 167 lb 6.4 oz (75.9 kg)  06/18/21 168 lb 6.4 oz (76.4 kg)  06/03/21 165 lb 12.8 oz (75.2 kg)   BMI Readings from Last 3 Encounters:  07/15/21 22.70 kg/m  06/18/21 22.84 kg/m  06/03/21 22.49 kg/m    Assessment/Interventions: Review of patient past medical history, allergies, medications, health status, including review of consultants reports, laboratory and other test data, was performed as part of comprehensive evaluation and provision of chronic care management services.   SDOH:  (Social Determinants of Health) assessments and interventions performed: Yes  SDOH Screenings   Alcohol Screen: Not on file  Depression (PHQ2-9): Low Risk    PHQ-2 Score: 0  Financial Resource Strain: Not on file  Food Insecurity: Not on file  Housing: Not on file  Physical Activity: Not on file  Social Connections: Not on file  Stress: Not on file  Tobacco Use: Low Risk     Smoking Tobacco Use: Never   Smokeless Tobacco Use: Never   Passive Exposure: Not on file  Transportation Needs: Not on file    [place]  Allergies  Allergen Reactions   Clindamycin/Lincomycin Swelling    Possible facial and lip swelling   Ace Inhibitors     Facial and lip swelling possibly associated    Medications Reviewed Today     Reviewed by Casandra Doffing, CMA (Certified Medical Assistant) on 07/15/21 at [place] List Status: <None>   Medication Order Taking? Sig Documenting Provider Last Dose Status Informant  aspirin EC 81 MG tablet [id number] Yes Take 81 mg daily by mouth. [provider] Taking Active  atorvastatin (LIPITOR) 20 MG tablet [id number] Yes Take 1 tablet (20 mg total) by mouth daily. Hoyt Koch, MD Taking Active   Blood Pressure Monitoring (BLOOD PRESSURE KIT) DEVI [id number] Yes 1 kit by Does not apply route daily. Hoyt Koch, MD Taking Active   brimonidine [facility] 0.2 % ophthalmic solution [id number] Yes SMARTSIG:In Eye(s) [provider] Taking Active   Calcium Carbonate-Vitamin D (CALTRATE 600+D) 600-400 MG-UNIT per tablet [id number] Yes Take 1 tablet by mouth 2 (two) times daily. Norins, Heinz Knuckles, MD Taking Active   Cholecalciferol (VITAMIN D) 50 MCG (2000 UT) tablet [id number] Yes Take 2,000 Units by mouth daily. [provider] Taking Active   Continuous Blood Gluc Receiver ([place]) DEVI [id number] Yes Use as directed to check blood sugar. Renato Shin, MD Taking Active   Continuous Blood Gluc Sensor (DEXCOM G6 SENSOR) MISC [id number] Yes Change every 10 days Renato Shin, MD Taking Active   Continuous Blood Gluc Sensor (FREESTYLE LIBRE 2 SENSOR) [place] [id number] Yes 1 Device by Does not apply route every 14 (fourteen) days. Renato Shin, MD Taking Active   Continuous Blood Gluc Transmit (DEXCOM G6 TRANSMITTER) MISC [id number] Yes Change every 3 months Renato Shin, MD Taking Active    dapagliflozin propanediol (FARXIGA) 10 MG TABS tablet [id number] Yes Take 10 mg by mouth daily. Claudia Desanctis, MD Taking Active   hydrochlorothiazide (MICROZIDE) 12.5 MG capsule [id number] Yes Take 1 capsule (12.5 mg total) by mouth daily. Baldwin [place], PA-C Taking Active   insulin lispro (HUMALOG KWIKPEN) 100 UNIT/ML KwikPen [id number] Yes Inject 5 Units into the skin 2 (two) times daily with a meal. Breakfast and supper; and pen needles 2/day Renato Shin, MD Taking Active   Insulin Pen Needle (PEN NEEDLES) 32G X 4 MM MISC [id number] Yes Use as Directed Renato Shin, MD Taking Active   Lancets (ONETOUCH DELICA PLUS [id number]) [place] [id number] Yes CHECK BLOOD SUGAR TWICE  DAILY Hoyt Koch, MD Taking Active   latanoprost (XALATAN) 0.005 % ophthalmic solution [id number] Yes SMARTSIG:In Eye(s) [provider] Taking Active   levothyroxine (SYNTHROID) 25 MCG tablet [id number] Yes TAKE 1 TABLET BY MOUTH  DAILY BEFORE Paticia Stack, MD Taking Active   LINZESS 145 MCG CAPS capsule [id number] Yes TAKE 1 CAPSULE BY MOUTH  DAILY BEFORE BREAKFAST Hoyt Koch, MD Taking Active   metoprolol succinate (TOPROL XL) 25 MG 24 hr tablet [id number] Yes Take 1 tablet (25 mg total) by mouth daily. Shirley Friar, PA-C Taking Active   Multiple Vitamin (MULTIVITAMIN WITH MINERALS) TABS tablet [id number] Yes Take 1 tablet by mouth daily. [provider] Taking Active Self  Omega-3 Fatty Acids (FISH OIL) 300 MG CAPS [id number] Yes Take 1 capsule (300 mg total) by mouth daily. Norins, Heinz Knuckles, MD Taking Active Self  Roma Schanz test strip [id number] Yes TEST TWICE DAILY Hoyt Koch, MD Taking Active   pioglitazone (ACTOS) 30 MG tablet [id number] Yes TAKE 1 TABLET BY MOUTH  DAILY Renato Shin, MD Taking Active   polyethylene glycol powder (GLYCOLAX/MIRALAX) 17 GM/SCOOP powder [id number] Yes Take 17 g by mouth 2 (two) times daily as needed. Carlisle Cater, PA-C  Taking Active   potassium chloride 20 MEQ TBCR [id number] Yes Take 20 mEq by mouth 2 (two) times daily. Annamaria Helling Taking Active   RYBELSUS 14 MG TABS [id number] Yes TAKE 1 TABLET BY MOUTH  DAILY Renato Shin, MD Taking Active   triamcinolone ointment (KENALOG) 0.5 % [id number] Yes APPLY TO  AFFECTED AREA(S)  TOPICALLY TWICE DAILY Hoyt Koch, MD Taking Active             Patient Active Problem List   Diagnosis Date Noted   Hypothyroidism 08/03/2020   Constipation 07/18/2020   De Quervain's tenosynovitis, left 03/14/2019   Left wrist pain 09/23/2018   Hepatitis C virus infection cured after antiviral drug therapy 12/24/2015   Cataract, right eye 11/13/2014   Cataract, left eye 96/29/5284   Complications, pacemaker cardiac, 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/2014   Atrial fibrillation ([place]) 11/17/2012   Cardiac pacemaker MDT 11/26/2011   Routine health maintenance 06/16/2011   Essential hypertension 06/01/2008   Diabetes mellitus type 2 with complications ([place]) 13/24/4010   SICK SINUS SYNDROME 03/26/2007   SEIZURE DISORDER 03/26/2007    Immunization History  Administered Date(s) Administered   Fluad Quad(high Dose 65+) 04/20/2019, 07/29/2020, 03/13/2021   Influenza Whole 03/30/2008, 04/16/2009, 06/05/2010   Influenza, High Dose Seasonal PF 06/02/2016, 04/05/2017, 03/29/2018   Influenza,inj,Quad PF,6+ Mos 05/16/2013, 03/08/2014, 08/21/2015   PFIZER(Purple Top)SARS-COV-2 Vaccination 10/27/2019, 11/17/2019, 05/28/2020, 10/24/2020   Pneumococcal Conjugate-13 11/01/2014   Pneumococcal Polysaccharide-23 09/13/2012, 12/30/2017   Td 03/30/2008   Tdap 05/02/2018   Zoster Recombinat (Shingrix) 12/30/2017   Zoster, Live 07/20/2014    Conditions to be addressed/monitored:  Hypertension, Hyperlipidemia, Diabetes, Atrial Fibrillation, and Chronic Kidney Disease  There are no care plans that you recently modified to display for this patient.   Medication Assistance: None  required.  Patient affirms current coverage meets needs.  Care Gaps: Eye exam  Shingrix COVID booster   Patient's preferred pharmacy is:  [facility] ([place]) - [place], [place] [facility] [place] [address] [address] Phone: [phone] Fax: [phone]  [place]), [place] - [address] [place] ([place]) [place] [address] Phone: [phone] Fax: [phone]  [place] [place] [address] Phone: [phone] Fax: [phone]  [facility] ([facility] ) - [place], [place] [address] Phone: [phone] Fax: [phone]   Uses pill box? Yes Pt endorses 100% compliance  We discussed: Current pharmacy is preferred with insurance plan and patient is satisfied with pharmacy services Patient decided to: Continue current medication management strategy  Care Plan and Follow Up Patient Decision:  Patient agrees to Care Plan and Follow-up.  Plan: Telephone follow up appointment with care management team member scheduled for:  3 months  ***

## 2021-08-15 ENCOUNTER — Other Ambulatory Visit: Payer: Self-pay

## 2021-08-15 ENCOUNTER — Ambulatory Visit: Payer: Medicare Other | Admitting: Endocrinology

## 2021-08-15 ENCOUNTER — Encounter: Payer: Self-pay | Admitting: Endocrinology

## 2021-08-15 VITALS — BP 132/56 | HR 76 | Ht 72.0 in | Wt 161.0 lb

## 2021-08-15 DIAGNOSIS — E118 Type 2 diabetes mellitus with unspecified complications: Secondary | ICD-10-CM | POA: Diagnosis not present

## 2021-08-15 MED ORDER — INSULIN LISPRO (1 UNIT DIAL) 100 UNIT/ML (KWIKPEN): PEN_INJECTOR | SUBCUTANEOUS | 3 refills | Status: DC

## 2021-08-15 NOTE — Progress Notes (Signed)
Subjective:    Patient ID: Michael Snyder, male    DOB: June 02, 1951, 71 y.o.   MRN: [id number]  HPI Pt returns for f/u of diabetes mellitus:  DM type: Insulin-requiring type 2 Dx'ed: 6834 Complications: stage 3 CRI.   Therapy: insulin since 2021, and 3 oral meds.   DKA: never.   Severe hypoglycemia: never.   Pancreatitis: never Pancreatic imaging: CT (2020): Fatty atrophy. Calcification of the ducts, and dilatation of the main duct.   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   Other: fructosamine converts to higher A1c than A1c itself and cbg's; pt meter cbg was similar to office meter; he eats meals at 7AM, 12N, and 8PM.    Interval history: pt states he feels well in general.  He takes insulin as rx'ed. We downloaded continuous glucose monitor data.  Glucose varies from 125-400.  It increases 2PM-12MN, then decreases until 9AM.  It is then flat until 3PM.  He seldom has hypoglycemia, and these episodes are mild.   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   Pacemaker    Medtronic Adapta ADDR01   Seizure disorder ([place])    Seizures ([place])    last time in 1990's    Past Surgical History:  Procedure Laterality Date   LACERATION REPAIR  1980   scalp   PACEMAKER LEAD REMOVAL N/A 03/23/2014   Procedure: PACEMAKER LEAD REMOVAL;  Surgeon: Evans Lance, MD;  Location: [facility];  Service: Cardiovascular;  Laterality: N/A;   PTDVP     7/08 medtronic DDD    Social History   Socioeconomic History   Marital status: Legally Separated    Spouse name: Not on file   Number of children: 3   Years of education: Not on file   Highest education level: Not on file  Occupational History   Occupation: Games developer, truck driver  Tobacco Use   Smoking status: Never   Smokeless tobacco: Never   Vaping Use   Vaping Use: Never used  Substance and Sexual Activity   Alcohol use: No    Comment: quit 20 yrs ago   Drug use: No   Sexual activity: Yes    Partners: Female  Other Topics Concern   Not on file  Social History Narrative   12th grade education. H/O substance abuse- now abstemious and works with other in recovery..Attends NA on a regular basis and serves as a sponsor. Married 18 years-divorced. Married 5 years 2nd marriage.3 daughters previous marriage, 9 grandchildren, 3 great-grands. Work- Merchant navy officer            Social Determinants of Radio broadcast assistant Strain: Not on [employer] Insecurity: Not on file  Transportation Needs: Not on file  Physical Activity: Not on file  Stress: Not on file  Social Connections: Not on file  Intimate Partner Violence: Not on file    Current Outpatient Medications on File Prior to Visit  Medication Sig Dispense Refill   aspirin EC 81 MG tablet Take 81 mg daily by mouth.     atorvastatin (LIPITOR) 20 MG tablet Take 1 tablet (20 mg total) by mouth daily. 90 tablet 3   Blood Pressure Monitoring (  BLOOD PRESSURE KIT) DEVI 1 kit by Does not apply route daily. 1 each 0   brimonidine (ALPHAGAN) 0.2 % ophthalmic solution SMARTSIG:In Eye(s)     Calcium Carbonate-Vitamin D (CALTRATE 600+D) 600-400 MG-UNIT per tablet Take 1 tablet by mouth 2 (two) times daily. 180 tablet 3   Cholecalciferol (VITAMIN D) 50 MCG (2000 UT) tablet Take 2,000 Units by mouth daily.     Continuous Blood Gluc Receiver (DEXCOM G6 RECEIVER) DEVI Use as directed to check blood sugar. 1 each 0   Continuous Blood Gluc Sensor (DEXCOM G6 SENSOR) MISC Change every 10 days 9 each 3   Continuous Blood Gluc Transmit (DEXCOM G6 TRANSMITTER) MISC Change every 3 months 1 each 3   dapagliflozin propanediol (FARXIGA) 10 MG TABS tablet Take 10 mg by mouth daily.     hydrochlorothiazide (MICROZIDE) 12.5 MG capsule Take 1 capsule (12.5 mg total) by mouth daily. 90  capsule 3   Insulin Pen Needle (PEN NEEDLES) 32G X 4 MM MISC Use as Directed 100 each 5   Lancets (ONETOUCH DELICA PLUS [id number]) MISC CHECK BLOOD SUGAR TWICE  DAILY 200 each 3   latanoprost (XALATAN) 0.005 % ophthalmic solution SMARTSIG:In Eye(s)     levothyroxine (SYNTHROID) 25 MCG tablet TAKE 1 TABLET BY MOUTH  DAILY BEFORE BREAKFAST 90 tablet 3   LINZESS 145 MCG CAPS capsule TAKE 1 CAPSULE BY MOUTH  DAILY BEFORE BREAKFAST 90 capsule 3   metoprolol succinate (TOPROL XL) 25 MG 24 hr tablet Take 1 tablet (25 mg total) by mouth daily. 90 tablet 3   Multiple Vitamin (MULTIVITAMIN WITH MINERALS) TABS tablet Take 1 tablet by mouth daily.     Omega-3 Fatty Acids (FISH OIL) 300 MG CAPS Take 1 capsule (300 mg total) by mouth daily. 90 capsule 3   ONETOUCH VERIO test strip TEST TWICE DAILY 200 strip 3   pioglitazone (ACTOS) 30 MG tablet TAKE 1 TABLET BY MOUTH  DAILY 90 tablet 3   polyethylene glycol powder (GLYCOLAX/MIRALAX) 17 GM/SCOOP powder Take 17 g by mouth 2 (two) times daily as needed. 255 g 0   potassium chloride 20 MEQ TBCR Take 20 mEq by mouth 2 (two) times daily. (Patient taking differently: Take 10 mEq by mouth daily.) 180 tablet 3   RYBELSUS 14 MG TABS TAKE 1 TABLET BY MOUTH  DAILY 90 tablet 0   triamcinolone ointment (KENALOG) 0.5 % APPLY TO AFFECTED AREA(S)  TOPICALLY TWICE DAILY 90 g 0   No current facility-administered medications on file prior to visit.    Allergies  Allergen Reactions   Clindamycin/Lincomycin Swelling    Possible facial and lip swelling   Ace Inhibitors     Facial and lip swelling possibly associated    Family History  Problem Relation Age of Onset   Hypertension Mother    COPD Mother    Hypertension Brother    Cancer Brother        leukemia   Hypertension Brother    Diabetes Neg Hx     BP (!) 132/56    Pulse 76    Ht 6' (1.829 m)    Wt 161 lb (73 kg)    SpO2 99%    BMI 21.84 kg/m    Review of Systems     Objective:   Physical Exam        Assessment & Plan:  Insulin-requiring type 2 DM: uncontrolled  Patient Instructions  I have sent a prescription to your pharmacy, to increase the humalog to 3 times  a day (just before each meal) 4-5-8 units check your blood sugar twice a day.  vary the time of day when you check, between before the 3 meals, and at bedtime.  also check if you have symptoms of your blood sugar being too high or too low.  please keep a record of the readings and bring it to your next appointment here (or you can bring the meter itself).  You can write it on any piece of paper.  please call us sooner if your blood sugar goes below 70, or if you have a lot of readings over 200.   Please come back for a follow-up appointment in 1 month.

## 2021-08-15 NOTE — Patient Instructions (Addendum)
I have sent a prescription to your pharmacy, to increase the humalog to 3 times a day (just before each meal) 4-5-8 units check your blood sugar twice a day.  vary the time of day when you check, between before the 3 meals, and at bedtime.  also check if you have symptoms of your blood sugar being too high or too low.  please keep a record of the readings and bring it to your next appointment here (or you can bring the meter itself).  You can write it on any piece of paper.  please call us sooner if your blood sugar goes below 70, or if you have a lot of readings over 200.   Please come back for a follow-up appointment in 1 month.

## 2021-08-18 ENCOUNTER — Other Ambulatory Visit: Payer: Self-pay

## 2021-08-18 DIAGNOSIS — E119 Type 2 diabetes mellitus without complications: Secondary | ICD-10-CM

## 2021-08-18 MED ORDER — PEN NEEDLES 32G X 4 MM MISC: 5 refills | Status: DC

## 2021-08-19 DIAGNOSIS — Z961 Presence of intraocular lens: Secondary | ICD-10-CM | POA: Diagnosis not present

## 2021-08-19 DIAGNOSIS — H35373 Puckering of macula, bilateral: Secondary | ICD-10-CM | POA: Diagnosis not present

## 2021-08-19 DIAGNOSIS — H52223 Regular astigmatism, bilateral: Secondary | ICD-10-CM | POA: Diagnosis not present

## 2021-08-19 DIAGNOSIS — H401232 Low-tension glaucoma, bilateral, moderate stage: Secondary | ICD-10-CM | POA: Diagnosis not present

## 2021-08-19 DIAGNOSIS — H524 Presbyopia: Secondary | ICD-10-CM | POA: Diagnosis not present

## 2021-08-19 DIAGNOSIS — E119 Type 2 diabetes mellitus without complications: Secondary | ICD-10-CM | POA: Diagnosis not present

## 2021-08-19 DIAGNOSIS — H5203 Hypermetropia, bilateral: Secondary | ICD-10-CM | POA: Diagnosis not present

## 2021-08-19 LAB — HM DIABETES EYE EXAM

## 2021-09-01 ENCOUNTER — Other Ambulatory Visit: Payer: Self-pay | Admitting: Endocrinology

## 2021-09-02 DIAGNOSIS — E119 Type 2 diabetes mellitus without complications: Secondary | ICD-10-CM | POA: Diagnosis not present

## 2021-09-02 DIAGNOSIS — E785 Hyperlipidemia, unspecified: Secondary | ICD-10-CM

## 2021-09-02 DIAGNOSIS — I10 Essential (primary) hypertension: Secondary | ICD-10-CM | POA: Diagnosis not present

## 2021-09-02 DIAGNOSIS — E1169 Type 2 diabetes mellitus with other specified complication: Secondary | ICD-10-CM | POA: Diagnosis not present

## 2021-09-02 DIAGNOSIS — Z794 Long term (current) use of insulin: Secondary | ICD-10-CM | POA: Diagnosis not present

## 2021-09-04 ENCOUNTER — Other Ambulatory Visit: Payer: Self-pay

## 2021-09-04 ENCOUNTER — Telehealth: Payer: Self-pay | Admitting: Endocrinology

## 2021-09-04 ENCOUNTER — Encounter: Payer: Self-pay | Admitting: Dietician

## 2021-09-04 ENCOUNTER — Encounter: Payer: Medicare Other | Attending: Endocrinology | Admitting: Dietician

## 2021-09-04 DIAGNOSIS — E118 Type 2 diabetes mellitus with unspecified complications: Secondary | ICD-10-CM | POA: Insufficient documentation

## 2021-09-04 NOTE — Telephone Encounter (Signed)
Please increase humalog to 3 times a day (just before each meal) 4-7-9 units.   ?

## 2021-09-04 NOTE — Progress Notes (Signed)
Diabetes Self-Management Education  Visit Type: Follow-up  Appt. Start Time: 0910 Appt. End Time: 1015  09/05/2021  [name], identified by name and date of birth, is a 71 y.o. male with a diagnosis of Diabetes:  .   ASSESSMENT Patient is here today alone.  He was last seen by this RD on 04/15/2021 for Dexcom training and for nutrition education 08/19/2020. His A1C remains elevated and goal is to continue to decrease this. Complains of insulin burning.  Discussed pinching up as well as insulin injection site rotation. Walking 30 minutes per day Glucose 388 at this visit per sensor reading and Dexcom report reviewed.  He is in range only 9% of the time and 76% very high.  Fasting within range at times.  Instructed on proper use of Rybelsus.  Verbal orders obtained from MD to increase his Humalog to 4 units before breakfast, 7 units before lunch, and 9 units before dinner.  History includes Type 2 Diabetes, HTN, HLD, CKD3, hypothyroidism Labs noted to include A1C 11.3% 07/15/2021 decreased from 12.5% 03/13/2021.  eGFR 42, BUN 18, Creatinine 1.73, Potassium 4.3 03/12/2021. Medications include:   Humalog 4 units before breakfast, 5 units before lunch, 8 units before dinner; Farxiga, Actos, Rybelsus (taking incorrectly), Vitamin D, Omega 3, Calcium, MVI Sleep good. Dexcom G6   Weight hx: 160 lbs 09/04/2021 161 lbs 08/19/2020 UBW 179-190 lbs   Patient lives with his brother.  He does much of the shopping and cooking.  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  States that he gets a lot of exercise with his jobs.  He enjoys gardening in the summer.   Diabetes Self-Management Education - 09/05/21 0941       Visit Information   Visit Type Follow-up      Psychosocial Assessment   Self-care barriers None    Self-management support Doctor's office    Other persons present Patient    Patient Concerns Nutrition/Meal planning;Healthy  Lifestyle;Glycemic Control    Special Needs None    Learning Readiness Ready      Pre-Education Assessment   Patient understands the diabetes disease and treatment process. Demonstrates understanding / competency    Patient understands incorporating nutritional management into lifestyle. Needs Review    Patient undertands incorporating physical activity into lifestyle. Demonstrates understanding / competency    Patient understands using medications safely. Needs Review    Patient understands monitoring blood glucose, interpreting and using results Needs Review    Patient understands prevention, detection, and treatment of acute complications. Demonstrates understanding / competency    Patient understands prevention, detection, and treatment of chronic complications. Demonstrates understanding / competency    Patient understands how to develop strategies to address psychosocial issues. Demonstrates understanding / competency    Patient understands how to develop strategies to promote health/change behavior. Needs Review      Complications   Last HgB A1C per patient/outside source 11.3 %   07/15/2021 decreased from 12.5% 03/13/2021   How often do you check your blood sugar? 0 times/day (not testing)    Fasting Blood glucose range (mg/dL) 062-067    Postprandial Blood glucose range (mg/dL) >940    Number of hypoglycemic episodes per month 0    Number of hyperglycemic episodes per week 21    Can you tell when your blood sugar is high? No      Dietary Intake   Breakfast Pancakes with sugar free syrup,  2 strips bacon    Snack (morning) PB crackers    Lunch salad with [place]    Snack (afternoon) PB crackers    Dinner blackened chicken salad with lite ranch    Snack (evening) PB crackers    Beverage(s) water, Glucerna, occasional diet soda, tea with splenda      Exercise   Exercise Type Light (walking / raking leaves)    How many days per week to you exercise? 7    How many minutes per day  do you exercise? 30    Total minutes per week of exercise 210      Patient Education   Previous Diabetes Education Yes (please comment)   2022   Nutrition management  Role of diet in the treatment of diabetes and the relationship between the three main macronutrients and blood glucose level;Food label reading, portion sizes and measuring food.;Meal options for control of blood glucose level and chronic complications.    Medications Reviewed patients medication for diabetes, action, purpose, timing of dose and side effects.;Other (comment)   taught rybelsus   Monitoring Taught/discussed recording of test results and interpretation of SMBG.;Identified appropriate SMBG and/or A1C goals.      Individualized Goals (developed by patient)   Nutrition General guidelines for healthy choices and portions discussed    Physical Activity Exercise 5-7 days per week;30 minutes per day    Medications take my medication as prescribed    Monitoring  test my blood glucose as discussed    Reducing Risk examine blood glucose patterns;treat hypoglycemia with 15 grams of carbs if blood glucose less than $RemoveB'70mg'pPXGjdiL$ /dL;increase portions of healthy fats      Patient Self-Evaluation of Goals - Patient rates self as meeting previously set goals (% of time)   Nutrition 50 - 75 %    Physical Activity >75%    Medications >75%    Monitoring >75%    Problem Solving 50 - 75 %    Reducing Risk 50 - 75 %    Health Coping >75%      Post-Education Assessment   Patient understands the diabetes disease and treatment process. Demonstrates understanding / competency    Patient understands incorporating nutritional management into lifestyle. Needs Review    Patient undertands incorporating physical activity into lifestyle. Demonstrates understanding / competency    Patient understands using medications safely. Demonstrates understanding / competency    Patient understands monitoring blood glucose, interpreting and using results  Demonstrates understanding / competency    Patient understands prevention, detection, and treatment of acute complications. Demonstrates understanding / competency    Patient understands prevention, detection, and treatment of chronic complications. Demonstrates understanding / competency    Patient understands how to develop strategies to address psychosocial issues. Demonstrates understanding / competency    Patient understands how to develop strategies to promote health/change behavior. Needs Review      Outcomes   Expected Outcomes Demonstrated interest in learning. Expect positive outcomes    Future DMSE 2 months    Program Status Not Completed      Subsequent Visit   Since your last visit have you continued or begun to take your medications as prescribed? Yes    Since your last visit have you experienced any weight changes? No change    Since your last visit, are you checking your blood glucose at least once a day? Yes             Individualized Plan for Diabetes Self-Management Training:   Learning  Objective:  Patient will have a greater understanding of diabetes self-management. Patient education plan is to attend individual and/or group sessions per assessed needs and concerns.   Plan:   Patient Instructions  Rotate your insulin injection sites. Take Rybelsus with no more than 4 oz of water first thing in the morning.  Take no other pills or anything with this.  Eat or drink after 30 minutes. Consider adding lemon juice to your lite ranch to thin the dressing  Aim to have your carbohydrates (about 45 grams per meal) with your meals rather than as snacks as you are taking insulin to cover the carbs with the meals.  If hungry between meals, choose a protein choice and limit to 15 grams of carbs.  Please increase your Humalog for Lunch and Dinner  Breakfast continue 4 units Humalog  Lunch increase to 7 units  Dinner increase to 9 units   Expected Outcomes:   Demonstrated interest in learning. Expect positive outcomes  Education material provided: Meal plan card, My Plate, and Snack sheet  If problems or questions, patient to contact team via:  Phone  Future DSME appointment: 2 months

## 2021-09-04 NOTE — Patient Instructions (Addendum)
Rotate your insulin injection sites. ?Take Rybelsus with no more than 4 oz of water first thing in the morning.  Take no other pills or anything with this.  Eat or drink after 30 minutes. ?Consider adding lemon juice to your lite ranch to thin the dressing ? ?Aim to have your carbohydrates (about 45 grams per meal) with your meals rather than as snacks as you are taking insulin to cover the carbs with the meals. ? ?If hungry between meals, choose a protein choice and limit to 15 grams of carbs. ? ?Please increase your Humalog for Lunch and Dinner ? Breakfast continue 4 units Humalog ? Lunch increase to 7 units ? Dinner increase to 9 units  ?

## 2021-09-09 ENCOUNTER — Telehealth: Payer: Self-pay

## 2021-09-09 NOTE — Progress Notes (Signed)
? ? ?Chronic Care Management ?Pharmacy Assistant  ? ?Name: Michael Snyder  MRN: [id number] DOB: 1951/03/15 ? ?Michael Snyder is an 71 y.o. year old male who presents for his follow-up CCM visit with the clinical pharmacist. ? ?Reason for Encounter: Disease State ?  ?Conditions to be addressed/monitored: ?DMII ? ? ?Recent office visits:  ?None ID ? ?Recent consult visits:  ?08/15/21 Renato Shin, MD-Endocrinology (Diabetes mellitus) Medication changes: increase the humalog to 3 times a day (just before each meal) 4-5-8 units ? ?Hospital visits:  ?None in previous 6 months ? ?Medications: ?Outpatient Encounter Medications as of 09/09/2021  ?Medication Sig  ? aspirin EC 81 MG tablet Take 81 mg daily by mouth.  ? atorvastatin (LIPITOR) 20 MG tablet Take 1 tablet (20 mg total) by mouth daily.  ? Blood Pressure Monitoring (BLOOD PRESSURE KIT) DEVI 1 kit by Does not apply route daily.  ? brimonidine (ALPHAGAN) 0.2 % ophthalmic solution SMARTSIG:In Eye(s)  ? Calcium Carbonate-Vitamin D (CALTRATE 600+D) 600-400 MG-UNIT per tablet Take 1 tablet by mouth 2 (two) times daily.  ? Cholecalciferol (VITAMIN D) 50 MCG (2000 UT) tablet Take 2,000 Units by mouth daily.  ? Continuous Blood Gluc Receiver (DEXCOM G6 RECEIVER) DEVI Use as directed to check blood sugar.  ? Continuous Blood Gluc Sensor (DEXCOM G6 SENSOR) MISC Change every 10 days  ? Continuous Blood Gluc Transmit (DEXCOM G6 TRANSMITTER) MISC Change every 3 months  ? dapagliflozin propanediol (FARXIGA) 10 MG TABS tablet Take 10 mg by mouth daily.  ? hydrochlorothiazide (MICROZIDE) 12.5 MG capsule Take 1 capsule (12.5 mg total) by mouth daily.  ? insulin lispro (HUMALOG KWIKPEN) 100 UNIT/ML KwikPen 3 times a day (just before each meal) 4-5-8 units, and pen needles 3/day.  ? Insulin Pen Needle (PEN NEEDLES) 32G X 4 MM MISC Use as Directed  ? Lancets (ONETOUCH DELICA PLUS [id number]) MISC CHECK BLOOD SUGAR TWICE  DAILY  ? latanoprost (XALATAN) 0.005 % ophthalmic solution SMARTSIG:In  Eye(s)  ? levothyroxine (SYNTHROID) 25 MCG tablet TAKE 1 TABLET BY MOUTH  DAILY BEFORE BREAKFAST  ? LINZESS 145 MCG CAPS capsule TAKE 1 CAPSULE BY MOUTH  DAILY BEFORE BREAKFAST  ? metoprolol succinate (TOPROL XL) 25 MG 24 hr tablet Take 1 tablet (25 mg total) by mouth daily.  ? Multiple Vitamin (MULTIVITAMIN WITH MINERALS) TABS tablet Take 1 tablet by mouth daily.  ? Omega-3 Fatty Acids (FISH OIL) 300 MG CAPS Take 1 capsule (300 mg total) by mouth daily.  ? ONETOUCH VERIO test strip TEST TWICE DAILY  ? pioglitazone (ACTOS) 30 MG tablet TAKE 1 TABLET BY MOUTH  DAILY  ? polyethylene glycol powder (GLYCOLAX/MIRALAX) 17 GM/SCOOP powder Take 17 g by mouth 2 (two) times daily as needed.  ? potassium chloride 20 MEQ TBCR Take 20 mEq by mouth 2 (two) times daily. (Patient taking differently: Take 10 mEq by mouth daily.)  ? RYBELSUS 14 MG TABS TAKE 1 TABLET BY MOUTH DAILY  ? triamcinolone ointment (KENALOG) 0.5 % APPLY TO AFFECTED AREA(S)  TOPICALLY TWICE DAILY  ? ?No facility-administered encounter medications on file as of 09/09/2021.  ? ?Recent Relevant Labs: ?Lab Results  ?Component Value Date/Time  ? HGBA1C 11.3 (A) 07/15/2021 08:24 AM  ? HGBA1C 11.5 (A) 04/30/2021 07:52 AM  ? HGBA1C 12.5 (H) 03/13/2021 10:21 AM  ? HGBA1C 10.6 (H) 04/20/2019 08:55 AM  ? MICROALBUR 2.1 (H) 03/13/2021 10:21 AM  ? MICROALBUR 0.9 06/17/2020 03:40 PM  ?  ?Kidney Function ?Lab Results  ?Component Value Date/Time  ?  CREATININE 1.73 (H) 04/01/2021 12:09 PM  ? CREATININE 1.80 (H) 03/24/2021 09:04 AM  ? CREATININE 1.63 (H) 05/02/2015 11:06 AM  ? GFR 38.42 (L) 03/13/2021 10:21 AM  ? GFRNONAA 45 (L) 07/14/2020 06:23 PM  ? GFRAA 54 (L) 11/29/2018 03:11 PM  ? ?Reviewed chart for medication changes and drug therapy problems ahead of medication adherence call. ? ?Attempted to contact patient x 3 for medication review and health check, unable to reach patient, left voicemails to return call. ?  ?Current antihyperglycemic regimen:  ?Humalog 4 units in AM,  4 units at lunch, 5 units in PM ?Rybelsus 14 mg daily ?Pioglitazone 30 mg daily ?Farxiga 10 mg daily  ?Dexcom G6  ? ?What recent interventions/DTPs have been made to improve glycemic control:  ?to increase the humalog to 3 times a day (just before each meal) 4-5-8 units ?check your blood sugar twice a day.  vary the time of day when you check, between before the 3 meals, and at bedtime.  also check if you have symptoms of your blood sugar being too high or too low. Per Dr. Ellison ? ?Have there been any recent hospitalizations or ED visits since last visit with CPP? No ? ? ?Adherence Review: ?Is the patient currently on a STATIN medication? Yes ?Is the patient currently on ACE/ARB medication? No ?Does the patient have >5 day gap between last estimated fill dates? No ? ? ?  ?Care Gaps: ?Colonoscopy-06/20/21 ?Diabetic Foot Exam-04/30/21 ?Ophthalmology-06/07/17 ?Dexa Scan - NA ?Annual Well Visit - NA ?Micro albumin-03/13/21 ?Hemoglobin A1c- 07/15/21 ? ?Star Rating Drugs: ?Farxiga 10 mg-last fill 07/18/21 90 ds ?Atorvastatin 20 mg-last fill 03/24/21 90 ds  ?Pioglitazone 30 mg-last fill 07/14/21 90 ds ? ?Tracy Ellis,CMA ?Clinical Pharmacist Assistant ?[phone]  ?

## 2021-09-10 DIAGNOSIS — E119 Type 2 diabetes mellitus without complications: Secondary | ICD-10-CM | POA: Diagnosis not present

## 2021-09-11 ENCOUNTER — Other Ambulatory Visit: Payer: Self-pay

## 2021-09-11 ENCOUNTER — Encounter: Payer: Self-pay | Admitting: Internal Medicine

## 2021-09-11 ENCOUNTER — Ambulatory Visit (INDEPENDENT_AMBULATORY_CARE_PROVIDER_SITE_OTHER): Payer: Medicare Other | Admitting: Internal Medicine

## 2021-09-11 VITALS — BP 124/78 | HR 75 | Resp 18 | Ht 72.0 in | Wt 164.0 lb

## 2021-09-11 DIAGNOSIS — N1832 Chronic kidney disease, stage 3b: Secondary | ICD-10-CM

## 2021-09-11 DIAGNOSIS — E118 Type 2 diabetes mellitus with unspecified complications: Secondary | ICD-10-CM | POA: Diagnosis not present

## 2021-09-11 DIAGNOSIS — I10 Essential (primary) hypertension: Secondary | ICD-10-CM | POA: Diagnosis not present

## 2021-09-11 LAB — COMPREHENSIVE METABOLIC PANEL
ALT: 27 U/L (ref 0–53)
AST: 40 U/L — ABNORMAL HIGH (ref 0–37)
Albumin: 3.9 g/dL (ref 3.5–5.2)
Alkaline Phosphatase: 78 U/L (ref 39–117)
BUN: 18 mg/dL (ref 6–23)
CO2: 28 mEq/L (ref 19–32)
Calcium: 9.3 mg/dL (ref 8.4–10.5)
Chloride: 101 mEq/L (ref 96–112)
Creatinine, Ser: 1.74 mg/dL — ABNORMAL HIGH (ref 0.40–1.50)
GFR: 38.82 mL/min — ABNORMAL LOW (ref >60.00)
Glucose, Bld: 199 mg/dL — ABNORMAL HIGH (ref 70–99)
Potassium: 3.3 mEq/L — ABNORMAL LOW (ref 3.5–5.1)
Sodium: 139 mEq/L (ref 135–145)
Total Bilirubin: 0.9 mg/dL (ref 0.2–1.2)
Total Protein: 6.4 g/dL (ref 6.0–8.3)

## 2021-09-11 LAB — CBC
HCT: 38.7 % — ABNORMAL LOW (ref 39.0–52.0)
Hemoglobin: 12.6 g/dL — ABNORMAL LOW (ref 13.0–17.0)
MCHC: 32.7 g/dL (ref 30.0–36.0)
MCV: 90.4 fl (ref 78.0–100.0)
Platelets: 132 10*3/uL — ABNORMAL LOW (ref 150.0–400.0)
RBC: 4.27 Mil/uL (ref 4.22–5.81)
RDW: 13.5 % (ref 11.5–15.5)
WBC: 5.8 10*3/uL (ref 4.0–10.5)

## 2021-09-11 NOTE — Progress Notes (Signed)
? ?  Subjective:  ? ?Patient ID: Michael Snyder, male    DOB: 12-May-1951, 71 y.o.   MRN: [id number] ? ?HPI ?The patient is here for follow up.  ? ?Review of Systems  ?Constitutional: Negative.   ?HENT: Negative.    ?Eyes: Negative.   ?Respiratory:  Negative for cough, chest tightness and shortness of breath.   ?Cardiovascular:  Negative for chest pain, palpitations and leg swelling.  ?Gastrointestinal:  Negative for abdominal distention, abdominal pain, constipation, diarrhea, nausea and vomiting.  ?Musculoskeletal: Negative.   ?Skin: Negative.   ?Neurological: Negative.   ?Psychiatric/Behavioral: Negative.    ? ?Objective:  ?Physical Exam ?Constitutional:   ?   Appearance: He is well-developed.  ?HENT:  ?   Head: Normocephalic and atraumatic.  ?Cardiovascular:  ?   Rate and Rhythm: Normal rate and regular rhythm.  ?Pulmonary:  ?   Effort: Pulmonary effort is normal. No respiratory distress.  ?   Breath sounds: Normal breath sounds. No wheezing or rales.  ?Abdominal:  ?   General: Bowel sounds are normal. There is no distension.  ?   Palpations: Abdomen is soft.  ?   Tenderness: There is no abdominal tenderness. There is no rebound.  ?Musculoskeletal:  ?   Cervical back: Normal range of motion.  ?Skin: ?   General: Skin is warm and dry.  ?Neurological:  ?   Mental Status: He is alert and oriented to person, place, and time.  ?   Coordination: Coordination normal.  ? ? ?Vitals:  ? 09/11/21 0959  ?BP: 124/78  ?Pulse: 75  ?Resp: 18  ?SpO2: 100%  ?Weight: 164 lb (74.4 kg)  ?Height: 6' (1.829 m)  ? ? ?This visit occurred during the SARS-CoV-2 public health emergency.  Safety protocols were in place, including screening questions prior to the visit, additional usage of staff PPE, and extensive cleaning of exam room while observing appropriate contact time as indicated for disinfecting solutions.  ? ?Assessment & Plan:  ? ?

## 2021-09-11 NOTE — Patient Instructions (Signed)
We are checking the labs today.  

## 2021-09-12 ENCOUNTER — Ambulatory Visit: Payer: Medicare Other | Admitting: Endocrinology

## 2021-09-12 DIAGNOSIS — E118 Type 2 diabetes mellitus with unspecified complications: Secondary | ICD-10-CM

## 2021-09-12 DIAGNOSIS — N1832 Chronic kidney disease, stage 3b: Secondary | ICD-10-CM | POA: Insufficient documentation

## 2021-09-12 LAB — POCT GLYCOSYLATED HEMOGLOBIN (HGB A1C): Hemoglobin A1C: 12.8 % — AB (ref 4.0–5.6)

## 2021-09-12 MED ORDER — INSULIN LISPRO (1 UNIT DIAL) 100 UNIT/ML (KWIKPEN): PEN_INJECTOR | SUBCUTANEOUS | 3 refills | Status: DC

## 2021-09-12 NOTE — Assessment & Plan Note (Signed)
Sugars have gradually been improving with dietary changes, aided by continuous glucose monitoring. His endocrinologist has been making adjustments to insulin. He is poorly controlled with last HgA1c >10. ?

## 2021-09-12 NOTE — Patient Instructions (Addendum)
I have sent a prescription to your pharmacy, to increase the humalog to 3 times a day (just before each meal) 7-9-5 units ?check your blood sugar twice a day.  vary the time of day when you check, between before the 3 meals, and at bedtime.  also check if you have symptoms of your blood sugar being too high or too low.  please keep a record of the readings and bring it to your next appointment here (or you can bring the meter itself).  You can write it on any piece of paper.  please call us sooner if your blood sugar goes below 70, or if you have a lot of readings over 200.   ?Please come back for a follow-up appointment in 1 month.   ?

## 2021-09-12 NOTE — Assessment & Plan Note (Signed)
Given his CKD he needs follow up CMP today which is ordered and CBC to screen for anemia associated with CKD. ?

## 2021-09-12 NOTE — Assessment & Plan Note (Signed)
Checking CMP and CBC today to assess stability of renal function. We discussed that his ongoing poor control of his sugar levels will worsen his renal function. BP at goal today. Checking CBC to screen for anemia associated with CKD. ?

## 2021-09-12 NOTE — Progress Notes (Signed)
? ?Subjective:  ? ? Patient ID: Michael Snyder, male    DOB: 1951/03/24, 71 y.o.   MRN: [id number] ? ?HPI ?Pt returns for f/u of diabetes mellitus:  ?DM type: Insulin-requiring type 2 ?Dx'ed: 2008 ?Complications: stage 3 CRI.   ?Therapy: insulin since 2021, and 3 oral meds.   ?DKA: never.   ?Severe hypoglycemia: never.   ?Pancreatitis: never ?Pancreatic imaging: CT (2020): Fatty atrophy. Calcification of the ducts, and dilatation of the main duct.   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   ?Other: fructosamine converts to higher A1c than A1c itself and cbg's; pt meter cbg was similar to office meter; he eats meals at 7AM, 12N, and 8PM.    ?Interval history: pt states he feels well in general.  He takes insulin as rx'ed. We downloaded continuous glucose monitor data.  Glucose varies from 90-400.  It is in general highest at 6PM-9PM, and less high at 12N-3PM.  It then decreases until 1AM.  It is then flat until 7AM.  He seldom has hypoglycemia, and these episodes are mild.   ?Past Medical History:  ?Diagnosis Date  ? Alcohol abuse   ? Arthritis   ? Diabetes mellitus, type 2 ([place])   ? Head injury, closed, without LOC 1980s  ? History of alcohol abuse   ? Hyperlipidemia   ? Hypertension   ? IV drug user   ? heroine and cocaine- h/o  ? Pacemaker   ? Medtronic Adapta ADDR01  ? Seizure disorder ([place])   ? Seizures ([place])   ? last time in 1990's  ? ? ?Past Surgical History:  ?Procedure Laterality Date  ? [place]  ? scalp  ? PACEMAKER LEAD REMOVAL N/A 03/23/2014  ? Procedure: PACEMAKER LEAD REMOVAL;  Surgeon: Evans Lance, MD;  Location: [facility];  Service: Cardiovascular;  Laterality: N/A;  ? PTDVP    ? 7/08 medtronic DDD  ? ? ?Social History  ? ?Socioeconomic History  ? Marital status: Legally Separated  ?  Spouse name: Not on file  ? Number of children: 3  ? Years of education: Not on file  ? Highest education level: Not on file  ?Occupational History  ? Occupation: Games developer, truck driver  ?Tobacco Use  ? Smoking  status: Never  ? Smokeless tobacco: Never  ?Vaping Use  ? Vaping Use: Never used  ?Substance and Sexual Activity  ? Alcohol use: No  ?  Comment: quit 20 yrs ago  ? Drug use: No  ? Sexual activity: Yes  ?  Partners: Female  ?Other Topics Concern  ? Not on file  ?Social History Narrative  ? 12th grade education. H/O substance abuse- now abstemious and works with other in recovery..Attends NA on a regular basis and serves as a sponsor. Married 18 years-divorced. Married 5 years 2nd marriage.3 daughters previous marriage, 9 grandchildren, 3 great-grands. Work- Merchant navy officer  ?   ?   ?   ? ?Social Determinants of Health  ? ?Financial Resource Strain: Not on file  ?Food Insecurity: Not on file  ?Transportation Needs: Not on file  ?Physical Activity: Not on file  ?Stress: Not on file  ?Social Connections: Not on file  ?Intimate Partner Violence: Not on file  ? ? ?Current Outpatient Medications on File Prior to Visit  ?Medication Sig Dispense Refill  ? aspirin EC 81 MG tablet Take 81 mg daily by mouth.    ? atorvastatin (LIPITOR) 20 MG tablet Take 1 tablet (20 mg total)  by mouth daily. 90 tablet 3  ? Blood Pressure Monitoring (BLOOD PRESSURE KIT) DEVI 1 kit by Does not apply route daily. 1 each 0  ? brimonidine (ALPHAGAN) 0.2 % ophthalmic solution SMARTSIG:In Eye(s)    ? Calcium Carbonate-Vitamin D (CALTRATE 600+D) 600-400 MG-UNIT per tablet Take 1 tablet by mouth 2 (two) times daily. 180 tablet 3  ? Cholecalciferol (VITAMIN D) 50 MCG (2000 UT) tablet Take 2,000 Units by mouth daily.    ? Continuous Blood Gluc Receiver (DEXCOM G6 RECEIVER) DEVI Use as directed to check blood sugar. 1 each 0  ? Continuous Blood Gluc Sensor (DEXCOM G6 SENSOR) MISC Change every 10 days 9 each 3  ? Continuous Blood Gluc Transmit (DEXCOM G6 TRANSMITTER) MISC Change every 3 months 1 each 3  ? dapagliflozin propanediol (FARXIGA) 10 MG TABS tablet Take 10 mg by mouth daily.    ? hydrochlorothiazide (MICROZIDE) 12.5 MG capsule Take 1  capsule (12.5 mg total) by mouth daily. 90 capsule 3  ? Insulin Pen Needle (PEN NEEDLES) 32G X 4 MM MISC Use as Directed 100 each 5  ? Lancets (ONETOUCH DELICA PLUS [id number]) MISC CHECK BLOOD SUGAR TWICE  DAILY 200 each 3  ? latanoprost (XALATAN) 0.005 % ophthalmic solution SMARTSIG:In Eye(s)    ? levothyroxine (SYNTHROID) 25 MCG tablet TAKE 1 TABLET BY MOUTH  DAILY BEFORE BREAKFAST 90 tablet 3  ? LINZESS 145 MCG CAPS capsule TAKE 1 CAPSULE BY MOUTH  DAILY BEFORE BREAKFAST 90 capsule 3  ? metoprolol succinate (TOPROL XL) 25 MG 24 hr tablet Take 1 tablet (25 mg total) by mouth daily. 90 tablet 3  ? Multiple Vitamin (MULTIVITAMIN WITH MINERALS) TABS tablet Take 1 tablet by mouth daily.    ? Omega-3 Fatty Acids (FISH OIL) 300 MG CAPS Take 1 capsule (300 mg total) by mouth daily. 90 capsule 3  ? ONETOUCH VERIO test strip TEST TWICE DAILY 200 strip 3  ? pioglitazone (ACTOS) 30 MG tablet TAKE 1 TABLET BY MOUTH  DAILY 90 tablet 3  ? polyethylene glycol powder (GLYCOLAX/MIRALAX) 17 GM/SCOOP powder Take 17 g by mouth 2 (two) times daily as needed. 255 g 0  ? potassium chloride 20 MEQ TBCR Take 20 mEq by mouth 2 (two) times daily. (Patient taking differently: Take 10 mEq by mouth daily.) 180 tablet 3  ? RYBELSUS 14 MG TABS TAKE 1 TABLET BY MOUTH DAILY 90 tablet 3  ? triamcinolone ointment (KENALOG) 0.5 % APPLY TO AFFECTED AREA(S)  TOPICALLY TWICE DAILY 90 g 0  ? ?No current facility-administered medications on file prior to visit.  ? ? ?Allergies  ?Allergen Reactions  ? Clindamycin/Lincomycin Swelling  ?  Possible facial and lip swelling  ? Ace Inhibitors   ?  Facial and lip swelling possibly associated  ? ? ?Family History  ?Problem Relation Age of Onset  ? Hypertension Mother   ? COPD Mother   ? Hypertension Brother   ? Cancer Brother   ?     leukemia  ? Hypertension Brother   ? Diabetes Neg Hx   ? ? ?There were no vitals taken for this visit. ? ? ?Review of Systems ? ?   ?Objective:  ? Physical Exam ? ? ? ?A1c=12.8% ?    ?Assessment & Plan:  ?Insulin-requiring type 2 DM: uncontrolled.  He may need to change to qam insulin.  He is evolving type 1.  ? ?Patient Instructions  ?I have sent a prescription to your pharmacy, to increase the humalog to 3 times  a day (just before each meal) 7-9-5 units ?check your blood sugar twice a day.  vary the time of day when you check, between before the 3 meals, and at bedtime.  also check if you have symptoms of your blood sugar being too high or too low.  please keep a record of the readings and bring it to your next appointment here (or you can bring the meter itself).  You can write it on any piece of paper.  please call us sooner if your blood sugar goes below 70, or if you have a lot of readings over 200.   ?Please come back for a follow-up appointment in 1 month.   ?   ? ?

## 2021-10-03 ENCOUNTER — Encounter: Payer: Self-pay | Admitting: Endocrinology

## 2021-10-03 ENCOUNTER — Ambulatory Visit (INDEPENDENT_AMBULATORY_CARE_PROVIDER_SITE_OTHER): Payer: Medicare Other | Admitting: Endocrinology

## 2021-10-03 VITALS — BP 144/92 | HR 88 | Ht 72.0 in | Wt 165.8 lb

## 2021-10-03 DIAGNOSIS — E118 Type 2 diabetes mellitus with unspecified complications: Secondary | ICD-10-CM

## 2021-10-03 MED ORDER — INSULIN LISPRO (1 UNIT DIAL) 100 UNIT/ML (KWIKPEN): PEN_INJECTOR | SUBCUTANEOUS | 3 refills | Status: DC

## 2021-10-03 NOTE — Patient Instructions (Addendum)
I have sent a prescription to your pharmacy, to increase the humalog to 3 times a day (just before each meal) 8-14-6 units ?check your blood sugar twice a day.  vary the time of day when you check, between before the 3 meals, and at bedtime.  also check if you have symptoms of your blood sugar being too high or too low.  please keep a record of the readings and bring it to your next appointment here (or you can bring the meter itself).  You can write it on any piece of paper.  please call us sooner if your blood sugar goes below 70, or if you have a lot of readings over 200.   ?Please come back for a follow-up appointment in 1 month.   ?

## 2021-10-03 NOTE — Progress Notes (Signed)
? ?Subjective:  ? ? Patient ID: Michael Snyder, male    DOB: Jan 25, 1951, 71 y.o.   MRN: [id number] ? ?HPI ?Pt returns for f/u of diabetes mellitus:  ?DM type: Insulin-requiring type 2 ?Dx'ed: 2008 ?Complications: stage 3 CRI.   ?Therapy: insulin since 2021, and 3 oral meds.   ?DKA: never.   ?Severe hypoglycemia: never.   ?Pancreatitis: never ?Pancreatic imaging: CT (2020): Fatty atrophy. Calcification of the ducts, and dilatation of the main duct.   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   ?Other: fructosamine converts to higher A1c than A1c itself and cbg's; pt meter cbg was similar to office meter; he eats meals at 7AM, 12N, and 8PM.    ?Interval history: pt states he feels well in general.  He takes insulin as rx'ed. We downloaded continuous glucose monitor data.  Glucose varies from 100-400.  It is in general highest at 4PM, and lowest 2AM-8AM. It is then flat overnight. He seldom has hypoglycemia, and these episodes are mild.   ?Past Medical History:  ?Diagnosis Date  ? Alcohol abuse   ? Arthritis   ? Diabetes mellitus, type 2 ([place])   ? Head injury, closed, without LOC 1980s  ? History of alcohol abuse   ? Hyperlipidemia   ? Hypertension   ? IV drug user   ? heroine and cocaine- h/o  ? Pacemaker   ? Medtronic Adapta ADDR01  ? Seizure disorder ([place])   ? Seizures ([place])   ? last time in 1990's  ? ? ?Past Surgical History:  ?Procedure Laterality Date  ? [place]  ? scalp  ? PACEMAKER LEAD REMOVAL N/A 03/23/2014  ? Procedure: PACEMAKER LEAD REMOVAL;  Surgeon: Evans Lance, MD;  Location: [facility];  Service: Cardiovascular;  Laterality: N/A;  ? PTDVP    ? 7/08 medtronic DDD  ? ? ?Social History  ? ?Socioeconomic History  ? Marital status: Legally Separated  ?  Spouse name: Not on file  ? Number of children: 3  ? Years of education: Not on file  ? Highest education level: Not on file  ?Occupational History  ? Occupation: Games developer, truck driver  ?Tobacco Use  ? Smoking status: Never  ? Smokeless tobacco: Never   ?Vaping Use  ? Vaping Use: Never used  ?Substance and Sexual Activity  ? Alcohol use: No  ?  Comment: quit 20 yrs ago  ? Drug use: No  ? Sexual activity: Yes  ?  Partners: Female  ?Other Topics Concern  ? Not on file  ?Social History Narrative  ? 12th grade education. H/O substance abuse- now abstemious and works with other in recovery..Attends NA on a regular basis and serves as a sponsor. Married 18 years-divorced. Married 5 years 2nd marriage.3 daughters previous marriage, 9 grandchildren, 3 great-grands. Work- Merchant navy officer  ?   ?   ?   ? ?Social Determinants of Health  ? ?Financial Resource Strain: Not on file  ?Food Insecurity: Not on file  ?Transportation Needs: Not on file  ?Physical Activity: Not on file  ?Stress: Not on file  ?Social Connections: Not on file  ?Intimate Partner Violence: Not on file  ? ? ?Current Outpatient Medications on File Prior to Visit  ?Medication Sig Dispense Refill  ? aspirin EC 81 MG tablet Take 81 mg daily by mouth.    ? atorvastatin (LIPITOR) 20 MG tablet Take 1 tablet (20 mg total) by mouth daily. 90 tablet 3  ? Blood Pressure Monitoring (  BLOOD PRESSURE KIT) DEVI 1 kit by Does not apply route daily. 1 each 0  ? brimonidine (ALPHAGAN) 0.2 % ophthalmic solution SMARTSIG:In Eye(s)    ? Calcium Carbonate-Vitamin D (CALTRATE 600+D) 600-400 MG-UNIT per tablet Take 1 tablet by mouth 2 (two) times daily. 180 tablet 3  ? Cholecalciferol (VITAMIN D) 50 MCG (2000 UT) tablet Take 2,000 Units by mouth daily.    ? Continuous Blood Gluc Receiver (DEXCOM G6 RECEIVER) DEVI Use as directed to check blood sugar. 1 each 0  ? Continuous Blood Gluc Sensor (DEXCOM G6 SENSOR) MISC Change every 10 days 9 each 3  ? Continuous Blood Gluc Transmit (DEXCOM G6 TRANSMITTER) MISC Change every 3 months 1 each 3  ? dapagliflozin propanediol (FARXIGA) 10 MG TABS tablet Take 10 mg by mouth daily.    ? hydrochlorothiazide (MICROZIDE) 12.5 MG capsule Take 1 capsule (12.5 mg total) by mouth daily. 90  capsule 3  ? Insulin Pen Needle (PEN NEEDLES) 32G X 4 MM MISC Use as Directed 100 each 5  ? Lancets (ONETOUCH DELICA PLUS [id number]) MISC CHECK BLOOD SUGAR TWICE  DAILY 200 each 3  ? latanoprost (XALATAN) 0.005 % ophthalmic solution SMARTSIG:In Eye(s)    ? levothyroxine (SYNTHROID) 25 MCG tablet TAKE 1 TABLET BY MOUTH  DAILY BEFORE BREAKFAST 90 tablet 3  ? LINZESS 145 MCG CAPS capsule TAKE 1 CAPSULE BY MOUTH  DAILY BEFORE BREAKFAST 90 capsule 3  ? metoprolol succinate (TOPROL XL) 25 MG 24 hr tablet Take 1 tablet (25 mg total) by mouth daily. 90 tablet 3  ? Multiple Vitamin (MULTIVITAMIN WITH MINERALS) TABS tablet Take 1 tablet by mouth daily.    ? Omega-3 Fatty Acids (FISH OIL) 300 MG CAPS Take 1 capsule (300 mg total) by mouth daily. 90 capsule 3  ? ONETOUCH VERIO test strip TEST TWICE DAILY 200 strip 3  ? pioglitazone (ACTOS) 30 MG tablet TAKE 1 TABLET BY MOUTH  DAILY 90 tablet 3  ? polyethylene glycol powder (GLYCOLAX/MIRALAX) 17 GM/SCOOP powder Take 17 g by mouth 2 (two) times daily as needed. 255 g 0  ? potassium chloride 20 MEQ TBCR Take 20 mEq by mouth 2 (two) times daily. (Patient taking differently: Take 10 mEq by mouth daily.) 180 tablet 3  ? RYBELSUS 14 MG TABS TAKE 1 TABLET BY MOUTH DAILY 90 tablet 3  ? triamcinolone ointment (KENALOG) 0.5 % APPLY TO AFFECTED AREA(S)  TOPICALLY TWICE DAILY 90 g 0  ? ?No current facility-administered medications on file prior to visit.  ? ? ?Allergies  ?Allergen Reactions  ? Clindamycin/Lincomycin Swelling  ?  Possible facial and lip swelling  ? Ace Inhibitors   ?  Facial and lip swelling possibly associated  ? ? ?Family History  ?Problem Relation Age of Onset  ? Hypertension Mother   ? COPD Mother   ? Hypertension Brother   ? Cancer Brother   ?     leukemia  ? Hypertension Brother   ? Diabetes Neg Hx   ? ? ?BP (!) 144/92 (BP Location: Left Arm, Patient Position: Sitting, Cuff Size: Normal)   Pulse 88   Ht 6' (1.829 m)   Wt 165 lb 12.8 oz (75.2 kg)   SpO2 98%   BMI  22.49 kg/m?  ? ? ?Review of Systems ? ?   ?Objective:  ? Physical Exam ?VITAL SIGNS:  See vs page ?GENERAL: no distress. ? ? ? ?   ?Assessment & Plan:  ?Insulin-requiring type 2 DM: uncontrolled. ? ?Patient Instructions  ?  I have sent a prescription to your pharmacy, to increase the humalog to 3 times a day (just before each meal) 8-14-6 units ?check your blood sugar twice a day.  vary the time of day when you check, between before the 3 meals, and at bedtime.  also check if you have symptoms of your blood sugar being too high or too low.  please keep a record of the readings and bring it to your next appointment here (or you can bring the meter itself).  You can write it on any piece of paper.  please call us sooner if your blood sugar goes below 70, or if you have a lot of readings over 200.   ?Please come back for a follow-up appointment in 1 month.   ? ? ?

## 2021-10-09 ENCOUNTER — Encounter: Payer: Self-pay | Admitting: Dietician

## 2021-10-09 ENCOUNTER — Encounter: Payer: Medicare Other | Attending: Endocrinology | Admitting: Dietician

## 2021-10-09 DIAGNOSIS — E119 Type 2 diabetes mellitus without complications: Secondary | ICD-10-CM | POA: Insufficient documentation

## 2021-10-09 DIAGNOSIS — Z713 Dietary counseling and surveillance: Secondary | ICD-10-CM | POA: Diagnosis not present

## 2021-10-09 DIAGNOSIS — E118 Type 2 diabetes mellitus with unspecified complications: Secondary | ICD-10-CM

## 2021-10-09 NOTE — Progress Notes (Signed)
?Diabetes Self-Management Education ? ?Visit Type: Follow-up ? ?Appt. Start Time: 0915 Appt. End Time: 6389 ? ?10/10/2021 ? ?Mr. Liston Thum, identified by name and date of birth, is a 71 y.o. male with a diagnosis of Diabetes:  .  ? ?ASSESSMENT ?Patient is here today alone.  He was last seen by this RD on 09/04/2021. ? ?He is now exercising more as he continues to do home improvements and also takes care of 3 yards. ?Weight has increased reflecting improved blood glucose control. ?Fasting glucose today 74 at 2 am today and 161 before breakfast ?He mixed up the directions for his [place] and Rybelsus.  Instructed to take the Rybelsus with very little water first thing in the am and eat 30 minutes later. ?He reduced his am Humalog to 5 units due to low blood sugar. ? ?History includes Type 2 Diabetes, HTN, HLD, CKD3, hypothyroidism ?Labs noted to include A1C 11.3% 07/15/2021 decreased from 12.5% 03/13/2021.  eGFR 42, BUN 18, Creatinine 1.73, Potassium 4.3 03/12/2021. ?Medications include:   Humalog 5 units before breakfast, 14 units before lunch, 6 units before dinner; Farxiga, Actos, Rybelsus (taking incorrectly), Vitamin D, Omega 3, Calcium, MVI ?Sleep good. ?Dexcom G6 ?  ?Weight hx: ?169 lbs 10/09/2021 ?160 lbs 09/04/2021 ?161 lbs 08/19/2020 ?UBW 179-190 lbs ?  ?Patient lives with his brother.  He does much of the shopping and cooking.  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  States that he gets a lot of exercise with his jobs.  He enjoys gardening in the summer. ? ? ? Diabetes Self-Management Education - 10/10/21 1700   ? ?  ? Visit Information  ? Visit Type Follow-up   ?  ? Pre-Education Assessment  ? Patient understands the diabetes disease and treatment process. Demonstrates understanding / competency   ? Patient understands incorporating nutritional management into lifestyle. Needs Review   ? Patient undertands incorporating physical activity into lifestyle.  Demonstrates understanding / competency   ? Patient understands using medications safely. Demonstrates understanding / competency   ? Patient understands monitoring blood glucose, interpreting and using results Demonstrates understanding / competency   ? Patient understands prevention, detection, and treatment of acute complications. Demonstrates understanding / competency   ? Patient understands prevention, detection, and treatment of chronic complications. Demonstrates understanding / competency   ? Patient understands how to develop strategies to address psychosocial issues. Needs Review   ? Patient understands how to develop strategies to promote health/change behavior. Needs Review   ?  ? Complications  ? Last HgB A1C per patient/outside source 12.8 %   ? How often do you check your blood sugar? 1-2 times/day   ? Fasting Blood glucose range (mg/dL) 70-129   ? Number of hypoglycemic episodes per month 0   ?  ? Dietary Intake  ? Breakfast egg with 2 slices of [employer] toast   ? Lunch salad with grilled chicken, crackers, banana   ? Snack (afternoon) peanuts or occasional nabs   ? Dinner grilled chicken salad with crackers OR grilled chicken or fried fish, [employer] bread, beans, greens   ? Beverage(s) water, zero gatorade, celery cucumber juice with very few grapes, lemon and lime   ?  ? Exercise  ? Exercise Type Moderate (swimming / aerobic walking)   ? How many days per week to you exercise? 7   ? How many minutes per day do you exercise? 60   ? Total minutes per  week of exercise 420   ?  ? Patient Education  ? Previous Diabetes Education Yes (please comment)   ? Nutrition management  Role of diet in the treatment of diabetes and the relationship between the three main macronutrients and blood glucose level   ? Medications Reviewed patients medication for diabetes, action, purpose, timing of dose and side effects.   ?  ? Individualized Goals (developed by patient)  ? Nutrition General guidelines for healthy choices and  portions discussed   ? Physical Activity Exercise 5-7 days per week;60 minutes per day   ? Medications take my medication as prescribed   ? Monitoring  test my blood glucose as discussed   ? Reducing Risk examine blood glucose patterns;increase portions of healthy fats   ?  ? Patient Self-Evaluation of Goals - Patient rates self as meeting previously set goals (% of time)  ? Nutrition >75%   ? Physical Activity >75%   ? Medications 50 - 75 %   ? Monitoring >75%   ? Problem Solving 50 - 75 %   ? Reducing Risk 50 - 75 %   ? Health Coping >75%   ?  ? Post-Education Assessment  ? Patient understands the diabetes disease and treatment process. Demonstrates understanding / competency   ? Patient understands incorporating nutritional management into lifestyle. Needs Review   ? Patient undertands incorporating physical activity into lifestyle. Demonstrates understanding / competency   ? Patient understands using medications safely. Demonstrates understanding / competency   ? Patient understands monitoring blood glucose, interpreting and using results Demonstrates understanding / competency   ? Patient understands prevention, detection, and treatment of acute complications. Demonstrates understanding / competency   ? Patient understands prevention, detection, and treatment of chronic complications. Demonstrates understanding / competency   ? Patient understands how to develop strategies to address psychosocial issues. Demonstrates understanding / competency   ? Patient understands how to develop strategies to promote health/change behavior. Needs Review   ?  ? Outcomes  ? Expected Outcomes Demonstrated interest in learning. Expect positive outcomes   ? Future DMSE 2 months   ? Program Status Not Completed   ?  ? Subsequent Visit  ? Since your last visit have you continued or begun to take your medications as prescribed? No   ? Since your last visit have you experienced any weight changes? Gain   ? Weight Gain (lbs) 9   ?  Since your last visit, are you checking your blood glucose at least once a day? Yes   ? ?  ?  ? ?  ? ? ?Individualized Plan for Diabetes Self-Management Training:  ? ?Learning Objective:  Patient will have a greater understanding of diabetes self-management. ?Patient education plan is to attend individual and/or group sessions per assessed needs and concerns. ?  ?Plan:  ? ?Patient Instructions  ?Take the Rybelsus 30 minutes before breakfast with only a very small amount of water. ? ?Continue to stay active ? ?Continue to be mindful about your food choices. ?Foods that are less processed are best. ?Include carbohydrates with each meal (see my plate) ? ?Expected Outcomes:  Demonstrated interest in learning. Expect positive outcomes ? ?Education material provided: My Plate ? ?If problems or questions, patient to contact team via:  Phone ? ?Future DSME appointment: 2 months ?

## 2021-10-09 NOTE — Patient Instructions (Addendum)
Take the Rybelsus 30 minutes before breakfast with only a very small amount of water. ? ?Continue to stay active ? ?Continue to be mindful about your food choices. ?Foods that are less processed are best. ?Include carbohydrates with each meal (see my plate) ?

## 2021-10-15 ENCOUNTER — Other Ambulatory Visit: Payer: Self-pay

## 2021-10-15 DIAGNOSIS — E119 Type 2 diabetes mellitus without complications: Secondary | ICD-10-CM

## 2021-10-15 MED ORDER — PEN NEEDLES 32G X 4 MM MISC: 5 refills | Status: DC

## 2021-10-17 ENCOUNTER — Telehealth: Payer: Self-pay

## 2021-10-17 NOTE — Progress Notes (Signed)
? ? ?Chronic Care Management ?Pharmacy Assistant  ? ?Name: Michael Snyder  MRN: [id number] DOB: 1950/12/06 ? ? ? ?Reason for Encounter: Disease State ?  ?Conditions to be addressed/monitored: ?DMII ? ?Recent office visits:  ?09/11/21 Hoyt Koch, MD-PCP (Stage 3b kidney disease) Labs ordered-CBC,CMP ? ?Recent consult visits:  ?10/03/21 Renato Shin, MD-Endocrinology (Diabetes mellitus) Med changes:insulin lispro (HUMALOG KWIKPEN) 100 UNIT/ML KwikPen-3 times a day (just before each meal) 8-14-6 units, and pen needles 3/day., Normal ? ?09/12/21 Renato Shin, MD-Endocrinology (Diabetes mellitus) Med changes:insulin lispro (HUMALOG KWIKPEN) 100 UNIT/ML KwikPen-3 times a day (just before each meal) 7-9-5 units, and pen needles 3/day., Normal ? ?Hospital visits:  ?None since last coordination call ? ?Medications: ?Outpatient Encounter Medications as of 10/17/2021  ?Medication Sig  ? aspirin EC 81 MG tablet Take 81 mg daily by mouth.  ? atorvastatin (LIPITOR) 20 MG tablet Take 1 tablet (20 mg total) by mouth daily.  ? Blood Pressure Monitoring (BLOOD PRESSURE KIT) DEVI 1 kit by Does not apply route daily.  ? brimonidine (ALPHAGAN) 0.2 % ophthalmic solution SMARTSIG:In Eye(s)  ? Calcium Carbonate-Vitamin D (CALTRATE 600+D) 600-400 MG-UNIT per tablet Take 1 tablet by mouth 2 (two) times daily.  ? Cholecalciferol (VITAMIN D) 50 MCG (2000 UT) tablet Take 2,000 Units by mouth daily.  ? Continuous Blood Gluc Receiver (DEXCOM G6 RECEIVER) DEVI Use as directed to check blood sugar.  ? Continuous Blood Gluc Sensor (DEXCOM G6 SENSOR) MISC Change every 10 days  ? Continuous Blood Gluc Transmit (DEXCOM G6 TRANSMITTER) MISC Change every 3 months  ? dapagliflozin propanediol (FARXIGA) 10 MG TABS tablet Take 10 mg by mouth daily.  ? hydrochlorothiazide (MICROZIDE) 12.5 MG capsule Take 1 capsule (12.5 mg total) by mouth daily.  ? insulin lispro (HUMALOG KWIKPEN) 100 UNIT/ML KwikPen 3 times a day (just before each meal) 8-14-6 units,  and pen needles 3/day.  ? Insulin Pen Needle (PEN NEEDLES) 32G X 4 MM MISC Use as Directed 3 times daily to inject insulin  ? Lancets (ONETOUCH DELICA PLUS [id number]) MISC CHECK BLOOD SUGAR TWICE  DAILY  ? latanoprost (XALATAN) 0.005 % ophthalmic solution SMARTSIG:In Eye(s)  ? levothyroxine (SYNTHROID) 25 MCG tablet TAKE 1 TABLET BY MOUTH  DAILY BEFORE BREAKFAST  ? LINZESS 145 MCG CAPS capsule TAKE 1 CAPSULE BY MOUTH  DAILY BEFORE BREAKFAST  ? metoprolol succinate (TOPROL XL) 25 MG 24 hr tablet Take 1 tablet (25 mg total) by mouth daily.  ? Multiple Vitamin (MULTIVITAMIN WITH MINERALS) TABS tablet Take 1 tablet by mouth daily.  ? Omega-3 Fatty Acids (FISH OIL) 300 MG CAPS Take 1 capsule (300 mg total) by mouth daily.  ? ONETOUCH VERIO test strip TEST TWICE DAILY  ? pioglitazone (ACTOS) 30 MG tablet TAKE 1 TABLET BY MOUTH  DAILY  ? polyethylene glycol powder (GLYCOLAX/MIRALAX) 17 GM/SCOOP powder Take 17 g by mouth 2 (two) times daily as needed.  ? potassium chloride 20 MEQ TBCR Take 20 mEq by mouth 2 (two) times daily. (Patient taking differently: Take 10 mEq by mouth daily.)  ? RYBELSUS 14 MG TABS TAKE 1 TABLET BY MOUTH DAILY  ? triamcinolone ointment (KENALOG) 0.5 % APPLY TO AFFECTED AREA(S)  TOPICALLY TWICE DAILY  ? ?No facility-administered encounter medications on file as of 10/17/2021.  ? ?Recent Relevant Labs: ?Lab Results  ?Component Value Date/Time  ? HGBA1C 12.8 (A) 09/12/2021 08:09 AM  ? HGBA1C 11.3 (A) 07/15/2021 08:24 AM  ? HGBA1C 12.5 (H) 03/13/2021 10:21 AM  ? HGBA1C 10.6 (H) 04/20/2019  08:55 AM  ? MICROALBUR 2.1 (H) 03/13/2021 10:21 AM  ? MICROALBUR 0.9 06/17/2020 03:40 PM  ?  ?Kidney Function ?Lab Results  ?Component Value Date/Time  ? CREATININE 1.74 (H) 09/11/2021 10:31 AM  ? CREATININE 1.73 (H) 04/01/2021 12:09 PM  ? CREATININE 1.63 (H) 05/02/2015 11:06 AM  ? GFR 38.82 (L) 09/11/2021 10:31 AM  ? GFRNONAA 45 (L) 07/14/2020 06:23 PM  ? GFRAA 54 (L) 11/29/2018 03:11 PM  ? ? ?Current antihyperglycemic  regimen:  ?Humalog 4 units in AM, 4 units at lunch, 5 units in PM ?Rybelsus 14 mg daily ?Pioglitazone 30 mg daily ?Farxiga 10 mg daily  ?Dexcom G6  ? ?What recent interventions/DTPs have been made to improve glycemic control:  ?to increase the humalog to 3 times a day (just before each meal) 8-14-6 units ?check your blood sugar twice a day ? ?Have there been any recent hospitalizations or ED visits since last visit with CPP? No, not since last coordination call ? ? ?Adherence Review: ?Is the patient currently on a STATIN medication? Yes ?Is the patient currently on ACE/ARB medication? No ?Does the patient have >5 day gap between last estimated fill dates? Yes ? ?Care Gaps: ?Colonoscopy-06/20/21 ?Diabetic Foot Exam-04/30/21 ?Ophthalmology-08/19/21 ?Dexa Scan - NA ?Annual Well Visit - NA ?Micro albumin-03/13/21 ?Hemoglobin A1c- 09/12/21 ?  ?Star Rating Drugs: ?Farxiga 10 mg-last fill 07/18/21 90 ds ?Atorvastatin 20 mg-last fill 03/24/21 90 ds  ?Pioglitazone 30 mg-last fill 09/16/21 100 ds ?  ?Ethelene Hal ?Clinical Pharmacist Assistant ?[phone]  ?

## 2021-10-30 ENCOUNTER — Other Ambulatory Visit: Payer: Self-pay | Admitting: Student

## 2021-10-31 ENCOUNTER — Ambulatory Visit (INDEPENDENT_AMBULATORY_CARE_PROVIDER_SITE_OTHER): Payer: Medicare Other

## 2021-10-31 DIAGNOSIS — E119 Type 2 diabetes mellitus without complications: Secondary | ICD-10-CM | POA: Diagnosis not present

## 2021-10-31 DIAGNOSIS — I495 Sick sinus syndrome: Secondary | ICD-10-CM | POA: Diagnosis not present

## 2021-11-04 LAB — CUP PACEART REMOTE DEVICE CHECK
Battery Impedance: 526 Ohm
Battery Remaining Longevity: 97 mo
Battery Voltage: 2.79 V
Brady Statistic AP VP Percent: 0 %
Brady Statistic AP VS Percent: 11 %
Brady Statistic AS VP Percent: 0 %
Brady Statistic AS VS Percent: 89 %
Date Time Interrogation Session: 20230501140754
Implantable Lead Implant Date: 20080711
Implantable Lead Implant Date: 20150918
Implantable Lead Location: 753859
Implantable Lead Location: 753860
Implantable Lead Model: 5076
Implantable Lead Model: 5076
Implantable Pulse Generator Implant Date: 20150918
Lead Channel Impedance Value: 476 Ohm
Lead Channel Impedance Value: 486 Ohm
Lead Channel Pacing Threshold Amplitude: 0.75 V
Lead Channel Pacing Threshold Amplitude: 0.875 V
Lead Channel Pacing Threshold Pulse Width: 0.4 ms
Lead Channel Pacing Threshold Pulse Width: 0.4 ms
Lead Channel Setting Pacing Amplitude: 2 V
Lead Channel Setting Pacing Amplitude: 2.5 V
Lead Channel Setting Pacing Pulse Width: 0.4 ms
Lead Channel Setting Sensing Sensitivity: 2 mV

## 2021-11-11 ENCOUNTER — Other Ambulatory Visit: Payer: Self-pay

## 2021-11-11 ENCOUNTER — Ambulatory Visit (INDEPENDENT_AMBULATORY_CARE_PROVIDER_SITE_OTHER): Payer: Medicare Other

## 2021-11-11 ENCOUNTER — Telehealth: Payer: Medicare Other

## 2021-11-11 VITALS — BP 120/74

## 2021-11-11 DIAGNOSIS — I10 Essential (primary) hypertension: Secondary | ICD-10-CM

## 2021-11-11 DIAGNOSIS — E119 Type 2 diabetes mellitus without complications: Secondary | ICD-10-CM

## 2021-11-11 DIAGNOSIS — E039 Hypothyroidism, unspecified: Secondary | ICD-10-CM

## 2021-11-11 MED ORDER — ATORVASTATIN CALCIUM 20 MG PO TABS: 20.0000 mg | ORAL_TABLET | Freq: Every day | ORAL | 3 refills | Status: DC

## 2021-11-11 NOTE — Progress Notes (Signed)
? ?Chronic Care Management ?Pharmacy Note ? ?11/11/2021 ?Name:  Michael Snyder MRN:  [id number] DOB:  1951-06-27 ? ?Summary: ?-Continues to follow closely with endocrinology about humalog dosing - reports that he had to decrease his lunchtime dose to 12 units with dinner as he was having lows into the 60's when using 14 units  ?-Dexcom unable to be downloaded today, pt reports that typically BG has been averaging 134-146 ?-BP at home well controlled - typically averaging 120's/70's  ? ?Recommendations/Changes made from today's visit: ?-Patient to continue current medications, follow up with endo in 2 days regarding adjustments to medications for improved BG control  ?-Patient to continue monitoring BP 2-3 times weekly, make clinic aware should BP average >140/90 ? ? ?Subjective: ?Michael Snyder is an 71 y.o. year old male who is a primary patient of Hoyt Koch, MD.  The CCM team was consulted for assistance with disease management and care coordination needs.   ? ?Engaged with patient by telephone for follow up visit in response to provider referral for pharmacy case management and/or care coordination services.  ? ?Consent to Services:  ?The patient was given information about Chronic Care Management services, agreed to services, and gave verbal consent prior to initiation of services.  Please see initial visit note for detailed documentation.  ? ?Patient Care Team: ?Hoyt Koch, MD as PCP - General (Internal Medicine) ?Deboraha Sprang, MD as PCP - Electrophysiology (Cardiology) ?Tomasa Blase, [facility] (Pharmacist) ? ? ?Recent office visits: ?09/11/2021 - Dr. Sharlet Salina - no changes to medications - f/u with endocrinology  ? ?Recent consult visits: ?10/03/2021 - Dr. Loanne Drilling - Endocrinology - increase humalog - 8-14-6 units with meals - f/u in 1 month  ?09/12/2021 - Dr. Loanne Drilling - Endocrinology - increase humalog 7-9-5 units  with meals  ?08/15/2021 - Dr. Loanne Drilling - endocrinology - increase humalog - 4-5-8  units with meals - f/u in 1 month ? ?Hospital visits: ?None in previous 6 months ? ? ?Objective: ? ?Lab Results  ?Component Value Date  ? CREATININE 1.74 (H) 09/11/2021  ? BUN 18 09/11/2021  ? GFR 38.82 (L) 09/11/2021  ? GFRNONAA 45 (L) 07/14/2020  ? GFRAA 54 (L) 11/29/2018  ? NA 139 09/11/2021  ? K 3.3 (L) 09/11/2021  ? CALCIUM 9.3 09/11/2021  ? CO2 28 09/11/2021  ? GLUCOSE 199 (H) 09/11/2021  ? ? ?Lab Results  ?Component Value Date/Time  ? HGBA1C 12.8 (A) 09/12/2021 08:09 AM  ? HGBA1C 11.3 (A) 07/15/2021 08:24 AM  ? HGBA1C 12.5 (H) 03/13/2021 10:21 AM  ? HGBA1C 10.6 (H) 04/20/2019 08:55 AM  ? FRUCTOSAMINE 569 (H) 08/02/2020 08:15 AM  ? FRUCTOSAMINE 442 (H) 02/01/2020 08:14 AM  ? GFR 38.82 (L) 09/11/2021 10:31 AM  ? GFR 38.42 (L) 03/13/2021 10:21 AM  ? MICROALBUR 2.1 (H) 03/13/2021 10:21 AM  ? MICROALBUR 0.9 06/17/2020 03:40 PM  ?  ?Last diabetic Eye exam:  ?Lab Results  ?Component Value Date/Time  ? HMDIABEYEEXA No Retinopathy 08/19/2021 02:22 PM  ?  ?Last diabetic Foot exam: No results found for: HMDIABFOOTEX  ? ?Lab Results  ?Component Value Date  ? CHOL 119 03/13/2021  ? HDL 41.30 03/13/2021  ? [place] 57 03/13/2021  ? LDLDIRECT 102.0 04/20/2019  ? TRIG 107.0 03/13/2021  ? CHOLHDL 3 03/13/2021  ? ? ? ?  Latest Ref Rng & Units 09/11/2021  ? 10:31 AM 03/13/2021  ? 10:21 AM 07/14/2020  ?  6:23 PM  ?Hepatic Function  ?Total Protein 6.0 - 8.3  g/dL 6.4   6.7   7.3    ?Albumin 3.5 - 5.2 g/dL 3.9   4.0   4.7    ?AST 0 - 37 U/L 40   34   39    ?ALT 0 - 53 U/L $Remo'27   29   30    'bLHnU$ ?Alk Phosphatase 39 - 117 U/L 78   77   72    ?Total Bilirubin 0.2 - 1.2 mg/dL 0.9   0.8   1.1    ? ? ?Lab Results  ?Component Value Date/Time  ? TSH 4.65 (H) 08/02/2020 08:11 AM  ? TSH 2.36 06/10/2017 03:09 PM  ? ? ? ?  Latest Ref Rng & Units 09/11/2021  ? 10:31 AM 03/13/2021  ? 10:21 AM 07/14/2020  ?  6:23 PM  ?CBC  ?WBC 4.0 - 10.5 K/uL 5.8   6.0   9.7    ?Hemoglobin 13.0 - 17.0 g/dL 12.6   12.6   13.5    ?Hematocrit 39.0 - 52.0 % 38.7   38.5   39.8     ?Platelets 150.0 - 400.0 K/uL 132.0   147.0   171    ? ? ?No results found for: VD25OH ? ?Clinical ASCVD: No  ?The ASCVD Risk score (Arnett DK, et al., 2019) failed to calculate for the following reasons: ?  The valid total cholesterol range is 130 to 320 mg/dL   ? ? ?  03/13/2021  ?  9:08 AM 08/19/2020  ? 10:26 AM 02/20/2020  ?  8:51 AM  ?Depression screen PHQ 2/9  ?Decreased Interest 0 0 0  ?Down, Depressed, Hopeless 0 0 0  ?PHQ - 2 Score 0 0 0  ?  ?CHADS2VASc score: 4 ? ?Social History  ? ?Tobacco Use  ?Smoking Status Never  ?Smokeless Tobacco Never  ? ?BP Readings from Last 3 Encounters:  ?11/11/21 120/74  ?10/03/21 (!) 144/92  ?09/11/21 124/78  ? ?Pulse Readings from Last 3 Encounters:  ?10/03/21 88  ?09/11/21 75  ?08/15/21 76  ? ?Wt Readings from Last 3 Encounters:  ?10/03/21 165 lb 12.8 oz (75.2 kg)  ?09/11/21 164 lb (74.4 kg)  ?08/15/21 161 lb (73 kg)  ? ?BMI Readings from Last 3 Encounters:  ?10/03/21 22.49 kg/m?  ?09/11/21 22.24 kg/m?  ?08/15/21 21.84 kg/m?  ? ? ?Assessment/Interventions: Review of patient past medical history, allergies, medications, health status, including review of consultants reports, laboratory and other test data, was performed as part of comprehensive evaluation and provision of chronic care management services.  ? ?SDOH:  (Social Determinants of Health) assessments and interventions performed: Yes ? ?SDOH Screenings  ? ?Alcohol Screen: Not on file  ?Depression (PHQ2-9): Low Risk   ? PHQ-2 Score: 0  ?Financial Resource Strain: Not on file  ?Food Insecurity: Not on file  ?Housing: Not on file  ?Physical Activity: Not on file  ?Social Connections: Not on file  ?Stress: Not on file  ?Tobacco Use: Low Risk   ? Smoking Tobacco Use: Never  ? Smokeless Tobacco Use: Never  ? Passive Exposure: Not on file  ?Transportation Needs: Not on file  ? ? ?[place] ? ?Allergies  ?Allergen Reactions  ? Clindamycin/Lincomycin Swelling  ?  Possible facial and lip swelling  ? Ace Inhibitors   ?  Facial  and lip swelling possibly associated  ? ? ?Medications Reviewed Today   ? ? Reviewed by Tomasa Blase, [facility] (Pharmacist) on 11/11/21 at 1109  Med List Status: <None>  ? ?Medication  Order Taking? Sig Documenting Provider Last Dose Status Informant  ?aspirin EC 81 MG tablet [id number] Yes Take 81 mg daily by mouth. [provider] Taking Active   ?atorvastatin (LIPITOR) 20 MG tablet [id number] Yes Take 1 tablet (20 mg total) by mouth daily. Hoyt Koch, MD Taking Active   ?Blood Pressure Monitoring (BLOOD PRESSURE KIT) DEVI [id number]  1 kit by Does not apply route daily. Hoyt Koch, MD  Active   ?brimonidine [facility]) 0.2 % ophthalmic solution [id number] Yes SMARTSIG:In Eye(s) [provider] Taking Active   ?Calcium Carbonate-Vitamin D (CALTRATE 600+D) 600-400 MG-UNIT per tablet [id number] Yes Take 1 tablet by mouth 2 (two) times daily. Norins, Heinz Knuckles, MD Taking Active   ?Cholecalciferol (VITAMIN D) 50 MCG (2000 UT) tablet [id number] Yes Take 2,000 Units by mouth daily. [provider] Taking Active   ?Continuous Blood Gluc Receiver ([place]) DEVI [id number] Yes Use as directed to check blood sugar. Renato Shin, MD Taking Active   ?Continuous Blood Gluc Sensor (DEXCOM G6 SENSOR) MISC [id number] Yes Change every 10 days Renato Shin, MD Taking Active   ?Continuous Blood Gluc Transmit (DEXCOM G6 TRANSMITTER) MISC [id number] Yes Change every 3 months Renato Shin, MD Taking Active   ?dapagliflozin propanediol (FARXIGA) 10 MG TABS tablet [id number] Yes Take 10 mg by mouth daily. Claudia Desanctis, MD Taking Active   ?hydrochlorothiazide (MICROZIDE) 12.5 MG capsule [id number] Yes Take 1 capsule (12.5 mg total) by mouth daily. Baldwin [place], PA-C Taking Active   ?insulin lispro (HUMALOG KWIKPEN) 100 UNIT/ML KwikPen [id number] Yes 3 times a day (just before each meal) 8-14-6 units, and pen needles 3/day.  ?Patient taking differently: 3 times a day (just before each  meal) 8-14-6 units, and pen needles 3/day. - pt reports to using 12 units with lunch  ? Renato Shin, MD Taking Active   ?Insulin Pen Needle (PEN NEEDLES) 32G X 4 MM MISC [id number] Yes Use as Directed 3 times

## 2021-11-11 NOTE — Patient Instructions (Signed)
Visit Information ? ?Following are the goals we discussed today:  ? ?Manage My Medicine  ? ?Timeframe:  Long-Range Goal ?Priority:  High ?Start Date:    12/26/20                         ?Expected End Date:    11/2022             ? ?Follow Up Date: 07/2021 ?  ?- call for medicine refill 2 or 3 days before it runs out ?- call if I am sick and can't take my medicine ?- keep a list of all the medicines I take; vitamins and herbals too ?- use a pillbox to sort medicine  ? ?  ?Why is this important?   ?These steps will help you keep on track with your medicines. ? ?Plan: Telephone follow up appointment with care management team member scheduled for:  4-6 months ?The patient has been provided with contact information for the care management team and has been advised to call with any health related questions or concerns.  ? ?Ellin Saba, PharmD ?Clinical Pharmacist, [facility]  ? ? ?Please call the care guide team at [phone] if you need to cancel or reschedule your appointment.  ? ?Patient verbalizes understanding of instructions and care plan provided today and agrees to view in MyChart. Active MyChart status confirmed with patient.   ? ?

## 2021-11-11 NOTE — Progress Notes (Deleted)
Chronic Care Management Pharmacy Note  11/11/2021 Name:  Random Dobrowski MRN:  [id number] DOB:  September 13, 1950  Summary: -Has switched from humalog mix to humalog before each meal - reports BG remains elevated averaging around 220 (has follow up with endo at the end of the week) -Checking BP at home, reports reasonable control, averaging 130's/80's -Potassium dose decreased to 10 mEq daily per nephrology (started about 2 weeks ago) -Confirms adherence to all current medications  Recommendations/Changes made from today's visit: -Recommended for patient to have updated COVID booster, also patient to have shingrix vaccine series - agreeable to complete with next visit to pharmacy  -Patient to continue current medications, follow up with endo in 2 days regarding adjustments to medications for improved BG control  -Patient to continue monitoring BP 2-3 times weekly, make clinic aware should BP average >140/90   Subjective: Michael Snyder is an 71 y.o. year old male who is a primary patient of Hoyt Koch, MD.  The CCM team was consulted for assistance with disease management and care coordination needs.    Engaged with patient by telephone for follow up visit in response to provider referral for pharmacy case management and/or care coordination services.   Consent to Services:  The patient was given information about Chronic Care Management services, agreed to services, and gave verbal consent prior to initiation of services.  Please see initial visit note for detailed documentation.   Patient Care Team: Hoyt Koch, MD as PCP - General (Internal Medicine) Deboraha Sprang, MD as PCP - Electrophysiology (Cardiology) Tomasa Blase, [facility] (Pharmacist)   Recent office visits: 09/11/2021 - Dr. Sharlet Salina - no changes to medications - f/u with endocrinology   Recent consult visits: 10/03/2021 - Dr. Loanne Drilling - Endocrinology - increase humalog - 8-14-6 units with meals - f/u in 1 month   09/12/2021 - Dr. Loanne Drilling - Endocrinology - increase humalog 7-9-5 units  with meals  08/15/2021 - Dr. Loanne Drilling - endocrinology - increase humalog - 4-5-8 units with meals - f/u in 1 month  Hospital visits: None in previous 6 months 07/14/20 ED visit - [place] for constipation. Disimpaction at bedside. Rec'd Miralax and hydration   Objective:  Lab Results  Component Value Date   CREATININE 1.74 (H) 09/11/2021   BUN 18 09/11/2021   GFR 38.82 (L) 09/11/2021   GFRNONAA 45 (L) 07/14/2020   GFRAA 54 (L) 11/29/2018   NA 139 09/11/2021   K 3.3 (L) 09/11/2021   CALCIUM 9.3 09/11/2021   CO2 28 09/11/2021   GLUCOSE 199 (H) 09/11/2021    Lab Results  Component Value Date/Time   HGBA1C 12.8 (A) 09/12/2021 08:09 AM   HGBA1C 11.3 (A) 07/15/2021 08:24 AM   HGBA1C 12.5 (H) 03/13/2021 10:21 AM   HGBA1C 10.6 (H) 04/20/2019 08:55 AM   FRUCTOSAMINE 569 (H) 08/02/2020 08:15 AM   FRUCTOSAMINE 442 (H) 02/01/2020 08:14 AM   GFR 38.82 (L) 09/11/2021 10:31 AM   GFR 38.42 (L) 03/13/2021 10:21 AM   MICROALBUR 2.1 (H) 03/13/2021 10:21 AM   MICROALBUR 0.9 06/17/2020 03:40 PM    Last diabetic Eye exam:  Lab Results  Component Value Date/Time   HMDIABEYEEXA No Retinopathy 08/19/2021 02:22 PM    Last diabetic Foot exam: No results found for: HMDIABFOOTEX   Lab Results  Component Value Date   CHOL 119 03/13/2021   HDL 41.30 03/13/2021   LDLCALC 57 03/13/2021   LDLDIRECT 102.0 04/20/2019   TRIG 107.0 03/13/2021   CHOLHDL 3  03/13/2021       Latest Ref Rng & Units 09/11/2021   10:31 AM 03/13/2021   10:21 AM 07/14/2020    6:23 PM  Hepatic Function  Total Protein 6.0 - 8.3 g/dL 6.4   6.7   7.3    Albumin 3.5 - 5.2 g/dL 3.9   4.0   4.7    AST 0 - 37 U/L 40   34   39    ALT 0 - 53 U/L _0 Alk Phosphatase 39 - 117 U/L 78   77   72    Total Bilirubin 0.2 - 1.2 mg/dL 0.9   0.8   1.1      Lab Results  Component Value Date/Time   TSH 4.65 (H) 08/02/2020 08:11 AM   TSH 2.36  06/10/2017 03:09 PM       Latest Ref Rng & Units 09/11/2021   10:31 AM 03/13/2021   10:21 AM 07/14/2020    6:23 PM  CBC  WBC 4.0 - 10.5 K/uL 5.8   6.0   9.7    Hemoglobin 13.0 - 17.0 g/dL 12.6   12.6   13.5    Hematocrit 39.0 - 52.0 % 38.7   38.5   39.8    Platelets 150.0 - 400.0 K/uL 132.0   147.0   171      No results found for: VD25OH  Clinical ASCVD: No  The ASCVD Risk score (Arnett DK, et al., 2019) failed to calculate for the following reasons:   The valid total cholesterol range is 130 to 320 mg/dL       03/13/2021    9:08 AM 08/19/2020   10:26 AM 02/20/2020    8:51 AM  Depression screen PHQ 2/9  Decreased Interest 0 0 0  Down, Depressed, Hopeless 0 0 0  PHQ - 2 Score 0 0 0    CHADS2VASc score: 4  Social History   Tobacco Use  Smoking Status Never  Smokeless Tobacco Never   BP Readings from Last 3 Encounters:  10/03/21 (!) 144/92  09/11/21 124/78  08/15/21 (!) 132/56   Pulse Readings from Last 3 Encounters:  10/03/21 88  09/11/21 75  08/15/21 76   Wt Readings from Last 3 Encounters:  10/03/21 165 lb 12.8 oz (75.2 kg)  09/11/21 164 lb (74.4 kg)  08/15/21 161 lb (73 kg)   BMI Readings from Last 3 Encounters:  10/03/21 22.49 kg/m  09/11/21 22.24 kg/m  08/15/21 21.84 kg/m    Assessment/Interventions: Review of patient past medical history, allergies, medications, health status, including review of consultants reports, laboratory and other test data, was performed as part of comprehensive evaluation and provision of chronic care management services.   SDOH:  (Social Determinants of Health) assessments and interventions performed: Yes  SDOH Screenings   Alcohol Screen: Not on file  Depression (PHQ2-9): Low Risk    PHQ-2 Score: 0  Financial Resource Strain: Not on file  Food Insecurity: Not on file  Housing: Not on file  Physical Activity: Not on file  Social Connections: Not on file  Stress: Not on file  Tobacco Use: Low Risk    Smoking Tobacco  Use: Never   Smokeless Tobacco Use: Never   Passive Exposure: Not on file  Transportation Needs: Not on file    CCM Care Plan  Allergies  Allergen Reactions   Clindamycin/Lincomycin Swelling    Possible facial and lip swelling   Ace Inhibitors  Facial and lip swelling possibly associated    Medications Reviewed Today     Reviewed by Clydell Hakim, RD (Registered Dietitian) on 10/09/21 at [place] List Status: <None>   Medication Order Taking? Sig Documenting Provider Last Dose Status Informant  aspirin EC 81 MG tablet [id number] No Take 81 mg daily by mouth. [provider] Taking Active   atorvastatin (LIPITOR) 20 MG tablet [id number] No Take 1 tablet (20 mg total) by mouth daily. Hoyt Koch, MD Taking Active   Blood Pressure Monitoring (BLOOD PRESSURE KIT) DEVI [id number] No 1 kit by Does not apply route daily. Hoyt Koch, MD Taking Active   brimonidine [facility]) 0.2 % ophthalmic solution [id number] No SMARTSIG:In Eye(s) [provider] Taking Active   Calcium Carbonate-Vitamin D (CALTRATE 600+D) 600-400 MG-UNIT per tablet [id number] No Take 1 tablet by mouth 2 (two) times daily. Norins, Heinz Knuckles, MD Taking Active   Cholecalciferol (VITAMIN D) 50 MCG (2000 UT) tablet [id number] No Take 2,000 Units by mouth daily. [provider] Taking Active   Continuous Blood Gluc Receiver (DEXCOM G6 RECEIVER) DEVI [id number] No Use as directed to check blood sugar. Renato Shin, MD Taking Active   Continuous Blood Gluc Sensor (DEXCOM G6 SENSOR) MISC [id number] No Change every 10 days Renato Shin, MD Taking Active   Continuous Blood Gluc Transmit (DEXCOM G6 TRANSMITTER) MISC [id number] No Change every 3 months Renato Shin, MD Taking Active   dapagliflozin propanediol (FARXIGA) 10 MG TABS tablet [id number] No Take 10 mg by mouth daily. Claudia Desanctis, MD Taking Active   hydrochlorothiazide (MICROZIDE) 12.5 MG capsule [id number] No Take 1 capsule (12.5  mg total) by mouth daily. Baldwin [place], PA-C Taking Active   insulin lispro (HUMALOG KWIKPEN) 100 UNIT/ML KwikPen [id number]  3 times a day (just before each meal) 8-14-6 units, and pen needles 3/day. Renato Shin, MD  Active   Insulin Pen Needle (PEN NEEDLES) 32G X 4 MM MISC [id number] No Use as Directed Renato Shin, MD Taking Active   Lancets (ONETOUCH DELICA PLUS [id number]) [place] [id number] No CHECK BLOOD SUGAR TWICE  DAILY Hoyt Koch, MD Taking Active   latanoprost (XALATAN) 0.005 % ophthalmic solution [id number] No SMARTSIG:In Eye(s) [provider] Taking Active   levothyroxine (SYNTHROID) 25 MCG tablet [id number] No TAKE 1 TABLET BY MOUTH  DAILY BEFORE Paticia Stack, MD Taking Active   LINZESS 145 MCG CAPS capsule [id number] No TAKE 1 CAPSULE BY MOUTH  DAILY BEFORE BREAKFAST Hoyt Koch, MD Taking Active   metoprolol succinate (TOPROL XL) 25 MG 24 hr tablet [id number] No Take 1 tablet (25 mg total) by mouth daily. Shirley Friar, PA-C Taking Active   Multiple Vitamin (MULTIVITAMIN WITH MINERALS) TABS tablet [id number] No Take 1 tablet by mouth daily. [provider] Taking Active Self  Omega-3 Fatty Acids (FISH OIL) 300 MG CAPS [id number] No Take 1 capsule (300 mg total) by mouth daily. Norins, Heinz Knuckles, MD Taking Active Self  Roma Schanz test strip [id number] No TEST TWICE DAILY Hoyt Koch, MD Taking Active   pioglitazone (ACTOS) 30 MG tablet [id number] No TAKE 1 TABLET BY MOUTH  DAILY Renato Shin, MD Taking Active   polyethylene glycol powder (GLYCOLAX/MIRALAX) 17 GM/SCOOP powder [id number] No Take 17 g by mouth 2 (two) times daily as needed. Carlisle Cater, PA-C Taking Active   potassium chloride 20 MEQ TBCR [id number] No Take 20 mEq by mouth 2 (two) times daily.  Patient taking differently: Take 10 mEq  by mouth daily.   Annamaria Helling Taking Active   RYBELSUS 14 MG TABS [id number] No TAKE 1 TABLET BY MOUTH  DAILY Renato Shin, MD Taking Active   triamcinolone ointment (KENALOG) 0.5 % [id number] No APPLY TO AFFECTED AREA(S)  TOPICALLY TWICE DAILY Hoyt Koch, MD Taking Active             Patient Active Problem List   Diagnosis Date Noted   Stage 3b chronic kidney disease ([place]) 09/12/2021   Hypothyroidism 08/03/2020   Constipation 07/18/2020   De Quervain's tenosynovitis, left 03/14/2019   Left wrist pain 09/23/2018   Hepatitis C virus infection cured after antiviral drug therapy 12/24/2015   Cataract, right eye 11/13/2014   Cataract, left eye 62/70/3500   Complications, pacemaker cardiac, 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/2014   Atrial fibrillation ([place]) 11/17/2012   Cardiac pacemaker MDT 11/26/2011   Routine health maintenance 06/16/2011   Essential hypertension 06/01/2008   Diabetes mellitus type 2 with complications ([place]) 93/81/8299   SICK SINUS SYNDROME 03/26/2007   SEIZURE DISORDER 03/26/2007    Immunization History  Administered Date(s) Administered   Fluad Quad(high Dose 65+) 04/20/2019, 07/29/2020, 03/13/2021   Influenza Whole 03/30/2008, 04/16/2009, 06/05/2010   Influenza, High Dose Seasonal PF 06/02/2016, 04/05/2017, 03/29/2018   Influenza,inj,Quad PF,6+ Mos 05/16/2013, 03/08/2014, 08/21/2015   PFIZER(Purple Top)SARS-COV-2 Vaccination 10/27/2019, 11/17/2019, 05/28/2020, 10/24/2020   Pneumococcal Conjugate-13 11/01/2014   Pneumococcal Polysaccharide-23 09/13/2012, 12/30/2017   Td 03/30/2008   Tdap 05/02/2018   Zoster Recombinat (Shingrix) 12/30/2017   Zoster, Live 07/20/2014    Conditions to be addressed/monitored:  Hypertension, Hyperlipidemia, Diabetes, Atrial Fibrillation, and Chronic Kidney Disease  There are no care plans that you recently modified to display for this patient.    Medication Assistance: None required.  Patient affirms current coverage meets needs.  Care Gaps: Shingrix COVID booster   Patient's preferred pharmacy is:  [facility]  ([place]) - [place], [place] [facility] [address] [place] [address] [place] [address] Phone: [phone] Fax: [phone]  [place] [facility] [address] [place]), [place] - [address] [place] ([place]) [place] [address] Phone: [phone] Fax: [phone]  [place] [place] [address] Phone: [phone] Fax: [phone]  [facility] ([facility] ) - [place], [place] [address] Phone: [phone] Fax: [phone]   Uses pill box? Yes Pt endorses 100% compliance  Care Plan and Follow Up Patient Decision:  Patient agrees to Care Plan and Follow-up.  Plan: Telephone follow up appointment with care management team member scheduled for:  3 months  Tomasa Blase, PharmD Clinical Pharmacist, [place]

## 2021-11-13 ENCOUNTER — Ambulatory Visit: Payer: Medicare Other | Admitting: Endocrinology

## 2021-11-14 NOTE — Progress Notes (Signed)
Remote pacemaker transmission.   

## 2021-11-30 DIAGNOSIS — E119 Type 2 diabetes mellitus without complications: Secondary | ICD-10-CM | POA: Diagnosis not present

## 2021-12-03 DIAGNOSIS — I129 Hypertensive chronic kidney disease with stage 1 through stage 4 chronic kidney disease, or unspecified chronic kidney disease: Secondary | ICD-10-CM

## 2021-12-03 DIAGNOSIS — I4891 Unspecified atrial fibrillation: Secondary | ICD-10-CM | POA: Diagnosis not present

## 2021-12-03 DIAGNOSIS — E785 Hyperlipidemia, unspecified: Secondary | ICD-10-CM

## 2021-12-03 DIAGNOSIS — Z7984 Long term (current) use of oral hypoglycemic drugs: Secondary | ICD-10-CM

## 2021-12-03 DIAGNOSIS — E1122 Type 2 diabetes mellitus with diabetic chronic kidney disease: Secondary | ICD-10-CM | POA: Diagnosis not present

## 2021-12-03 DIAGNOSIS — Z794 Long term (current) use of insulin: Secondary | ICD-10-CM | POA: Diagnosis not present

## 2021-12-03 DIAGNOSIS — N1832 Chronic kidney disease, stage 3b: Secondary | ICD-10-CM

## 2021-12-15 ENCOUNTER — Other Ambulatory Visit: Payer: Self-pay | Admitting: Student

## 2021-12-15 DIAGNOSIS — E876 Hypokalemia: Secondary | ICD-10-CM

## 2021-12-17 ENCOUNTER — Other Ambulatory Visit: Payer: Self-pay | Admitting: Student

## 2021-12-17 ENCOUNTER — Ambulatory Visit: Payer: Medicare Other | Admitting: Endocrinology

## 2021-12-17 ENCOUNTER — Encounter: Payer: Self-pay | Admitting: Endocrinology

## 2021-12-17 VITALS — BP 150/96 | HR 72 | Ht 72.0 in | Wt 172.0 lb

## 2021-12-17 DIAGNOSIS — E118 Type 2 diabetes mellitus with unspecified complications: Secondary | ICD-10-CM

## 2021-12-17 DIAGNOSIS — E1165 Type 2 diabetes mellitus with hyperglycemia: Secondary | ICD-10-CM

## 2021-12-17 DIAGNOSIS — Z794 Long term (current) use of insulin: Secondary | ICD-10-CM

## 2021-12-17 DIAGNOSIS — E876 Hypokalemia: Secondary | ICD-10-CM

## 2021-12-17 LAB — POCT GLYCOSYLATED HEMOGLOBIN (HGB A1C): Hemoglobin A1C: 8.2 % — AB (ref 4.0–5.6)

## 2021-12-17 MED ORDER — TOUJEO SOLOSTAR 300 UNIT/ML ~~LOC~~ SOPN: 35.0000 [IU] | PEN_INJECTOR | Freq: Every day | SUBCUTANEOUS | 3 refills | Status: DC

## 2021-12-17 MED ORDER — OMNIPOD 5 DEXG7G6 INTRO GEN 5 KIT: 1.0000 | PACK | Freq: Once | 0 refills | Status: AC

## 2021-12-17 MED ORDER — OMNIPOD 5 DEXG7G6 PODS GEN 5 MISC: 1.0000 | 3 refills | Status: DC

## 2021-12-17 NOTE — Progress Notes (Signed)
Patient ID: Michael Snyder, male   DOB: 05/03/1951, 71 y.o.   MRN: [id number]           Reason for Appointment: Type II Diabetes follow-up   History of Present Illness    Diagnosis date: 2008  Previous history:  Oral hypoglycemic drugs previously used are: Glipizide, Actos, metformin Insulin was started in 2020 with Tresiba, subsequently Lantus and Humalog as well as 75/25  A1c range in the last few years is: 8.3-12.8  Recent history:     Non-insulin hypoglycemic drugs: Farxiga, Rybelsus and Actos     Insulin regimen: HUMALOG 8-12-6, 10-12 hs            Side effects from medications: None  Current self management, blood sugar patterns and problems identified:  A1c is 8.2 compared to 12.8 previously He has not been on any long-acting insulin since 2021 Although he has been seen by the nutritionist couple of times he is still having persistently high readings  Currently using the Dexcom with his average blood sugar being above target at all times eventually during the daytime  Also is having wide swings in his blood sugars at all different times  Although he thinks he is taking his Humalog before eating it may be sometimes taking it late causing very high readings and also continues to due to occasional hypoglycemia overnight when he takes correction doses as much as 12 units at bedtime  He is not adjusting his mealtime dose based on what he is eating and even with his largest meal at dinnertime is taking only 6 units of Humalog at that time  His dose of Humalog at breakfast appears to be inadequate also Continues to be on Farxiga but his GFR is only about 38   Diet management: Largest meal is at dinnertime       CGM use % of time 86  2-week average/GV 233/41  Time in range      35%  % Time Above 180 22  % Time above 250 41  % Time Below 70 <1     PRE-MEAL Fasting Lunch Dinner Bedtime Overall  Glucose range:       Averages: 166  284     POST-MEAL PC Breakfast PC Lunch  PC Dinner  Glucose range:     Averages:   286             Dietician visit: Most recent:  10/09/21    Weight control:  Wt Readings from Last 3 Encounters:  12/17/21 172 lb (78 kg)  10/03/21 165 lb 12.8 oz (75.2 kg)  09/11/21 164 lb (74.4 kg)            Diabetes labs:  Lab Results  Component Value Date   HGBA1C 8.2 (A) 12/17/2021   HGBA1C 12.8 (A) 09/12/2021   HGBA1C 11.3 (A) 07/15/2021   Lab Results  Component Value Date   MICROALBUR 2.1 (H) 03/13/2021   LDLCALC 57 03/13/2021   CREATININE 1.74 (H) 09/11/2021     Allergies as of 12/17/2021       Reactions   Clindamycin/lincomycin Swelling   Possible facial and lip swelling   Ace Inhibitors    Facial and lip swelling possibly associated        Medication List        Accurate as of December 17, 2021  3:06 PM. If you have any questions, ask your nurse or doctor.          aspirin EC 81 MG  tablet Take 81 mg daily by mouth.   atorvastatin 20 MG tablet Commonly known as: LIPITOR Take 1 tablet (20 mg total) by mouth daily.   Blood Pressure Kit Devi 1 kit by Does not apply route daily.   brimonidine 0.2 % ophthalmic solution Commonly known as: ALPHAGAN SMARTSIG:In Eye(s)   Calcium Carbonate-Vitamin D 600-400 MG-UNIT tablet Commonly known as: Caltrate 600+D Take 1 tablet by mouth 2 (two) times daily.   dapagliflozin propanediol 10 MG Tabs tablet Commonly known as: FARXIGA Take 10 mg by mouth daily.   Dexcom G6 Receiver Devi Use as directed to check blood sugar.   Dexcom G6 Sensor Misc Change every 10 days   Dexcom G6 Transmitter Misc Change every 3 months   Fish Oil 300 MG Caps Take 1 capsule (300 mg total) by mouth daily.   hydrochlorothiazide 12.5 MG capsule Commonly known as: MICROZIDE Take 1 capsule (12.5 mg total) by mouth daily.   insulin lispro 100 UNIT/ML KwikPen Commonly known as: HumaLOG KwikPen 3 times a day (just before each meal) 8-14-6 units, and pen needles 3/day. What changed:  additional instructions   latanoprost 0.005 % ophthalmic solution Commonly known as: XALATAN Place 1 drop into both eyes at bedtime.   levothyroxine 25 MCG tablet Commonly known as: SYNTHROID TAKE 1 TABLET BY MOUTH  DAILY BEFORE BREAKFAST   Linzess 145 MCG Caps capsule Generic drug: linaclotide TAKE 1 CAPSULE BY MOUTH  DAILY BEFORE BREAKFAST   metoprolol succinate 25 MG 24 hr tablet Commonly known as: TOPROL-XL TAKE 1 TABLET BY MOUTH  DAILY   multivitamin with minerals Tabs tablet Take 1 tablet by mouth daily.   OneTouch Delica Plus [id number] Misc CHECK BLOOD SUGAR TWICE  DAILY   OneTouch Verio test strip Generic drug: glucose blood TEST TWICE DAILY   Pen Needles 32G X 4 MM Misc Use as Directed 3 times daily to inject insulin   pioglitazone 30 MG tablet Commonly known as: ACTOS TAKE 1 TABLET BY MOUTH  DAILY   polyethylene glycol powder 17 GM/SCOOP powder Commonly known as: GLYCOLAX/MIRALAX Take 17 g by mouth 2 (two) times daily as needed.   potassium chloride SA 20 MEQ tablet Commonly known as: KLOR-CON M TAKE 1 TABLET BY MOUTH  TWICE DAILY What changed: how much to take   Rybelsus 14 MG Tabs Generic drug: Semaglutide TAKE 1 TABLET BY MOUTH DAILY   Toujeo SoloStar 300 UNIT/ML Solostar Pen Generic drug: insulin glargine (1 Unit Dial) Inject 35 Units into the skin daily. Started by: Elayne Snare, MD   triamcinolone ointment 0.5 % Commonly known as: KENALOG APPLY TO AFFECTED AREA(S)  TOPICALLY TWICE DAILY   Vitamin D 50 MCG (2000 UT) tablet Take 2,000 Units by mouth daily.        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   Pacemaker    Medtronic Adapta ADDR01   Seizure disorder ([place])    Seizures ([place])    last time in 1990's    Past Surgical  History:  Procedure Laterality Date   LACERATION REPAIR  1980   scalp   PACEMAKER LEAD REMOVAL N/A 03/23/2014   Procedure: PACEMAKER LEAD REMOVAL;  Surgeon: Evans Lance, MD;  Location: [facility];  Service: Cardiovascular;  Laterality: N/A;   PTDVP     7/08 medtronic DDD    Family History  Problem Relation Age of Onset   Hypertension Mother    COPD Mother    Hypertension Brother    Cancer Brother        leukemia   Hypertension Brother    Diabetes Neg Hx     Social History:  reports that he has never smoked. He has never used smokeless tobacco. He reports that he does not drink alcohol and does not use drugs.  Review of Systems:  Last diabetic eye exam date 2/23  Last foot exam date: 10/22  Symptoms of neuropathy:  Hypertension:   Treatment includes HCTZ Also followed by nephrologist  BP Readings from Last 3 Encounters:  12/17/21 (!) 150/96  11/11/21 120/74  10/03/21 (!) 144/92    Lipids:     Lab Results  Component Value Date   CHOL 119 03/13/2021   CHOL 187 04/20/2019   CHOL 154 05/02/2018   Lab Results  Component Value Date   HDL 41.30 03/13/2021   HDL 46.20 04/20/2019   HDL 50.40 05/02/2018   Lab Results  Component Value Date   LDLCALC 57 03/13/2021   LDLCALC 85 05/02/2018   LDLCALC 92 04/05/2017   Lab Results  Component Value Date   TRIG 107.0 03/13/2021   TRIG 209.0 (H) 04/20/2019   TRIG 91.0 05/02/2018   Lab Results  Component Value Date   CHOLHDL 3 03/13/2021   CHOLHDL 4 04/20/2019   CHOLHDL 3 05/02/2018   Lab Results  Component Value Date   LDLDIRECT 102.0 04/20/2019     Examination:   BP (!) 150/96   Pulse 72   Ht 6' (1.829 m)   Wt 172 lb (78 kg)   SpO2 98%   BMI 23.33 kg/m   Body mass index is 23.33 kg/m.    ASSESSMENT/ PLAN:     Diabetes type 2 on insulin:   Current regimen: Mealtime insulin and oral agents including Rybelsus, Farxiga and Actos  A1c is 8.2  Blood glucose control persistently poor with A1c consistently over 8% Currently is not taking any basal insulin which makes his control very difficult Also has inadequate insulin coverage for his breakfast and dinner meals and inappropriately high correction doses at bedtime causing low sugars overnight at times   Recommendations:  Take 10 units of Humalog before each meal No insulin to be taken at bedtime If the blood sugar is more than 200 add another 2 units of Humalog and if over 300 another 4 units If the sugar after any given meals days over 200 then increase the Humalog by 2 units for the next day  Add basal insulin Discussed how basal insulin works, timing of injection, dosage, injection sites.   Also discussed titration based on fasting blood sugar every 3 days by 2 units and at target of 90-130 for fasting reading.   Given a flowsheet with instructions on how to keep a record and adjust the doses Co-pay card given Toujeo insulin: This insulin provides blood sugar control for up to 24 hours.   Start with 12 units in the morning daily and increase by 2 units every 3 days until the waking up sugars are under  130. Then continue the same dose.  If blood sugar is under 90 for 2 days in a row, reduce the dose by 2 units.  Note that this insulin does not control the rise of blood sugar with meals    Consider insulin pump and will likely benefit if he is able to get the OmniPod 5 coverage Discussed in detail the use of the insulin pump, benefits especially closed-loop system and advantages compared to multiple injections including more accurate management of mealtime insulin  We will schedule follow-up with diabetes educator to review the changes in his management and help fine-tune his regimen  Patient Instructions  Take 10 units of Humalog before each  meal No insulin to be taken at bedtime If the blood sugar is more than 200 add another 2 units of Humalog and if over 300 another 4 units If the sugar after any given meals days over 200 then increase the Humalog by 2 units for the next day  Toujeo insulin: This insulin provides blood sugar control for up to 24 hours.   Start with 12 units in the morning daily and increase by 2 units every 3 days until the waking up sugars are under 130. Then continue the same dose.  If blood sugar is under 90 for 2 days in a row, reduce the dose by 2 units.  Note that this insulin does not control the rise of blood sugar with meals      Elayne Snare 12/17/2021, 3:06 PM

## 2021-12-17 NOTE — Patient Instructions (Signed)
Take 10 units of Humalog before each meal No insulin to be taken at bedtime If the blood sugar is more than 200 add another 2 units of Humalog and if over 300 another 4 units If the sugar after any given meals days over 200 then increase the Humalog by 2 units for the next day  Toujeo insulin: This insulin provides blood sugar control for up to 24 hours.   Start with 12 units in the morning daily and increase by 2 units every 3 days until the waking up sugars are under 130. Then continue the same dose.  If blood sugar is under 90 for 2 days in a row, reduce the dose by 2 units.  Note that this insulin does not control the rise of blood sugar with meals

## 2021-12-18 DIAGNOSIS — H401232 Low-tension glaucoma, bilateral, moderate stage: Secondary | ICD-10-CM | POA: Diagnosis not present

## 2021-12-22 ENCOUNTER — Ambulatory Visit: Payer: Medicare Other | Admitting: Dietician

## 2021-12-25 ENCOUNTER — Other Ambulatory Visit: Payer: Self-pay

## 2021-12-25 DIAGNOSIS — E119 Type 2 diabetes mellitus without complications: Secondary | ICD-10-CM

## 2021-12-25 MED ORDER — DEXCOM G6 RECEIVER DEVI: 0 refills | Status: DC

## 2021-12-26 ENCOUNTER — Telehealth: Payer: Self-pay

## 2021-12-30 ENCOUNTER — Encounter: Payer: Self-pay | Admitting: Endocrinology

## 2021-12-30 DIAGNOSIS — E119 Type 2 diabetes mellitus without complications: Secondary | ICD-10-CM | POA: Diagnosis not present

## 2022-01-26 DIAGNOSIS — N1832 Chronic kidney disease, stage 3b: Secondary | ICD-10-CM | POA: Diagnosis not present

## 2022-01-28 ENCOUNTER — Telehealth: Payer: Self-pay

## 2022-01-28 NOTE — Telephone Encounter (Signed)
Patient has new omnipod and needs appointment to help get it set up.

## 2022-02-02 ENCOUNTER — Other Ambulatory Visit: Payer: Self-pay | Admitting: Endocrinology

## 2022-02-02 DIAGNOSIS — E1165 Type 2 diabetes mellitus with hyperglycemia: Secondary | ICD-10-CM

## 2022-02-03 ENCOUNTER — Telehealth: Payer: Self-pay | Admitting: Nutrition

## 2022-02-03 DIAGNOSIS — I129 Hypertensive chronic kidney disease with stage 1 through stage 4 chronic kidney disease, or unspecified chronic kidney disease: Secondary | ICD-10-CM | POA: Diagnosis not present

## 2022-02-03 DIAGNOSIS — N281 Cyst of kidney, acquired: Secondary | ICD-10-CM | POA: Diagnosis not present

## 2022-02-03 DIAGNOSIS — N2581 Secondary hyperparathyroidism of renal origin: Secondary | ICD-10-CM | POA: Diagnosis not present

## 2022-02-03 DIAGNOSIS — E1122 Type 2 diabetes mellitus with diabetic chronic kidney disease: Secondary | ICD-10-CM | POA: Diagnosis not present

## 2022-02-03 DIAGNOSIS — N200 Calculus of kidney: Secondary | ICD-10-CM | POA: Diagnosis not present

## 2022-02-03 DIAGNOSIS — N1832 Chronic kidney disease, stage 3b: Secondary | ICD-10-CM | POA: Diagnosis not present

## 2022-02-03 DIAGNOSIS — I495 Sick sinus syndrome: Secondary | ICD-10-CM | POA: Diagnosis not present

## 2022-02-03 DIAGNOSIS — E876 Hypokalemia: Secondary | ICD-10-CM | POA: Diagnosis not present

## 2022-02-06 DIAGNOSIS — E119 Type 2 diabetes mellitus without complications: Secondary | ICD-10-CM | POA: Diagnosis not present

## 2022-02-09 ENCOUNTER — Encounter: Payer: Medicare Other | Attending: Endocrinology | Admitting: Nutrition

## 2022-02-09 DIAGNOSIS — E118 Type 2 diabetes mellitus with unspecified complications: Secondary | ICD-10-CM | POA: Insufficient documentation

## 2022-02-10 NOTE — Progress Notes (Signed)
Patient is here as precursor to OmniPod 5 pump start.  He is wearing a Dexcom, but this is linked to a reader.  The Dexcom app was downloaded to his phone and this was linked to the current sensor.  He was shown how to use this App, and how to put the transmitter number and sensor code into the app before starting the sensor.  He reported good understanding of this.  The app was also linked to [place] [facility].  Pump start is scheduled for next Monday.  He had no final questions.

## 2022-02-10 NOTE — Telephone Encounter (Signed)
Patient is scheduled for next week.  His dexcom was linked to his phone and to [place] [facility] yesterday, as he was using a reader

## 2022-02-10 NOTE — Patient Instructions (Signed)
Change sensor every 10 days on app on your phone Change transmitter every 3 months, putting transmitter number into your phone.  Call Dexcom if questions.

## 2022-02-13 ENCOUNTER — Other Ambulatory Visit: Payer: Self-pay

## 2022-02-13 ENCOUNTER — Ambulatory Visit: Payer: Self-pay | Admitting: Licensed Clinical Social Worker

## 2022-02-13 ENCOUNTER — Other Ambulatory Visit: Payer: Self-pay | Admitting: Internal Medicine

## 2022-02-13 DIAGNOSIS — E119 Type 2 diabetes mellitus without complications: Secondary | ICD-10-CM

## 2022-02-13 MED ORDER — PEN NEEDLES 32G X 4 MM MISC: 5 refills | Status: DC

## 2022-02-13 MED ORDER — TOUJEO SOLOSTAR 300 UNIT/ML ~~LOC~~ SOPN: 35.0000 [IU] | PEN_INJECTOR | Freq: Every day | SUBCUTANEOUS | 3 refills | Status: DC

## 2022-02-13 MED ORDER — DEXCOM G6 SENSOR MISC: 3 refills | Status: DC

## 2022-02-13 MED ORDER — DEXCOM G6 TRANSMITTER MISC: 3 refills | Status: DC

## 2022-02-13 NOTE — Patient Instructions (Addendum)
Visit Information  Thank you for taking time to visit with me today. Please don't hesitate to contact me if I can be of assistance to you before our next scheduled telephone appointment.  Following are the goals we discussed today:   No further intervention is needed at this time. Client sees endocrinologist for Diabetes management . Client has appointment with endocrinologist next Monday  Please call the care guide team at [phone] if you need to cancel or reschedule your appointment.   If you are experiencing a Mental Health or Behavioral Health Crisis or need someone to talk to, please go to [facility] [address] [place] [phone])   Following is a copy of your full plan of care:   Care Coordination Interventions:  Depression screen reviewed  PHQ2/PHQ9 completed Active listening / Reflection utilized  Emotional Support Provided  Discussed Care Coordination program support with client Reviewed client support with endocrinologist. He said he as an appointment with endocrinologist on next Monday.   Discussed Care Coordination roles of RN, Pharmacist and LCSW Provided counseling support for client  [name] was given information about Care Management services by the embedded care coordination team including:  Care Management services include personalized support from designated clinical staff supervised by his physician, including individualized plan of care and coordination with other care providers 24/7 contact phone numbers for assistance for urgent and routine care needs. The patient may stop CCM services at any time (effective at the end of the month) by phone call to the office staff.  Patient agreed to services and verbal consent obtained.   Michael Snyder MSW, LCSW Licensed Visual merchandiser [facility] Care Management [phone]

## 2022-02-13 NOTE — Patient Outreach (Signed)
  Care Coordination   Initial Visit Note   02/13/2022 Name: Michael Snyder MRN: [id number] DOB: [birth year]  Michael Snyder is a 71 y.o. year old male who sees Michael Broker, MD for primary care. I spoke with  Michael Snyder by phone today  What matters to the patients health and wellness today?  Client wants assistance in learning how to manage Diabetes    Goals Addressed               This Visit's Progress     patient wants assistance in learning how to manage Diabetes. (pt-stated)        Care Coordination Interventions:  Depression screen reviewed  PHQ2/PHQ9 completed Active listening / Reflection utilized  Emotional Support Provided  Discussed Care Coordination program support with client Reviewed client support with endocrinologist. He said he as an appointment with endocrinologist on next Monday.   Discussed Care Coordination roles of RN, Pharmacist and LCSW Provided counseling support for client    SDOH assessments and interventions completed:  Yes  SDOH Interventions Today    Flowsheet Row Most Recent Value  SDOH Interventions   Stress Interventions Provide Counseling  [client has stress related to managing Diabetes]  Depression Interventions/Treatment  Counseling        Care Coordination Interventions Activated:  Yes  Care Coordination Interventions:  Yes, provided   Follow up plan: No further intervention required.   Encounter Outcome:  Pt. Visit Completed

## 2022-02-16 ENCOUNTER — Encounter: Payer: Medicare Other | Admitting: Nutrition

## 2022-02-16 ENCOUNTER — Telehealth: Payer: Self-pay | Admitting: Nutrition

## 2022-02-16 DIAGNOSIS — E118 Type 2 diabetes mellitus with unspecified complications: Secondary | ICD-10-CM

## 2022-02-16 NOTE — Telephone Encounter (Signed)
Please order Michael Snyder's Humalog in vials.  He started his pump this week.  He is using 50u/day.  12u ac and basal insulin.  Thank you

## 2022-02-16 NOTE — Patient Instructions (Addendum)
STOP toujeo insulin Read over pump training manual and starter booklet Call OmniPod help line if questions on how to use the pump Call office if blood sugars drop low or remain over 200. Call office if Dexcom readings do not show up on PDM.

## 2022-02-16 NOTE — Progress Notes (Signed)
[name] was trained on the use of the OmniPod 5 pump.  Settings were put in per Dr. Remus Blake orders:  basal rate: 0.3u/hr, I/C: 1, with written instructions for 8u for smaller meals, 10u for medium meals and 12u for larger meals, target 120 with correction over 120, ISF: 50 timing 4 hours.  The PDM was linked to [facility].   He was shown how to fill a pod, and filled one using Humalog pens that were ordered for him.  He was also shown how to use a syringe, but will do this when he runs out of the Humalog pens.  He filled a pod and with 150u of Humalog and attached the pod to his left upper/outer arm, and we discussed where he can use this with reference to his Dexcom sensor.  He reported good understanding of this.  He was shown how to bolus and re demonstrated this x3 correctly.  His dexcom never linked to his phone after leaving here last week.  We reinserted the transmitter number, and it linked to his phone.  The timer was set to 2 hours, so PDM did not get the readings as yet.  He was told to call here if reader dose not get the blood sugar readings from his phone after the 2 hour warm up period is completed.  He agreed to do this.    He reported that he could no learn any more today, so another appointment was set for Wednesday morning to finish the training.  We reviewed how to respond to alerts and alarms and he had no final questions. Note to Alycia  to order Humalog in a vial for him.

## 2022-02-17 MED ORDER — INSULIN LISPRO 100 UNIT/ML IJ SOLN: INTRAMUSCULAR | 2 refills | Status: DC

## 2022-02-17 NOTE — Telephone Encounter (Signed)
Insulin was sent to [facility]

## 2022-02-18 ENCOUNTER — Encounter: Payer: Medicare Other | Admitting: Nutrition

## 2022-02-18 ENCOUNTER — Telehealth: Payer: Self-pay | Admitting: Nutrition

## 2022-02-18 DIAGNOSIS — E118 Type 2 diabetes mellitus with unspecified complications: Secondary | ICD-10-CM | POA: Diagnosis not present

## 2022-02-18 DIAGNOSIS — I10 Essential (primary) hypertension: Secondary | ICD-10-CM | POA: Diagnosis not present

## 2022-02-18 DIAGNOSIS — M25511 Pain in right shoulder: Secondary | ICD-10-CM | POA: Diagnosis not present

## 2022-02-18 NOTE — Patient Instructions (Signed)
Read over starter booklet and pump manuel Call help line if questions about how to use the pump Call office if blood sugars drop low or remain over 225.

## 2022-02-18 NOTE — Progress Notes (Signed)
We reviewed how to bolus, and he appears to be doing this correctly, by putting in insulin dose and blood sugar readings. We reviewed how to give a correction dose, alerts, alarms, high blood sugar protocol and starting/stopping the pump.  He reported good understanding of all topics and signed the training checklist.  He had no questions for me at this time

## 2022-02-18 NOTE — Telephone Encounter (Signed)
Patient reports that Dr. Everardo All writes him a prescription for Triamcinolone acetonide ointment to keep his feet soft. He is almost out and would like this renewed.  Please send to [facility] on [address]

## 2022-02-19 ENCOUNTER — Other Ambulatory Visit: Payer: Self-pay | Admitting: Physician Assistant

## 2022-02-19 DIAGNOSIS — E876 Hypokalemia: Secondary | ICD-10-CM

## 2022-02-23 ENCOUNTER — Ambulatory Visit (INDEPENDENT_AMBULATORY_CARE_PROVIDER_SITE_OTHER): Payer: Medicare Other

## 2022-02-23 DIAGNOSIS — I495 Sick sinus syndrome: Secondary | ICD-10-CM | POA: Diagnosis not present

## 2022-02-24 ENCOUNTER — Ambulatory Visit (INDEPENDENT_AMBULATORY_CARE_PROVIDER_SITE_OTHER): Payer: Medicare Other | Admitting: Internal Medicine

## 2022-02-24 ENCOUNTER — Encounter: Payer: Self-pay | Admitting: Internal Medicine

## 2022-02-24 VITALS — BP 138/84 | HR 76 | Temp 97.9°F | Ht 72.0 in | Wt 170.0 lb

## 2022-02-24 DIAGNOSIS — M25511 Pain in right shoulder: Secondary | ICD-10-CM | POA: Diagnosis not present

## 2022-02-24 DIAGNOSIS — G8929 Other chronic pain: Secondary | ICD-10-CM | POA: Insufficient documentation

## 2022-02-24 MED ORDER — TRAMADOL HCL 50 MG PO TABS: 50.0000 mg | ORAL_TABLET | Freq: Four times a day (QID) | ORAL | 0 refills | Status: AC | PRN

## 2022-02-24 NOTE — Progress Notes (Signed)
   Subjective:   Patient ID: Michael Snyder, male    DOB: 1950-10-27, 71 y.o.   MRN: [id number]  Shoulder Pain    The patient is a 71 YO man coming in for pain to shoulder while moving furniture in the last week or so. Moderate to severe.   Review of Systems  Constitutional: Negative.   HENT: Negative.    Eyes: Negative.   Respiratory:  Negative for cough, chest tightness and shortness of breath.   Cardiovascular:  Negative for chest pain, palpitations and leg swelling.  Gastrointestinal:  Negative for abdominal distention, abdominal pain, constipation, diarrhea, nausea and vomiting.  Musculoskeletal:  Positive for arthralgias and myalgias.  Skin: Negative.   Neurological: Negative.   Psychiatric/Behavioral: Negative.      Objective:  Physical Exam Constitutional:      Appearance: He is well-developed.  HENT:     Head: Normocephalic and atraumatic.  Cardiovascular:     Rate and Rhythm: Normal rate and regular rhythm.  Pulmonary:     Effort: Pulmonary effort is normal. No respiratory distress.     Breath sounds: Normal breath sounds. No wheezing or rales.  Abdominal:     General: Bowel sounds are normal. There is no distension.     Palpations: Abdomen is soft.     Tenderness: There is no abdominal tenderness. There is no rebound.  Musculoskeletal:        General: Tenderness present.     Cervical back: Normal range of motion.     Comments: Pain in shoulder and AC region, in sling, ROM limited significantly due to pain  Skin:    General: Skin is warm and dry.  Neurological:     Mental Status: He is alert and oriented to person, place, and time.     Coordination: Coordination normal.     Vitals:   02/24/22 1031  BP: 138/84  Pulse: 76  Temp: 97.9 F (36.6 C)  TempSrc: Oral  SpO2: 92%  Weight: 170 lb (77.1 kg)  Height: 6' (1.829 m)    Assessment & Plan:

## 2022-02-24 NOTE — Assessment & Plan Note (Signed)
Referral to sports medicine. Given significant lack of ROM due to pain could be rotator cuff injury. Rx tramadol for pain relief 50 mg QID prn. He was given meloxicam by urgent care however with his CKD stage 3b he should not take this and was advised to use tylenol only in addition to the tramadol.

## 2022-02-24 NOTE — Patient Instructions (Signed)
We have sent in tramadol to use for pain.  We will get you in with sports medicine to help [place] heal the shoulder faster.

## 2022-02-25 LAB — CUP PACEART REMOTE DEVICE CHECK
Battery Impedance: 551 Ohm
Battery Remaining Longevity: 95 mo
Battery Voltage: 2.79 V
Brady Statistic AP VP Percent: 0 %
Brady Statistic AP VS Percent: 11 %
Brady Statistic AS VP Percent: 0 %
Brady Statistic AS VS Percent: 89 %
Date Time Interrogation Session: 20230820155815
Implantable Lead Implant Date: 20080711
Implantable Lead Implant Date: 20150918
Implantable Lead Location: 753859
Implantable Lead Location: 753860
Implantable Lead Model: 5076
Implantable Lead Model: 5076
Implantable Pulse Generator Implant Date: 20150918
Lead Channel Impedance Value: 459 Ohm
Lead Channel Impedance Value: 524 Ohm
Lead Channel Pacing Threshold Amplitude: 0.75 V
Lead Channel Pacing Threshold Amplitude: 0.75 V
Lead Channel Pacing Threshold Pulse Width: 0.4 ms
Lead Channel Pacing Threshold Pulse Width: 0.4 ms
Lead Channel Setting Pacing Amplitude: 2 V
Lead Channel Setting Pacing Amplitude: 2.5 V
Lead Channel Setting Pacing Pulse Width: 0.4 ms
Lead Channel Setting Sensing Sensitivity: 2 mV

## 2022-02-27 ENCOUNTER — Other Ambulatory Visit (INDEPENDENT_AMBULATORY_CARE_PROVIDER_SITE_OTHER): Payer: Medicare Other

## 2022-02-27 ENCOUNTER — Ambulatory Visit (INDEPENDENT_AMBULATORY_CARE_PROVIDER_SITE_OTHER): Payer: Medicare Other

## 2022-02-27 ENCOUNTER — Ambulatory Visit: Payer: Medicare Other | Admitting: Sports Medicine

## 2022-02-27 ENCOUNTER — Other Ambulatory Visit: Payer: Self-pay | Admitting: Endocrinology

## 2022-02-27 VITALS — BP 132/82 | HR 62 | Ht 72.0 in | Wt 170.0 lb

## 2022-02-27 DIAGNOSIS — M25511 Pain in right shoulder: Secondary | ICD-10-CM | POA: Diagnosis not present

## 2022-02-27 DIAGNOSIS — E1165 Type 2 diabetes mellitus with hyperglycemia: Secondary | ICD-10-CM

## 2022-02-27 DIAGNOSIS — S46011A Strain of muscle(s) and tendon(s) of the rotator cuff of right shoulder, initial encounter: Secondary | ICD-10-CM

## 2022-02-27 DIAGNOSIS — Z794 Long term (current) use of insulin: Secondary | ICD-10-CM | POA: Diagnosis not present

## 2022-02-27 DIAGNOSIS — E039 Hypothyroidism, unspecified: Secondary | ICD-10-CM | POA: Diagnosis not present

## 2022-02-27 DIAGNOSIS — G8929 Other chronic pain: Secondary | ICD-10-CM

## 2022-02-27 LAB — BASIC METABOLIC PANEL
BUN: 18 mg/dL (ref 6–23)
CO2: 29 mEq/L (ref 19–32)
Calcium: 9 mg/dL (ref 8.4–10.5)
Chloride: 105 mEq/L (ref 96–112)
Creatinine, Ser: 1.93 mg/dL — ABNORMAL HIGH (ref 0.40–1.50)
GFR: 34.17 mL/min — ABNORMAL LOW (ref >60.00)
Glucose, Bld: 134 mg/dL — ABNORMAL HIGH (ref 70–99)
Potassium: 4 mEq/L (ref 3.5–5.1)
Sodium: 141 mEq/L (ref 135–145)

## 2022-02-27 LAB — MICROALBUMIN / CREATININE URINE RATIO
Creatinine,U: 96.6 mg/dL
Microalb Creat Ratio: 4.9 mg/g (ref 0.0–30.0)
Microalb, Ur: 4.8 mg/dL — ABNORMAL HIGH (ref 0.0–1.9)

## 2022-02-27 LAB — TSH: TSH: 2.65 u[IU]/mL (ref 0.35–5.50)

## 2022-02-27 LAB — LIPID PANEL
Cholesterol: 113 mg/dL (ref 0–200)
HDL: 49.4 mg/dL (ref >39.00)
LDL Cholesterol: 50 mg/dL (ref 0–99)
NonHDL: 63.37
Total CHOL/HDL Ratio: 2
Triglycerides: 68 mg/dL (ref 0.0–149.0)
VLDL: 13.6 mg/dL (ref 0.0–40.0)

## 2022-02-27 LAB — HEMOGLOBIN A1C: Hgb A1c MFr Bld: 8.1 % — ABNORMAL HIGH (ref 4.6–6.5)

## 2022-02-27 MED ORDER — MELOXICAM 15 MG PO TABS: 15.0000 mg | ORAL_TABLET | Freq: Every day | ORAL | 0 refills | Status: DC

## 2022-02-27 NOTE — Patient Instructions (Addendum)
Good to see you  - Start meloxicam 15 mg daily x2 weeks.  If still having pain after 2 weeks. Do not to use additional NSAIDs while taking meloxicam.  May use Tylenol [phone] mg 2 to 3 times a day for breakthrough pain. Shoulder HEP Pt referral  3-4 week follow up

## 2022-02-27 NOTE — Progress Notes (Signed)
Michael Snyder D.[facility] [address] [place] [address] Phone: [phone]   Assessment and Plan:     1. Chronic right shoulder pain 2. Rotator cuff strain, right, initial encounter -Chronic with exacerbation, initial sports medicine visit - Acutely worsening right shoulder pain after incident with patient moving furniture with history of intermittent shoulder pain - Consistent with rotator cuff pathology.  I do not think the patient has a complete tear of any rotator cuff muscle based on maintained strength, however he may have large degree of partial tears versus grade 1 strain - Offered subacromial CSI, however patient stated that prior CSI significantly raise his blood glucose with history of DM type II, so patient wishes to avoid CSI - Start meloxicam 15 mg daily x2 weeks.  If still having pain after 2 weeks, Do not to use additional NSAIDs while taking meloxicam.  May use Tylenol [phone] mg 2 to 3 times a day for breakthrough pain.  We will only use meloxicam for a short 2-week course and no longer due to patient's comorbidities - Start HEP and physical therapy for shoulder and rotator cuff - Ambulatory referral to Physical Therapy -X-ray obtained in clinic.  My interpretation: No acute fracture or dislocation.  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    Pertinent previous records reviewed include none   Follow Up: 3 to 4 weeks for reevaluation.  Could consider ultrasound to evaluate for rotator cuff tear   Subjective:   I, Michael Snyder, am serving as a Neurosurgeon for Doctor Richardean Sale  Chief Complaint: right shoulder pain   HPI:   02/27/22 Patient is a 71 year old male complaining of right shoulder pain. Patient states pain to shoulder while moving furniture in the last week or so. Moderate to severe. no numbness or  tingling,no locking clicking or popping  Given significant lack of ROM due to pain could be rotator cuff injury. Rx tramadol for pain relief 50 mg QID prn. He was given meloxicam by urgent care however with his CKD stage 3b he should not take this and was advised to use tylenol only in addition to the tramadol. hasn't started the tramadol yet    Relevant Historical Information: DM type II, CKD, hypertension  Additional pertinent review of systems negative.   Current Outpatient Medications:    meloxicam (MOBIC) 15 MG tablet, Take 1 tablet (15 mg total) by mouth daily., Disp: 14 tablet, Rfl: 0   aspirin EC 81 MG tablet, Take 81 mg daily by mouth., Disp: , Rfl:    atorvastatin (LIPITOR) 20 MG tablet, Take 1 tablet (20 mg total) by mouth daily., Disp: 90 tablet, Rfl: 3   Blood Pressure Monitoring (BLOOD PRESSURE KIT) DEVI, 1 kit by Does not apply route daily., Disp: 1 each, Rfl: 0   brimonidine (ALPHAGAN) 0.2 % ophthalmic solution, SMARTSIG:In Eye(s), Disp: , Rfl:    Calcium Carbonate-Vitamin D (CALTRATE 600+D) 600-400 MG-UNIT per tablet, Take 1 tablet by mouth 2 (two) times daily., Disp: 180 tablet, Rfl: 3   Cholecalciferol (VITAMIN D) 50 MCG (2000 UT) tablet, Take 2,000 Units by mouth daily., Disp: , Rfl:    Continuous Blood Gluc Receiver (DEXCOM G6 RECEIVER) DEVI, Use as directed to check blood sugar., Disp: 1 each, Rfl: 0   Continuous Blood Gluc Sensor (DEXCOM G6 SENSOR) MISC, Change every 10 days, Disp: 9  each, Rfl: 3   Continuous Blood Gluc Transmit (DEXCOM G6 TRANSMITTER) MISC, Change every 3 months, Disp: 1 each, Rfl: 3   dapagliflozin propanediol (FARXIGA) 10 MG TABS tablet, Take 10 mg by mouth daily., Disp: , Rfl:    hydrochlorothiazide (MICROZIDE) 12.5 MG capsule, Take 1 capsule (12.5 mg total) by mouth daily., Disp: 90 capsule, Rfl: 3   Insulin Disposable Pump (OMNIPOD 5 G6 POD, GEN 5,) MISC, 1 Device by Does not apply route every 3 (three) days., Disp: 2 each, Rfl: 3   insulin  glargine, 1 Unit Dial, (TOUJEO SOLOSTAR) 300 UNIT/ML Solostar Pen, Inject 35 Units into the skin daily., Disp: 6 mL, Rfl: 3   insulin lispro (HUMALOG) 100 UNIT/ML injection, Inject 50 units in pump daily, Disp: 20 mL, Rfl: 2   Insulin Pen Needle (PEN NEEDLES) 32G X 4 MM MISC, Use as Directed 3 times daily to inject insulin, Disp: 100 each, Rfl: 5   Lancets (ONETOUCH DELICA PLUS [id number]) MISC, CHECK BLOOD SUGAR TWICE  DAILY, Disp: 200 each, Rfl: 2   latanoprost (XALATAN) 0.005 % ophthalmic solution, Place 1 drop into both eyes at bedtime., Disp: , Rfl:    levothyroxine (SYNTHROID) 25 MCG tablet, TAKE 1 TABLET BY MOUTH  DAILY BEFORE BREAKFAST, Disp: 90 tablet, Rfl: 3   LINZESS 145 MCG CAPS capsule, TAKE 1 CAPSULE BY MOUTH DAILY  BEFORE BREAKFAST, Disp: 90 capsule, Rfl: 3   meloxicam (MOBIC) 7.5 MG tablet, Take 7.5 mg by mouth daily., Disp: , Rfl:    metoprolol succinate (TOPROL-XL) 25 MG 24 hr tablet, TAKE 1 TABLET BY MOUTH ONCE  DAILY, Disp: 90 tablet, Rfl: 0   Multiple Vitamin (MULTIVITAMIN WITH MINERALS) TABS tablet, Take 1 tablet by mouth daily., Disp: , Rfl:    Omega-3 Fatty Acids (FISH OIL) 300 MG CAPS, Take 1 capsule (300 mg total) by mouth daily., Disp: 90 capsule, Rfl: 3   ONETOUCH VERIO test strip, TEST TWICE DAILY (Patient not taking: Reported on 12/17/2021), Disp: 200 strip, Rfl: 3   pioglitazone (ACTOS) 30 MG tablet, TAKE 1 TABLET BY MOUTH  DAILY, Disp: 90 tablet, Rfl: 3   polyethylene glycol powder (GLYCOLAX/MIRALAX) 17 GM/SCOOP powder, Take 17 g by mouth 2 (two) times daily as needed., Disp: 255 g, Rfl: 0   potassium chloride SA (KLOR-CON M) 20 MEQ tablet, TAKE 1 TABLET BY MOUTH  TWICE DAILY (Patient taking differently: Take 10 mEq by mouth 2 (two) times daily.), Disp: 180 tablet, Rfl: 2   RYBELSUS 14 MG TABS, TAKE 1 TABLET BY MOUTH DAILY, Disp: 90 tablet, Rfl: 3   traMADol (ULTRAM) 50 MG tablet, Take 1 tablet (50 mg total) by mouth every 6 (six) hours as needed for up to 5 days., Disp:  20 tablet, Rfl: 0   triamcinolone ointment (KENALOG) 0.5 %, APPLY TO AFFECTED AREA(S)  TOPICALLY TWICE DAILY, Disp: 90 g, Rfl: 0   Objective:     Vitals:   02/27/22 1019  BP: 132/82  Pulse: 62  SpO2: 100%  Weight: 170 lb (77.1 kg)  Height: 6' (1.829 m)      Body mass index is 23.06 kg/m.    Physical Exam:    Gen: Appears well, nad, nontoxic and pleasant Neuro:sensation intact, strength is 5/5 with df/pf/inv/ev, muscle tone wnl Skin: no suspicious lesion or defmority Psych: A&O, appropriate mood and affect  Right shoulder: no deformity, swelling or muscle wasting No scapular winging FF 110 with painful arc, abd 110 with painful arc, int 20, ext 70 TTP  anterior shoulder musculature NTTP over the [place], clavicle, ac, coracoid, biceps groove, humerus, deltoid, trapezius, cervical spine Equivocal physical exam testing due to patient's pain and reduced ROM which included neer, hawkings, empty can, subscap liftoff, speeds, obriens, crossarm Neg ant drawer, sulcus sign, apprehension Negative Spurling's test bilat FROM of neck    Electronically signed by:  Benito Mccreedy D.[facility] 10:53 AM 02/27/22

## 2022-03-04 ENCOUNTER — Ambulatory Visit: Payer: Medicare Other | Admitting: Endocrinology

## 2022-03-04 ENCOUNTER — Other Ambulatory Visit: Payer: Self-pay

## 2022-03-04 ENCOUNTER — Encounter: Payer: Self-pay | Admitting: Endocrinology

## 2022-03-04 VITALS — BP 112/78 | HR 84 | Ht 72.0 in | Wt 179.6 lb

## 2022-03-04 DIAGNOSIS — E7800 Pure hypercholesterolemia, unspecified: Secondary | ICD-10-CM

## 2022-03-04 DIAGNOSIS — E039 Hypothyroidism, unspecified: Secondary | ICD-10-CM | POA: Diagnosis not present

## 2022-03-04 DIAGNOSIS — E1165 Type 2 diabetes mellitus with hyperglycemia: Secondary | ICD-10-CM

## 2022-03-04 DIAGNOSIS — Z794 Long term (current) use of insulin: Secondary | ICD-10-CM

## 2022-03-04 MED ORDER — LEVOTHYROXINE SODIUM 25 MCG PO TABS: 25.0000 ug | ORAL_TABLET | Freq: Every day | ORAL | 3 refills | Status: DC

## 2022-03-04 MED ORDER — PIOGLITAZONE HCL 30 MG PO TABS: 30.0000 mg | ORAL_TABLET | Freq: Every day | ORAL | 3 refills | Status: DC

## 2022-03-04 NOTE — Progress Notes (Signed)
Patient ID: Michael Snyder, male   DOB: Oct 31, 1950, 71 y.o.   MRN: [id number]           Reason for Appointment: Type II Diabetes follow-up   History of Present Illness    Diagnosis date: 2008  Previous history:  Oral hypoglycemic drugs previously used are: Glipizide, Actos, metformin Insulin was started in 2020 with Tresiba, subsequently Lantus and Humalog as well as 75/25  A1c range in the last few years is: 8.3-12.8  Recent history:     OMNIPOD insulin pump settings:  Basal rate = 0.3 units an hour all 24 hours Carb ratio 1:1 Sensitivity 1: 50 Target 120 and correction threshold 120  Non-insulin hypoglycemic drugs: Farxiga, Rybelsus and Actos       Side effects from medications: None  Current self management, blood sugar patterns and problems identified:  A1c is 8.1, was 8.2 Because of his poor control and flexibility with mealtime insulin he was started on OmniPod insulin pump on 02/16/2022 Although he has been able to use this fairly well he is seems to be a little confused about how much bolus to take and when to take it He is not taking a bolus dose when he starts to eat because of blood sugars being fairly good before the meal However he does usually take his morning bolus before eating  He has a few missed boluses at lunch and dinner causing blood sugars to be over 300 at times Also occasionally will have regular soft drinks which will cause his blood sugars to be extremely high  He does have periodic hypoglycemia but this appears to be either late at night after a delayed bolus and sporadically before dinnertime He is not adjusting his mealtime dose based on what he is eating and even with his largest meal at dinnertime is taking only 6-7 units of boluses His dose of Humalog at breakfast appears to be excessive Today treated his low blood sugars in the office with juice twice and 4 glucose tablets over a period of 20 minutes and explained to him that his low blood sugar  was related to taking 7 units of bolus for a relatively higher protein low carbohydrate meal  Diet management: Largest meal is at dinnertime      CGM use % of time   2-week average/GV 160/46  Time in range    66%  % Time Above 180 17 6  % Time above 250 13  % Time Below 70 3+1     Interpretation of his CGM for the Dexcom sensor shows the following OVERNIGHT blood sugars are mostly well controlled within the target range but frequently higher for the first couple of hours after midnight and also at least twice normal right after midnight Premeal readings are fairly well controlled except occasionally low before dinnertime POSTPRANDIAL readings are as follows After breakfast he has a few sporadic significantly high readings but usually blood sugars are even with Premeal readings and at least 2 or 3 times are low After lunch blood sugars are highly variable but frequently appear to be very high around 3 PM Also after dinner blood sugars are mostly running significantly high above the target range  Previously:  CGM use % of time 86  2-week average/GV 233/41  Time in range      35%  % Time Above 180 22  % Time above 250 41  % Time Below 70 <1     PRE-MEAL Fasting Lunch Dinner Bedtime Overall  Glucose range:       Averages: 166  284     POST-MEAL PC Breakfast PC Lunch PC Dinner  Glucose range:     Averages:   286             Dietician visit: Most recent:  10/09/21    Weight control:  Wt Readings from Last 3 Encounters:  03/04/22 179 lb 9.6 oz (81.5 kg)  02/27/22 170 lb (77.1 kg)  02/24/22 170 lb (77.1 kg)            Diabetes labs:  Lab Results  Component Value Date   HGBA1C 8.1 (H) 02/27/2022   HGBA1C 8.2 (A) 12/17/2021   HGBA1C 12.8 (A) 09/12/2021   Lab Results  Component Value Date   MICROALBUR 4.8 (H) 02/27/2022   LDLCALC 50 02/27/2022   CREATININE 1.93 (H) 02/27/2022     Allergies as of 03/04/2022       Reactions   Clindamycin/lincomycin Swelling    Possible facial and lip swelling   Ace Inhibitors    Facial and lip swelling possibly associated        Medication List        Accurate as of March 04, 2022  8:38 AM. If you have any questions, ask your nurse or doctor.          aspirin EC 81 MG tablet Take 81 mg daily by mouth.   atorvastatin 20 MG tablet Commonly known as: LIPITOR Take 1 tablet (20 mg total) by mouth daily.   Blood Pressure Kit Devi 1 kit by Does not apply route daily.   brimonidine 0.2 % ophthalmic solution Commonly known as: ALPHAGAN SMARTSIG:In Eye(s)   Calcium Carbonate-Vitamin D 600-400 MG-UNIT tablet Commonly known as: Caltrate 600+D Take 1 tablet by mouth 2 (two) times daily.   dapagliflozin propanediol 10 MG Tabs tablet Commonly known as: FARXIGA Take 10 mg by mouth daily.   Dexcom G6 Receiver Devi Use as directed to check blood sugar.   Dexcom G6 Sensor Misc Change every 10 days   Dexcom G6 Transmitter Misc Change every 3 months   Fish Oil 300 MG Caps Take 1 capsule (300 mg total) by mouth daily.   hydrochlorothiazide 12.5 MG capsule Commonly known as: MICROZIDE Take 1 capsule (12.5 mg total) by mouth daily.   insulin lispro 100 UNIT/ML injection Commonly known as: HumaLOG Inject 50 units in pump daily   latanoprost 0.005 % ophthalmic solution Commonly known as: XALATAN Place 1 drop into both eyes at bedtime.   levothyroxine 25 MCG tablet Commonly known as: SYNTHROID TAKE 1 TABLET BY MOUTH  DAILY BEFORE BREAKFAST   Linzess 145 MCG Caps capsule Generic drug: linaclotide TAKE 1 CAPSULE BY MOUTH DAILY  BEFORE BREAKFAST   meloxicam 7.5 MG tablet Commonly known as: MOBIC Take 7.5 mg by mouth daily.   meloxicam 15 MG tablet Commonly known as: MOBIC Take 1 tablet (15 mg total) by mouth daily.   metoprolol succinate 25 MG 24 hr tablet Commonly known as: TOPROL-XL TAKE 1 TABLET BY MOUTH ONCE  DAILY   multivitamin with minerals Tabs tablet Take 1 tablet by mouth  daily.   Omnipod 5 G6 Pod (Gen 5) Misc 1 Device by Does not apply route every 3 (three) days.   OneTouch Delica Plus [id number] Misc CHECK BLOOD SUGAR TWICE  DAILY   OneTouch Verio test strip Generic drug: glucose blood TEST TWICE DAILY   Pen Needles 32G X 4 MM Misc Use as Directed 3 times  daily to inject insulin   pioglitazone 30 MG tablet Commonly known as: ACTOS TAKE 1 TABLET BY MOUTH  DAILY   polyethylene glycol powder 17 GM/SCOOP powder Commonly known as: GLYCOLAX/MIRALAX Take 17 g by mouth 2 (two) times daily as needed.   potassium chloride SA 20 MEQ tablet Commonly known as: KLOR-CON M TAKE 1 TABLET BY MOUTH  TWICE DAILY What changed: how much to take   Rybelsus 14 MG Tabs Generic drug: Semaglutide TAKE 1 TABLET BY MOUTH DAILY   Toujeo SoloStar 300 UNIT/ML Solostar Pen Generic drug: insulin glargine (1 Unit Dial) Inject 35 Units into the skin daily.   triamcinolone ointment 0.5 % Commonly known as: KENALOG APPLY TO AFFECTED AREA(S)  TOPICALLY TWICE DAILY   Vitamin D 50 MCG (2000 UT) tablet Take 2,000 Units by mouth daily.        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   Pacemaker    Medtronic Adapta [id number]   Seizure disorder ([place])    Seizures ([place])    last time in 1990's    Past Surgical History:  Procedure Laterality Date   LACERATION REPAIR  1980   scalp   PACEMAKER LEAD REMOVAL N/A 03/23/2014   Procedure: PACEMAKER LEAD REMOVAL;  Surgeon: Evans Lance, MD;  Location: [facility];  Service: Cardiovascular;  Laterality: N/A;   PTDVP     7/08 medtronic  DDD    Family History  Problem Relation Age of Onset   Hypertension Mother    COPD Mother    Hypertension Brother    Cancer Brother        leukemia   Hypertension Brother    Diabetes Neg Hx     Social History:  reports that he has never smoked. He has never used smokeless tobacco. He reports that he does not drink alcohol and does not use drugs.  Review of Systems:  Last diabetic eye exam date 2/23  Last foot exam date: 10/22  Symptoms of neuropathy: None  Hypertension:   Treatment includes HCTZ Also followed by nephrologist  BP Readings from Last 3 Encounters:  03/04/22 112/78  02/27/22 132/82  02/24/22 138/84    Lipids:     Lab Results  Component Value Date   CHOL 113 02/27/2022   CHOL 119 03/13/2021   CHOL 187 04/20/2019   Lab Results  Component Value Date   HDL 49.40 02/27/2022   HDL 41.30 03/13/2021   HDL 46.20 04/20/2019   Lab Results  Component Value Date   LDLCALC 50 02/27/2022   LDLCALC 57 03/13/2021   [place] 85 05/02/2018   Lab Results  Component Value Date   TRIG 68.0 02/27/2022   TRIG 107.0 03/13/2021   TRIG 209.0 (H) 04/20/2019   Lab Results  Component Value Date   CHOLHDL 2 02/27/2022   CHOLHDL 3 03/13/2021   CHOLHDL 4 04/20/2019   Lab Results  Component Value Date   LDLDIRECT 102.0 04/20/2019   Currently on low-dose levothyroxine 25 mcg and TSH  is normal  Lab Results  Component Value Date   TSH 2.65 02/27/2022   TSH 4.65 (H) 08/02/2020   TSH 2.36 06/10/2017      Examination:   BP 112/78   Pulse 84   Ht 6' (1.829 m)   Wt 179 lb 9.6 oz (81.5 kg)   SpO2 96%   BMI 24.36 kg/m   Body mass index is 24.36 kg/m.    ASSESSMENT/ PLAN:    Diabetes type 2 on insulin:   Current regimen: Insulin pump and oral agents including Rybelsus, Farxiga and Actos  A1c is 8.1 and about the same  He is now using OmniPod insulin pump for the last 2 weeks Although he is doing better with his overall control recently is still not  able to understand the principles of using the pump and the bolus function He is not able to remember to bolus before eating and is also not aware of when to bolus or how much  Blood glucose control is better than before but still inadequate because of late, missed or inadequate boluses at lunch and dinner He still has some variability in his blood sugars overall  Recommendations:  Discussed in detail the need for bolusing before starting to eat and the time course of action of mealtime bolus insulin He also likely needs less insulin at breakfast and may do only 3 units instead of 5 Also likely needs 4 to 5 units more insulin coverage for lunch and dinner   We will schedule follow-up with diabetes educator to review the bolus management and patterns in a couple of weeks  LIPIDS: Well-controlled Microalbumin normal  TSH normal on levothyroxine but consider stopping it to see if he only had subclinical disease  There are no Patient Instructions on file for this visit.  Total visit time today face-to-face including evaluation and management, counseling, management of hypoglycemia in the office = 40 minutes  Elayne Snare 03/04/2022, 8:38 AM

## 2022-03-04 NOTE — Patient Instructions (Addendum)
Check  blood sugars about 2 hours after meals to see how you are boluses doing  Recommended blood sugar levels on waking up are 90-130 and about 2 hours after meal is 130-180   Bolus 8-10 units at lunch and 10-12 at dinner ALWAYS bolus before starting to eat every meal regardless of blood sugar  Take only 3 units before breakfast Bfst  Avoid regular and sweet drinks

## 2022-03-06 ENCOUNTER — Encounter: Payer: Self-pay | Admitting: Endocrinology

## 2022-03-08 DIAGNOSIS — E119 Type 2 diabetes mellitus without complications: Secondary | ICD-10-CM | POA: Diagnosis not present

## 2022-03-09 NOTE — Therapy (Addendum)
OUTPATIENT PHYSICAL THERAPY SHOULDER EVALUATION   Patient Name: Michael Snyder MRN: [id number] DOB:04-29-1951, 71 y.o., male Today's Date: 03/10/2022   PT End of Session - 03/10/22 1056     Visit Number 1    Number of Visits 12    Date for PT Re-Evaluation 05/05/22    Authorization Type [employer]    PT Start Time 1002    PT Stop Time 1045    PT Time Calculation (min) 43 min    Activity Tolerance Patient tolerated treatment well;Patient limited by pain    Behavior During Therapy WFL for tasks assessed/performed             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   Pacemaker    Medtronic Adapta [id number]   Seizure disorder (HCC)    Seizures (HCC)    last time in 1990's   Past Surgical History:  Procedure Laterality Date   LACERATION REPAIR  1980   scalp   PACEMAKER LEAD REMOVAL N/A 03/23/2014   Procedure: PACEMAKER LEAD REMOVAL;  Surgeon: Marinus Maw, MD;  Location: [facility];  Service: Cardiovascular;  Laterality: N/A;   PTDVP     7/08 medtronic DDD   Patient Active Problem List   Diagnosis Date Noted   Acute pain of right shoulder 02/24/2022   Stage 3b chronic kidney disease (HCC) 09/12/2021   Hypothyroidism 08/03/2020   Constipation 07/18/2020   De Quervain's tenosynovitis, left 03/14/2019   Left wrist pain 09/23/2018   Hepatitis C virus infection cured after antiviral drug therapy 12/24/2015   Cataract, right eye 11/13/2014   Cataract, left eye 10/18/2014   Complications, pacemaker cardiac, 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/2014   Atrial fibrillation (HCC) 11/17/2012   Cardiac pacemaker MDT 11/26/2011   Routine health maintenance 06/16/2011   Essential hypertension 06/01/2008   Diabetes mellitus type 2 with complications (HCC) 03/26/2007   SICK SINUS SYNDROME 03/26/2007    SEIZURE DISORDER 03/26/2007    PCP: Myrlene Broker, MD  REFERRING PROVIDER: Richardean Sale, DO  REFERRING DIAG: [phone] (ICD-10-CM) - Chronic right shoulder pain S46.011A (ICD-10-CM) - Rotator cuff strain, right, initial encounter   THERAPY DIAG: Rotator cuff strain, right, initial encounter   Rationale for Evaluation and Treatment Rehabilitation  ONSET DATE: 02/27/22  SUBJECTIVE:  SUBJECTIVE STATEMENT: 02/27/22 Patient is a 71 year old male complaining of right shoulder pain. Patient states pain to shoulder while moving furniture in the last week or so. Moderate to severe. no numbness or tingling,no locking clicking or popping  Given significant lack of ROM due to pain could be rotator cuff injury. Rx tramadol for pain relief 50 mg QID prn. He was given meloxicam by urgent care however with his CKD stage 3b he should not take this and was advised to use tylenol only in addition to the tramadol. hasn't started the tramadol yet   PERTINENT HISTORY: . Chronic right shoulder pain 2. Rotator cuff strain, right, initial encounter -Chronic with exacerbation, initial sports medicine visit - Acutely worsening right shoulder pain after incident with patient moving furniture with history of intermittent shoulder pain - Consistent with rotator cuff pathology.  I do not think the patient has a complete tear of any rotator cuff muscle based on maintained strength, however he may have large degree of partial tears versus grade 1 strain - Offered subacromial CSI, however patient stated that prior CSI significantly raise his blood glucose with history of DM type II, so patient wishes to avoid CSI - Start meloxicam 15 mg daily x2 weeks.  If still having pain after 2 weeks, Do not to use additional NSAIDs while  taking meloxicam.  May use Tylenol [phone] mg 2 to 3 times a day for breakthrough pain.  We will only use meloxicam for a short 2-week course and no longer due to patient's comorbidities - Start HEP and physical therapy for shoulder and rotator cuff - Ambulatory referral to Physical Therapy -X-ray obtained in clinic.  My interpretation: No acute fracture or dislocation.  Hyper sclerosis and glenoid and AC joint arthritis    PAIN:  Are you having pain? Yes: NPRS scale: 10/10 Pain location: R shoulder Pain description: ache, sharp Aggravating factors: reaching and lifting Relieving factors: rest  PRECAUTIONS: None  WEIGHT BEARING RESTRICTIONS No  FALLS:  Has patient fallen in last 6 months? No  LIVING ENVIRONMENT: Lives with: lives with their family  OCCUPATION: retired  PLOF: Independent  PATIENT GOALS To reduce and manage my R shoulder pain  OBJECTIVE:   DIAGNOSTIC FINDINGS:  X-ray obtained in clinic.  My interpretation: No acute fracture or dislocation.  Hyper sclerosis and glenoid and AC joint arthritis  PATIENT SURVEYS:  FOTO 36(65 predicted)  COGNITION:  Overall cognitive status: Within functional limits for tasks assessed     SENSATION: Not tested  POSTURE: Rounded shoulders   UPPER EXTREMITY ROM:   A/PROM Right eval Left eval  Shoulder flexion 90/120   Shoulder extension    Shoulder abduction 90/150   Shoulder adduction    Shoulder internal rotation 70/80   Shoulder external rotation 60/70   Elbow flexion WFL   Elbow extension WFL   Wrist flexion    Wrist extension    Wrist ulnar deviation    Wrist radial deviation    Wrist pronation    Wrist supination    (Blank rows = not tested)  UPPER EXTREMITY MMT:  MMT Right eval Left eval  Shoulder flexion 4p!   Shoulder extension 5   Shoulder abduction 4p!   Shoulder adduction    Shoulder internal rotation 4   Shoulder external rotation 4   Middle trapezius    Lower trapezius    Elbow  flexion 4-p!   Elbow extension 4   Wrist flexion    Wrist extension  Wrist ulnar deviation    Wrist radial deviation    Wrist pronation    Wrist supination    Grip strength (lbs)    (Blank rows = not tested)  SHOULDER SPECIAL TESTS:  Impingement tests: Neer impingement test: positive  and Hawkins/Kennedy impingement test: positive   Rotator cuff assessment: Drop arm test: negative, Empty can test: positive , and Full can test: positive   Biceps assessment:  R "Popeye" deformity  JOINT MOBILITY TESTING:  Not tested  PALPATION:  TTP R biceps tendon proximal   TODAY'S TREATMENT:  Evaluation   PATIENT EDUCATION: Education details: Discussed eval findings, rehab rationale and POC and patient is in agreement  Person educated: Patient Education method: Explanation Education comprehension: verbalized understanding and needs further education   HOME EXERCISE PROGRAM: Access Code: [email or web address] URL: [email or web address] Date: 03/10/2022 Prepared by: Gustavus Bryant  Exercises - Supine Shoulder Press with Dowel  - 2 x daily - 5 x weekly - 3 sets - 10 reps - Supine Shoulder External Rotation with Dowel  - 2 x daily - 5 x weekly - 3 sets - 10 reps - Shoulder Flexion Wall Slide with Towel  - 2 x daily - 5 x weekly - 3 sets - 10 reps  ASSESSMENT:  CLINICAL IMPRESSION: Patient is a 71 y.o. male who was seen today for physical therapy evaluation and treatment for R shoulder pain and stiffness. Patient demos strength deficits throughout shoulder and elbow, positive impingement signs, limited A/PROM.  RC appears intact however R biceps tear suspected due to "popeye" deformity.   OBJECTIVE IMPAIRMENTS decreased knowledge of condition, decreased ROM, decreased strength, impaired UE functional use, postural dysfunction, and pain.   ACTIVITY LIMITATIONS carrying, lifting, dressing, and reach over head  PERSONAL FACTORS Age, Fitness, and 1 comorbidity: R biceps tendon rupture  are also affecting patient's functional outcome.   REHAB POTENTIAL: Good  CLINICAL DECISION MAKING: Stable/uncomplicated  EVALUATION COMPLEXITY: Low   GOALS: Goals reviewed with patient? Yes  SHORT TERM GOALS: Target date: 03/31/22    Patient to demonstrate independence in HEP  Baseline:[email or web address] Goal status: INITIAL  2.  Decrease worst pain to 8/10 Baseline: 10/10 Goal status: INITIAL  3.  Increase AROM of flexion and abduction to 120d Baseline: 90d each motion Goal status: INITIAL   LONG TERM GOALS: Target date: 04/21/2022    160d AROM flexion an abduction Baseline: 90d each Goal status: INITIAL  2.  4+/5 R shoulder/elbow strength Baseline:  MMT Right eval Left eval  Shoulder flexion 4p!   Shoulder extension 5   Shoulder abduction 4p!   Shoulder adduction    Shoulder internal rotation 4   Shoulder external rotation 4   Middle trapezius    Lower trapezius    Elbow flexion 4-p!   Elbow extension 4    Goal status: INITIAL  3.  Increase FOTO to 65 Baseline: 36  Goal status: INITIAL  4.  4/10 worst pain Baseline: 10/10 Goal status: INITIAL    PLAN: PT FREQUENCY: 1-2x/week  PT DURATION: 6 weeks  PLANNED INTERVENTIONS: Therapeutic exercises, Therapeutic activity, Neuromuscular re-education, Balance training, Gait training, Patient/Family education, Self Care, Joint mobilization, Manual therapy, and Re-evaluation  PLAN FOR NEXT SESSION: ROM, RC and shoulder strengthening, posture retraining, functional tasks   Hildred Laser, PT 03/10/2022, 11:17 AM

## 2022-03-10 ENCOUNTER — Ambulatory Visit: Payer: Medicare Other | Attending: Sports Medicine

## 2022-03-10 DIAGNOSIS — S46011A Strain of muscle(s) and tendon(s) of the rotator cuff of right shoulder, initial encounter: Secondary | ICD-10-CM | POA: Insufficient documentation

## 2022-03-10 DIAGNOSIS — M25511 Pain in right shoulder: Secondary | ICD-10-CM | POA: Diagnosis not present

## 2022-03-10 DIAGNOSIS — G8929 Other chronic pain: Secondary | ICD-10-CM | POA: Diagnosis not present

## 2022-03-11 ENCOUNTER — Telehealth: Payer: Medicare Other

## 2022-03-11 NOTE — Progress Notes (Signed)
Michael Snyder D.[facility] Phone: [phone]   Assessment and Plan:     1. Biceps tendon rupture, proximal, right, initial encounter -Acute, uncomplicated, subsequent sports medicine visit -Consistent with proximal biceps tendon tear based on history, physical exam, ultrasound - Patient has minimal to no pain, maintain function, so no intervention is needed at this time.  Discussed that surgical intervention could be used for patient if he has continued and unrelenting pain, significant decreased function, or for cosmetic reasons, however surgery does not necessarily need to be done and most patients do well with conservative measures. - Patient may restart physical therapy and follow-up as needed  Sports Medicine: Musculoskeletal Ultrasound. Exam: Limited US of right upper arm Diagnosis: Michael Snyder's deformity of right bicep  US Findings: Absence of biceps tendon and bicipital groove.  Retraction of biceps tendon roughly 1/3 distance of humerus.  Biceps musculature grooved inferiorly.  Distal biceps tendon intact  US Impression:  Proximal biceps tendon rupture  [place] LIMITED JOINT SPACE STRUCTURES UP RIGHT(NO LINKED CHARGES)    Pertinent previous records reviewed include discussions with patient's physical therapist related to Michael Snyder deformity   Follow Up: As needed if no improvement or worsening of symptoms.   Subjective:   I, Michael Snyder, am serving as a Education administrator for Doctor Michael Snyder  Chief Complaint: right bicep pain   HPI:   03/12/2022 Patient is a 71 year old male complaining of right bicep pain. Patient states that he has a Michael Snyder deformity, been like that for a couple of weeks, a piece of furniture was about to fall on his daughter and he caught it to save her from getting hit    Relevant Historical Information: DM type II, CKD, hypertension  Additional pertinent review of systems  negative.   Current Outpatient Medications:    aspirin EC 81 MG tablet, Take 81 mg daily by mouth., Disp: , Rfl:    atorvastatin (LIPITOR) 20 MG tablet, Take 1 tablet (20 mg total) by mouth daily., Disp: 90 tablet, Rfl: 3   Blood Pressure Monitoring (BLOOD PRESSURE KIT) DEVI, 1 kit by Does not apply route daily., Disp: 1 each, Rfl: 0   brimonidine (ALPHAGAN) 0.2 % ophthalmic solution, SMARTSIG:In Eye(s), Disp: , Rfl:    Calcium Carbonate-Vitamin D (CALTRATE 600+D) 600-400 MG-UNIT per tablet, Take 1 tablet by mouth 2 (two) times daily., Disp: 180 tablet, Rfl: 3   Cholecalciferol (VITAMIN D) 50 MCG (2000 UT) tablet, Take 2,000 Units by mouth daily., Disp: , Rfl:    Continuous Blood Gluc Receiver ([place]) DEVI, Use as directed to check blood sugar., Disp: 1 each, Rfl: 0   Continuous Blood Gluc Sensor (DEXCOM G6 SENSOR) MISC, Change every 10 days, Disp: 9 each, Rfl: 3   Continuous Blood Gluc Transmit (DEXCOM G6 TRANSMITTER) MISC, Change every 3 months, Disp: 1 each, Rfl: 3   dapagliflozin propanediol (FARXIGA) 10 MG TABS tablet, Take 10 mg by mouth daily., Disp: , Rfl:    hydrochlorothiazide (MICROZIDE) 12.5 MG capsule, Take 1 capsule (12.5 mg total) by mouth daily., Disp: 90 capsule, Rfl: 3   Insulin Disposable Pump (OMNIPOD 5 G6 POD, GEN 5,) MISC, 1 Device by Does not apply route every 3 (three) days., Disp: 2 each, Rfl: 3   insulin glargine, 1 Unit Dial, (TOUJEO SOLOSTAR) 300 UNIT/ML Solostar Pen, Inject 35 Units into the skin daily. (Patient not taking: Reported on 03/04/2022), Disp: 6 mL, Rfl: 3  insulin lispro (HUMALOG) 100 UNIT/ML injection, Inject 50 units in pump daily, Disp: 20 mL, Rfl: 2   Insulin Pen Needle (PEN NEEDLES) 32G X 4 MM MISC, Use as Directed 3 times daily to inject insulin, Disp: 100 each, Rfl: 5   Lancets (ONETOUCH DELICA PLUS ALPFXT02I) MISC, CHECK BLOOD SUGAR TWICE  DAILY, Disp: 200 each, Rfl: 2   latanoprost (XALATAN) 0.005 % ophthalmic solution, Place 1 drop  into both eyes at bedtime., Disp: , Rfl:    levothyroxine (SYNTHROID) 25 MCG tablet, Take 1 tablet (25 mcg total) by mouth daily before breakfast., Disp: 90 tablet, Rfl: 3   LINZESS 145 MCG CAPS capsule, TAKE 1 CAPSULE BY MOUTH DAILY  BEFORE BREAKFAST, Disp: 90 capsule, Rfl: 3   meloxicam (MOBIC) 15 MG tablet, Take 1 tablet (15 mg total) by mouth daily., Disp: 14 tablet, Rfl: 0   meloxicam (MOBIC) 7.5 MG tablet, Take 7.5 mg by mouth daily., Disp: , Rfl:    metoprolol succinate (TOPROL-XL) 25 MG 24 hr tablet, TAKE 1 TABLET BY MOUTH ONCE  DAILY, Disp: 90 tablet, Rfl: 0   Multiple Vitamin (MULTIVITAMIN WITH MINERALS) TABS tablet, Take 1 tablet by mouth daily., Disp: , Rfl:    Omega-3 Fatty Acids (FISH OIL) 300 MG CAPS, Take 1 capsule (300 mg total) by mouth daily., Disp: 90 capsule, Rfl: 3   ONETOUCH VERIO test strip, TEST TWICE DAILY, Disp: 200 strip, Rfl: 3   pioglitazone (ACTOS) 30 MG tablet, Take 1 tablet (30 mg total) by mouth daily., Disp: 90 tablet, Rfl: 3   polyethylene glycol powder (GLYCOLAX/MIRALAX) 17 GM/SCOOP powder, Take 17 g by mouth 2 (two) times daily as needed., Disp: 255 g, Rfl: 0   potassium chloride SA (KLOR-CON M) 20 MEQ tablet, TAKE 1 TABLET BY MOUTH  TWICE DAILY (Patient taking differently: Take 10 mEq by mouth 2 (two) times daily.), Disp: 180 tablet, Rfl: 2   RYBELSUS 14 MG TABS, TAKE 1 TABLET BY MOUTH DAILY, Disp: 90 tablet, Rfl: 3   triamcinolone ointment (KENALOG) 0.5 %, APPLY TO AFFECTED AREA(S)  TOPICALLY TWICE DAILY, Disp: 90 g, Rfl: 0   Objective:     Vitals:   03/12/22 0947  BP: 122/78  Pulse: 84  SpO2: 100%  Weight: 179 lb (81.2 kg)  Height: 6' (1.829 m)      Body mass index is 24.28 kg/m.    Physical Exam:    Gen: Appears well, nad, nontoxic and pleasant Neuro:sensation intact, strength is 5/5 with df/pf/inv/ev, muscle tone wnl Skin: no suspicious lesion or defmority Psych: A&O, appropriate mood and affect  Right shoulder: Michael Snyder deformity with  biceps muscle grouping distally.  No swelling or muscle wasting Maintained elbow flexion and extension without pain or weakness. No scapular winging FF 180, abd 180, int 0, ext 90 NTTP over the [place], clavicle, ac, coracoid, biceps groove, humerus, deltoid, trapezius, cervical spine   Neg ant drawer, sulcus sign, apprehension Negative Spurling's test bilat FROM of neck    Electronically signed by:  Michael Snyder D.[facility] 10:10 AM 03/12/22

## 2022-03-12 ENCOUNTER — Ambulatory Visit: Payer: Self-pay

## 2022-03-12 ENCOUNTER — Ambulatory Visit: Payer: Medicare Other | Admitting: Sports Medicine

## 2022-03-12 VITALS — BP 122/78 | HR 84 | Ht 72.0 in | Wt 179.0 lb

## 2022-03-12 DIAGNOSIS — S46211A Strain of muscle, fascia and tendon of other parts of biceps, right arm, initial encounter: Secondary | ICD-10-CM | POA: Diagnosis not present

## 2022-03-12 NOTE — Patient Instructions (Signed)
Good to see you   

## 2022-03-16 ENCOUNTER — Ambulatory Visit: Payer: Medicare Other

## 2022-03-16 DIAGNOSIS — G8929 Other chronic pain: Secondary | ICD-10-CM | POA: Diagnosis not present

## 2022-03-16 DIAGNOSIS — M25511 Pain in right shoulder: Secondary | ICD-10-CM

## 2022-03-16 DIAGNOSIS — S46011A Strain of muscle(s) and tendon(s) of the rotator cuff of right shoulder, initial encounter: Secondary | ICD-10-CM | POA: Diagnosis not present

## 2022-03-16 NOTE — Therapy (Signed)
OUTPATIENT PHYSICAL THERAPY TREATMENT NOTE   Patient Name: Michael Snyder MRN: [id number] DOB:[birth year], 71 y.o., male Today's Date: 03/16/2022  PCP: Hoyt Koch, MD REFERRING PROVIDER: Glennon Mac, DO  END OF SESSION:   PT End of Session - 03/16/22 0825     Visit Number 2    Number of Visits 12    Date for PT Re-Evaluation 05/05/22    Authorization Type UHC    PT Start Time 0830    PT Stop Time 0910    PT Time Calculation (min) 40 min    Activity Tolerance Patient tolerated treatment well;Patient limited by pain    Behavior During Therapy WFL for tasks assessed/performed             Past Medical History:  Diagnosis Date   Alcohol abuse    Arthritis    Diabetes mellitus, type 2 ([place])    Head injury, closed, without LOC 1980s   History of alcohol abuse    Hyperlipidemia    Hypertension    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   Pacemaker    Medtronic Adapta [id number]   Seizure disorder ([place])    Seizures ([place])    last time in 1990's   Past Surgical History:  Procedure Laterality Date   LACERATION REPAIR  1980   scalp   PACEMAKER LEAD REMOVAL N/A 03/23/2014   Procedure: PACEMAKER LEAD REMOVAL;  Surgeon: Evans Lance, MD;  Location: [facility];  Service: Cardiovascular;  Laterality: N/A;   PTDVP     7/08 medtronic DDD   Patient Active Problem List   Diagnosis Date Noted   Acute pain of right shoulder 02/24/2022   Stage 3b chronic kidney disease ([place]) 09/12/2021   Hypothyroidism 08/03/2020   Constipation 07/18/2020   De Quervain's tenosynovitis, left 03/14/2019   Left wrist pain 09/23/2018   Hepatitis C virus infection cured after antiviral drug therapy 12/24/2015   Cataract, right eye 11/13/2014   Cataract, left eye [id number]   Complications, pacemaker cardiac, 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/2014   Atrial fibrillation ([place]) 11/17/2012   Cardiac pacemaker MDT 11/26/2011   Routine health maintenance 06/16/2011   Essential hypertension 06/01/2008    Diabetes mellitus type 2 with complications ([place]) [id number]   SICK SINUS SYNDROME 03/26/2007   SEIZURE DISORDER 03/26/2007    REFERRING DIAG: M25.511,G89.29 (ICD-10-CM) - Chronic right shoulder pain S46.011A (ICD-10-CM) - Rotator cuff strain, right, initial encounter   THERAPY DIAG: Rotator cuff strain, right, initial encounter    Rationale for Evaluation and Treatment Rehabilitation  PERTINENT HISTORY: Chronic right shoulder pain 2. Rotator cuff strain, right, initial encounter -Chronic with exacerbation, initial sports medicine visit - Acutely worsening right shoulder pain after incident with patient moving furniture with history of intermittent shoulder pain - Consistent with rotator cuff pathology.  I do not think the patient has a complete tear of any rotator cuff muscle based on maintained strength, however he may have large degree of partial tears versus grade 1 strain - Offered subacromial CSI, however patient stated that prior CSI significantly raise his blood glucose with history of DM type II, so patient wishes to avoid CSI - Start meloxicam 15 mg daily x2 weeks.  If still having pain after 2 weeks, Do not to use additional NSAIDs while taking meloxicam.  May use Tylenol [phone] mg 2 to 3 times a day for breakthrough pain.  We will only use meloxicam for a short 2-week course and no longer due to patient's comorbidities - Start  HEP and physical therapy for shoulder and rotator cuff - Ambulatory referral to Physical Therapy -X-ray obtained in clinic.  My interpretation: No acute fracture or dislocation.  Hyper sclerosis and glenoid and AC joint arthritis  PRECAUTIONS: R biceps tendon tear, non-functional L shoulder due to trauma  SUBJECTIVE: Saw Dr. Jean Rosenthal for f/u regarding R biceps tendon tear   PAIN:  Are you having pain? Yes: NPRS scale: 5/10 Pain location: R shoulder Pain description: ache Aggravating factors: OH reaching Relieving factors: OTC  ointment   OBJECTIVE: (objective measures completed at initial evaluation unless otherwise dated)  DIAGNOSTIC FINDINGS:  X-ray obtained in clinic.  My interpretation: No acute fracture or dislocation.  Hyper sclerosis and glenoid and AC joint arthritis   PATIENT SURVEYS:  FOTO 36(65 predicted)   COGNITION:           Overall cognitive status: Within functional limits for tasks assessed                                  SENSATION: Not tested   POSTURE: Rounded shoulders    UPPER EXTREMITY ROM:    A/PROM Right eval Left eval  Shoulder flexion 90/120    Shoulder extension      Shoulder abduction 90/150    Shoulder adduction      Shoulder internal rotation 70/80    Shoulder external rotation 60/70    Elbow flexion WFL    Elbow extension WFL    Wrist flexion      Wrist extension      Wrist ulnar deviation      Wrist radial deviation      Wrist pronation      Wrist supination      (Blank rows = not tested)   UPPER EXTREMITY MMT:   MMT Right eval Left eval  Shoulder flexion 4p!    Shoulder extension 5    Shoulder abduction 4p!    Shoulder adduction      Shoulder internal rotation 4    Shoulder external rotation 4    Middle trapezius      Lower trapezius      Elbow flexion 4-p!    Elbow extension 4    Wrist flexion      Wrist extension      Wrist ulnar deviation      Wrist radial deviation      Wrist pronation      Wrist supination      Grip strength (lbs)      (Blank rows = not tested)   SHOULDER SPECIAL TESTS:            Impingement tests: Neer impingement test: positive  and Hawkins/Kennedy impingement test: positive             Rotator cuff assessment: Drop arm test: negative, Empty can test: positive , and Full can test: positive             Biceps assessment:  R "Popeye" deformity   JOINT MOBILITY TESTING:  Not tested   PALPATION:  TTP R biceps tendon proximal             TODAY'S TREATMENT:  [facility] Adult PT Treatment:  DATE: 03/16/22 Therapeutic Exercise: Supine elbow flexion R 500g ball 15x Supine press R 500g ball 15x Supine flexion R 500g ball 15x L sidelie R ER 500g 15x L sidelie abduction 500g ball 15x Prone flexion 500g ball 15x Prone extenison 500g ball 15x Prone hor abd 500g ball 15x Prone scaption 500g ball 15x Manual Therapy: PROM R shoulder D1 F/E PNF 15x Post and inf GH glides 5x10 ea.      PATIENT EDUCATION: Education details: Discussed eval findings, rehab rationale and POC and patient is in agreement  Person educated: Patient Education method: Explanation Education comprehension: verbalized understanding and needs further education     HOME EXERCISE PROGRAM: Access Code: [email or web address] URL: [email or web address] Date: 03/10/2022 Prepared by: Gustavus Bryant   Exercises - Supine Shoulder Press with Dowel  - 2 x daily - 5 x weekly - 3 sets - 10 reps - Supine Shoulder External Rotation with Dowel  - 2 x daily - 5 x weekly - 3 sets - 10 reps - Shoulder Flexion Wall Slide with Towel  - 2 x daily - 5 x weekly - 3 sets - 10 reps   ASSESSMENT:   CLINICAL IMPRESSION: Todays session initiated AROM and strengthening of R shoulder. Unable to prticipate in AAROM due to non-functional L shoulder.  Guarding noted through R shoulder but overall ROM functional.  Substitution patterns noted with AROM due to weakness.     OBJECTIVE IMPAIRMENTS decreased knowledge of condition, decreased ROM, decreased strength, impaired UE functional use, postural dysfunction, and pain.    ACTIVITY LIMITATIONS carrying, lifting, dressing, and reach over head   PERSONAL FACTORS Age, Fitness, and 1 comorbidity: R biceps tendon rupture are also affecting patient's functional outcome.    REHAB POTENTIAL: Good   CLINICAL DECISION MAKING: Stable/uncomplicated   EVALUATION COMPLEXITY: Low     GOALS: Goals reviewed with patient? Yes   SHORT TERM GOALS: Target date: 03/31/22      Patient to demonstrate independence in HEP  Baseline:[id number] Goal status: INITIAL   2.  Decrease worst pain to 8/10 Baseline: 10/10 Goal status: INITIAL   3.  Increase AROM of flexion and abduction to 120d Baseline: 90d each motion Goal status: INITIAL     LONG TERM GOALS: Target date: 04/21/2022     160d AROM flexion an abduction Baseline: 90d each Goal status: INITIAL   2.  4+/5 R shoulder/elbow strength Baseline:  MMT Right eval Left eval  Shoulder flexion 4p!    Shoulder extension 5    Shoulder abduction 4p!    Shoulder adduction      Shoulder internal rotation 4    Shoulder external rotation 4    Middle trapezius      Lower trapezius      Elbow flexion 4-p!    Elbow extension 4      Goal status: INITIAL   3.  Increase FOTO to 65 Baseline: 36  Goal status: INITIAL   4.  4/10 worst pain Baseline: 10/10 Goal status: INITIAL       PLAN: PT FREQUENCY: 1-2x/week   PT DURATION: 6 weeks   PLANNED INTERVENTIONS: Therapeutic exercises, Therapeutic activity, Neuromuscular re-education, Balance training, Gait training, Patient/Family education, Self Care, Joint mobilization, Manual therapy, and Re-evaluation   PLAN FOR NEXT SESSION: ROM, RC and shoulder strengthening, posture retraining, functional tasks, HEP review and update.     Hildred Laser, PT 03/16/2022, 9:09 AM

## 2022-03-19 ENCOUNTER — Ambulatory Visit: Payer: Medicare Other

## 2022-03-19 DIAGNOSIS — S46011A Strain of muscle(s) and tendon(s) of the rotator cuff of right shoulder, initial encounter: Secondary | ICD-10-CM

## 2022-03-19 DIAGNOSIS — G8929 Other chronic pain: Secondary | ICD-10-CM | POA: Diagnosis not present

## 2022-03-19 DIAGNOSIS — M25511 Pain in right shoulder: Secondary | ICD-10-CM | POA: Diagnosis not present

## 2022-03-19 NOTE — Progress Notes (Deleted)
Aleen Sells D.[facility] [address] [place] [address] Phone: [phone]   Assessment and Plan:     There are no diagnoses linked to this encounter.  ***   Pertinent previous records reviewed include ***   Follow Up: ***     Subjective:   I, Safari Cinque, am serving as a Neurosurgeon for Doctor Richardean Sale   Chief Complaint: right bicep pain    HPI:    03/12/2022 Patient is a 71 year old male complaining of right bicep pain. 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   Current Outpatient Medications:    aspirin EC 81 MG tablet, Take 81 mg daily by mouth., Disp: , Rfl:    atorvastatin (LIPITOR) 20 MG tablet, Take 1 tablet (20 mg total) by mouth daily., Disp: 90 tablet, Rfl: 3   Blood Pressure Monitoring (BLOOD PRESSURE KIT) DEVI, 1 kit by Does not apply route daily., Disp: 1 each, Rfl: 0   brimonidine (ALPHAGAN) 0.2 % ophthalmic solution, SMARTSIG:In Eye(s), Disp: , Rfl:    Calcium Carbonate-Vitamin D (CALTRATE 600+D) 600-400 MG-UNIT per tablet, Take 1 tablet by mouth 2 (two) times daily., Disp: 180 tablet, Rfl: 3   Cholecalciferol (VITAMIN D) 50 MCG (2000 UT) tablet, Take 2,000 Units by mouth daily., Disp: , Rfl:    Continuous Blood Gluc Receiver (DEXCOM G6 RECEIVER) DEVI, Use as directed to check blood sugar., Disp: 1 each, Rfl: 0   Continuous Blood Gluc Sensor (DEXCOM G6 SENSOR) MISC, Change every 10 days, Disp: 9 each, Rfl: 3   Continuous Blood Gluc Transmit (DEXCOM G6 TRANSMITTER) MISC, Change every 3 months, Disp: 1 each, Rfl: 3   dapagliflozin propanediol (FARXIGA) 10 MG TABS tablet, Take 10 mg by mouth daily., Disp: , Rfl:     hydrochlorothiazide (MICROZIDE) 12.5 MG capsule, Take 1 capsule (12.5 mg total) by mouth daily., Disp: 90 capsule, Rfl: 3   Insulin Disposable Pump (OMNIPOD 5 G6 POD, GEN 5,) MISC, 1 Device by Does not apply route every 3 (three) days., Disp: 2 each, Rfl: 3   insulin glargine, 1 Unit Dial, (TOUJEO SOLOSTAR) 300 UNIT/ML Solostar Pen, Inject 35 Units into the skin daily. (Patient not taking: Reported on 03/04/2022), Disp: 6 mL, Rfl: 3   insulin lispro (HUMALOG) 100 UNIT/ML injection, Inject 50 units in pump daily, Disp: 20 mL, Rfl: 2   Insulin Pen Needle (PEN NEEDLES) 32G X 4 MM MISC, Use as Directed 3 times daily to inject insulin, Disp: 100 each, Rfl: 5   Lancets (ONETOUCH DELICA PLUS LANCET33G) MISC, CHECK BLOOD SUGAR TWICE  DAILY, Disp: 200 each, Rfl: 2   latanoprost (XALATAN) 0.005 % ophthalmic solution, Place 1 drop into both eyes at bedtime., Disp: , Rfl:    levothyroxine (SYNTHROID) 25 MCG tablet, Take 1 tablet (25 mcg total) by mouth daily before breakfast., Disp: 90 tablet, Rfl: 3   LINZESS 145 MCG CAPS capsule, TAKE 1 CAPSULE BY MOUTH DAILY  BEFORE BREAKFAST, Disp: 90 capsule, Rfl: 3   meloxicam (MOBIC) 15 MG tablet, Take 1 tablet (15 mg total) by mouth daily., Disp: 14 tablet, Rfl: 0   meloxicam (MOBIC) 7.5 MG tablet, Take 7.5  mg by mouth daily., Disp: , Rfl:    metoprolol succinate (TOPROL-XL) 25 MG 24 hr tablet, TAKE 1 TABLET BY MOUTH ONCE  DAILY, Disp: 90 tablet, Rfl: 0   Multiple Vitamin (MULTIVITAMIN WITH MINERALS) TABS tablet, Take 1 tablet by mouth daily., Disp: , Rfl:    Omega-3 Fatty Acids (FISH OIL) 300 MG CAPS, Take 1 capsule (300 mg total) by mouth daily., Disp: 90 capsule, Rfl: 3   ONETOUCH VERIO test strip, TEST TWICE DAILY, Disp: 200 strip, Rfl: 3   pioglitazone (ACTOS) 30 MG tablet, Take 1 tablet (30 mg total) by mouth daily., Disp: 90 tablet, Rfl: 3   polyethylene glycol powder (GLYCOLAX/MIRALAX) 17 GM/SCOOP powder, Take 17 g by mouth 2 (two) times daily as needed., Disp:  255 g, Rfl: 0   potassium chloride SA (KLOR-CON M) 20 MEQ tablet, TAKE 1 TABLET BY MOUTH  TWICE DAILY (Patient taking differently: Take 10 mEq by mouth 2 (two) times daily.), Disp: 180 tablet, Rfl: 2   RYBELSUS 14 MG TABS, TAKE 1 TABLET BY MOUTH DAILY, Disp: 90 tablet, Rfl: 3   triamcinolone ointment (KENALOG) 0.5 %, APPLY TO AFFECTED AREA(S)  TOPICALLY TWICE DAILY, Disp: 90 g, Rfl: 0   Objective:     There were no vitals filed for this visit.    There is no height or weight on file to calculate BMI.    Physical Exam:    ***   Electronically signed by:  Benito Mccreedy D.[facility] 10:44 AM 03/19/22

## 2022-03-19 NOTE — Therapy (Signed)
OUTPATIENT PHYSICAL THERAPY TREATMENT NOTE   Patient Name: Michael Snyder MRN: [id number] DOB:03-15-1951, 71 y.o., male Today's Date: 03/19/2022  PCP: Hoyt Koch, MD REFERRING PROVIDER: Glennon Mac, DO  END OF SESSION:   PT End of Session - 03/19/22 1044     Visit Number 3    Number of Visits 12    Date for PT Re-Evaluation 05/05/22    Authorization Type UHC    PT Start Time [id number]    PT Stop Time 1125    PT Time Calculation (min) 40 min    Activity Tolerance Patient tolerated treatment well;Patient limited by pain    Behavior During Therapy WFL for tasks assessed/performed             Past Medical History:  Diagnosis Date   Alcohol abuse    Arthritis    Diabetes mellitus, type 2 ([place])    Head injury, closed, without LOC 1980s   History of alcohol abuse    Hyperlipidemia    Hypertension    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   Pacemaker    Medtronic Adapta ADDR01   Seizure disorder ([place])    Seizures ([place])    last time in 1990's   Past Surgical History:  Procedure Laterality Date   LACERATION REPAIR  1980   scalp   PACEMAKER LEAD REMOVAL N/A 03/23/2014   Procedure: PACEMAKER LEAD REMOVAL;  Surgeon: Evans Lance, MD;  Location: [facility];  Service: Cardiovascular;  Laterality: N/A;   PTDVP     7/08 medtronic DDD   Patient Active Problem List   Diagnosis Date Noted   Acute pain of right shoulder 02/24/2022   Stage 3b chronic kidney disease ([place]) 09/12/2021   Hypothyroidism 08/03/2020   Constipation 07/18/2020   De Quervain's tenosynovitis, left 03/14/2019   Left wrist pain 09/23/2018   Hepatitis C virus infection cured after antiviral drug therapy 12/24/2015   Cataract, right eye 11/13/2014   Cataract, left eye [id number]   Complications, pacemaker cardiac, 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/2014   Atrial fibrillation ([place]) 11/17/2012   Cardiac pacemaker MDT 11/26/2011   Routine health maintenance 06/16/2011   Essential hypertension 06/01/2008    Diabetes mellitus type 2 with complications ([place]) [id number]   SICK SINUS SYNDROME 03/26/2007   SEIZURE DISORDER 03/26/2007    REFERRING DIAG: M25.511,G89.29 (ICD-10-CM) - Chronic right shoulder pain S46.011A (ICD-10-CM) - Rotator cuff strain, right, initial encounter   THERAPY DIAG: Rotator cuff strain, right, initial encounter    Rationale for Evaluation and Treatment Rehabilitation  PERTINENT HISTORY: Chronic right shoulder pain 2. Rotator cuff strain, right, initial encounter -Chronic with exacerbation, initial sports medicine visit - Acutely worsening right shoulder pain after incident with patient moving furniture with history of intermittent shoulder pain - Consistent with rotator cuff pathology.  I do not think the patient has a complete tear of any rotator cuff muscle based on maintained strength, however he may have large degree of partial tears versus grade 1 strain - Offered subacromial CSI, however patient stated that prior CSI significantly raise his blood glucose with history of DM type II, so patient wishes to avoid CSI - Start meloxicam 15 mg daily x2 weeks.  If still having pain after 2 weeks, Do not to use additional NSAIDs while taking meloxicam.  May use Tylenol [phone] mg 2 to 3 times a day for breakthrough pain.  We will only use meloxicam for a short 2-week course and no longer due to patient's comorbidities - Start  HEP and physical therapy for shoulder and rotator cuff - Ambulatory referral to Physical Therapy -X-ray obtained in clinic.  My interpretation: No acute fracture or dislocation.  Hyper sclerosis and glenoid and AC joint arthritis  PRECAUTIONS: R biceps tendon tear, non-functional L shoulder due to trauma  SUBJECTIVE: Patient reports that high pain is the worst at night and it gets "past a 10." He states he has been doing some work (describes home improvement type projects like painting) and has difficulty with OH motions.   PAIN:  Are you having pain?  Yes: NPRS scale: 8/10 Pain location: R shoulder Pain description: ache Aggravating factors: OH reaching Relieving factors: OTC ointment   OBJECTIVE: (objective measures completed at initial evaluation unless otherwise dated)  DIAGNOSTIC FINDINGS:  X-ray obtained in clinic.  My interpretation: No acute fracture or dislocation.  Hyper sclerosis and glenoid and AC joint arthritis   PATIENT SURVEYS:  FOTO 36(65 predicted)   COGNITION:           Overall cognitive status: Within functional limits for tasks assessed                                  SENSATION: Not tested   POSTURE: Rounded shoulders    UPPER EXTREMITY ROM:    A/PROM Right eval Left eval  Shoulder flexion 90/120    Shoulder extension      Shoulder abduction 90/150    Shoulder adduction      Shoulder internal rotation 70/80    Shoulder external rotation 60/70    Elbow flexion WFL    Elbow extension WFL    Wrist flexion      Wrist extension      Wrist ulnar deviation      Wrist radial deviation      Wrist pronation      Wrist supination      (Blank rows = not tested)   UPPER EXTREMITY MMT:   MMT Right eval Left eval  Shoulder flexion 4p!    Shoulder extension 5    Shoulder abduction 4p!    Shoulder adduction      Shoulder internal rotation 4    Shoulder external rotation 4    Middle trapezius      Lower trapezius      Elbow flexion 4-p!    Elbow extension 4    Wrist flexion      Wrist extension      Wrist ulnar deviation      Wrist radial deviation      Wrist pronation      Wrist supination      Grip strength (lbs)      (Blank rows = not tested)   SHOULDER SPECIAL TESTS:            Impingement tests: Neer impingement test: positive  and Hawkins/Kennedy impingement test: positive             Rotator cuff assessment: Drop arm test: negative, Empty can test: positive , and Full can test: positive             Biceps assessment:  R "Popeye" deformity   JOINT MOBILITY TESTING:  Not tested    PALPATION:  TTP R biceps tendon proximal             TODAY'S TREATMENT:  [facility] Adult PT Treatment:  DATE: 03/19/2022 Therapeutic Exercise: Supine elbow flexion R 500g ball 2x10 Supine press R 500g ball 2x10 Supine flexion R 500g ball 15x L sidelie R ER 1000g 2x10 L sidelie abduction 1000g ball 2x10 Prone flexion 1000g ball 15x Prone extenison 1000g ball 15x Prone hor abd 1000g ball 15x Seated scapular retraction 3x10 Manual Therapy: PROM R shoulder Inf GH glides 5x10 ea.  [facility] Adult PT Treatment:                                                DATE: 03/16/22 Therapeutic Exercise: Supine elbow flexion R 500g ball 15x Supine press R 500g ball 15x Supine flexion R 500g ball 15x L sidelie R ER 500g 15x L sidelie abduction 500g ball 15x Prone flexion 500g ball 15x Prone extenison 500g ball 15x Prone hor abd 500g ball 15x Prone scaption 500g ball 15x Manual Therapy: PROM R shoulder D1 F/E PNF 15x Post and inf GH glides 5x10 ea.      PATIENT EDUCATION: Education details: Discussed eval findings, rehab rationale and POC and patient is in agreement  Person educated: Patient Education method: Explanation Education comprehension: verbalized understanding and needs further education     HOME EXERCISE PROGRAM: Access Code: [email or web address] URL: [email or web address] Date: 03/10/2022 Prepared by: Gustavus Bryant   Exercises - Supine Shoulder Press with Dowel  - 2 x daily - 5 x weekly - 3 sets - 10 reps - Supine Shoulder External Rotation with Dowel  - 2 x daily - 5 x weekly - 3 sets - 10 reps - Shoulder Flexion Wall Slide with Towel  - 2 x daily - 5 x weekly - 3 sets - 10 reps   ASSESSMENT:   CLINICAL IMPRESSION: Patient reports that he is having a lot of pain in his shoulder today and that it is worse at night. He states he was doing some house painting today and has difficulty with the overhead motions. Session today  focused on AROM and strengthening of R shoulder with use of manual techniques to decrease tension and tissue restriction. Increased resistance and repetitions as noted. Patient continues to benefit from skilled PT services and should be progressed as able to improve functional independence.     OBJECTIVE IMPAIRMENTS decreased knowledge of condition, decreased ROM, decreased strength, impaired UE functional use, postural dysfunction, and pain.    ACTIVITY LIMITATIONS carrying, lifting, dressing, and reach over head   PERSONAL FACTORS Age, Fitness, and 1 comorbidity: R biceps tendon rupture are also affecting patient's functional outcome.    REHAB POTENTIAL: Good   CLINICAL DECISION MAKING: Stable/uncomplicated   EVALUATION COMPLEXITY: Low     GOALS: Goals reviewed with patient? Yes   SHORT TERM GOALS: Target date: 03/31/22     Patient to demonstrate independence in HEP  Baseline:[email or web address] Goal status: INITIAL   2.  Decrease worst pain to 8/10 Baseline: 10/10 Goal status: INITIAL   3.  Increase AROM of flexion and abduction to 120d Baseline: 90d each motion Goal status: INITIAL     LONG TERM GOALS: Target date: 04/21/2022     160d AROM flexion an abduction Baseline: 90d each Goal status: INITIAL   2.  4+/5 R shoulder/elbow strength Baseline:  MMT Right eval Left eval  Shoulder flexion 4p!    Shoulder extension 5    Shoulder abduction 4p!    Shoulder adduction  Shoulder internal rotation 4    Shoulder external rotation 4    Middle trapezius      Lower trapezius      Elbow flexion 4-p!    Elbow extension 4      Goal status: INITIAL   3.  Increase FOTO to 65 Baseline: 36  Goal status: INITIAL   4.  4/10 worst pain Baseline: 10/10 Goal status: INITIAL       PLAN: PT FREQUENCY: 1-2x/week   PT DURATION: 6 weeks   PLANNED INTERVENTIONS: Therapeutic exercises, Therapeutic activity, Neuromuscular re-education, Balance training, Gait training,  Patient/Family education, Self Care, Joint mobilization, Manual therapy, and Re-evaluation   PLAN FOR NEXT SESSION: ROM, RC and shoulder strengthening, posture retraining, functional tasks, HEP review and update.    Margarette Canada, PTA 03/19/2022, 11:24 AM

## 2022-03-20 ENCOUNTER — Ambulatory Visit: Payer: Medicare Other | Admitting: Sports Medicine

## 2022-03-23 ENCOUNTER — Ambulatory Visit: Payer: Medicare Other

## 2022-03-23 DIAGNOSIS — M25511 Pain in right shoulder: Secondary | ICD-10-CM

## 2022-03-23 DIAGNOSIS — S46011A Strain of muscle(s) and tendon(s) of the rotator cuff of right shoulder, initial encounter: Secondary | ICD-10-CM

## 2022-03-23 DIAGNOSIS — G8929 Other chronic pain: Secondary | ICD-10-CM | POA: Diagnosis not present

## 2022-03-23 NOTE — Progress Notes (Signed)
Remote pacemaker transmission.   

## 2022-03-23 NOTE — Therapy (Signed)
OUTPATIENT PHYSICAL THERAPY TREATMENT NOTE   Patient Name: Michael Snyder MRN: [id number] DOB:12-22-1950, 71 y.o., male Today's Date: 03/23/2022  PCP: Hoyt Koch, MD REFERRING PROVIDER: Glennon Mac, DO  END OF SESSION:   PT End of Session - 03/23/22 0835     Visit Number 4    Number of Visits 12    Date for PT Re-Evaluation 05/05/22    Authorization Type UHC    PT Start Time 0831    PT Stop Time 0910    PT Time Calculation (min) 39 min    Activity Tolerance Patient tolerated treatment well;Patient limited by pain    Behavior During Therapy WFL for tasks assessed/performed              Past Medical History:  Diagnosis Date   Alcohol abuse    Arthritis    Diabetes mellitus, type 2 ([place])    Head injury, closed, without LOC 1980s   History of alcohol abuse    Hyperlipidemia    Hypertension    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   Pacemaker    Medtronic Adapta [id number]   Seizure disorder ([place])    Seizures ([place])    last time in 1990's   Past Surgical History:  Procedure Laterality Date   LACERATION REPAIR  1980   scalp   PACEMAKER LEAD REMOVAL N/A 03/23/2014   Procedure: PACEMAKER LEAD REMOVAL;  Surgeon: Evans Lance, MD;  Location: [facility];  Service: Cardiovascular;  Laterality: N/A;   PTDVP     7/08 medtronic DDD   Patient Active Problem List   Diagnosis Date Noted   Acute pain of right shoulder 02/24/2022   Stage 3b chronic kidney disease ([place]) 09/12/2021   Hypothyroidism 08/03/2020   Constipation 07/18/2020   De Quervain's tenosynovitis, left 03/14/2019   Left wrist pain 09/23/2018   Hepatitis C virus infection cured after antiviral drug therapy 12/24/2015   Cataract, right eye 11/13/2014   Cataract, left eye [id number]   Complications, pacemaker cardiac, 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/2014   Atrial fibrillation ([place]) 11/17/2012   Cardiac pacemaker MDT 11/26/2011   Routine health maintenance 06/16/2011   Essential hypertension 06/01/2008    Diabetes mellitus type 2 with complications ([place]) [id number]   SICK SINUS SYNDROME 03/26/2007   SEIZURE DISORDER 03/26/2007    REFERRING DIAG: M25.511,G89.29 (ICD-10-CM) - Chronic right shoulder pain S46.011A (ICD-10-CM) - Rotator cuff strain, right, initial encounter   THERAPY DIAG: Rotator cuff strain, right, initial encounter    Rationale for Evaluation and Treatment Rehabilitation  PERTINENT HISTORY: Chronic right shoulder pain 2. Rotator cuff strain, right, initial encounter -Chronic with exacerbation, initial sports medicine visit - Acutely worsening right shoulder pain after incident with patient moving furniture with history of intermittent shoulder pain - Consistent with rotator cuff pathology.  I do not think the patient has a complete tear of any rotator cuff muscle based on maintained strength, however he may have large degree of partial tears versus grade 1 strain - Offered subacromial CSI, however patient stated that prior CSI significantly raise his blood glucose with history of DM type II, so patient wishes to avoid CSI - Start meloxicam 15 mg daily x2 weeks.  If still having pain after 2 weeks, Do not to use additional NSAIDs while taking meloxicam.  May use Tylenol [phone] mg 2 to 3 times a day for breakthrough pain.  We will only use meloxicam for a short 2-week course and no longer due to patient's comorbidities -  Start HEP and physical therapy for shoulder and rotator cuff - Ambulatory referral to Physical Therapy -X-ray obtained in clinic.  My interpretation: No acute fracture or dislocation.  Hyper sclerosis and glenoid and AC joint arthritis  PRECAUTIONS: R biceps tendon tear, non-functional L shoulder due to trauma  SUBJECTIVE: Patient reports that high pain is the worst at night and it gets "past a 10." He states he has been doing some work (describes home improvement type projects like painting) and has difficulty with OH motions.   PAIN:  Are you having pain?  Yes: NPRS scale: 8/10 Pain location: R shoulder Pain description: ache Aggravating factors: OH reaching Relieving factors: OTC ointment   OBJECTIVE: (objective measures completed at initial evaluation unless otherwise dated)  DIAGNOSTIC FINDINGS:  X-ray obtained in clinic.  My interpretation: No acute fracture or dislocation.  Hyper sclerosis and glenoid and AC joint arthritis   PATIENT SURVEYS:  FOTO 36(65 predicted)   COGNITION:           Overall cognitive status: Within functional limits for tasks assessed                                  SENSATION: Not tested   POSTURE: Rounded shoulders    UPPER EXTREMITY ROM:    A/PROM Right eval Left eval  Shoulder flexion 90/120    Shoulder extension      Shoulder abduction 90/150    Shoulder adduction      Shoulder internal rotation 70/80    Shoulder external rotation 60/70    Elbow flexion WFL    Elbow extension WFL    Wrist flexion      Wrist extension      Wrist ulnar deviation      Wrist radial deviation      Wrist pronation      Wrist supination      (Blank rows = not tested)   UPPER EXTREMITY MMT:   MMT Right eval Left eval  Shoulder flexion 4p!    Shoulder extension 5    Shoulder abduction 4p!    Shoulder adduction      Shoulder internal rotation 4    Shoulder external rotation 4    Middle trapezius      Lower trapezius      Elbow flexion 4-p!    Elbow extension 4    Wrist flexion      Wrist extension      Wrist ulnar deviation      Wrist radial deviation      Wrist pronation      Wrist supination      Grip strength (lbs)      (Blank rows = not tested)   SHOULDER SPECIAL TESTS:            Impingement tests: Neer impingement test: positive  and Hawkins/Kennedy impingement test: positive             Rotator cuff assessment: Drop arm test: negative, Empty can test: positive , and Full can test: positive             Biceps assessment:  R "Popeye" deformity   JOINT MOBILITY TESTING:  Not tested    PALPATION:  TTP R biceps tendon proximal             TODAY'S TREATMENT:  [facility] Adult PT Treatment:  DATE: 03/23/22 Therapeutic Exercise: Supine elbow flexion R 1000g ball 15x(supinated) Supine elbow flexion 1000g ball 15x(pronated) Supine press R 1000g ball 15x Supine flexion R 1000g ball 15x L sidelie R ER 1000g 15x L sidelie abduction 1000g ball 15x Prone flexion 1000g ball 15x Prone extenison 1000g ball 15x Prone hor abd 1000g ball 15x UBE RUE only L1 4 min Manual Therapy: Rhythmic stabilization at 90d flexion 60s x2 Supine PNF D1 F/E 15x L sidelie 4 way scapula 15x each  [facility] Adult PT Treatment:                                                DATE: 03/19/2022 Therapeutic Exercise: Supine elbow flexion R 500g ball 2x10 Supine press R 500g ball 2x10 Supine flexion R 500g ball 15x L sidelie R ER 1000g 2x10 L sidelie abduction 1000g ball 2x10 Prone flexion 1000g ball 15x Prone extenison 1000g ball 15x Prone hor abd 1000g ball 15x Seated scapular retraction 3x10 Manual Therapy: PROM R shoulder Inf GH glides 5x10 ea.  [facility] Adult PT Treatment:                                                DATE: 03/16/22 Therapeutic Exercise: Supine elbow flexion R 500g ball 15x Supine press R 500g ball 15x Supine flexion R 500g ball 15x L sidelie R ER 500g 15x L sidelie abduction 500g ball 15x Prone flexion 500g ball 15x Prone extenison 500g ball 15x Prone hor abd 500g ball 15x Prone scaption 500g ball 15x Manual Therapy: PROM R shoulder D1 F/E PNF 15x Post and inf GH glides 5x10 ea.      PATIENT EDUCATION: Education details: Discussed eval findings, rehab rationale and POC and patient is in agreement  Person educated: Patient Education method: Explanation Education comprehension: verbalized understanding and needs further education     HOME EXERCISE PROGRAM: Access Code: [email or web address] URL: [email or web address] Date:  03/10/2022 Prepared by: Sharlynn Oliphant   Exercises - Supine Shoulder Press with Dowel  - 2 x daily - 5 x weekly - 3 sets - 10 reps - Supine Shoulder External Rotation with Dowel  - 2 x daily - 5 x weekly - 3 sets - 10 reps - Shoulder Flexion Wall Slide with Towel  - 2 x daily - 5 x weekly - 3 sets - 10 reps   ASSESSMENT:   CLINICAL IMPRESSION: Pain mostly noted with sleep positions.  Today's session increased resistance on certain exercises but decreased reps accordingly.  Initiated UBE with RUE only which patient was able to tolerate.  Shoulder symptoms improving slowly with main irritation being sleep positions, as expected with underlying biceps tendon tear.     OBJECTIVE IMPAIRMENTS decreased knowledge of condition, decreased ROM, decreased strength, impaired UE functional use, postural dysfunction, and pain.    ACTIVITY LIMITATIONS carrying, lifting, dressing, and reach over head   PERSONAL FACTORS Age, Fitness, and 1 comorbidity: R biceps tendon rupture are also affecting patient's functional outcome.    REHAB POTENTIAL: Good   CLINICAL DECISION MAKING: Stable/uncomplicated   EVALUATION COMPLEXITY: Low     GOALS: Goals reviewed with patient? Yes   SHORT TERM GOALS: Target date: 03/31/22     Patient to demonstrate independence in  HEP  Baseline:[email or web address] Goal status: INITIAL   2.  Decrease worst pain to 8/10 Baseline: 10/10 Goal status: INITIAL   3.  Increase AROM of flexion and abduction to 120d Baseline: 90d each motion Goal status: INITIAL     LONG TERM GOALS: Target date: 04/21/2022     160d AROM flexion an abduction Baseline: 90d each Goal status: INITIAL   2.  4+/5 R shoulder/elbow strength Baseline:  MMT Right eval Left eval  Shoulder flexion 4p!    Shoulder extension 5    Shoulder abduction 4p!    Shoulder adduction      Shoulder internal rotation 4    Shoulder external rotation 4    Middle trapezius      Lower trapezius      Elbow flexion  4-p!    Elbow extension 4      Goal status: INITIAL   3.  Increase FOTO to 65 Baseline: 36  Goal status: INITIAL   4.  4/10 worst pain Baseline: 10/10 Goal status: INITIAL       PLAN: PT FREQUENCY: 1-2x/week   PT DURATION: 6 weeks   PLANNED INTERVENTIONS: Therapeutic exercises, Therapeutic activity, Neuromuscular re-education, Balance training, Gait training, Patient/Family education, Self Care, Joint mobilization, Manual therapy, and Re-evaluation   PLAN FOR NEXT SESSION: ROM, RC and shoulder strengthening, posture retraining, functional tasks, HEP review and update.    Lanice Shirts, PT 03/23/2022, 9:13 AM

## 2022-03-26 ENCOUNTER — Ambulatory Visit: Payer: Medicare Other

## 2022-03-26 DIAGNOSIS — M25511 Pain in right shoulder: Secondary | ICD-10-CM | POA: Diagnosis not present

## 2022-03-26 DIAGNOSIS — S46011A Strain of muscle(s) and tendon(s) of the rotator cuff of right shoulder, initial encounter: Secondary | ICD-10-CM | POA: Diagnosis not present

## 2022-03-26 DIAGNOSIS — G8929 Other chronic pain: Secondary | ICD-10-CM | POA: Diagnosis not present

## 2022-03-26 NOTE — Therapy (Signed)
OUTPATIENT PHYSICAL THERAPY TREATMENT NOTE   Patient Name: Michael Snyder MRN: [id number] DOB:[birth year], 71 y.o., male Today's Date: 03/26/2022  PCP: Hoyt Koch, MD REFERRING PROVIDER: Glennon Mac, DO  END OF SESSION:   PT End of Session - 03/26/22 1001     Visit Number 5    Number of Visits 12    Date for PT Re-Evaluation 05/05/22    Authorization Type UHC    PT Start Time 1001    PT Stop Time [id number]    PT Time Calculation (min) 38 min    Activity Tolerance Patient tolerated treatment well;Patient limited by pain    Behavior During Therapy WFL for tasks assessed/performed               Past Medical History:  Diagnosis Date   Alcohol abuse    Arthritis    Diabetes mellitus, type 2 ([place])    Head injury, closed, without LOC 1980s   History of alcohol abuse    Hyperlipidemia    Hypertension    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   Pacemaker    Medtronic Adapta ADDR01   Seizure disorder ([place])    Seizures ([place])    last time in 1990's   Past Surgical History:  Procedure Laterality Date   LACERATION REPAIR  1980   scalp   PACEMAKER LEAD REMOVAL N/A 03/23/2014   Procedure: PACEMAKER LEAD REMOVAL;  Surgeon: Evans Lance, MD;  Location: [facility];  Service: Cardiovascular;  Laterality: N/A;   PTDVP     7/08 medtronic DDD   Patient Active Problem List   Diagnosis Date Noted   Acute pain of right shoulder 02/24/2022   Stage 3b chronic kidney disease ([place]) 09/12/2021   Hypothyroidism 08/03/2020   Constipation 07/18/2020   De Quervain's tenosynovitis, left 03/14/2019   Left wrist pain 09/23/2018   Hepatitis C virus infection cured after antiviral drug therapy 12/24/2015   Cataract, right eye 11/13/2014   Cataract, left eye [id number]   Complications, pacemaker cardiac, 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/2014   Atrial fibrillation ([place]) 11/17/2012   Cardiac pacemaker MDT 11/26/2011   Routine health maintenance 06/16/2011   Essential hypertension 06/01/2008    Diabetes mellitus type 2 with complications ([place]) [id number]   SICK SINUS SYNDROME 03/26/2007   SEIZURE DISORDER 03/26/2007    REFERRING DIAG: M25.511,G89.29 (ICD-10-CM) - Chronic right shoulder pain S46.011A (ICD-10-CM) - Rotator cuff strain, right, initial encounter   THERAPY DIAG: Rotator cuff strain, right, initial encounter    Rationale for Evaluation and Treatment Rehabilitation  PERTINENT HISTORY: Chronic right shoulder pain 2. Rotator cuff strain, right, initial encounter -Chronic with exacerbation, initial sports medicine visit - Acutely worsening right shoulder pain after incident with patient moving furniture with history of intermittent shoulder pain - Consistent with rotator cuff pathology.  I do not think the patient has a complete tear of any rotator cuff muscle based on maintained strength, however he may have large degree of partial tears versus grade 1 strain - Offered subacromial CSI, however patient stated that prior CSI significantly raise his blood glucose with history of DM type II, so patient wishes to avoid CSI - Start meloxicam 15 mg daily x2 weeks.  If still having pain after 2 weeks, Do not to use additional NSAIDs while taking meloxicam.  May use Tylenol [phone] mg 2 to 3 times a day for breakthrough pain.  We will only use meloxicam for a short 2-week course and no longer due to patient's comorbidities -  Start HEP and physical therapy for shoulder and rotator cuff - Ambulatory referral to Physical Therapy -X-ray obtained in clinic.  My interpretation: No acute fracture or dislocation.  Hyper sclerosis and glenoid and AC joint arthritis  PRECAUTIONS: R biceps tendon tear, non-functional L shoulder due to trauma  SUBJECTIVE: Patient reports sleeping is still difficult, he states his pain gets above a 10 and has a hard time getting comfortable.  PAIN:  Are you having pain? Yes: NPRS scale: 6/10 Pain location: R shoulder Pain description: ache Aggravating  factors: OH reaching Relieving factors: OTC ointment   OBJECTIVE: (objective measures completed at initial evaluation unless otherwise dated)  DIAGNOSTIC FINDINGS:  X-ray obtained in clinic.  My interpretation: No acute fracture or dislocation.  Hyper sclerosis and glenoid and AC joint arthritis   PATIENT SURVEYS:  FOTO 36(65 predicted)   COGNITION:           Overall cognitive status: Within functional limits for tasks assessed                                  SENSATION: Not tested   POSTURE: Rounded shoulders    UPPER EXTREMITY ROM:    A/PROM Right eval Left eval  Shoulder flexion 90/120    Shoulder extension      Shoulder abduction 90/150    Shoulder adduction      Shoulder internal rotation 70/80    Shoulder external rotation 60/70    Elbow flexion WFL    Elbow extension WFL    Wrist flexion      Wrist extension      Wrist ulnar deviation      Wrist radial deviation      Wrist pronation      Wrist supination      (Blank rows = not tested)   UPPER EXTREMITY MMT:   MMT Right eval Left eval  Shoulder flexion 4p!    Shoulder extension 5    Shoulder abduction 4p!    Shoulder adduction      Shoulder internal rotation 4    Shoulder external rotation 4    Middle trapezius      Lower trapezius      Elbow flexion 4-p!    Elbow extension 4    Wrist flexion      Wrist extension      Wrist ulnar deviation      Wrist radial deviation      Wrist pronation      Wrist supination      Grip strength (lbs)      (Blank rows = not tested)   SHOULDER SPECIAL TESTS:            Impingement tests: Neer impingement test: positive  and Hawkins/Kennedy impingement test: positive             Rotator cuff assessment: Drop arm test: negative, Empty can test: positive , and Full can test: positive             Biceps assessment:  R "Popeye" deformity   JOINT MOBILITY TESTING:  Not tested   PALPATION:  TTP R biceps tendon proximal             TODAY'S TREATMENT:  [facility]  Adult PT Treatment:  DATE: 03/26/2022 Therapeutic Exercise: Supine elbow flexion R 1000g ball 2x10(supinated) Supine elbow flexion 1000g ball 2x10(pronated) Supine press R 1000g ball 2x10 Supine flexion R 1000g ball 2x10 L sidelie R ER 1000g 2x10 L sidelie abduction 1000g ball 2x10 Prone flexion 1000g ball 15x Prone extension 1000g ball 2x10 Prone hor abd 1000g ball 15x UBE RUE only L1 4 min Manual Therapy: Rhythmic stabilization at 90d flexion 60s x2 PROM R shoulder  [facility] Adult PT Treatment:                                                DATE: 03/23/22 Therapeutic Exercise: Supine elbow flexion R 1000g ball 15x(supinated) Supine elbow flexion 1000g ball 15x(pronated) Supine press R 1000g ball 15x Supine flexion R 1000g ball 15x L sidelie R ER 1000g 15x L sidelie abduction 1000g ball 15x Prone flexion 1000g ball 15x Prone extenison 1000g ball 15x Prone hor abd 1000g ball 15x UBE RUE only L1 4 min Manual Therapy: Rhythmic stabilization at 90d flexion 60s x2 Supine PNF D1 F/E 15x L sidelie 4 way scapula 15x each  [facility] Adult PT Treatment:                                                DATE: 03/19/2022 Therapeutic Exercise: Supine elbow flexion R 500g ball 2x10 Supine press R 500g ball 2x10 Supine flexion R 500g ball 15x L sidelie R ER 1000g 2x10 L sidelie abduction 1000g ball 2x10 Prone flexion 1000g ball 15x Prone extenison 1000g ball 15x Prone hor abd 1000g ball 15x Seated scapular retraction 3x10 Manual Therapy: PROM R shoulder Inf GH glides 5x10 ea.    PATIENT EDUCATION: Education details: Discussed eval findings, rehab rationale and POC and patient is in agreement  Person educated: Patient Education method: Explanation Education comprehension: verbalized understanding and needs further education     HOME EXERCISE PROGRAM: Access Code: [email or web address] URL: [email or web address] Date: 03/10/2022 Prepared by:  Gustavus Bryant   Exercises - Supine Shoulder Press with Dowel  - 2 x daily - 5 x weekly - 3 sets - 10 reps - Supine Shoulder External Rotation with Dowel  - 2 x daily - 5 x weekly - 3 sets - 10 reps - Shoulder Flexion Wall Slide with Towel  - 2 x daily - 5 x weekly - 3 sets - 10 reps   ASSESSMENT:   CLINICAL IMPRESSION: Patient presents to PT with continued reports of R shoulder pain, especially with OH motions and sleeping is still the most painful. Session today focused on RTC and periscapular strengthening, increasing repetitions as noted above. Patient was able to tolerate all prescribed exercises with no adverse effects. Patient continues to benefit from skilled PT services and should be progressed as able to improve functional independence.    OBJECTIVE IMPAIRMENTS decreased knowledge of condition, decreased ROM, decreased strength, impaired UE functional use, postural dysfunction, and pain.    ACTIVITY LIMITATIONS carrying, lifting, dressing, and reach over head   PERSONAL FACTORS Age, Fitness, and 1 comorbidity: R biceps tendon rupture are also affecting patient's functional outcome.    REHAB POTENTIAL: Good   CLINICAL DECISION MAKING: Stable/uncomplicated   EVALUATION COMPLEXITY: Low     GOALS: Goals reviewed with patient? Yes  SHORT TERM GOALS: Target date: 03/31/22     Patient to demonstrate independence in HEP  Baseline:[email or web address] Goal status: INITIAL   2.  Decrease worst pain to 8/10 Baseline: 10/10 Goal status: INITIAL   3.  Increase AROM of flexion and abduction to 120d Baseline: 90d each motion Goal status: INITIAL     LONG TERM GOALS: Target date: 04/21/2022     160d AROM flexion an abduction Baseline: 90d each Goal status: INITIAL   2.  4+/5 R shoulder/elbow strength Baseline:  MMT Right eval Left eval  Shoulder flexion 4p!    Shoulder extension 5    Shoulder abduction 4p!    Shoulder adduction      Shoulder internal rotation 4    Shoulder  external rotation 4    Middle trapezius      Lower trapezius      Elbow flexion 4-p!    Elbow extension 4      Goal status: INITIAL   3.  Increase FOTO to 65 Baseline: 36  Goal status: INITIAL   4.  4/10 worst pain Baseline: 10/10 Goal status: INITIAL       PLAN: PT FREQUENCY: 1-2x/week   PT DURATION: 6 weeks   PLANNED INTERVENTIONS: Therapeutic exercises, Therapeutic activity, Neuromuscular re-education, Balance training, Gait training, Patient/Family education, Self Care, Joint mobilization, Manual therapy, and Re-evaluation   PLAN FOR NEXT SESSION: ROM, RC and shoulder strengthening, posture retraining, functional tasks, HEP review and update.    Margarette Canada, PTA 03/26/2022, 10:39 AM

## 2022-03-27 ENCOUNTER — Ambulatory Visit: Payer: Medicare Other | Admitting: Sports Medicine

## 2022-03-30 ENCOUNTER — Ambulatory Visit: Payer: Medicare Other

## 2022-03-30 DIAGNOSIS — S46011A Strain of muscle(s) and tendon(s) of the rotator cuff of right shoulder, initial encounter: Secondary | ICD-10-CM | POA: Diagnosis not present

## 2022-03-30 DIAGNOSIS — M25511 Pain in right shoulder: Secondary | ICD-10-CM

## 2022-03-30 DIAGNOSIS — G8929 Other chronic pain: Secondary | ICD-10-CM | POA: Diagnosis not present

## 2022-03-30 NOTE — Therapy (Signed)
OUTPATIENT PHYSICAL THERAPY TREATMENT NOTE   Patient Name: Michael Snyder MRN: [id number] DOB:[birth year], 71 y.o., male Today's Date: 03/30/2022  PCP: Hoyt Koch, MD REFERRING PROVIDER: Glennon Mac, DO  END OF SESSION:   PT End of Session - 03/30/22 0835     Visit Number 6    Number of Visits 12    Date for PT Re-Evaluation 05/05/22    Authorization Type UHC    PT Start Time 0834    PT Stop Time 0915    PT Time Calculation (min) 41 min    Activity Tolerance Patient tolerated treatment well;Patient limited by pain    Behavior During Therapy WFL for tasks assessed/performed               Past Medical History:  Diagnosis Date   Alcohol abuse    Arthritis    Diabetes mellitus, type 2 ([place])    Head injury, closed, without LOC 1980s   History of alcohol abuse    Hyperlipidemia    Hypertension    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   Pacemaker    Medtronic Adapta ADDR01   Seizure disorder ([place])    Seizures ([place])    last time in 1990's   Past Surgical History:  Procedure Laterality Date   LACERATION REPAIR  1980   scalp   PACEMAKER LEAD REMOVAL N/A 03/23/2014   Procedure: PACEMAKER LEAD REMOVAL;  Surgeon: Evans Lance, MD;  Location: [facility];  Service: Cardiovascular;  Laterality: N/A;   PTDVP     7/08 medtronic DDD   Patient Active Problem List   Diagnosis Date Noted   Acute pain of right shoulder 02/24/2022   Stage 3b chronic kidney disease ([place]) 09/12/2021   Hypothyroidism 08/03/2020   Constipation 07/18/2020   De Quervain's tenosynovitis, left 03/14/2019   Left wrist pain 09/23/2018   Hepatitis C virus infection cured after antiviral drug therapy 12/24/2015   Cataract, right eye 11/13/2014   Cataract, left eye 72/53/6644   Complications, pacemaker cardiac, 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/2014   Atrial fibrillation ([place]) 11/17/2012   Cardiac pacemaker MDT 11/26/2011   Routine health maintenance 06/16/2011   Essential hypertension 06/01/2008    Diabetes mellitus type 2 with complications ([place]) 03/47/4259   SICK SINUS SYNDROME 03/26/2007   SEIZURE DISORDER 03/26/2007    REFERRING DIAG: M25.511,G89.29 (ICD-10-CM) - Chronic right shoulder pain S46.011A (ICD-10-CM) - Rotator cuff strain, right, initial encounter   THERAPY DIAG: Rotator cuff strain, right, initial encounter    Rationale for Evaluation and Treatment Rehabilitation  PERTINENT HISTORY: Chronic right shoulder pain 2. Rotator cuff strain, right, initial encounter -Chronic with exacerbation, initial sports medicine visit - Acutely worsening right shoulder pain after incident with patient moving furniture with history of intermittent shoulder pain - Consistent with rotator cuff pathology.  I do not think the patient has a complete tear of any rotator cuff muscle based on maintained strength, however he may have large degree of partial tears versus grade 1 strain - Offered subacromial CSI, however patient stated that prior CSI significantly raise his blood glucose with history of DM type II, so patient wishes to avoid CSI - Start meloxicam 15 mg daily x2 weeks.  If still having pain after 2 weeks, Do not to use additional NSAIDs while taking meloxicam.  May use Tylenol [phone] mg 2 to 3 times a day for breakthrough pain.  We will only use meloxicam for a short 2-week course and no longer due to patient's comorbidities -  Start HEP and physical therapy for shoulder and rotator cuff - Ambulatory referral to Physical Therapy -X-ray obtained in clinic.  My interpretation: No acute fracture or dislocation.  Hyper sclerosis and glenoid and AC joint arthritis  PRECAUTIONS: R biceps tendon tear, non-functional L shoulder due to trauma  SUBJECTIVE: Pain at night has been less, 6/10 at worst, allowing him to sleep a bit better.  Tired as he was up late moving furniture and able to do what he needed to do w/o R shoulder limiting activity.  PAIN:  Are you having pain? Yes: NPRS scale:  6/10 Pain location: R shoulder Pain description: ache Aggravating factors: OH reaching Relieving factors: OTC ointment   OBJECTIVE: (objective measures completed at initial evaluation unless otherwise dated)  DIAGNOSTIC FINDINGS:  X-ray obtained in clinic.  My interpretation: No acute fracture or dislocation.  Hyper sclerosis and glenoid and AC joint arthritis   PATIENT SURVEYS:  FOTO 36(65 predicted)   COGNITION:           Overall cognitive status: Within functional limits for tasks assessed                                  SENSATION: Not tested   POSTURE: Rounded shoulders    UPPER EXTREMITY ROM:    A/PROM Right eval Left eval  Shoulder flexion 90/120    Shoulder extension      Shoulder abduction 90/150    Shoulder adduction      Shoulder internal rotation 70/80    Shoulder external rotation 60/70    Elbow flexion WFL    Elbow extension WFL    Wrist flexion      Wrist extension      Wrist ulnar deviation      Wrist radial deviation      Wrist pronation      Wrist supination      (Blank rows = not tested)   UPPER EXTREMITY MMT:   MMT Right eval Left eval  Shoulder flexion 4p!    Shoulder extension 5    Shoulder abduction 4p!    Shoulder adduction      Shoulder internal rotation 4    Shoulder external rotation 4    Middle trapezius      Lower trapezius      Elbow flexion 4-p!    Elbow extension 4    Wrist flexion      Wrist extension      Wrist ulnar deviation      Wrist radial deviation      Wrist pronation      Wrist supination      Grip strength (lbs)      (Blank rows = not tested)   SHOULDER SPECIAL TESTS:            Impingement tests: Neer impingement test: positive  and Hawkins/Kennedy impingement test: positive             Rotator cuff assessment: Drop arm test: negative, Empty can test: positive , and Full can test: positive             Biceps assessment:  R "Popeye" deformity   JOINT MOBILITY TESTING:  Not tested   PALPATION:  TTP  R biceps tendon proximal             TODAY'S TREATMENT:  [facility] Adult PT Treatment:  DATE: 03/30/22 Therapeutic Exercise: UBE 5 min B arms Supine elbow flexion R 1000g ball 2x12(supinated) Supine elbow flexion 1000g ball 2x12(pronated) Supine press R 1000g ball 2x12 Supine flexion R 1000g ball 2x12 L sidelie R ER 1000g 2x12 L sidelie abduction 1000g ball 2x12 Prone flexion 1000g ball 2x12 Prone extension 1000g ball 2x12 Prone hor abd 1000g ball 2x12 Manual Therapy: PROM  PNF D1 F/E 15x  [facility] Adult PT Treatment:                                                DATE: 03/26/2022 Therapeutic Exercise: Supine elbow flexion R 1000g ball 2x10(supinated) Supine elbow flexion 1000g ball 2x10(pronated) Supine press R 1000g ball 2x10 Supine flexion R 1000g ball 2x10 L sidelie R ER 1000g 2x10 L sidelie abduction 1000g ball 2x10 Prone flexion 1000g ball 15x Prone extension 1000g ball 2x10 Prone hor abd 1000g ball 15x UBE RUE only L1 4 min Manual Therapy: Rhythmic stabilization at 90d flexion 60s x2 PROM R shoulder  [facility] Adult PT Treatment:                                                DATE: 03/23/22 Therapeutic Exercise: Supine elbow flexion R 1000g ball 15x(supinated) Supine elbow flexion 1000g ball 15x(pronated) Supine press R 1000g ball 15x Supine flexion R 1000g ball 15x L sidelie R ER 1000g 15x L sidelie abduction 1000g ball 15x Prone flexion 1000g ball 15x Prone extenison 1000g ball 15x Prone hor abd 1000g ball 15x UBE RUE only L1 4 min Manual Therapy: Rhythmic stabilization at 90d flexion 60s x2 Supine PNF D1 F/E 15x L sidelie 4 way scapula 15x each     PATIENT EDUCATION: Education details: Discussed eval findings, rehab rationale and POC and patient is in agreement  Person educated: Patient Education method: Explanation Education comprehension: verbalized understanding and needs further education     HOME EXERCISE  PROGRAM: Access Code: [email or web address] URL: [email or web address] Date: 03/10/2022 Prepared by: Sharlynn Oliphant   Exercises - Supine Shoulder Press with Dowel  - 2 x daily - 5 x weekly - 3 sets - 10 reps - Supine Shoulder External Rotation with Dowel  - 2 x daily - 5 x weekly - 3 sets - 10 reps - Shoulder Flexion Wall Slide with Towel  - 2 x daily - 5 x weekly - 3 sets - 10 reps   ASSESSMENT:   CLINICAL IMPRESSION: Night pain and symptoms improving.  Able to use LUE on UBE today, increased reps on all supine tasks today but no change in weight/resistance. Shoulder strength increasing and AROM improving.    OBJECTIVE IMPAIRMENTS decreased knowledge of condition, decreased ROM, decreased strength, impaired UE functional use, postural dysfunction, and pain.    ACTIVITY LIMITATIONS carrying, lifting, dressing, and reach over head   PERSONAL FACTORS Age, Fitness, and 1 comorbidity: R biceps tendon rupture are also affecting patient's functional outcome.    REHAB POTENTIAL: Good   CLINICAL DECISION MAKING: Stable/uncomplicated   EVALUATION COMPLEXITY: Low     GOALS: Goals reviewed with patient? Yes   SHORT TERM GOALS: Target date: 03/31/22     Patient to demonstrate independence in HEP  Baseline:[email or web address] Goal status: INITIAL   2.  Decrease  worst pain to 8/10 Baseline: 10/10 Goal status: INITIAL   3.  Increase AROM of flexion and abduction to 120d Baseline: 90d each motion Goal status: INITIAL     LONG TERM GOALS: Target date: 04/21/2022     160d AROM flexion an abduction Baseline: 90d each Goal status: INITIAL   2.  4+/5 R shoulder/elbow strength Baseline:  MMT Right eval Left eval  Shoulder flexion 4p!    Shoulder extension 5    Shoulder abduction 4p!    Shoulder adduction      Shoulder internal rotation 4    Shoulder external rotation 4    Middle trapezius      Lower trapezius      Elbow flexion 4-p!    Elbow extension 4      Goal status:  INITIAL   3.  Increase FOTO to 65 Baseline: 36  Goal status: INITIAL   4.  4/10 worst pain Baseline: 10/10 Goal status: INITIAL       PLAN: PT FREQUENCY: 1-2x/week   PT DURATION: 6 weeks   PLANNED INTERVENTIONS: Therapeutic exercises, Therapeutic activity, Neuromuscular re-education, Balance training, Gait training, Patient/Family education, Self Care, Joint mobilization, Manual therapy, and Re-evaluation   PLAN FOR NEXT SESSION: ROM, RC and shoulder strengthening, posture retraining, functional tasks, HEP review and update.    Lanice Shirts, PT 03/30/2022, 9:14 AM

## 2022-04-02 ENCOUNTER — Ambulatory Visit: Payer: Medicare Other

## 2022-04-02 DIAGNOSIS — S46011A Strain of muscle(s) and tendon(s) of the rotator cuff of right shoulder, initial encounter: Secondary | ICD-10-CM | POA: Diagnosis not present

## 2022-04-02 DIAGNOSIS — M25511 Pain in right shoulder: Secondary | ICD-10-CM

## 2022-04-02 DIAGNOSIS — G8929 Other chronic pain: Secondary | ICD-10-CM | POA: Diagnosis not present

## 2022-04-02 NOTE — Therapy (Signed)
OUTPATIENT PHYSICAL THERAPY TREATMENT NOTE   Patient Name: Michael Snyder MRN: [id number] DOB:[birth year], 71 y.o., male Today's Date: 04/02/2022  PCP: Hoyt Koch, MD REFERRING PROVIDER: Glennon Mac, DO  END OF SESSION:   PT End of Session - 04/02/22 0959     Visit Number 7    Number of Visits 12    Date for PT Re-Evaluation 05/05/22    Authorization Type UHC    Authorization Time Period FOTO v6, v10    Progress Note Due on Visit 10    PT Start Time 1000    PT Stop Time 1040    PT Time Calculation (min) 40 min    Activity Tolerance Patient tolerated treatment well;Patient limited by pain    Behavior During Therapy WFL for tasks assessed/performed                Past Medical History:  Diagnosis Date   Alcohol abuse    Arthritis    Diabetes mellitus, type 2 ([place])    Head injury, closed, without LOC 1980s   History of alcohol abuse    Hyperlipidemia    Hypertension    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   Pacemaker    Medtronic Adapta ADDR01   Seizure disorder ([place])    Seizures ([place])    last time in 1990's   Past Surgical History:  Procedure Laterality Date   LACERATION REPAIR  1980   scalp   PACEMAKER LEAD REMOVAL N/A 03/23/2014   Procedure: PACEMAKER LEAD REMOVAL;  Surgeon: Evans Lance, MD;  Location: [facility];  Service: Cardiovascular;  Laterality: N/A;   PTDVP     7/08 medtronic DDD   Patient Active Problem List   Diagnosis Date Noted   Acute pain of right shoulder 02/24/2022   Stage 3b chronic kidney disease ([place]) 09/12/2021   Hypothyroidism 08/03/2020   Constipation 07/18/2020   De Quervain's tenosynovitis, left 03/14/2019   Left wrist pain 09/23/2018   Hepatitis C virus infection cured after antiviral drug therapy 12/24/2015   Cataract, right eye 11/13/2014   Cataract, left eye [id number]   Complications, pacemaker cardiac, 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/2014   Atrial fibrillation ([place]) 11/17/2012   Cardiac pacemaker MDT 11/26/2011    Routine health maintenance 06/16/2011   Essential hypertension 06/01/2008   Diabetes mellitus type 2 with complications ([place]) [id number]   SICK SINUS SYNDROME 03/26/2007   SEIZURE DISORDER 03/26/2007    REFERRING DIAG: M25.511,G89.29 (ICD-10-CM) - Chronic right shoulder pain S46.011A (ICD-10-CM) - Rotator cuff strain, right, initial encounter   THERAPY DIAG: Rotator cuff strain, right, initial encounter    Rationale for Evaluation and Treatment Rehabilitation  PERTINENT HISTORY: Chronic right shoulder pain 2. Rotator cuff strain, right, initial encounter -Chronic with exacerbation, initial sports medicine visit - Acutely worsening right shoulder pain after incident with patient moving furniture with history of intermittent shoulder pain - Consistent with rotator cuff pathology.  I do not think the patient has a complete tear of any rotator cuff muscle based on maintained strength, however he may have large degree of partial tears versus grade 1 strain - Offered subacromial CSI, however patient stated that prior CSI significantly raise his blood glucose with history of DM type II, so patient wishes to avoid CSI - Start meloxicam 15 mg daily x2 weeks.  If still having pain after 2 weeks, Do not to use additional NSAIDs while taking meloxicam.  May use Tylenol [phone] mg 2 to 3 times a day for breakthrough  pain.  We will only use meloxicam for a short 2-week course and no longer due to patient's comorbidities - Start HEP and physical therapy for shoulder and rotator cuff - Ambulatory referral to Physical Therapy -X-ray obtained in clinic.  My interpretation: No acute fracture or dislocation.  Hyper sclerosis and glenoid and AC joint arthritis  PRECAUTIONS: R biceps tendon tear, non-functional L shoulder due to trauma  SUBJECTIVE: Pt reports continued 6/10 Rt shoulder pain today. He reports daily adherence to his HEP.  PAIN:  Are you having pain? Yes: NPRS scale: 6/10 Pain location: Rt  shoulder Pain description: ache Aggravating factors: OH reaching Relieving factors: OTC ointment   OBJECTIVE: (objective measures completed at initial evaluation unless otherwise dated)  DIAGNOSTIC FINDINGS:  X-ray obtained in clinic.  My interpretation: No acute fracture or dislocation.  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  Shoulder extension       Shoulder abduction 90/150   105p!  Shoulder adduction       Shoulder internal rotation 70/80   75  Shoulder external rotation 60/70   55  Elbow flexion WFL     Elbow extension [facility]     Wrist flexion       Wrist extension       Wrist ulnar deviation       Wrist radial deviation       Wrist pronation       Wrist supination       (Blank rows = not tested)   UPPER EXTREMITY MMT:   MMT Right eval Left eval Right 04/02/2022  Shoulder flexion 4p!   4+p!  Shoulder extension 5   5  Shoulder abduction 4p!   5  Shoulder adduction       Shoulder internal rotation 4   5  Shoulder external rotation 4   5  Middle trapezius       Lower trapezius       Elbow flexion 4-p!   5  Elbow extension 4   5  Wrist flexion       Wrist extension       Wrist ulnar deviation       Wrist radial deviation       Wrist pronation       Wrist supination       Grip strength (lbs)       (Blank rows = not tested)   SHOULDER SPECIAL TESTS:            Impingement tests: Neer impingement test: positive  and Hawkins/Kennedy impingement test: positive             Rotator cuff assessment: Drop arm test: negative, Empty can test: positive , and Full can test: positive             Biceps assessment:   R "Popeye" deformity   JOINT MOBILITY TESTING:  Not tested   PALPATION:  TTP R  biceps tendon proximal             TODAY'S TREATMENT:   [facility] Adult PT Treatment:                                                DATE: 04/02/2022 Therapeutic Exercise: Seated BIL shoulder ER with scapular retraction with GTB 3x10  Standing Rt shoulder scaption AAROM with dowel rod 2x10 with 5-sec holds at end range Standing low rows with blue TB in doorway 3x10 Standing BIL scaption lift with 2# dumbbells to roughly 60 degrees of elevation with back to wall 3x8 Corner pec stretch x18min Manual Therapy: N/A Neuromuscular re-ed: N/A Therapeutic Activity: Re-assessment of objective measures with pt education Re-administration of FOTO with pt education Update to HEP Modalities: N/A Self Care: N/A   [facility] Adult PT Treatment:                                                DATE: 03/30/22 Therapeutic Exercise: UBE 5 min B arms Supine elbow flexion R 1000g ball 2x12(supinated) Supine elbow flexion 1000g ball 2x12(pronated) Supine press R 1000g ball 2x12 Supine flexion R 1000g ball 2x12 L sidelie R ER 1000g 2x12 L sidelie abduction 1000g ball 2x12 Prone flexion 1000g ball 2x12 Prone extension 1000g ball 2x12 Prone hor abd 1000g ball 2x12 Manual Therapy: PROM  PNF D1 F/E 15x  [facility] Adult PT Treatment:                                                DATE: 03/26/2022 Therapeutic Exercise: Supine elbow flexion R 1000g ball 2x10(supinated) Supine elbow flexion 1000g ball 2x10(pronated) Supine press R 1000g ball 2x10 Supine flexion R 1000g ball 2x10 L sidelie R ER 1000g 2x10 L sidelie abduction 1000g ball 2x10 Prone flexion 1000g ball 15x Prone extension 1000g ball 2x10 Prone hor abd 1000g ball 15x UBE RUE only L1 4 min Manual Therapy: Rhythmic stabilization at 90d flexion 60s x2 PROM R shoulder     PATIENT EDUCATION: Education details: Discussed eval findings, rehab rationale and POC and patient  is in agreement  Person educated: Patient Education method: Explanation Education comprehension: verbalized understanding and needs further education     HOME EXERCISE PROGRAM: Access Code: [email or web address] URL: [email or web address] Date: 03/10/2022 Prepared by: Sharlynn Oliphant   Exercises - Supine Shoulder Press with Dowel  - 2 x daily - 5 x weekly - 3 sets - 10 reps - Supine Shoulder External Rotation with Dowel  - 2 x daily - 5 x weekly - 3 sets - 10 reps - Shoulder Flexion Wall Slide with Towel  - 2 x daily - 5 x weekly - 3 sets - 10 reps  Added 04/02/2022: - Shoulder External Rotation and Scapular Retraction with Resistance  - 1 x daily - 7 x weekly - 3 sets - 10 reps - 3 seconds hold - Shoulder Scaption AAROM with Dowel  - 1 x daily - 7 x weekly - 3 sets - 10 reps - 5 seconds hold - Standing Shoulder Row with Anchored Resistance  - 1 x daily -  7 x weekly - 3 sets - 10 reps - 3 seconds hold   ASSESSMENT:   CLINICAL IMPRESSION: Pt continues to progress well with PT. Upon re-assessment of pt goals, the pt has made excellent progress in Rt shoulder elevation AROM, global Rt shoulder and elbow strength, pain levels, and FOTO score. He tolerated progressed exercises well, and these exercises were added to his HEP. Pt will continue to benefit from skilled PT to address his primary impairments and return to his prior level of function with less limitation.    OBJECTIVE IMPAIRMENTS decreased knowledge of condition, decreased ROM, decreased strength, impaired UE functional use, postural dysfunction, and pain.    ACTIVITY LIMITATIONS carrying, lifting, dressing, and reach over head   PERSONAL FACTORS Age, Fitness, and 1 comorbidity: R biceps tendon rupture are also affecting patient's functional outcome.        GOALS: Goals reviewed with patient? Yes   SHORT TERM GOALS: Target date: 03/31/22     Patient to demonstrate independence in HEP  Baseline:[email or web address] 04/02/2022: Pt  reports daily adherence to his HEP Goal status: ACHIEVED   2.  Decrease worst pain to 8/10 Baseline: 10/10 04/02/2022: 7/10 Goal status: ACHIEVED   3.  Increase AROM of flexion and abduction to 120d Baseline: 90d each motion 04/02/2022: 123d flexion, 105d abduction Goal status: IN PROGRESS     LONG TERM GOALS: Target date: 04/21/2022     160d AROM flexion an abduction Baseline: 90d each 04/02/2022: 123d flexion, 105d abduction Goal status: IN PROGRESS   2.  4+/5 R shoulder/elbow strength Baseline:  MMT Right eval Left eval  Shoulder flexion 4p!    Shoulder extension 5    Shoulder abduction 4p!    Shoulder adduction      Shoulder internal rotation 4    Shoulder external rotation 4    Middle trapezius      Lower trapezius      Elbow flexion 4-p!    Elbow extension 4      04/02/2022: 4+/5 to 5/5 elbow and shoulder strength  Goal status: ACHIEVED   3.  Increase FOTO to 65 Baseline: 36  04/02/2022: 60 Goal status: IN PROGRESS   4.  4/10 worst pain Baseline: 10/10 04/02/2022: 7/10 Goal status: IN PROGRESS       PLAN: PT FREQUENCY: 1-2x/week   PT DURATION: 6 weeks   PLANNED INTERVENTIONS: Therapeutic exercises, Therapeutic activity, Neuromuscular re-education, Balance training, Gait training, Patient/Family education, Self Care, Joint mobilization, Manual therapy, and Re-evaluation   PLAN FOR NEXT SESSION: ROM, RC and shoulder strengthening, posture retraining, functional tasks, HEP review and update.   Vanessa [place], PT, DPT 04/02/22 10:42 AM

## 2022-04-06 ENCOUNTER — Ambulatory Visit: Payer: Medicare Other | Attending: Sports Medicine

## 2022-04-06 DIAGNOSIS — S46011A Strain of muscle(s) and tendon(s) of the rotator cuff of right shoulder, initial encounter: Secondary | ICD-10-CM | POA: Diagnosis not present

## 2022-04-06 DIAGNOSIS — M25511 Pain in right shoulder: Secondary | ICD-10-CM | POA: Insufficient documentation

## 2022-04-06 NOTE — Therapy (Signed)
OUTPATIENT PHYSICAL THERAPY TREATMENT NOTE   Patient Name: Michael Snyder MRN: [id number] DOB:24-Jun-1951, 71 y.o., male Today's Date: 04/06/2022  PCP: Hoyt Koch, MD REFERRING PROVIDER: Glennon Mac, DO  END OF SESSION:   PT End of Session - 04/06/22 0830     Visit Number 8    Number of Visits 12    Date for PT Re-Evaluation 05/05/22    Authorization Type UHC    Authorization Time Period FOTO v6, v10    Progress Note Due on Visit 10    PT Start Time 0830    PT Stop Time 0912    PT Time Calculation (min) 42 min    Activity Tolerance Patient tolerated treatment well;Patient limited by pain    Behavior During Therapy WFL for tasks assessed/performed                Past Medical History:  Diagnosis Date   Alcohol abuse    Arthritis    Diabetes mellitus, type 2 ([place])    Head injury, closed, without LOC 1980s   History of alcohol abuse    Hyperlipidemia    Hypertension    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   Pacemaker    Medtronic Adapta [id number]   Seizure disorder ([place])    Seizures ([place])    last time in 1990's   Past Surgical History:  Procedure Laterality Date   LACERATION REPAIR  1980   scalp   PACEMAKER LEAD REMOVAL N/A 03/23/2014   Procedure: PACEMAKER LEAD REMOVAL;  Surgeon: Evans Lance, MD;  Location: [facility] [place];  Service: Cardiovascular;  Laterality: N/A;   PTDVP     7/08 medtronic DDD   Patient Active Problem List   Diagnosis Date Noted   Acute pain of right shoulder 02/24/2022   Stage 3b chronic kidney disease ([place]) 09/12/2021   Hypothyroidism 08/03/2020   Constipation 07/18/2020   De Quervain's tenosynovitis, left 03/14/2019   Left wrist pain 09/23/2018   Hepatitis C virus infection cured after antiviral drug therapy 12/24/2015   Cataract, right eye 11/13/2014   Cataract, left eye [id number]   Complications, pacemaker cardiac, 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/2014   Atrial fibrillation ([place]) 11/17/2012   Cardiac pacemaker MDT 11/26/2011    Routine health maintenance 06/16/2011   Essential hypertension 06/01/2008   Diabetes mellitus type 2 with complications ([place]) [id number]   SICK SINUS SYNDROME 03/26/2007   SEIZURE DISORDER 03/26/2007    REFERRING DIAG: M25.511,G89.29 (ICD-10-CM) - Chronic right shoulder pain S46.011A (ICD-10-CM) - Rotator cuff strain, right, initial encounter   THERAPY DIAG: Rotator cuff strain, right, initial encounter    Rationale for Evaluation and Treatment Rehabilitation  PERTINENT HISTORY: Chronic right shoulder pain 2. Rotator cuff strain, right, initial encounter -Chronic with exacerbation, initial sports medicine visit - Acutely worsening right shoulder pain after incident with patient moving furniture with history of intermittent shoulder pain - Consistent with rotator cuff pathology.  I do not think the patient has a complete tear of any rotator cuff muscle based on maintained strength, however he may have large degree of partial tears versus grade 1 strain - Offered subacromial CSI, however patient stated that prior CSI significantly raise his blood glucose with history of DM type II, so patient wishes to avoid CSI - Start meloxicam 15 mg daily x2 weeks.  If still having pain after 2 weeks, Do not to use additional NSAIDs while taking meloxicam.  May use Tylenol [phone] 2 to 3 times a day for breakthrough  pain.  We will only use meloxicam for a short 2-week course and no longer due to patient's comorbidities - Start HEP and physical therapy for shoulder and rotator cuff - Ambulatory referral to Physical Therapy -X-ray obtained in clinic.  My interpretation: No acute fracture or dislocation.  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 Yes: NPRS scale: 6/10 Pain location: Rt shoulder Pain description:  ache Aggravating factors: OH reaching Relieving factors: OTC ointment   OBJECTIVE: (objective measures completed at initial evaluation unless otherwise dated)  DIAGNOSTIC FINDINGS:  X-ray obtained in clinic.  My interpretation: No acute fracture or dislocation.  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  Shoulder extension       Shoulder abduction 90/150   105p!  Shoulder adduction       Shoulder internal rotation 70/80   75  Shoulder external rotation 60/70   55  Elbow flexion WFL     Elbow extension [facility]     Wrist flexion       Wrist extension       Wrist ulnar deviation       Wrist radial deviation       Wrist pronation       Wrist supination       (Blank rows = not tested)   UPPER EXTREMITY MMT:   MMT Right eval Left eval Right 04/02/2022  Shoulder flexion 4p!   4+p!  Shoulder extension 5   5  Shoulder abduction 4p!   5  Shoulder adduction       Shoulder internal rotation 4   5  Shoulder external rotation 4   5  Middle trapezius       Lower trapezius       Elbow flexion 4-p!   5  Elbow extension 4   5  Wrist flexion       Wrist extension       Wrist ulnar deviation       Wrist radial deviation       Wrist pronation       Wrist supination       Grip strength (lbs)       (Blank rows = not tested)   SHOULDER SPECIAL TESTS:            Impingement tests: Neer impingement test: positive  and Hawkins/Kennedy impingement test: positive             Rotator cuff assessment: Drop arm test: negative, Empty can test: positive , and Full can test: positive             Biceps assessment:  R "Popeye" deformity    JOINT MOBILITY TESTING:  Not tested   PALPATION:  TTP R biceps tendon proximal  TODAY'S TREATMENT:  [facility] Adult PT Treatment:                                                DATE: 04/06/22 Therapeutic Exercise: UBE 5 min B arms Seated scaption 2# 15x R Seated elbow flexion 2# 15x R Seated elbow extension YTB 15x R Seated OH press 2# 15x R Seated ER YTB 15x Supine elbow flexion 15x R 2# pronated Supine elbow flexion 2# 15x R supinated Supine pro/sup 2# 15/15 Supine press R 2# 15x Supine flexion R 2# 15x Prone scaption 2# R 15x Prone flexion 2# 15x Prone extension 2# 15x Prone hor abd 2# 15x Standing ball roll flexion 15x Manual Therapy: PROM  PNF D1 F/E 15x  [facility] Adult PT Treatment:                                                DATE: 04/02/2022 Therapeutic Exercise: Seated BIL shoulder ER with scapular retraction with GTB 3x10  Standing Rt shoulder scaption AAROM with dowel rod 2x10 with 5-sec holds at end range Standing low rows with blue TB in doorway 3x10 Standing BIL scaption lift with 2# dumbbells to roughly 60 degrees of elevation with back to wall 3x8 Corner pec stretch x67min Manual Therapy: N/A Neuromuscular re-ed: N/A Therapeutic Activity: Re-assessment of objective measures with pt education Re-administration of FOTO with pt education Update to HEP Modalities: N/A Self Care: N/A   [facility] Adult PT Treatment:                                                DATE: 03/30/22 Therapeutic Exercise: UBE 5 min B arms Supine elbow flexion R 1000g ball 2x12(supinated) Supine elbow flexion 1000g ball 2x12(pronated) Supine press R 1000g ball 2x12 Supine flexion R 1000g ball 2x12 L sidelie R ER 1000g 2x12 L sidelie abduction 1000g ball 2x12 Prone flexion 1000g ball 2x12 Prone extension 1000g ball 2x12 Prone hor abd 1000g ball 2x12 Manual Therapy: PROM  PNF D1 F/E 15x  [facility] Adult PT Treatment:                                                DATE:  03/26/2022 Therapeutic Exercise: Supine elbow flexion R 1000g ball 2x10(supinated) Supine elbow flexion 1000g ball 2x10(pronated) Supine press R 1000g ball 2x10 Supine flexion R 1000g ball 2x10 L sidelie R ER 1000g 2x10 L sidelie abduction 1000g ball 2x10 Prone flexion 1000g ball 15x Prone extension 1000g ball 2x10 Prone hor abd 1000g ball 15x UBE RUE only L1 4 min Manual Therapy: Rhythmic stabilization at 90d flexion 60s x2 PROM R shoulder     PATIENT EDUCATION: Education details: Discussed eval findings, rehab rationale and POC and patient is in agreement  Person educated: Patient Education method: Explanation Education comprehension: verbalized understanding and needs further education     HOME EXERCISE PROGRAM: Access Code: [email or web address] URL: [email or web address] Date: 03/10/2022 Prepared by: Sharlynn Oliphant   Exercises - Supine  Shoulder Press with Dowel  - 2 x daily - 5 x weekly - 3 sets - 10 reps - Supine Shoulder External Rotation with Dowel  - 2 x daily - 5 x weekly - 3 sets - 10 reps - Shoulder Flexion Wall Slide with Towel  - 2 x daily - 5 x weekly - 3 sets - 10 reps  Added 04/02/2022: - Shoulder External Rotation and Scapular Retraction with Resistance  - 1 x daily - 7 x weekly - 3 sets - 10 reps - 3 seconds hold - Shoulder Scaption AAROM with Dowel  - 1 x daily - 7 x weekly - 3 sets - 10 reps - 5 seconds hold - Standing Shoulder Row with Anchored Resistance  - 1 x daily - 7 x weekly - 3 sets - 10 reps - 3 seconds hold   ASSESSMENT:   CLINICAL IMPRESSION: Transitioned to 2# dumbbell for today's session.  Adjusted position of exercises to incorporate more functional positions to assess AROM against gravity.  AROM not yet functional, limited by pain and substitution patterns.    Showing good functional reach when rolling ball up wall.   OBJECTIVE IMPAIRMENTS decreased knowledge of condition, decreased ROM, decreased strength, impaired UE functional use,  postural dysfunction, and pain.    ACTIVITY LIMITATIONS carrying, lifting, dressing, and reach over head   PERSONAL FACTORS Age, Fitness, and 1 comorbidity: R biceps tendon rupture are also affecting patient's functional outcome.        GOALS: Goals reviewed with patient? Yes   SHORT TERM GOALS: Target date: 03/31/22     Patient to demonstrate independence in HEP  Baseline:[email or web address] 04/02/2022: Pt reports daily adherence to his HEP Goal status: ACHIEVED   2.  Decrease worst pain to 8/10 Baseline: 10/10 04/02/2022: 7/10 Goal status: ACHIEVED   3.  Increase AROM of flexion and abduction to 120d Baseline: 90d each motion 04/02/2022: 123d flexion, 105d abduction Goal status: IN PROGRESS     LONG TERM GOALS: Target date: 04/21/2022     160d AROM flexion an abduction Baseline: 90d each 04/02/2022: 123d flexion, 105d abduction Goal status: IN PROGRESS   2.  4+/5 R shoulder/elbow strength Baseline:  MMT Right eval Left eval  Shoulder flexion 4p!    Shoulder extension 5    Shoulder abduction 4p!    Shoulder adduction      Shoulder internal rotation 4    Shoulder external rotation 4    Middle trapezius      Lower trapezius      Elbow flexion 4-p!    Elbow extension 4      04/02/2022: 4+/5 to 5/5 elbow and shoulder strength  Goal status: ACHIEVED   3.  Increase FOTO to 65 Baseline: 36  04/02/2022: 60 Goal status: IN PROGRESS   4.  4/10 worst pain Baseline: 10/10 04/02/2022: 7/10 Goal status: IN PROGRESS       PLAN: PT FREQUENCY: 1-2x/week   PT DURATION: 6 weeks   PLANNED INTERVENTIONS: Therapeutic exercises, Therapeutic activity, Neuromuscular re-education, Balance training, Gait training, Patient/Family education, Self Care, Joint mobilization, Manual therapy, and Re-evaluation   PLAN FOR NEXT SESSION: ROM, RC and shoulder strengthening, posture retraining, functional tasks, HEP review and update.   Vanessa [place], PT, DPT 04/06/22 9:13 AM

## 2022-04-07 DIAGNOSIS — E119 Type 2 diabetes mellitus without complications: Secondary | ICD-10-CM | POA: Diagnosis not present

## 2022-04-08 NOTE — Therapy (Signed)
OUTPATIENT PHYSICAL THERAPY TREATMENT NOTE   Patient Name: Michael Snyder MRN: [id number] DOB:[birth year], 71 y.o., male Today's Date: 04/09/2022  PCP: Hoyt Koch, MD REFERRING PROVIDER: Glennon Mac, DO  END OF SESSION:   PT End of Session - 04/09/22 0912     Visit Number 9    Number of Visits 12    Date for PT Re-Evaluation 05/05/22    Authorization Type UHC    Authorization Time Period FOTO v6, v10    Progress Note Due on Visit 10    PT Start Time 0915    PT Stop Time 1001    PT Time Calculation (min) 46 min    Activity Tolerance Patient tolerated treatment well;Patient limited by pain    Behavior During Therapy WFL for tasks assessed/performed              Past Medical History:  Diagnosis Date   Alcohol abuse    Arthritis    Diabetes mellitus, type 2 ([place])    Head injury, closed, without LOC 1980s   History of alcohol abuse    Hyperlipidemia    Hypertension    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   Pacemaker    Medtronic Adapta ADDR01   Seizure disorder ([place])    Seizures ([place])    last time in 1990's   Past Surgical History:  Procedure Laterality Date   LACERATION REPAIR  1980   scalp   PACEMAKER LEAD REMOVAL N/A 03/23/2014   Procedure: PACEMAKER LEAD REMOVAL;  Surgeon: Evans Lance, MD;  Location: [place];  Service: Cardiovascular;  Laterality: N/A;   PTDVP     7/08 medtronic DDD   Patient Active Problem List   Diagnosis Date Noted   Acute pain of right shoulder 02/24/2022   Stage 3b chronic kidney disease ([place]) 09/12/2021   Hypothyroidism 08/03/2020   Constipation 07/18/2020   De Quervain's tenosynovitis, left 03/14/2019   Left wrist pain 09/23/2018   Hepatitis C virus infection cured after antiviral drug therapy 12/24/2015   Cataract, right eye 11/13/2014   Cataract, left eye [id number]   Complications, pacemaker cardiac, 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/2014   Atrial fibrillation ([place]) 11/17/2012   Cardiac pacemaker MDT 11/26/2011    Routine health maintenance 06/16/2011   Essential hypertension 06/01/2008   Diabetes mellitus type 2 with complications ([place]) [id number]   SICK SINUS SYNDROME 03/26/2007   SEIZURE DISORDER 03/26/2007    REFERRING DIAG: M25.511,G89.29 (ICD-10-CM) - Chronic right shoulder pain S46.011A (ICD-10-CM) - Rotator cuff strain, right, initial encounter   THERAPY DIAG: Rotator cuff strain, right, initial encounter    Rationale for Evaluation and Treatment Rehabilitation  PERTINENT HISTORY: Chronic right shoulder pain 2. Rotator cuff strain, right, initial encounter -Chronic with exacerbation, initial sports medicine visit - Acutely worsening right shoulder pain after incident with patient moving furniture with history of intermittent shoulder pain - Consistent with rotator cuff pathology.  I do not think the patient has a complete tear of any rotator cuff muscle based on maintained strength, however he may have large degree of partial tears versus grade 1 strain - Offered subacromial CSI, however patient stated that prior CSI significantly raise his blood glucose with history of DM type II, so patient wishes to avoid CSI - Start meloxicam 15 mg daily x2 weeks.  If still having pain after 2 weeks, Do not to use additional NSAIDs while taking meloxicam.  May use Tylenol [phone] 2 to 3 times a day for breakthrough pain.  We will only use meloxicam for a short 2-week course and no longer due to patient's comorbidities - Start HEP and physical therapy for shoulder and rotator cuff - Ambulatory referral to Physical Therapy -X-ray obtained in clinic.  My interpretation: No acute fracture or dislocation.  Hyper sclerosis and glenoid and AC joint arthritis  PRECAUTIONS: R biceps tendon tear, non-functional L shoulder due to trauma  SUBJECTIVE: Patient reports he did a lot of work yesterday and has been sore since then.  PAIN:  Are you having pain? Yes: NPRS scale: 7/10 Pain location: Rt shoulder Pain  description: ache Aggravating factors: OH reaching Relieving factors: OTC ointment   OBJECTIVE: (objective measures completed at initial evaluation unless otherwise dated)  DIAGNOSTIC FINDINGS:  X-ray obtained in clinic.  My interpretation: No acute fracture or dislocation.  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  Shoulder extension       Shoulder abduction 90/150   105p!  Shoulder adduction       Shoulder internal rotation 70/80   75  Shoulder external rotation 60/70   55  Elbow flexion WFL     Elbow extension [facility]     Wrist flexion       Wrist extension       Wrist ulnar deviation       Wrist radial deviation       Wrist pronation       Wrist supination       (Blank rows = not tested)   UPPER EXTREMITY MMT:   MMT Right eval Left eval Right 04/02/2022  Shoulder flexion 4p!   4+p!  Shoulder extension 5   5  Shoulder abduction 4p!   5  Shoulder adduction       Shoulder internal rotation 4   5  Shoulder external rotation 4   5  Middle trapezius       Lower trapezius       Elbow flexion 4-p!   5  Elbow extension 4   5  Wrist flexion       Wrist extension       Wrist ulnar deviation       Wrist radial deviation       Wrist pronation       Wrist supination       Grip strength (lbs)       (Blank rows = not tested)   SHOULDER SPECIAL TESTS:            Impingement tests: Neer impingement test: positive  and Hawkins/Kennedy impingement test: positive             Rotator cuff assessment: Drop arm test: negative, Empty can test: positive , and Full can test: positive             Biceps assessment:  R "Popeye"  deformity   JOINT MOBILITY TESTING:  Not tested   PALPATION:  TTP R biceps tendon  proximal             TODAY'S TREATMENT:  [facility] Adult PT Treatment:                                                DATE: 04/09/2022 Therapeutic Exercise: UBE 5 min B arms Standing ball roll flexion 15x Standing low rows with blue TB 2x10 Standing BIL scaption lift with 2# dumbbells to roughly 60 degrees of elevation with back to wall x15 Seated BIL shoulder ER with scapular retraction with GTB 3x10  Seated elbow flexion 2# R pronated/supinated 2x10 each Seated OH press 2# 15x R Seated pro/sup 2# 20/20 Supine press R 3# 2x10 Supine flexion R 3# 1x10, 2# 1x5 Modalities: MHP to Rt shoulder, pt in sitting, x8 mins   [facility] Adult PT Treatment:                                                DATE: 04/06/22 Therapeutic Exercise: UBE 5 min B arms Seated scaption 2# 15x R Seated elbow flexion 2# 15x R Seated elbow extension YTB 15x R Seated OH press 2# 15x R Seated ER YTB 15x Supine elbow flexion 15x R 2# pronated Supine elbow flexion 2# 15x R supinated Supine pro/sup 2# 15/15 Supine press R 2# 15x Supine flexion R 2# 15x Prone scaption 2# R 15x Prone flexion 2# 15x Prone extension 2# 15x Prone hor abd 2# 15x Standing ball roll flexion 15x Manual Therapy: PROM  PNF D1 F/E 15x  [facility] Adult PT Treatment:                                                DATE: 04/02/2022 Therapeutic Exercise: Seated BIL shoulder ER with scapular retraction with GTB 3x10  Standing Rt shoulder scaption AAROM with dowel rod 2x10 with 5-sec holds at end range Standing low rows with blue TB in doorway 3x10 Standing BIL scaption lift with 2# dumbbells to roughly 60 degrees of elevation with back to wall 3x8 Corner pec stretch x33min Manual Therapy: N/A Neuromuscular re-ed: N/A Therapeutic Activity: Re-assessment of objective measures with pt education Re-administration of FOTO with pt education Update to  HEP Modalities: N/A Self Care: N/A   [facility] Adult PT Treatment:                                                DATE: 03/30/22 Therapeutic Exercise: UBE 5 min B arms Supine elbow flexion R 1000g ball 2x12(supinated) Supine elbow flexion 1000g ball 2x12(pronated) Supine press R 1000g ball 2x12 Supine flexion R 1000g ball 2x12 L sidelie R ER 1000g 2x12 L sidelie abduction 1000g ball 2x12 Prone flexion 1000g ball 2x12 Prone extension 1000g ball 2x12 Prone hor abd 1000g ball 2x12 Manual Therapy: PROM  PNF D1 F/E 15x     PATIENT EDUCATION: Education details: Discussed eval findings, rehab rationale and POC and patient is in agreement  Person educated: Patient Education method: Explanation Education comprehension:  verbalized understanding and needs further education     HOME EXERCISE PROGRAM: Access Code: [email or web address] URL: [email or web address] Date: 03/10/2022 Prepared by: Sharlynn Oliphant   Exercises - Supine Shoulder Press with Dowel  - 2 x daily - 5 x weekly - 3 sets - 10 reps - Supine Shoulder External Rotation with Dowel  - 2 x daily - 5 x weekly - 3 sets - 10 reps - Shoulder Flexion Wall Slide with Towel  - 2 x daily - 5 x weekly - 3 sets - 10 reps  Added 04/02/2022: - Shoulder External Rotation and Scapular Retraction with Resistance  - 1 x daily - 7 x weekly - 3 sets - 10 reps - 3 seconds hold - Shoulder Scaption AAROM with Dowel  - 1 x daily - 7 x weekly - 3 sets - 10 reps - 5 seconds hold - Standing Shoulder Row with Anchored Resistance  - 1 x daily - 7 x weekly - 3 sets - 10 reps - 3 seconds hold   ASSESSMENT:   CLINICAL IMPRESSION: Patient presents to PT with continued pain in his Rt shoulder and reports that he was doing a lot of lifting yesterday that has left him sore. Session today continued to focus on AROM and strengthening for Rt shoulder against gravity. He still remains somewhat limited by pain and compensation patterns, especially with overhead  motions. He does report improvement since beginning therapy, stating that the pain that used to radiate into his biceps has centralized more to just the anterior shoulder. He reports an increase in pain with supine shoulder flexion, terminated exercise early and transitioned patient to MHP to decrease tension and pain. Patient continues to benefit from skilled PT services and should be progressed as able to improve functional independence.   OBJECTIVE IMPAIRMENTS decreased knowledge of condition, decreased ROM, decreased strength, impaired UE functional use, postural dysfunction, and pain.    ACTIVITY LIMITATIONS carrying, lifting, dressing, and reach over head   PERSONAL FACTORS Age, Fitness, and 1 comorbidity: R biceps tendon rupture are also affecting patient's functional outcome.        GOALS: Goals reviewed with patient? Yes   SHORT TERM GOALS: Target date: 03/31/22     Patient to demonstrate independence in HEP  Baseline:[email or web address] 04/02/2022: Pt reports daily adherence to his HEP Goal status: ACHIEVED   2.  Decrease worst pain to 8/10 Baseline: 10/10 04/02/2022: 7/10 Goal status: ACHIEVED   3.  Increase AROM of flexion and abduction to 120d Baseline: 90d each motion 04/02/2022: 123d flexion, 105d abduction Goal status: IN PROGRESS     LONG TERM GOALS: Target date: 04/21/2022     160d AROM flexion an abduction Baseline: 90d each 04/02/2022: 123d flexion, 105d abduction Goal status: IN PROGRESS   2.  4+/5 R shoulder/elbow strength Baseline:  MMT Right eval Left eval  Shoulder flexion 4p!    Shoulder extension 5    Shoulder abduction 4p!    Shoulder adduction      Shoulder internal rotation 4    Shoulder external rotation 4    Middle trapezius      Lower trapezius      Elbow flexion 4-p!    Elbow extension 4      04/02/2022: 4+/5 to 5/5 elbow and shoulder strength  Goal status: ACHIEVED   3.  Increase FOTO to 65 Baseline: 36  04/02/2022: 60 Goal status: IN  PROGRESS   4.  4/10 worst pain Baseline: 10/10 04/02/2022: 7/10 Goal status: IN PROGRESS  PLAN: PT FREQUENCY: 1-2x/week   PT DURATION: 6 weeks   PLANNED INTERVENTIONS: Therapeutic exercises, Therapeutic activity, Neuromuscular re-education, Balance training, Gait training, Patient/Family education, Self Care, Joint mobilization, Manual therapy, and Re-evaluation   PLAN FOR NEXT SESSION: ROM, RC and shoulder strengthening, posture retraining, functional tasks, HEP review and update.  Margarette Canada, PTA 04/09/22 9:54 AM

## 2022-04-09 ENCOUNTER — Ambulatory Visit: Payer: Medicare Other

## 2022-04-09 DIAGNOSIS — M25511 Pain in right shoulder: Secondary | ICD-10-CM | POA: Diagnosis not present

## 2022-04-09 DIAGNOSIS — S46011A Strain of muscle(s) and tendon(s) of the rotator cuff of right shoulder, initial encounter: Secondary | ICD-10-CM | POA: Diagnosis not present

## 2022-04-13 ENCOUNTER — Encounter: Payer: Medicare Other | Admitting: Internal Medicine

## 2022-04-13 ENCOUNTER — Ambulatory Visit: Payer: Medicare Other

## 2022-04-13 DIAGNOSIS — M25511 Pain in right shoulder: Secondary | ICD-10-CM

## 2022-04-13 DIAGNOSIS — S46011A Strain of muscle(s) and tendon(s) of the rotator cuff of right shoulder, initial encounter: Secondary | ICD-10-CM | POA: Diagnosis not present

## 2022-04-13 NOTE — Therapy (Signed)
OUTPATIENT PHYSICAL THERAPY TREATMENT NOTE   Patient Name: Michael Snyder MRN: [id number] DOB:Oct 03, 1950, 71 y.o., male Today's Date: 04/13/2022  PCP: Myrlene Broker, MD REFERRING PROVIDER: Richardean Sale, DO  END OF SESSION:   PT End of Session - 04/13/22 0832     Visit Number 10    Number of Visits 12    Date for PT Re-Evaluation 05/05/22    Authorization Type U[employer]    Authorization Time Period FOTO v6, v10    Progress Note Due on Visit 10    PT Start Time 0830    PT Stop Time 0915    PT Time Calculation (min) 45 min    Activity Tolerance Patient tolerated treatment well;Patient limited by pain    Behavior During Therapy WFL for tasks assessed/performed              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   Pacemaker    Medtronic Adapta [id number]   Seizure disorder (HCC)    Seizures (HCC)    last time in 1990's   Past Surgical History:  Procedure Laterality Date   LACERATION REPAIR  1980   scalp   PACEMAKER LEAD REMOVAL N/A 03/23/2014   Procedure: PACEMAKER LEAD REMOVAL;  Surgeon: Marinus Maw, MD;  Location: [facility] OR;  Service: Cardiovascular;  Laterality: N/A;   PTDVP     7/08 medtronic DDD   Patient Active Problem List   Diagnosis Date Noted   Acute pain of right shoulder 02/24/2022   Stage 3b chronic kidney disease (HCC) 09/12/2021   Hypothyroidism 08/03/2020   Constipation 07/18/2020   De Quervain's tenosynovitis, left 03/14/2019   Left wrist pain 09/23/2018   Hepatitis C virus infection cured after antiviral drug therapy 12/24/2015   Cataract, right eye 11/13/2014   Cataract, left eye 10/18/2014   Complications, pacemaker cardiac, 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/2014   Atrial fibrillation (HCC) 11/17/2012   Cardiac pacemaker MDT 11/26/2011    Routine health maintenance 06/16/2011   Essential hypertension 06/01/2008   Diabetes mellitus type 2 with complications (HCC) 03/26/2007   SICK SINUS SYNDROME 03/26/2007   SEIZURE DISORDER 03/26/2007    REFERRING DIAG: M25.511,G89.29 (ICD-10-CM) - Chronic right shoulder pain S46.011A (ICD-10-CM) - Rotator cuff strain, right, initial encounter   THERAPY DIAG: Rotator cuff strain, right, initial encounter    Rationale for Evaluation and Treatment Rehabilitation  PERTINENT HISTORY: Chronic right shoulder pain 2. Rotator cuff strain, right, initial encounter -Chronic with exacerbation, initial sports medicine visit - Acutely worsening right shoulder pain after incident with patient moving furniture with history of intermittent shoulder pain - Consistent with rotator cuff pathology.  I do not think the patient has a complete tear of any rotator cuff muscle based on maintained strength, however he may have large degree of partial tears versus grade 1 strain - Offered subacromial CSI, however patient stated that prior CSI significantly raise his blood glucose with history of DM type II, so patient wishes to avoid CSI - Start meloxicam 15 mg daily x2 weeks.  If still having pain after 2 weeks, Do not to use additional NSAIDs while taking meloxicam.  May use Tylenol [id number] mg 2 to 3 times a day for breakthrough pain.  We will only use meloxicam for a short 2-week course and no longer due to patient's comorbidities - Start HEP and physical therapy for shoulder and rotator cuff - Ambulatory referral to Physical Therapy -X-ray obtained in clinic.  My interpretation: No acute fracture or dislocation.  Hyper sclerosis and glenoid and AC joint arthritis  PRECAUTIONS: R biceps tendon tear, non-functional L shoulder due to trauma  SUBJECTIVE: Reports shoulder feels stronger and he is able to do more with it, pain rated at 6/10 on average.  PAIN:  Are you having pain? Yes: NPRS scale: 7/10 Pain  location: Rt shoulder Pain description: ache Aggravating factors: OH reaching Relieving factors: OTC ointment   OBJECTIVE: (objective measures completed at initial evaluation unless otherwise dated)  DIAGNOSTIC FINDINGS:  X-ray obtained in clinic.  My interpretation: No acute fracture or dislocation.  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  Shoulder extension       Shoulder abduction 90/150   105p!  Shoulder adduction       Shoulder internal rotation 70/80   75  Shoulder external rotation 60/70   55  Elbow flexion WFL     Elbow extension [facility]     Wrist flexion       Wrist extension       Wrist ulnar deviation       Wrist radial deviation       Wrist pronation       Wrist supination       (Blank rows = not tested)   UPPER EXTREMITY MMT:   MMT Right eval Left eval Right 04/02/2022  Shoulder flexion 4p!   4+p!  Shoulder extension 5   5  Shoulder abduction 4p!   5  Shoulder adduction       Shoulder internal rotation 4   5  Shoulder external rotation 4   5  Middle trapezius       Lower trapezius       Elbow flexion 4-p!   5  Elbow extension 4   5  Wrist flexion       Wrist extension       Wrist ulnar deviation       Wrist radial deviation       Wrist pronation       Wrist supination       Grip strength (lbs)       (Blank rows = not tested)   SHOULDER SPECIAL TESTS:            Impingement tests: Neer impingement test: positive  and Hawkins/Kennedy impingement test: positive             Rotator cuff assessment: Drop arm test: negative, Empty can test: positive , and Full can test: positive              Biceps assessment:  R "Popeye" deformity   JOINT MOBILITY TESTING:  Not tested  PALPATION:  TTP R biceps tendon proximal             TODAY'S TREATMENT:  [facility] Adult PT Treatment:                                                DATE: 04/13/22 Therapeutic Exercise: UBE 3/3 min B arms(BWD painful B) Standing ball roll flexion 15x 1000g ball  Placing 1000g ball on 4 OH and 2 lower shelves 5x each Ball pushups on wall 15x Seated ER R YTB 15x Prone scaption 2# R 15x Prone flexion 2# 15x Prone extension 2# 15x Prone hor abd 2# 15x Supine press R 2000g ball 15x Supine flexion R 2000g ball 15x  [facility] Adult PT Treatment:                                                DATE: 04/09/2022 Therapeutic Exercise: UBE 5 min B arms Standing ball roll flexion 15x Standing low rows with blue TB 2x10 Standing BIL scaption lift with 2# dumbbells to roughly 60 degrees of elevation with back to wall x15 Seated BIL shoulder ER with scapular retraction with GTB 3x10  Seated elbow flexion 2# R pronated/supinated 2x10 each Seated OH press 2# 15x R Seated pro/sup 2# 20/20 Supine press R 3# 2x10 Supine flexion R 3# 1x10, 2# 1x5 Modalities: MHP to Rt shoulder, pt in sitting, x8 mins   [facility] Adult PT Treatment:                                                DATE: 04/06/22 Therapeutic Exercise: UBE 5 min B arms Seated scaption 2# 15x R Seated elbow flexion 2# 15x R Seated elbow extension YTB 15x R Seated OH press 2# 15x R Seated ER YTB 15x Supine elbow flexion 15x R 2# pronated Supine elbow flexion 2# 15x R supinated Supine pro/sup 2# 15/15 Supine press R 2# 15x Supine flexion R 2# 15x Prone scaption 2# R 15x Prone flexion 2# 15x Prone extension 2# 15x Prone hor abd 2# 15x Standing ball roll flexion 15x Manual Therapy: PROM  PNF D1 F/E 15x  [facility] Adult PT Treatment:                                                DATE: 04/02/2022 Therapeutic Exercise: Seated BIL shoulder ER with scapular  retraction with GTB 3x10  Standing Rt shoulder scaption AAROM with dowel rod 2x10 with 5-sec holds at end range Standing low rows with blue TB in doorway 3x10 Standing BIL scaption lift with 2# dumbbells to roughly 60 degrees of elevation with back to wall 3x8 Corner pec stretch x47min Manual Therapy: N/A Neuromuscular re-ed: N/A Therapeutic Activity: Re-assessment of objective measures with pt education Re-administration of FOTO with pt education Update to HEP Modalities: N/A Self Care: N/A   [facility] Adult PT Treatment:  DATE: 03/30/22 Therapeutic Exercise: UBE 5 min B arms Supine elbow flexion R 1000g ball 2x12(supinated) Supine elbow flexion 1000g ball 2x12(pronated) Supine press R 1000g ball 2x12 Supine flexion R 1000g ball 2x12 L sidelie R ER 1000g 2x12 L sidelie abduction 1000g ball 2x12 Prone flexion 1000g ball 2x12 Prone extension 1000g ball 2x12 Prone hor abd 1000g ball 2x12 Manual Therapy: PROM  PNF D1 F/E 15x     PATIENT EDUCATION: Education details: Discussed eval findings, rehab rationale and POC and patient is in agreement  Person educated: Patient Education method: Explanation Education comprehension: verbalized understanding and needs further education     HOME EXERCISE PROGRAM: Access Code: [email or web address] URL: [email or web address] Date: 03/10/2022 Prepared by: Sharlynn Oliphant   Exercises - Supine Shoulder Press with Dowel  - 2 x daily - 5 x weekly - 3 sets - 10 reps - Supine Shoulder External Rotation with Dowel  - 2 x daily - 5 x weekly - 3 sets - 10 reps - Shoulder Flexion Wall Slide with Towel  - 2 x daily - 5 x weekly - 3 sets - 10 reps  Added 04/02/2022: - Shoulder External Rotation and Scapular Retraction with Resistance  - 1 x daily - 7 x weekly - 3 sets - 10 reps - 3 seconds hold - Shoulder Scaption AAROM with Dowel  - 1 x daily - 7 x weekly - 3 sets - 10 reps - 5 seconds hold - Standing  Shoulder Row with Anchored Resistance  - 1 x daily - 7 x weekly - 3 sets - 10 reps - 3 seconds hold   ASSESSMENT:   CLINICAL IMPRESSION: Pain levels have decreased slightly, FOTO goal met.  Introduced functional tasks of weighted reaching to all cabinet heights, high and low.  Shoulder discomfort persists due to soft tissue damage.  OBJECTIVE IMPAIRMENTS decreased knowledge of condition, decreased ROM, decreased strength, impaired UE functional use, postural dysfunction, and pain.    ACTIVITY LIMITATIONS carrying, lifting, dressing, and reach over head   PERSONAL FACTORS Age, Fitness, and 1 comorbidity: R biceps tendon rupture are also affecting patient's functional outcome.        GOALS: Goals reviewed with patient? Yes   SHORT TERM GOALS: Target date: 03/31/22     Patient to demonstrate independence in HEP  Baseline:[email or web address] 04/02/2022: Pt reports daily adherence to his HEP Goal status: ACHIEVED   2.  Decrease worst pain to 8/10 Baseline: 10/10 04/02/2022: 7/10 Goal status: ACHIEVED   3.  Increase AROM of flexion and abduction to 120d Baseline: 90d each motion 04/02/2022: 123d flexion, 105d abduction Goal status: IN PROGRESS     LONG TERM GOALS: Target date: 04/21/2022     160d AROM flexion an abduction Baseline: 90d each 04/02/2022: 123d flexion, 105d abduction Goal status: IN PROGRESS   2.  4+/5 R shoulder/elbow strength Baseline:  MMT Right eval Left eval  Shoulder flexion 4p!    Shoulder extension 5    Shoulder abduction 4p!    Shoulder adduction      Shoulder internal rotation 4    Shoulder external rotation 4    Middle trapezius      Lower trapezius      Elbow flexion 4-p!    Elbow extension 4      04/02/2022: 4+/5 to 5/5 elbow and shoulder strength  Goal status: ACHIEVED   3.  Increase FOTO to 65 Baseline: 36  04/02/2022: 60; 04/13/22 65 Goal status: Met   4.  4/10 worst pain Baseline: 10/10  04/02/2022: 7/10 Goal status: IN PROGRESS        PLAN: PT FREQUENCY: 1-2x/week   PT DURATION: 6 weeks   PLANNED INTERVENTIONS: Therapeutic exercises, Therapeutic activity, Neuromuscular re-education, Balance training, Gait training, Patient/Family education, Self Care, Joint mobilization, Manual therapy, and Re-evaluation   PLAN FOR NEXT SESSION: ROM, RC and shoulder strengthening, posture retraining, functional tasks, HEP review and update.  Margarette Canada, PTA 04/13/22 9:13 AM

## 2022-04-13 NOTE — Progress Notes (Deleted)
Patient Care Team: Hoyt Koch, MD as PCP - General (Internal Medicine) Deboraha Sprang, MD as PCP - Electrophysiology (Cardiology) Szabat, Darnelle Maffucci, [facility] (Inactive) (Pharmacist)   HPI  Michael Snyder is a 71 y.o. male seen in followup for pacemaker implanted 2008 because of symptomatic sinus node dysfunction and profound nocturnal bradycardia not felt to be related to sleep apnea. He has had no further symptoms of lightheadedness following device implantation.     Evaluation of his left ventricular function by echo in 2008 demonstrated an EF of 55-65%.   Nonsustained ventricular tachycardia was noted on his device.    In 9/15 he underwent extraction of his previously implanted RV lead which become dislodged with  insertion of a new lead  And he has a history of a retinal event that was thought to be possibly thromboembolic. He had atrial high rate episodes detected on his device and the concern was that this represented atrial fibrillation. He was started on Rivaroxaban which he tolerated but has intercurrently stopped; ASA is his current choice    No interval Afib of which he is aware  The patient denies chest pain, shortness of breath, nocturnal dyspnea, orthopnea or peripheral edema.  There have been no palpitations, lightheadedness or syncope.    BS is high  May need insulin   Date Cr K  10/18 1.6 4.4   10/20 1.29 3.7     Past Medical History:  Diagnosis Date   Alcohol abuse    Arthritis    Diabetes mellitus, type 2 ([place])    Head injury, closed, without LOC 1980s   History of alcohol abuse    Hyperlipidemia    Hypertension    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   Pacemaker    Medtronic Adapta ADDR01   Seizure disorder ([place])    Seizures ([place])    last time in 1990's    Past Surgical History:  Procedure Laterality Date   LACERATION REPAIR  1980   scalp   PACEMAKER LEAD REMOVAL N/A 03/23/2014   Procedure: PACEMAKER LEAD REMOVAL;  Surgeon:  Evans Lance, MD;  Location: [facility];  Service: Cardiovascular;  Laterality: N/A;   PTDVP     7/08 medtronic DDD    Current Outpatient Medications  Medication Sig Dispense Refill   aspirin EC 81 MG tablet Take 81 mg daily by mouth.     atorvastatin (LIPITOR) 20 MG tablet Take 1 tablet (20 mg total) by mouth daily. 90 tablet 3   Blood Pressure Monitoring (BLOOD PRESSURE KIT) DEVI 1 kit by Does not apply route daily. 1 each 0   brimonidine (ALPHAGAN) 0.2 % ophthalmic solution SMARTSIG:In Eye(s)     Calcium Carbonate-Vitamin D (CALTRATE 600+D) 600-400 MG-UNIT per tablet Take 1 tablet by mouth 2 (two) times daily. 180 tablet 3   Cholecalciferol (VITAMIN D) 50 MCG (2000 UT) tablet Take 2,000 Units by mouth daily.     Continuous Blood Gluc Receiver (DEXCOM G6 RECEIVER) DEVI Use as directed to check blood sugar. 1 each 0   Continuous Blood Gluc Sensor (DEXCOM G6 SENSOR) MISC Change every 10 days 9 each 3   Continuous Blood Gluc Transmit (DEXCOM G6 TRANSMITTER) MISC Change every 3 months 1 each 3   dapagliflozin propanediol (FARXIGA) 10 MG TABS tablet Take 10 mg by mouth daily.     hydrochlorothiazide (MICROZIDE) 12.5 MG capsule Take 1 capsule (12.5 mg total) by mouth daily. 90 capsule 3  Insulin Disposable Pump (OMNIPOD 5 G6 POD, GEN 5,) MISC 1 Device by Does not apply route every 3 (three) days. 2 each 3   insulin glargine, 1 Unit Dial, (TOUJEO SOLOSTAR) 300 UNIT/ML Solostar Pen Inject 35 Units into the skin daily. (Patient not taking: Reported on 03/04/2022) 6 mL 3   insulin lispro (HUMALOG) 100 UNIT/ML injection Inject 50 units in pump daily 20 mL 2   Insulin Pen Needle (PEN NEEDLES) 32G X 4 MM MISC Use as Directed 3 times daily to inject insulin 100 each 5   Lancets (ONETOUCH DELICA PLUS LANCET33G) MISC CHECK BLOOD SUGAR TWICE  DAILY 200 each 2   latanoprost (XALATAN) 0.005 % ophthalmic solution Place 1 drop into both eyes at bedtime.     levothyroxine (SYNTHROID) 25 MCG tablet Take 1 tablet (25  mcg total) by mouth daily before breakfast. 90 tablet 3   LINZESS 145 MCG CAPS capsule TAKE 1 CAPSULE BY MOUTH DAILY  BEFORE BREAKFAST 90 capsule 3   meloxicam (MOBIC) 15 MG tablet Take 1 tablet (15 mg total) by mouth daily. 14 tablet 0   meloxicam (MOBIC) 7.5 MG tablet Take 7.5 mg by mouth daily.     metoprolol succinate (TOPROL-XL) 25 MG 24 hr tablet TAKE 1 TABLET BY MOUTH ONCE  DAILY 90 tablet 0   Multiple Vitamin (MULTIVITAMIN WITH MINERALS) TABS tablet Take 1 tablet by mouth daily.     Omega-3 Fatty Acids (FISH OIL) 300 MG CAPS Take 1 capsule (300 mg total) by mouth daily. 90 capsule 3   ONETOUCH VERIO test strip TEST TWICE DAILY 200 strip 3   pioglitazone (ACTOS) 30 MG tablet Take 1 tablet (30 mg total) by mouth daily. 90 tablet 3   polyethylene glycol powder (GLYCOLAX/MIRALAX) 17 GM/SCOOP powder Take 17 g by mouth 2 (two) times daily as needed. 255 g 0   potassium chloride SA (KLOR-CON M) 20 MEQ tablet TAKE 1 TABLET BY MOUTH  TWICE DAILY (Patient taking differently: Take 10 mEq by mouth 2 (two) times daily.) 180 tablet 2   RYBELSUS 14 MG TABS TAKE 1 TABLET BY MOUTH DAILY 90 tablet 3   triamcinolone ointment (KENALOG) 0.5 % APPLY TO AFFECTED AREA(S)  TOPICALLY TWICE DAILY 90 g 0   No current facility-administered medications for this visit.    Allergies  Allergen Reactions   Clindamycin/Lincomycin Swelling    Possible facial and lip swelling   Ace Inhibitors     Facial and lip swelling possibly associated    Review of Systems negative except from HPI and PMH  Physical Exam There were no vitals taken for this visit. Well developed and well nourished in no acute distress HENT normal Neck supple with JVP-flat Clear Device pocket well healed; without hematoma or erythema.  There is no tethering  Regular rate and rhythm, no  murmur Abd-soft with active BS No Clubbing cyanosis   edema Skin-warm and dry A & Oriented  Grossly normal sensory and motor function  ECG sinus @  72 29/07/36   Assessment and  Plan  Sinus bradycardia  1 AV block stable  TIA  No intercurrent events or detected atrial fibrillation   Pacemaker-Medtronic  The patient's device was interrogated.  The information was reviewed. No changes were made in the programming.    Hypertension  Ventricular tachycardia nonsustained  BP well controlled        Stable continue current meds 

## 2022-04-15 NOTE — Therapy (Signed)
OUTPATIENT PHYSICAL THERAPY TREATMENT NOTE   Patient Name: Michael Snyder MRN: [id number] DOB:06/22/1951, 71 y.o., male Today's Date: 04/16/2022  PCP: Crawford, Elizabeth A, MD REFERRING PROVIDER: Jackson, Benjamin, DO  END OF SESSION:   PT End of Session - 04/16/22 0919     Visit Number 11    Number of Visits 12    Date for PT Re-Evaluation 05/05/22    Authorization Type UHC    Authorization Time Period FOTO v6, v10    Progress Note Due on Visit 10    PT Start Time 0917    PT Stop Time 0957    PT Time Calculation (min) 40 min               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   Pacemaker    Medtronic Adapta [id number]   Seizure disorder (HCC)    Seizures (HCC)    last time in 1990's   Past Surgical History:  Procedure Laterality Date   LACERATION REPAIR  1980   scalp   PACEMAKER LEAD REMOVAL N/A 03/23/2014   Procedure: PACEMAKER LEAD REMOVAL;  Surgeon: Gregg W Taylor, MD;  Location: [facility] OR;  Service: Cardiovascular;  Laterality: N/A;   PTDVP     7/08 medtronic DDD   Patient Active Problem List   Diagnosis Date Noted   Acute pain of right shoulder 02/24/2022   Stage 3b chronic kidney disease (HCC) 09/12/2021   Hypothyroidism 08/03/2020   Constipation 07/18/2020   De Quervain's tenosynovitis, left 03/14/2019   Left wrist pain 09/23/2018   Hepatitis C virus infection cured after antiviral drug therapy 12/24/2015   Cataract, right eye 11/13/2014   Cataract, left eye 10/18/2014   Complications, pacemaker cardiac, 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/2014   Atrial fibrillation (HCC) 11/17/2012   Cardiac pacemaker MDT 11/26/2011   Routine health maintenance 06/16/2011   Essential hypertension 06/01/2008   Diabetes mellitus type 2 with complications (HCC) 03/26/2007    SICK SINUS SYNDROME 03/26/2007   SEIZURE DISORDER 03/26/2007    REFERRING DIAG: M25.511,G89.29 (ICD-10-CM) - Chronic right shoulder pain S46.011A (ICD-10-CM) - Rotator cuff strain, right, initial encounter   THERAPY DIAG: Rotator cuff strain, right, initial encounter    Rationale for Evaluation and Treatment Rehabilitation  PERTINENT HISTORY: Chronic right shoulder pain 2. Rotator cuff strain, right, initial encounter -Chronic with exacerbation, initial sports medicine visit - Acutely worsening right shoulder pain after incident with patient moving furniture with history of intermittent shoulder pain - Consistent with rotator cuff pathology.  I do not think the patient has a complete tear of any rotator cuff muscle based on maintained strength, however he may have large degree of partial tears versus grade 1 strain - Offered subacromial CSI, however patient stated that prior CSI significantly raise his blood glucose with history of DM type II, so patient wishes to avoid CSI - Start meloxicam 15 mg daily x2 weeks.  If still having pain after 2 weeks, Do not to use additional NSAIDs while taking meloxicam.  May use Tylenol 500-1000 mg 2 to 3 times a day for breakthrough pain.  We will only use meloxicam for a short 2-week course and no longer due to patient's comorbidities - Start HEP and   physical therapy for shoulder and rotator cuff - Ambulatory referral to Physical Therapy -X-ray obtained in clinic.  My interpretation: No acute fracture or dislocation.  Hyper sclerosis and glenoid and AC joint arthritis  PRECAUTIONS: R biceps tendon tear, non-functional L shoulder due to trauma  SUBJECTIVE: Patient reports pain in both shoulders today.  PAIN:  Are you having pain? Yes: NPRS scale: 6/10 Pain location: Rt shoulder Pain description: ache Aggravating factors: OH reaching Relieving factors: OTC ointment   OBJECTIVE: (objective measures completed at initial evaluation unless otherwise  dated)  DIAGNOSTIC FINDINGS:  X-ray obtained in clinic.  My interpretation: No acute fracture or dislocation.  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  Shoulder extension       Shoulder abduction 90/150   105p!  Shoulder adduction       Shoulder internal rotation 70/80   75  Shoulder external rotation 60/70   55  Elbow flexion WFL     Elbow extension [facility]     Wrist flexion       Wrist extension       Wrist ulnar deviation       Wrist radial deviation       Wrist pronation       Wrist supination       (Blank rows = not tested)   UPPER EXTREMITY MMT:   MMT Right eval Left eval Right 04/02/2022  Shoulder flexion 4p!   4+p!  Shoulder extension 5   5  Shoulder abduction 4p!   5  Shoulder adduction       Shoulder internal rotation 4   5  Shoulder external rotation 4   5  Middle trapezius       Lower trapezius       Elbow flexion 4-p!   5  Elbow extension 4   5  Wrist flexion       Wrist extension       Wrist ulnar deviation       Wrist radial deviation       Wrist pronation       Wrist supination       Grip strength (lbs)       (Blank rows = not tested)   SHOULDER SPECIAL TESTS:            Impingement tests: Neer impingement test: positive  and Hawkins/Kennedy impingement test: positive             Rotator cuff assessment: Drop arm test: negative, Empty can test: positive , and Full can test: positive             Biceps assessment:  R "Popeye" deformity   JOINT MOBILITY TESTING:  Not tested   PALPATION:  TTP R biceps tendon proximal             TODAY'S TREATMENT:  [facility] Adult PT Treatment:  DATE: 04/15/2022 Therapeutic Exercise: UBE x5 min fwd Standing ball roll flexion 15x 1000g ball  Placing 2# weight on bottom/middle shelf x10 Rt flexion Ball pushups on wall 15x Seated ER R YTB 2x10 Prone scaption 2# R 15x Prone flexion 2# 15x Prone extension 2# 15x Prone hor abd 2# 15x Supine press R 2000g ball 2x10 Supine flexion R 1000g ball 2x10 Seated elbow flexion/extension 3# Rt 3x10  [facility] Adult PT Treatment:                                                DATE: 04/13/22 Therapeutic Exercise: UBE 3/3 min B arms(BWD painful B) Standing ball roll flexion 15x 1000g ball  Placing 1000g ball on 4 OH and 2 lower shelves 5x each Ball pushups on wall 15x Seated ER R YTB 15x Prone scaption 2# R 15x Prone flexion 2# 15x Prone extension 2# 15x Prone hor abd 2# 15x Supine press R 2000g ball 15x Supine flexion R 2000g ball 15x  [facility] Adult PT Treatment:                                                DATE: 04/09/2022 Therapeutic Exercise: UBE 5 min B arms Standing ball roll flexion 15x Standing low rows with blue TB 2x10 Standing BIL scaption lift with 2# dumbbells to roughly 60 degrees of elevation with back to wall x15 Seated BIL shoulder ER with scapular retraction with GTB 3x10  Seated elbow flexion 2# R pronated/supinated 2x10 each Seated OH press 2# 15x R Seated pro/sup 2# 20/20 Supine press R 3# 2x10 Supine flexion R 3# 1x10, 2# 1x5 Modalities: MHP to Rt shoulder, pt in sitting, x8 mins   [facility] Adult PT Treatment:                                                DATE: 04/06/22 Therapeutic Exercise: UBE 5 min B arms Seated scaption 2# 15x R Seated elbow flexion 2# 15x R Seated elbow extension YTB 15x R Seated OH press 2# 15x R Seated ER YTB 15x Supine elbow flexion 15x R 2# pronated Supine elbow flexion 2# 15x R supinated Supine pro/sup 2# 15/15 Supine press R 2# 15x Supine flexion R 2# 15x Prone scaption 2# R 15x Prone  flexion 2# 15x Prone extension 2# 15x Prone hor abd 2# 15x Standing ball roll flexion 15x Manual Therapy: PROM  PNF D1 F/E 15x    PATIENT EDUCATION: Education details: Discussed eval findings, rehab rationale and POC and patient is in agreement  Person educated: Patient Education method: Explanation Education comprehension: verbalized understanding and needs further education     HOME EXERCISE PROGRAM: Access Code: [email or web address] URL: [email or web address] Date: 03/10/2022 Prepared by: Jeffrey Ziemba   Exercises - Supine Shoulder Press with Dowel  - 2 x daily - 5 x weekly - 3 sets - 10 reps - Supine Shoulder External Rotation with Dowel  - 2 x daily - 5 x weekly - 3 sets - 10 reps - Shoulder Flexion Wall Slide with Towel  - 2 x daily - 5 x weekly -   3 sets - 10 reps  Added 04/02/2022: - Shoulder External Rotation and Scapular Retraction with Resistance  - 1 x daily - 7 x weekly - 3 sets - 10 reps - 3 seconds hold - Shoulder Scaption AAROM with Dowel  - 1 x daily - 7 x weekly - 3 sets - 10 reps - 5 seconds hold - Standing Shoulder Row with Anchored Resistance  - 1 x daily - 7 x weekly - 3 sets - 10 reps - 3 seconds hold   ASSESSMENT:   CLINICAL IMPRESSION: Patient presents to PT with pain in BIL shoulders today and states he has been working this week doing some painting and digging. Session today continued to focus on RTC and periscapular strengthening as well as functional lifting into cabinets with cues to decrease compensation. Unable to perform exercises in sidelying today due to Dexcom. Patient was able to tolerate all prescribed exercises with no adverse effects. Patient continues to benefit from skilled PT services and should be progressed as able to improve functional independence.    OBJECTIVE IMPAIRMENTS decreased knowledge of condition, decreased ROM, decreased strength, impaired UE functional use, postural dysfunction, and pain.    ACTIVITY LIMITATIONS  carrying, lifting, dressing, and reach over head   PERSONAL FACTORS Age, Fitness, and 1 comorbidity: R biceps tendon rupture are also affecting patient's functional outcome.        GOALS: Goals reviewed with patient? Yes   SHORT TERM GOALS: Target date: 03/31/22     Patient to demonstrate independence in HEP  Baseline:[email or web address] 04/02/2022: Pt reports daily adherence to his HEP Goal status: ACHIEVED   2.  Decrease worst pain to 8/10 Baseline: 10/10 04/02/2022: 7/10 Goal status: ACHIEVED   3.  Increase AROM of flexion and abduction to 120d Baseline: 90d each motion 04/02/2022: 123d flexion, 105d abduction Goal status: IN PROGRESS     LONG TERM GOALS: Target date: 04/21/2022     160d AROM flexion an abduction Baseline: 90d each 04/02/2022: 123d flexion, 105d abduction Goal status: IN PROGRESS   2.  4+/5 R shoulder/elbow strength Baseline:  MMT Right eval Left eval  Shoulder flexion 4p!    Shoulder extension 5    Shoulder abduction 4p!    Shoulder adduction      Shoulder internal rotation 4    Shoulder external rotation 4    Middle trapezius      Lower trapezius      Elbow flexion 4-p!    Elbow extension 4      04/02/2022: 4+/5 to 5/5 elbow and shoulder strength  Goal status: ACHIEVED   3.  Increase FOTO to 65 Baseline: 36  04/02/2022: 60; 04/13/22 65 Goal status: Met   4.  4/10 worst pain Baseline: 10/10 04/02/2022: 7/10 Goal status: IN PROGRESS       PLAN: PT FREQUENCY: 1-2x/week   PT DURATION: 6 weeks   PLANNED INTERVENTIONS: Therapeutic exercises, Therapeutic activity, Neuromuscular re-education, Balance training, Gait training, Patient/Family education, Self Care, Joint mobilization, Manual therapy, and Re-evaluation   PLAN FOR NEXT SESSION: ROM, RC and shoulder strengthening, posture retraining, functional tasks, HEP review and update.  Stephanie Williams, PTA 04/16/22 10:00 AM       

## 2022-04-16 ENCOUNTER — Ambulatory Visit: Payer: Medicare Other

## 2022-04-16 DIAGNOSIS — M25511 Pain in right shoulder: Secondary | ICD-10-CM | POA: Diagnosis not present

## 2022-04-16 DIAGNOSIS — S46011A Strain of muscle(s) and tendon(s) of the rotator cuff of right shoulder, initial encounter: Secondary | ICD-10-CM | POA: Diagnosis not present

## 2022-04-20 ENCOUNTER — Ambulatory Visit: Payer: Medicare Other

## 2022-04-20 DIAGNOSIS — S46011A Strain of muscle(s) and tendon(s) of the rotator cuff of right shoulder, initial encounter: Secondary | ICD-10-CM | POA: Diagnosis not present

## 2022-04-20 DIAGNOSIS — M25511 Pain in right shoulder: Secondary | ICD-10-CM | POA: Diagnosis not present

## 2022-04-20 NOTE — Therapy (Signed)
OUTPATIENT PHYSICAL THERAPY TREATMENT NOTE   Patient Name: Khaliq Turay MRN: [id number] DOB:[birth year], 71 y.o., male Today's Date: 04/20/2022  PCP: Hoyt Koch, MD REFERRING PROVIDER: Glennon Mac, DO  END OF SESSION:   PT End of Session - 04/20/22 0843     Visit Number 12    Number of Visits 13    Date for PT Re-Evaluation 05/05/22    Authorization Type UHC    Authorization Time Period FOTO v6, v10    Progress Note Due on Visit 10    PT Start Time 0831    PT Stop Time 0910    PT Time Calculation (min) 39 min    Activity Tolerance Patient tolerated treatment well;Patient limited by pain    Behavior During Therapy WFL for tasks assessed/performed                Past Medical History:  Diagnosis Date   Alcohol abuse    Arthritis    Diabetes mellitus, type 2 ([place])    Head injury, closed, without LOC 1980s   History of alcohol abuse    Hyperlipidemia    Hypertension    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   Pacemaker    Medtronic Adapta ADDR01   Seizure disorder ([place])    Seizures ([place])    last time in 1990's   Past Surgical History:  Procedure Laterality Date   LACERATION REPAIR  1980   scalp   PACEMAKER LEAD REMOVAL N/A 03/23/2014   Procedure: PACEMAKER LEAD REMOVAL;  Surgeon: Evans Lance, MD;  Location: [place];  Service: Cardiovascular;  Laterality: N/A;   PTDVP     7/08 medtronic DDD   Patient Active Problem List   Diagnosis Date Noted   Acute pain of right shoulder 02/24/2022   Stage 3b chronic kidney disease ([place]) 09/12/2021   Hypothyroidism 08/03/2020   Constipation 07/18/2020   De Quervain's tenosynovitis, left 03/14/2019   Left wrist pain 09/23/2018   Hepatitis C virus infection cured after antiviral drug therapy 12/24/2015   Cataract, right eye 11/13/2014   Cataract, left eye 87/56/4332   Complications, pacemaker cardiac, 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/2014   Atrial fibrillation ([place]) 11/17/2012   Cardiac pacemaker MDT 11/26/2011    Routine health maintenance 06/16/2011   Essential hypertension 06/01/2008   Diabetes mellitus type 2 with complications ([place]) 95/18/8416   SICK SINUS SYNDROME 03/26/2007   SEIZURE DISORDER 03/26/2007    REFERRING DIAG: M25.511,G89.29 (ICD-10-CM) - Chronic right shoulder pain S46.011A (ICD-10-CM) - Rotator cuff strain, right, initial encounter   THERAPY DIAG: Rotator cuff strain, right, initial encounter    Rationale for Evaluation and Treatment Rehabilitation  PERTINENT HISTORY: Chronic right shoulder pain 2. Rotator cuff strain, right, initial encounter -Chronic with exacerbation, initial sports medicine visit - Acutely worsening right shoulder pain after incident with patient moving furniture with history of intermittent shoulder pain - Consistent with rotator cuff pathology.  I do not think the patient has a complete tear of any rotator cuff muscle based on maintained strength, however he may have large degree of partial tears versus grade 1 strain - Offered subacromial CSI, however patient stated that prior CSI significantly raise his blood glucose with history of DM type II, so patient wishes to avoid CSI - Start meloxicam 15 mg daily x2 weeks.  If still having pain after 2 weeks, Do not to use additional NSAIDs while taking meloxicam.  May use Tylenol [phone] mg 2 to 3 times a day for breakthrough  pain.  We will only use meloxicam for a short 2-week course and no longer due to patient's comorbidities - Start HEP and physical therapy for shoulder and rotator cuff - Ambulatory referral to Physical Therapy -X-ray obtained in clinic.  My interpretation: No acute fracture or dislocation.  Hyper sclerosis and glenoid and AC joint arthritis  PRECAUTIONS: R biceps tendon tear, non-functional L shoulder due to trauma  SUBJECTIVE: Patient reports pain in both shoulders today.  PAIN:  Are you having pain? Yes: NPRS scale: 6/10 Pain location: Rt shoulder Pain description: ache Aggravating  factors: OH reaching Relieving factors: OTC ointment   OBJECTIVE: (objective measures completed at initial evaluation unless otherwise dated)  DIAGNOSTIC FINDINGS:  X-ray obtained in clinic.  My interpretation: No acute fracture or dislocation.  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  Shoulder extension       Shoulder abduction 90/150   105p!  Shoulder adduction       Shoulder internal rotation 70/80   75  Shoulder external rotation 60/70   55  Elbow flexion WFL     Elbow extension [facility]     Wrist flexion       Wrist extension       Wrist ulnar deviation       Wrist radial deviation       Wrist pronation       Wrist supination       (Blank rows = not tested)   UPPER EXTREMITY MMT:   MMT Right eval Left eval Right 04/02/2022  Shoulder flexion 4p!   4+p!  Shoulder extension 5   5  Shoulder abduction 4p!   5  Shoulder adduction       Shoulder internal rotation 4   5  Shoulder external rotation 4   5  Middle trapezius       Lower trapezius       Elbow flexion 4-p!   5  Elbow extension 4   5  Wrist flexion       Wrist extension       Wrist ulnar deviation       Wrist radial deviation       Wrist pronation       Wrist supination       Grip strength (lbs)       (Blank rows = not tested)   SHOULDER SPECIAL TESTS:            Impingement tests: Neer impingement test: positive  and Hawkins/Kennedy impingement test: positive             Rotator cuff assessment: Drop arm test: negative, Empty can test: positive , and Full can test: positive             Biceps assessment:  R "Popeye" deformity   JOINT  MOBILITY TESTING:  Not tested   PALPATION:  TTP R biceps tendon proximal  TODAY'S TREATMENT:  [facility] Adult PT Treatment:                                                DATE: 04/20/22 Therapeutic Exercise: UBE x5 min fwd Standing ball roll flexion 15x 2000g ball  Placing 2000g weight on bottom/middle/top shelf x10 ea., 5 positions Seated ER R YTB 2x15 Prone flexion 1000g 15x2 Prone extension 1000g 15x2 Prone hor abd 1000g 15x2 Supine press R 2000g ball 2x15 Supine flexion R 2000g ball 15x2   [facility] Adult PT Treatment:                                                DATE: 04/15/2022 Therapeutic Exercise: UBE x5 min fwd Standing ball roll flexion 15x 1000g ball  Placing 2# weight on bottom/middle shelf x10 Rt flexion Ball pushups on wall 15x Seated ER R YTB 2x10 Prone scaption 2# R 15x Prone flexion 2# 15x Prone extension 2# 15x Prone hor abd 2# 15x Supine press R 2000g ball 2x10 Supine flexion R 1000g ball 2x10 Seated elbow flexion/extension 3# Rt 3x10  [facility] Adult PT Treatment:                                                DATE: 04/13/22 Therapeutic Exercise: UBE 3/3 min B arms(BWD painful B) Standing ball roll flexion 15x 1000g ball  Placing 1000g ball on 4 OH and 2 lower shelves 5x each Ball pushups on wall 15x Seated ER R YTB 15x Prone scaption 2# R 15x Prone flexion 2# 15x Prone extension 2# 15x Prone hor abd 2# 15x Supine press R 2000g ball 15x Supine flexion R 2000g ball 15x     PATIENT EDUCATION: Education details: Discussed eval findings, rehab rationale and POC and patient is in agreement  Person educated: Patient Education method: Explanation Education comprehension: verbalized understanding and needs further education     HOME EXERCISE PROGRAM: Access Code: [email or web address] URL: [email or web address] Date: 03/10/2022 Prepared by: Sharlynn Oliphant   Exercises - Supine Shoulder Press with Dowel  - 2 x daily - 5 x weekly - 3 sets - 10  reps - Supine Shoulder External Rotation with Dowel  - 2 x daily - 5 x weekly - 3 sets - 10 reps - Shoulder Flexion Wall Slide with Towel  - 2 x daily - 5 x weekly - 3 sets - 10 reps  Added 04/02/2022: - Shoulder External Rotation and Scapular Retraction with Resistance  - 1 x daily - 7 x weekly - 3 sets - 10 reps - 3 seconds hold - Shoulder Scaption AAROM with Dowel  - 1 x daily - 7 x weekly - 3 sets - 10 reps - 5 seconds hold - Standing Shoulder Row with Anchored Resistance  - 1 x daily - 7 x weekly - 3 sets - 10 reps - 3 seconds hold   ASSESSMENT:   CLINICAL IMPRESSION: Today's session added reps to focus on endurance and stamina.  Increased resistance as noted.  Pain levels fluctuate from 5 at rest to 7 at worst.  Despite pain patient reports  he has returned to functional activities and has learned to compensate for his L shoulder pathology.  Discussed possible DC at next session and patient agreeable.    OBJECTIVE IMPAIRMENTS decreased knowledge of condition, decreased ROM, decreased strength, impaired UE functional use, postural dysfunction, and pain.    ACTIVITY LIMITATIONS carrying, lifting, dressing, and reach over head   PERSONAL FACTORS Age, Fitness, and 1 comorbidity: R biceps tendon rupture are also affecting patient's functional outcome.        GOALS: Goals reviewed with patient? Yes   SHORT TERM GOALS: Target date: 03/31/22     Patient to demonstrate independence in HEP  Baseline:[email or web address] 04/02/2022: Pt reports daily adherence to his HEP Goal status: ACHIEVED   2.  Decrease worst pain to 8/10 Baseline: 10/10 04/02/2022: 7/10 Goal status: ACHIEVED   3.  Increase AROM of flexion and abduction to 120d Baseline: 90d each motion 04/02/2022: 123d flexion, 105d abduction Goal status: IN PROGRESS     LONG TERM GOALS: Target date: 04/21/2022     160d AROM flexion an abduction Baseline: 90d each 04/02/2022: 123d flexion, 105d abduction Goal status: IN PROGRESS   2.   4+/5 R shoulder/elbow strength Baseline:  MMT Right eval Left eval  Shoulder flexion 4p!    Shoulder extension 5    Shoulder abduction 4p!    Shoulder adduction      Shoulder internal rotation 4    Shoulder external rotation 4    Middle trapezius      Lower trapezius      Elbow flexion 4-p!    Elbow extension 4      04/02/2022: 4+/5 to 5/5 elbow and shoulder strength  Goal status: ACHIEVED   3.  Increase FOTO to 65 Baseline: 36  04/02/2022: 60; 04/13/22 65 Goal status: Met   4.  4/10 worst pain Baseline: 10/10 04/02/2022: 7/10 Goal status: IN PROGRESS       PLAN: PT FREQUENCY: 1-2x/week   PT DURATION: 6 weeks   PLANNED INTERVENTIONS: Therapeutic exercises, Therapeutic activity, Neuromuscular re-education, Balance training, Gait training, Patient/Family education, Self Care, Joint mobilization, Manual therapy, and Re-evaluation   PLAN FOR NEXT SESSION: ROM, RC and shoulder strengthening, posture retraining, functional tasks, HEP review and update.  Leroy Sea PT  04/20/22 8:44 AM

## 2022-04-22 ENCOUNTER — Ambulatory Visit: Payer: Medicare Other

## 2022-04-22 DIAGNOSIS — S46011A Strain of muscle(s) and tendon(s) of the rotator cuff of right shoulder, initial encounter: Secondary | ICD-10-CM

## 2022-04-22 DIAGNOSIS — M25511 Pain in right shoulder: Secondary | ICD-10-CM

## 2022-04-22 NOTE — Therapy (Signed)
OUTPATIENT PHYSICAL THERAPY TREATMENT NOTE/DC SUMMARY   Patient Name: Michael Snyder MRN: [id number] DOB:09-26-1950, 71 y.o., male Today's Date: 04/22/2022  PCP: Hoyt Koch, MD REFERRING PROVIDER: Glennon Mac, DO PHYSICAL THERAPY DISCHARGE SUMMARY  Visits from Start of Care: 13  Current functional level related to goals / functional outcomes: Goals met   Remaining deficits: Pain   Education / Equipment: HEP   Patient agrees to discharge. Patient goals were met. Patient is being discharged due to being pleased with the current functional level.  END OF SESSION:   PT End of Session - 04/22/22 1006     Visit Number 13    Number of Visits 13    Date for PT Re-Evaluation 05/05/22    Authorization Type UHC    Authorization Time Period FOTO v6, v10    Progress Note Due on Visit 10    PT Start Time 1005    PT Stop Time 1045    PT Time Calculation (min) 40 min    Activity Tolerance Patient tolerated treatment well;Patient limited by pain    Behavior During Therapy WFL for tasks assessed/performed                Past Medical History:  Diagnosis Date   Alcohol abuse    Arthritis    Diabetes mellitus, type 2 ([place])    Head injury, closed, without LOC 1980s   History of alcohol abuse    Hyperlipidemia    Hypertension    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   Pacemaker    Medtronic Adapta ADDR01   Seizure disorder ([place])    Seizures ([place])    last time in 1990's   Past Surgical History:  Procedure Laterality Date   LACERATION REPAIR  1980   scalp   PACEMAKER LEAD REMOVAL N/A 03/23/2014   Procedure: PACEMAKER LEAD REMOVAL;  Surgeon: Evans Lance, MD;  Location: [place];  Service: Cardiovascular;  Laterality: N/A;   PTDVP     7/08 medtronic DDD   Patient Active Problem List   Diagnosis Date Noted   Acute pain of right shoulder 02/24/2022   Stage 3b chronic kidney disease ([place]) 09/12/2021   Hypothyroidism 08/03/2020   Constipation  07/18/2020   De Quervain's tenosynovitis, left 03/14/2019   Left wrist pain 09/23/2018   Hepatitis C virus infection cured after antiviral drug therapy 12/24/2015   Cataract, right eye 11/13/2014   Cataract, left eye 73/41/9379   Complications, pacemaker cardiac, 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/2014   Atrial fibrillation ([place]) 11/17/2012   Cardiac pacemaker MDT 11/26/2011   Routine health maintenance 06/16/2011   Essential hypertension 06/01/2008   Diabetes mellitus type 2 with complications ([place]) 02/40/9735   SICK SINUS SYNDROME 03/26/2007   SEIZURE DISORDER 03/26/2007    REFERRING DIAG: M25.511,G89.29 (ICD-10-CM) - Chronic right shoulder pain S46.011A (ICD-10-CM) - Rotator cuff strain, right, initial encounter   THERAPY DIAG: Rotator cuff strain, right, initial encounter    Rationale for Evaluation and Treatment Rehabilitation  PERTINENT HISTORY: Chronic right shoulder pain 2. Rotator cuff strain, right, initial encounter -Chronic with exacerbation, initial sports medicine visit - Acutely worsening right shoulder pain after incident with patient moving furniture with history of intermittent shoulder pain - Consistent with rotator cuff pathology.  I do not think the patient has a complete tear of any rotator cuff muscle based on maintained strength, however he may have large degree of partial tears versus grade 1 strain - Offered subacromial CSI, however patient stated that  prior CSI significantly raise his blood glucose with history of DM type II, so patient wishes to avoid CSI - Start meloxicam 15 mg daily x2 weeks.  If still having pain after 2 weeks, Do not to use additional NSAIDs while taking meloxicam.  May use Tylenol [phone] mg 2 to 3 times a day for breakthrough pain.  We will only use meloxicam for a short 2-week course and no longer due to patient's comorbidities - Start HEP and physical therapy for shoulder and rotator cuff - Ambulatory referral to Physical Therapy -X-ray obtained in  clinic.  My interpretation: No acute fracture or dislocation.  Hyper sclerosis and glenoid and AC joint arthritis  PRECAUTIONS: R biceps tendon tear, non-functional L shoulder due to trauma  SUBJECTIVE: Worst pain is 5/10 but this does not limit him form performing his work or ADL tasks.  PAIN:  Are you having pain? Yes: NPRS scale: 5/10 Pain location: Rt shoulder Pain description: ache Aggravating factors: OH reaching Relieving factors: OTC ointment   OBJECTIVE: (objective measures completed at initial evaluation unless otherwise dated)  DIAGNOSTIC FINDINGS:  X-ray obtained in clinic.  My interpretation: No acute fracture or dislocation.  Hyper sclerosis and glenoid and AC joint arthritis   PATIENT SURVEYS:  FOTO 36(65 predicted)  04/02/2022: 60% 04/13/22: 65   COGNITION:           Overall cognitive status: Within functional limits for tasks assessed                                  SENSATION: Not tested   POSTURE: Rounded shoulders    UPPER EXTREMITY ROM:    A/PROM Right eval Left eval Right 04/02/2022 R 04/22/22  Shoulder flexion 90/120   123p! 160/160d  Shoulder extension        Shoulder abduction 90/150   105p! 160/170  Shoulder adduction        Shoulder internal rotation 70/80   75 75  Shoulder external rotation 60/70   55 70  Elbow flexion WFL      Elbow extension WFL      Wrist flexion        Wrist extension        Wrist ulnar deviation        Wrist radial deviation        Wrist pronation        Wrist supination        (Blank rows = not tested)   UPPER EXTREMITY MMT:   MMT Right eval Left eval Right 04/02/2022  Shoulder flexion 4p!   4+  Shoulder extension 5   5  Shoulder abduction 4p!   5  Shoulder adduction       Shoulder internal rotation 4   5  Shoulder external rotation 4   5  Middle trapezius       Lower trapezius       Elbow flexion 4-p!   5  Elbow extension 4   5  Wrist flexion       Wrist extension       Wrist ulnar deviation        Wrist radial deviation       Wrist pronation       Wrist supination       Grip strength (lbs)       (Blank rows = not tested)   SHOULDER SPECIAL TESTS:  Impingement tests: Neer impingement test: positive  and Hawkins/Kennedy impingement test: positive             Rotator cuff assessment: Drop arm test: negative, Empty can test: positive , and Full can test: positive             Biceps assessment:  R "Popeye" deformity   JOINT MOBILITY TESTING:  Not tested   PALPATION:  TTP R biceps tendon proximal             TODAY'S TREATMENT:  [facility] Adult PT Treatment:                                                DATE: 04/22/22 Therapeutic Exercise: UBE x5 min fwd L1 Standing ball roll flexion 15x 2000g ball  Scaption on wall with towel 15x Placing 2000g weight on bottom/middle/top shelves x10 ea., 5 positions Seated ER R YTB x15 Prone flexion 1000g 15x Prone extension 1000g 15x Prone hor abd 1000g 15x Prone scaption 1000g 15x Supine press R 2000g ball x15 Supine flexion R 2000g ball 15x  [facility] Adult PT Treatment:                                                DATE: 04/20/22 Therapeutic Exercise: UBE x5 min fwd Standing ball roll flexion 15x 2000g ball  Placing 2000g weight on bottom/middle/top shelf x10 ea., 5 positions Seated ER R YTB 2x15 Prone flexion 1000g 15x2 Prone extension 1000g 15x2 Prone hor abd 1000g 15x2 Supine press R 2000g ball 2x15 Supine flexion R 2000g ball 15x2   [facility] Adult PT Treatment:                                                DATE: 04/15/2022 Therapeutic Exercise: UBE x5 min fwd Standing ball roll flexion 15x 1000g ball  Placing 2# weight on bottom/middle shelf x10 Rt flexion Ball pushups on wall 15x Seated ER R YTB 2x10 Prone scaption 2# R 15x Prone flexion 2# 15x Prone extension 2# 15x Prone hor abd 2# 15x Supine press R 2000g ball 2x10 Supine flexion R 1000g ball 2x10 Seated elbow flexion/extension 3# Rt 3x10  [facility] Adult PT  Treatment:                                                DATE: 04/13/22 Therapeutic Exercise: UBE 3/3 min B arms(BWD painful B) Standing ball roll flexion 15x 1000g ball  Placing 1000g ball on 4 OH and 2 lower shelves 5x each Ball pushups on wall 15x Seated ER R YTB 15x Prone scaption 2# R 15x Prone flexion 2# 15x Prone extension 2# 15x Prone hor abd 2# 15x Supine press R 2000g ball 15x Supine flexion R 2000g ball 15x     PATIENT EDUCATION: Education details: Discussed eval findings, rehab rationale and POC and patient is in agreement  Person educated: Patient Education method: Explanation Education comprehension: verbalized understanding and needs further education  HOME EXERCISE PROGRAM: Access Code: [email or web address] URL: [email or web address] Date: 04/22/2022 Prepared by: Gustavus Bryant  Exercises - Shoulder Flexion Wall Slide with Towel  - 1 x daily - 3 x weekly - 2 sets - 15 reps - Shoulder Scaption Wall Slide with Towel  - 1 x daily - 3 x weekly - 2 sets - 15 reps   ASSESSMENT:   CLINICAL IMPRESSION: Rehab goals met, maximum function regained.  Patient agreeable to DC.    OBJECTIVE IMPAIRMENTS decreased knowledge of condition, decreased ROM, decreased strength, impaired UE functional use, postural dysfunction, and pain.    ACTIVITY LIMITATIONS carrying, lifting, dressing, and reach over head   PERSONAL FACTORS Age, Fitness, and 1 comorbidity: R biceps tendon rupture are also affecting patient's functional outcome.        GOALS: Goals reviewed with patient? Yes   SHORT TERM GOALS: Target date: 03/31/22     Patient to demonstrate independence in HEP  Baseline:[email or web address] 04/02/2022: Pt reports daily adherence to his HEP Goal status: ACHIEVED   2.  Decrease worst pain to 8/10 Baseline: 10/10 04/02/2022: 7/10 Goal status: ACHIEVED   3.  Increase AROM of flexion and abduction to 120d Baseline: 150d flexion and abduction 04/02/2022: 123d flexion, 105d  abduction Goal status: Met     LONG TERM GOALS: Target date: 04/21/2022     160d AROM flexion an abduction Baseline: 90d each 04/02/2022: 123d flexion, 105d abduction A/PROM Right eval Left eval Right 04/02/2022 R 04/22/22  Shoulder flexion 90/120   123p! 160/160d  Shoulder extension        Shoulder abduction 90/150   105p! 160/170  Shoulder adduction        Shoulder internal rotation 70/80   75 75  Shoulder external rotation 60/70   55 70  Elbow flexion WFL      Elbow extension WFL      Wrist flexion        Wrist extension        Wrist ulnar deviation        Wrist radial deviation        Wrist pronation        Wrist supination         Goal status: Met   2.  4+/5 R shoulder/elbow strength Baseline:  MMT Right eval Left eval Right 04/02/2022  Shoulder flexion 4p!   4+  Shoulder extension 5   5  Shoulder abduction 4p!   5  Shoulder adduction       Shoulder internal rotation 4   5  Shoulder external rotation 4   5  Middle trapezius       Lower trapezius       Elbow flexion 4-p!   5  Elbow extension 4   5  Wrist flexion       Wrist extension       Wrist ulnar deviation       Wrist radial deviation       Wrist pronation       Wrist supination       Grip strength (lbs)         04/02/2022: 4+/5 to 5/5 elbow and shoulder strength  Goal status: ACHIEVED   3.  Increase FOTO to 65 Baseline: 36  04/02/2022: 60; 04/13/22 65 Goal status: Met   4.  4/10 worst pain Baseline: 10/10 04/02/2022: 7/10; 04/22/22 5/10 Goal status: Essentially met       PLAN: PT FREQUENCY: 1-2x/week   PT DURATION: 6 weeks  PLANNED INTERVENTIONS: Therapeutic exercises, Therapeutic activity, Neuromuscular re-education, Balance training, Gait training, Patient/Family education, Self Care, Joint mobilization, Manual therapy, and Re-evaluation   PLAN FOR NEXT SESSION: DC to self care  Leroy Sea PT  04/22/22 10:59 AM

## 2022-04-23 DIAGNOSIS — H401232 Low-tension glaucoma, bilateral, moderate stage: Secondary | ICD-10-CM | POA: Diagnosis not present

## 2022-04-23 DIAGNOSIS — H35373 Puckering of macula, bilateral: Secondary | ICD-10-CM | POA: Diagnosis not present

## 2022-04-23 DIAGNOSIS — Z961 Presence of intraocular lens: Secondary | ICD-10-CM | POA: Diagnosis not present

## 2022-05-10 ENCOUNTER — Other Ambulatory Visit: Payer: Self-pay | Admitting: Internal Medicine

## 2022-05-10 DIAGNOSIS — E876 Hypokalemia: Secondary | ICD-10-CM

## 2022-05-20 ENCOUNTER — Other Ambulatory Visit: Payer: Self-pay

## 2022-05-20 DIAGNOSIS — E118 Type 2 diabetes mellitus with unspecified complications: Secondary | ICD-10-CM

## 2022-05-20 MED ORDER — INSULIN LISPRO 100 UNIT/ML IJ SOLN: INTRAMUSCULAR | 2 refills | Status: DC

## 2022-05-25 ENCOUNTER — Ambulatory Visit (INDEPENDENT_AMBULATORY_CARE_PROVIDER_SITE_OTHER): Payer: Medicare Other

## 2022-05-25 ENCOUNTER — Other Ambulatory Visit: Payer: Self-pay

## 2022-05-25 DIAGNOSIS — I495 Sick sinus syndrome: Secondary | ICD-10-CM | POA: Diagnosis not present

## 2022-05-25 DIAGNOSIS — E118 Type 2 diabetes mellitus with unspecified complications: Secondary | ICD-10-CM

## 2022-05-25 MED ORDER — INSULIN LISPRO 100 UNIT/ML IJ SOLN: INTRAMUSCULAR | 2 refills | Status: DC

## 2022-05-26 ENCOUNTER — Telehealth: Payer: Self-pay

## 2022-05-26 NOTE — Telephone Encounter (Signed)
Forms and office notes have been faxed to [facility]

## 2022-05-30 ENCOUNTER — Other Ambulatory Visit: Payer: Self-pay | Admitting: Endocrinology

## 2022-05-30 DIAGNOSIS — E119 Type 2 diabetes mellitus without complications: Secondary | ICD-10-CM

## 2022-05-31 LAB — CUP PACEART REMOTE DEVICE CHECK
Battery Impedance: 601 Ohm
Battery Remaining Longevity: 91 mo
Battery Voltage: 2.78 V
Brady Statistic AP VP Percent: 0 %
Brady Statistic AP VS Percent: 11 %
Brady Statistic AS VP Percent: 0 %
Brady Statistic AS VS Percent: 89 %
Date Time Interrogation Session: 20231125201131
Implantable Lead Connection Status: 753985
Implantable Lead Connection Status: 753985
Implantable Lead Implant Date: 20080711
Implantable Lead Implant Date: 20150918
Implantable Lead Location: 753859
Implantable Lead Location: 753860
Implantable Lead Model: 5076
Implantable Lead Model: 5076
Implantable Pulse Generator Implant Date: 20150918
Lead Channel Impedance Value: 548 Ohm
Lead Channel Impedance Value: 586 Ohm
Lead Channel Pacing Threshold Amplitude: 0.625 V
Lead Channel Pacing Threshold Amplitude: 0.75 V
Lead Channel Pacing Threshold Pulse Width: 0.4 ms
Lead Channel Pacing Threshold Pulse Width: 0.4 ms
Lead Channel Setting Pacing Amplitude: 2 V
Lead Channel Setting Pacing Amplitude: 2.5 V
Lead Channel Setting Pacing Pulse Width: 0.4 ms
Lead Channel Setting Sensing Sensitivity: 4 mV
Zone Setting Status: 755011
Zone Setting Status: 755011

## 2022-06-03 DIAGNOSIS — H401232 Low-tension glaucoma, bilateral, moderate stage: Secondary | ICD-10-CM | POA: Diagnosis not present

## 2022-06-03 DIAGNOSIS — H35373 Puckering of macula, bilateral: Secondary | ICD-10-CM | POA: Diagnosis not present

## 2022-06-03 DIAGNOSIS — Z961 Presence of intraocular lens: Secondary | ICD-10-CM | POA: Diagnosis not present

## 2022-06-16 ENCOUNTER — Other Ambulatory Visit: Payer: Self-pay | Admitting: Internal Medicine

## 2022-07-03 ENCOUNTER — Ambulatory Visit: Payer: Medicare Other | Attending: Internal Medicine | Admitting: Internal Medicine

## 2022-07-03 ENCOUNTER — Encounter: Payer: Self-pay | Admitting: Internal Medicine

## 2022-07-03 VITALS — BP 165/82 | HR 86 | Ht 72.0 in | Wt 180.0 lb

## 2022-07-03 DIAGNOSIS — I495 Sick sinus syndrome: Secondary | ICD-10-CM | POA: Diagnosis not present

## 2022-07-03 DIAGNOSIS — I4729 Other ventricular tachycardia: Secondary | ICD-10-CM

## 2022-07-03 DIAGNOSIS — Z950 Presence of cardiac pacemaker: Secondary | ICD-10-CM | POA: Diagnosis not present

## 2022-07-03 MED ORDER — HYDROCHLOROTHIAZIDE 25 MG PO TABS: 25.0000 mg | ORAL_TABLET | Freq: Every day | ORAL | 3 refills | Status: DC

## 2022-07-03 NOTE — Progress Notes (Signed)
Patient Care Team: Hoyt Koch, MD as PCP - General (Internal Medicine) Deboraha Sprang, MD as PCP - Electrophysiology (Cardiology) Szabat, Darnelle Maffucci, [facility] (Inactive) (Pharmacist)   HPI  Michael Snyder is a 71 y.o. male seen in followup for pacemaker Medtronic implanted 2008 because of symptomatic sinus node dysfunction and profound nocturnal bradycardia not felt to be related to sleep apnea. He has had no further symptoms of lightheadedness following device implantation.  Nonsustained ventricular tachycardia was noted on his device.    In 9/15 he underwent extraction of his previously implanted RV lead which become dislodged with  insertion of a new lead and new generator carpal tunnel splint  The patient denies chest pain, shortness of breath, nocturnal dyspnea, orthopnea or peripheral edema.  There have been no palpitations, lightheadedness or syncope.    Complains of numbness in his left hand upon awakening   DATE TEST EF   20/08 Echo   55-65 %   8/21 Echo   55-60 %                Date Cr K Hgb  10/18 1.6 4.4     10/20 1.29 3.7   8/23 1.93 4.0 12.6 (3/23)     And he has a history of a retinal event that was thought to be possibly thromboembolic. He had atrial high rate episodes detected on his device and the concern was that this represented atrial fibrillation. 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     atorvastatin (LIPITOR) 20 MG tablet Take 1 tablet (20 mg total) by mouth daily. 90 tablet 3   Blood Pressure Monitoring (BLOOD PRESSURE KIT) DEVI 1 kit by Does not apply route daily. 1 each 0   brimonidine (ALPHAGAN) 0.2 % ophthalmic solution SMARTSIG:In Eye(s)     Calcium Carbonate-Vitamin D (CALTRATE 600+D) 600-400 MG-UNIT per tablet Take 1 tablet by mouth 2 (two) times daily. 180 tablet 3   Cholecalciferol (VITAMIN D) 50 MCG (2000 UT) tablet Take 2,000 Units by mouth daily.     Continuous Blood Gluc Receiver (DEXCOM G6 RECEIVER) DEVI Use as directed to check blood sugar. 1 each 0   Continuous Blood Gluc Sensor (DEXCOM G6 SENSOR) MISC Change every 10 days 9 each 3   Continuous Blood Gluc Transmit (DEXCOM G6 TRANSMITTER) MISC Change every 3 months 1 each 3   dapagliflozin propanediol (FARXIGA)  10 MG TABS tablet Take 10 mg by mouth daily.     hydrochlorothiazide (MICROZIDE) 12.5 MG capsule Take 1 capsule (12.5 mg total) by mouth daily. 90 capsule 3   Insulin Disposable Pump (OMNIPOD 5 G6 POD, GEN 5,) MISC 1 Device by Does not apply route every 3 (three) days. 2 each 3   insulin lispro (HUMALOG) 100 UNIT/ML injection Inject 50 units in pump daily 20 mL 2   Insulin Pen Needle (PEN NEEDLES) 32G X 4 MM MISC Use as Directed 3 times daily to inject insulin 100 each 5   Lancets (ONETOUCH DELICA PLUS [id number]) MISC CHECK BLOOD SUGAR TWICE  DAILY 200 each 2   latanoprost (XALATAN) 0.005 % ophthalmic solution Place 1 drop into both eyes at bedtime.     levothyroxine (SYNTHROID) 25 MCG tablet Take 1 tablet (25 mcg total) by mouth daily before breakfast. 90 tablet 3   LINZESS 145 MCG CAPS capsule TAKE 1 CAPSULE  BY MOUTH DAILY  BEFORE BREAKFAST 90 capsule 3   meloxicam (MOBIC) 15 MG tablet Take 1 tablet (15 mg total) by mouth daily. 14 tablet 0   meloxicam (MOBIC) 7.5 MG tablet Take 7.5 mg by mouth daily.     metoprolol succinate (TOPROL-XL) 25 MG 24 hr tablet TAKE 1 TABLET BY MOUTH ONCE  DAILY 90 tablet 0   Multiple Vitamin (MULTIVITAMIN WITH MINERALS) TABS tablet Take 1 tablet by mouth daily.     Omega-3 Fatty Acids (FISH OIL) 300 MG CAPS Take 1 capsule (300 mg total) by mouth daily. 90 capsule 3   ONETOUCH VERIO test strip TEST TWICE DAILY 200 strip 2   pioglitazone (ACTOS) 30 MG tablet Take 1 tablet (30 mg total) by mouth daily. 90 tablet 3   polyethylene glycol powder (GLYCOLAX/MIRALAX) 17 GM/SCOOP powder Take 17 g by mouth 2 (two) times daily as needed. 255 g 0   potassium chloride SA (KLOR-CON M) 20 MEQ tablet TAKE 1 TABLET BY MOUTH  TWICE DAILY (Patient taking differently: Take 10 mEq by mouth 2 (two) times daily.) 180 tablet 2   RYBELSUS 14 MG TABS TAKE 1 TABLET BY MOUTH DAILY 90 tablet 3   TOUJEO SOLOSTAR 300 UNIT/ML Solostar Pen INJECT 35 UNITS INTO THE SKIN  DAILY 13.5 mL 2   triamcinolone ointment (KENALOG) 0.5 % APPLY TO AFFECTED AREA(S)  TOPICALLY TWICE DAILY 90 g 0   No current facility-administered medications for this visit.    Allergies  Allergen Reactions   Clindamycin/Lincomycin Swelling    Possible facial and lip swelling   Ace Inhibitors     Facial and lip swelling possibly associated    Review of Systems negative except from HPI and PMH  Physical Exam BP (!) 140/80   Pulse 86   Ht 6' (1.829 m)   Wt 180 lb (81.6 kg)   SpO2 99%   BMI 24.41 kg/m  Well developed and well nourished in no acute distress HENT normal Neck supple with JVP-flat Clear Device pocket well healed; without hematoma or erythema.  There is no tethering  Regular rate and rhythm, no  gallop No / murmur Abd-soft with active BS No Clubbing cyanosis  edema Skin-warm and dry A & Oriented  Grossly  normal sensory and motor function  ECG sinus 86 28/08/34  Device function is normal. Programming changes   See Paceart for details     Assessment and  Plan  Sinus bradycardia  1 AV block stable  TIA  No intercurrent events or detected atrial  fibrillation   Atrial undersensing   Atrial tachycardia heart rates less than 200 detected on the device with  Pacemaker-Medtronic  T   Hypertension  Ventricular tachycardia nonsustained  Renal insufficiency grade 3    Blood pressure is inadequately controlled, will increase his hydrochlorothiazide from 12.5--25.  We will plan to check his potassium level in about 3 weeks and this will allow [place] to recheck his renal function.  Occasional atrial tachycardia but nothing sufficiently fast that should jeopardize atrial emptying velocities.  A cycle length longer than 300 ms.  Sinus bradycardia with infrequent atrial pacing.  Indeed most of his atrial pacing is with his rate algorithm on.      Stable continue current meds

## 2022-07-03 NOTE — Patient Instructions (Addendum)
Dr. Graciela Husbands recommends that you get a carpal tunnel splint  Medication Instructions:  Your physician has recommended you make the following change in your medication:  1) INCREASE HCTZ to 25 mg daily  *If you need a refill on your cardiac medications before your next appointment, please call your pharmacy*  Lab Work: THREE WEEKS: BMET (January 19th between 7:15am and 5:00pm) If you have labs (blood work) drawn today and your tests are completely normal, you will receive your results only by: MyChart Message (if you have MyChart) OR A paper copy in the mail If you have any lab test that is abnormal or we need to change your treatment, we will call you to review the results.  Follow-Up: At [facility], you and your health needs are our priority.  As part of our continuing mission to provide you with exceptional heart care, we have created designated Provider Care Teams.  These Care Teams include your primary Cardiologist (physician) and Advanced Practice Providers (APPs -  Physician Assistants and Nurse Practitioners) who all work together to provide you with the care you need, when you need it.  Your next appointment:   1 year(s)  The format for your next appointment:   In Person  Provider:   You may see Sherryl Manges, MD or one of the following Advanced Practice Providers on your designated Care Team:   Francis Dowse, [place] Casimiro Needle "Mardelle Matte" Lanna Poche, [place]     Important Information About Sugar

## 2022-07-09 NOTE — Progress Notes (Signed)
Remote pacemaker transmission.   

## 2022-07-12 ENCOUNTER — Other Ambulatory Visit: Payer: Self-pay | Admitting: Internal Medicine

## 2022-07-12 DIAGNOSIS — E876 Hypokalemia: Secondary | ICD-10-CM

## 2022-07-17 ENCOUNTER — Other Ambulatory Visit: Payer: Self-pay

## 2022-07-17 ENCOUNTER — Telehealth: Payer: Self-pay

## 2022-07-17 DIAGNOSIS — E161 Other hypoglycemia: Secondary | ICD-10-CM | POA: Diagnosis not present

## 2022-07-17 DIAGNOSIS — E119 Type 2 diabetes mellitus without complications: Secondary | ICD-10-CM

## 2022-07-17 DIAGNOSIS — R410 Disorientation, unspecified: Secondary | ICD-10-CM | POA: Diagnosis not present

## 2022-07-17 DIAGNOSIS — E1165 Type 2 diabetes mellitus with hyperglycemia: Secondary | ICD-10-CM

## 2022-07-17 DIAGNOSIS — R404 Transient alteration of awareness: Secondary | ICD-10-CM | POA: Diagnosis not present

## 2022-07-17 DIAGNOSIS — E162 Hypoglycemia, unspecified: Secondary | ICD-10-CM | POA: Diagnosis not present

## 2022-07-17 MED ORDER — DEXCOM G6 SENSOR MISC: 3 refills | Status: DC

## 2022-07-17 MED ORDER — LANTUS SOLOSTAR 100 UNIT/ML ~~LOC~~ SOPN: PEN_INJECTOR | SUBCUTANEOUS | 1 refills | Status: DC

## 2022-07-17 MED ORDER — DEXCOM G6 TRANSMITTER MISC: 3 refills | Status: DC

## 2022-07-17 MED ORDER — OMNIPOD 5 DEXG7G6 PODS GEN 5 MISC: 1.0000 | 3 refills | Status: DC

## 2022-07-17 NOTE — Telephone Encounter (Signed)
Contacted patient he understood. States he has vials and pens of humalog so doesn't need Rx. Lantus sent to pharmacy.

## 2022-07-17 NOTE — Telephone Encounter (Signed)
Patient called in earlier for refills on omnipod pods and dexcom. I sent to pharmacy and pharmacy is stating that patient cannot refill until Feb. Patient had to call EMS this morning. Blood sugar was low. Please advise on what to do

## 2022-07-17 NOTE — Telephone Encounter (Signed)
In that case, he will need long acting insulin - Lantus (7 units daily at night and increase to 9 units in 2 days if sugars are elevated). For Humalog, he can inject this with a syringe, 3-5 units per meal, the way he was taking before, through the pump.

## 2022-07-20 ENCOUNTER — Other Ambulatory Visit: Payer: Self-pay

## 2022-07-20 ENCOUNTER — Other Ambulatory Visit: Payer: Self-pay | Admitting: Endocrinology

## 2022-07-20 DIAGNOSIS — E119 Type 2 diabetes mellitus without complications: Secondary | ICD-10-CM

## 2022-07-20 DIAGNOSIS — E1165 Type 2 diabetes mellitus with hyperglycemia: Secondary | ICD-10-CM

## 2022-07-20 MED ORDER — DEXCOM G6 RECEIVER DEVI: 0 refills | Status: DC

## 2022-07-20 NOTE — Telephone Encounter (Signed)
Appointment scheduled to see me on 02/09/22

## 2022-07-24 ENCOUNTER — Ambulatory Visit: Payer: Medicare Other | Attending: Internal Medicine

## 2022-07-24 DIAGNOSIS — I4729 Other ventricular tachycardia: Secondary | ICD-10-CM | POA: Diagnosis not present

## 2022-07-24 DIAGNOSIS — I495 Sick sinus syndrome: Secondary | ICD-10-CM

## 2022-07-24 DIAGNOSIS — Z950 Presence of cardiac pacemaker: Secondary | ICD-10-CM | POA: Diagnosis not present

## 2022-07-24 LAB — BASIC METABOLIC PANEL
BUN/Creatinine Ratio: 10 (ref 10–24)
BUN: 19 mg/dL (ref 8–27)
CO2: 23 mmol/L (ref 20–29)
Calcium: 9.1 mg/dL (ref 8.6–10.2)
Chloride: 101 mmol/L (ref 96–106)
Creatinine, Ser: 1.87 mg/dL — ABNORMAL HIGH (ref 0.76–1.27)
Glucose: 351 mg/dL — ABNORMAL HIGH (ref 70–99)
Potassium: 4.4 mmol/L (ref 3.5–5.2)
Sodium: 138 mmol/L (ref 134–144)
eGFR: 38 mL/min/{1.73_m2} — ABNORMAL LOW (ref >59)

## 2022-07-29 ENCOUNTER — Other Ambulatory Visit: Payer: Self-pay | Admitting: Endocrinology

## 2022-07-29 ENCOUNTER — Encounter: Payer: Medicare Other | Attending: Endocrinology | Admitting: Nutrition

## 2022-07-29 ENCOUNTER — Telehealth: Payer: Self-pay | Admitting: Nutrition

## 2022-07-29 DIAGNOSIS — E118 Type 2 diabetes mellitus with unspecified complications: Secondary | ICD-10-CM | POA: Diagnosis not present

## 2022-07-29 NOTE — Progress Notes (Signed)
Patient is here today because he did not get his Omni Pods in December, and his dexcoms did not last all month, so he was using his meter.  He reports that his blood sugar dropped low and he passed out.  He reports that they gave him an IV of glucose. We discussed symptoms and treatments of lows and he was shown the injections and nasal spray that his girlfriend can use if he passes out.  He said she would prefer the nasal spray.  Note to Alysia to order this Patient thought he was getting his pods from [facility], but was getting them through [facility].  We called them and because he did not reorder them, he did not get any.  He was given the phone number to call them when he gets low.  They will send out a new script of a three month supply to him by Feb. 4.  He purchased 5 pods from [employer] and will use them starting tomorrow.  Written instructions given to stop the [employer].  He agreed to do this.   He had no final questions.

## 2022-07-29 NOTE — Telephone Encounter (Signed)
Patient is here in the office today because he did not get his OmniPods in December, and ran short of his Dexcom G6s due to them falling off.  He is currently taking [place] and Humalog via pens. His blood sugar dropped low and girlfriend had to call EMS. He was shown how to use baqsimi and requests that a script be called in to [place] for this. [name]

## 2022-07-29 NOTE — Patient Instructions (Signed)
Call [facility] when you start your last box of pods

## 2022-07-30 NOTE — Telephone Encounter (Signed)
Ok to send this medication? I don't see it on patient chart.

## 2022-08-09 ENCOUNTER — Other Ambulatory Visit: Payer: Self-pay | Admitting: Internal Medicine

## 2022-08-11 DIAGNOSIS — E1165 Type 2 diabetes mellitus with hyperglycemia: Secondary | ICD-10-CM | POA: Diagnosis not present

## 2022-08-24 ENCOUNTER — Ambulatory Visit (INDEPENDENT_AMBULATORY_CARE_PROVIDER_SITE_OTHER): Payer: Medicare Other

## 2022-08-24 DIAGNOSIS — I4729 Other ventricular tachycardia: Secondary | ICD-10-CM | POA: Diagnosis not present

## 2022-08-25 DIAGNOSIS — H52223 Regular astigmatism, bilateral: Secondary | ICD-10-CM | POA: Diagnosis not present

## 2022-08-25 DIAGNOSIS — Z961 Presence of intraocular lens: Secondary | ICD-10-CM | POA: Diagnosis not present

## 2022-08-25 DIAGNOSIS — H35373 Puckering of macula, bilateral: Secondary | ICD-10-CM | POA: Diagnosis not present

## 2022-08-25 DIAGNOSIS — E119 Type 2 diabetes mellitus without complications: Secondary | ICD-10-CM | POA: Diagnosis not present

## 2022-08-25 DIAGNOSIS — H5203 Hypermetropia, bilateral: Secondary | ICD-10-CM | POA: Diagnosis not present

## 2022-08-25 DIAGNOSIS — H524 Presbyopia: Secondary | ICD-10-CM | POA: Diagnosis not present

## 2022-08-25 DIAGNOSIS — H26491 Other secondary cataract, right eye: Secondary | ICD-10-CM | POA: Diagnosis not present

## 2022-08-25 DIAGNOSIS — H401232 Low-tension glaucoma, bilateral, moderate stage: Secondary | ICD-10-CM | POA: Diagnosis not present

## 2022-08-26 LAB — CUP PACEART REMOTE DEVICE CHECK
Battery Impedance: 703 Ohm
Battery Remaining Longevity: 85 mo
Battery Voltage: 2.78 V
Brady Statistic AP VP Percent: 0 %
Brady Statistic AP VS Percent: 11 %
Brady Statistic AS VP Percent: 0 %
Brady Statistic AS VS Percent: 88 %
Date Time Interrogation Session: 20240221124850
Implantable Lead Connection Status: 753985
Implantable Lead Connection Status: 753985
Implantable Lead Implant Date: 20080711
Implantable Lead Implant Date: 20150918
Implantable Lead Location: 753859
Implantable Lead Location: 753860
Implantable Lead Model: 5076
Implantable Lead Model: 5076
Implantable Pulse Generator Implant Date: 20150918
Lead Channel Impedance Value: 509 Ohm
Lead Channel Impedance Value: 523 Ohm
Lead Channel Pacing Threshold Amplitude: 0.625 V
Lead Channel Pacing Threshold Amplitude: 0.625 V
Lead Channel Pacing Threshold Pulse Width: 0.4 ms
Lead Channel Pacing Threshold Pulse Width: 0.4 ms
Lead Channel Setting Pacing Amplitude: 2 V
Lead Channel Setting Pacing Amplitude: 2.5 V
Lead Channel Setting Pacing Pulse Width: 0.4 ms
Lead Channel Setting Sensing Sensitivity: 2 mV
Zone Setting Status: 755011
Zone Setting Status: 755011

## 2022-08-28 DIAGNOSIS — N1832 Chronic kidney disease, stage 3b: Secondary | ICD-10-CM | POA: Diagnosis not present

## 2022-08-31 ENCOUNTER — Other Ambulatory Visit (INDEPENDENT_AMBULATORY_CARE_PROVIDER_SITE_OTHER): Payer: Medicare Other

## 2022-08-31 DIAGNOSIS — E1165 Type 2 diabetes mellitus with hyperglycemia: Secondary | ICD-10-CM | POA: Diagnosis not present

## 2022-08-31 DIAGNOSIS — I129 Hypertensive chronic kidney disease with stage 1 through stage 4 chronic kidney disease, or unspecified chronic kidney disease: Secondary | ICD-10-CM | POA: Diagnosis not present

## 2022-08-31 DIAGNOSIS — I495 Sick sinus syndrome: Secondary | ICD-10-CM | POA: Diagnosis not present

## 2022-08-31 DIAGNOSIS — E876 Hypokalemia: Secondary | ICD-10-CM | POA: Diagnosis not present

## 2022-08-31 DIAGNOSIS — N1832 Chronic kidney disease, stage 3b: Secondary | ICD-10-CM | POA: Diagnosis not present

## 2022-08-31 DIAGNOSIS — N281 Cyst of kidney, acquired: Secondary | ICD-10-CM | POA: Diagnosis not present

## 2022-08-31 DIAGNOSIS — N200 Calculus of kidney: Secondary | ICD-10-CM | POA: Diagnosis not present

## 2022-08-31 DIAGNOSIS — E1122 Type 2 diabetes mellitus with diabetic chronic kidney disease: Secondary | ICD-10-CM | POA: Diagnosis not present

## 2022-08-31 DIAGNOSIS — Z794 Long term (current) use of insulin: Secondary | ICD-10-CM

## 2022-08-31 DIAGNOSIS — N2581 Secondary hyperparathyroidism of renal origin: Secondary | ICD-10-CM | POA: Diagnosis not present

## 2022-08-31 LAB — GLUCOSE, RANDOM: Glucose, Bld: 183 mg/dL — ABNORMAL HIGH (ref 70–99)

## 2022-09-01 LAB — FRUCTOSAMINE: Fructosamine: 520 umol/L — ABNORMAL HIGH (ref 0–285)

## 2022-09-02 ENCOUNTER — Encounter: Payer: Self-pay | Admitting: Endocrinology

## 2022-09-02 ENCOUNTER — Encounter: Payer: Medicare Other | Attending: Endocrinology | Admitting: Nutrition

## 2022-09-02 ENCOUNTER — Ambulatory Visit (INDEPENDENT_AMBULATORY_CARE_PROVIDER_SITE_OTHER): Payer: Medicare Other | Admitting: Endocrinology

## 2022-09-02 VITALS — BP 146/82 | HR 71 | Ht 72.0 in | Wt 175.4 lb

## 2022-09-02 DIAGNOSIS — Z794 Long term (current) use of insulin: Secondary | ICD-10-CM | POA: Diagnosis not present

## 2022-09-02 DIAGNOSIS — E118 Type 2 diabetes mellitus with unspecified complications: Secondary | ICD-10-CM | POA: Insufficient documentation

## 2022-09-02 DIAGNOSIS — E1165 Type 2 diabetes mellitus with hyperglycemia: Secondary | ICD-10-CM | POA: Diagnosis not present

## 2022-09-02 LAB — POCT GLYCOSYLATED HEMOGLOBIN (HGB A1C): Hemoglobin A1C: 9.5 % — AB (ref 4.0–5.6)

## 2022-09-02 NOTE — Progress Notes (Signed)
Patient ID: Michael Snyder, male   DOB: Aug 25, 1950, 72 y.o.   MRN: [id number]           Reason for Appointment: Type II Diabetes follow-up   History of Present Illness    Diagnosis date: 2008  Previous history:  Oral hypoglycemic drugs previously used are: Glipizide, Actos, metformin Insulin was started in 2020 with Tresiba, subsequently Lantus and Humalog as well as 75/25  A1c range in the last few years is: 8.3-12.8  Recent history:     Lantus 5 units in the evening and Humalog 5-7 units before meals  OMNIPOD insulin pump settings previously: Basal rate = 0.3 units an hour all 24 hours Carb ratio 1:1 Sensitivity 1: 50 Target 120 and correction threshold 120  Non-insulin hypoglycemic drugs: Farxiga, Rybelsus and Actos        Side effects from medications: None  Current self management, blood sugar patterns and problems identified:  A1c is 9.5 Not followed up since 8/23 Unclear what his blood sugar patterns have been but he has not used his pump or sensor for at least a month Currently 7 units of Lantus being used only in the evening With this his blood sugars are generally fairly good fasting but he has recently mostly high readings before dinner with variability Unclear what his postprandial readings are He is now able to get his supplies for the OmniPod 5 pump and needs training to do this He is not very much aware of how to manage the pump and is still not clear how to do boluses Previously was told to reduce his breakfast bolus and take larger doses at lunch and dinner but no follow-up is available Not clear if he is benefiting from non insulin hypoglycemic drugs  Diet management: Largest meal is at dinnertime   PRE-MEAL Fasting Lunch Dinner Bedtime Overall  Glucose range: 83-163  105-324    Mean/median: 122  202  161   POST-MEAL PC Breakfast PC Lunch PC Dinner  Glucose range:   ?  Mean/median:      Previous data:      CGM use % of time   2-week average/GV  160/46  Time in range    66%  % Time Above 180 17 6  % Time above 250 13  % Time Below 70 3+1     Interpretation of his CGM for the Dexcom sensor shows the following OVERNIGHT blood sugars are mostly well controlled within the target range but frequently higher for the first couple of hours after midnight and also at least twice normal right after midnight Premeal readings are fairly well controlled except occasionally low before dinnertime POSTPRANDIAL readings are as follows After breakfast he has a few sporadic significantly high readings but usually blood sugars are even with Premeal readings and at least 2 or 3 times are low After lunch blood sugars are highly variable but frequently appear to be very high around 3 PM Also after dinner blood sugars are mostly running significantly high above the target range             Dietician visit: Most recent:  10/09/21    Weight control:  Wt Readings from Last 3 Encounters:  09/02/22 175 lb 6.4 oz (79.6 kg)  07/03/22 180 lb (81.6 kg)  03/12/22 179 lb (81.2 kg)            Diabetes labs:  Lab Results  Component Value Date   HGBA1C 9.5 (A) 09/02/2022   HGBA1C 8.1 (H)  02/27/2022   HGBA1C 8.2 (A) 12/17/2021   Lab Results  Component Value Date   MICROALBUR 4.8 (H) 02/27/2022   LDLCALC 50 02/27/2022   CREATININE 1.87 (H) 07/24/2022     Allergies as of 09/02/2022       Reactions   Clindamycin/lincomycin Swelling   Possible facial and lip swelling   Ace Inhibitors    Facial and lip swelling possibly associated        Medication List        Accurate as of September 02, 2022  3:34 PM. If you have any questions, ask your nurse or doctor.          aspirin EC 81 MG tablet Take 81 mg daily by mouth.   atorvastatin 20 MG tablet Commonly known as: LIPITOR Take 1 tablet (20 mg total) by mouth daily. Annual appt due in Sept must see provider for future refills   Blood Pressure Kit Devi 1 kit by Does not apply route  daily.   brimonidine 0.2 % ophthalmic solution Commonly known as: ALPHAGAN SMARTSIG:In Eye(s)   Calcium Carbonate-Vitamin D 600-400 MG-UNIT tablet Commonly known as: Caltrate 600+D Take 1 tablet by mouth 2 (two) times daily.   dapagliflozin propanediol 10 MG Tabs tablet Commonly known as: FARXIGA Take 10 mg by mouth daily.   Dexcom G6 Receiver Devi Use as directed to check blood sugar.   Dexcom G6 Sensor Misc Change every 10 days   Dexcom G6 Transmitter Misc Change every 3 months   Fish Oil 300 MG Caps Take 1 capsule (300 mg total) by mouth daily.   hydrochlorothiazide 25 MG tablet Commonly known as: HYDRODIURIL Take 1 tablet (25 mg total) by mouth daily.   insulin lispro 100 UNIT/ML injection Commonly known as: HumaLOG INJECT SUBCUTANEOUSLY 50 UNITS  DAILY VIA INSULIN PUMP   Lantus SoloStar 100 UNIT/ML Solostar Pen Generic drug: insulin glargine Inject 7 units daily What changed: Another medication with the same name was removed. Continue taking this medication, and follow the directions you see here. Changed by: Elayne Snare, MD   latanoprost 0.005 % ophthalmic solution Commonly known as: XALATAN Place 1 drop into both eyes at bedtime.   levothyroxine 25 MCG tablet Commonly known as: SYNTHROID Take 1 tablet (25 mcg total) by mouth daily before breakfast.   Linzess 145 MCG Caps capsule Generic drug: linaclotide TAKE 1 CAPSULE BY MOUTH DAILY  BEFORE BREAKFAST   meloxicam 7.5 MG tablet Commonly known as: MOBIC Take 7.5 mg by mouth daily.   meloxicam 15 MG tablet Commonly known as: MOBIC Take 1 tablet (15 mg total) by mouth daily.   metoprolol succinate 25 MG 24 hr tablet Commonly known as: TOPROL-XL TAKE 1 TABLET BY MOUTH ONCE  DAILY   multivitamin with minerals Tabs tablet Take 1 tablet by mouth daily.   Omnipod 5 G6 Pods (Gen 5) Misc 1 Device by Does not apply route every 3 (three) days.   OneTouch Delica Plus 0000000 Misc CHECK BLOOD SUGAR  TWICE  DAILY   OneTouch Verio test strip Generic drug: glucose blood TEST TWICE DAILY   Pen Needles 32G X 4 MM Misc Use as Directed 3 times daily to inject insulin   pioglitazone 30 MG tablet Commonly known as: ACTOS Take 1 tablet (30 mg total) by mouth daily.   polyethylene glycol powder 17 GM/SCOOP powder Commonly known as: GLYCOLAX/MIRALAX Take 17 g by mouth 2 (two) times daily as needed.   potassium chloride SA 20 MEQ tablet Commonly known as:  KLOR-CON M TAKE 1 TABLET BY MOUTH  TWICE DAILY What changed: how much to take   Rybelsus 14 MG Tabs Generic drug: Semaglutide TAKE 1 TABLET BY MOUTH DAILY   triamcinolone ointment 0.5 % Commonly known as: KENALOG APPLY TO AFFECTED AREA(S)  TOPICALLY TWICE DAILY   Vitamin D 50 MCG (2000 UT) tablet Take 2,000 Units by mouth daily.        Allergies:  Allergies  Allergen Reactions   Clindamycin/Lincomycin Swelling    Possible facial and lip swelling   Ace Inhibitors     Facial and lip swelling possibly associated    Past Medical History:  Diagnosis Date   Alcohol abuse    Arthritis    Diabetes mellitus, type 2 ([place])    Head injury, closed, without LOC 1980s   History of alcohol abuse    Hyperlipidemia    Hypertension    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   Pacemaker    Medtronic Adapta ADDR01   Seizure disorder ([place])    Seizures ([place])    last time in 1990's    Past Surgical History:  Procedure Laterality Date   LACERATION REPAIR  1980   scalp   PACEMAKER LEAD REMOVAL N/A 03/23/2014   Procedure: PACEMAKER LEAD REMOVAL;  Surgeon: Evans Lance, MD;  Location: [place];  Service: Cardiovascular;  Laterality: N/A;   PTDVP     7/08 medtronic DDD    Family History  Problem Relation Age of Onset   Hypertension Mother    COPD Mother    Hypertension Brother    Cancer Brother        leukemia   Hypertension Brother    Diabetes Neg Hx     Social History:  reports that he has never smoked. He has never  used smokeless tobacco. He reports that he does not drink alcohol and does not use drugs.  Review of Systems:  Last diabetic eye exam date 2/23  Last foot exam date: 10/22  Symptoms of neuropathy: None  Hypertension:   Treatment includes HCTZ Also followed by nephrologist  BP Readings from Last 3 Encounters:  09/02/22 (!) 146/82  07/03/22 (!) 165/82  03/12/22 122/78    Lipids:     Lab Results  Component Value Date   CHOL 113 02/27/2022   CHOL 119 03/13/2021   CHOL 187 04/20/2019   Lab Results  Component Value Date   HDL 49.40 02/27/2022   HDL 41.30 03/13/2021   HDL 46.20 04/20/2019   Lab Results  Component Value Date   LDLCALC 50 02/27/2022   LDLCALC 57 03/13/2021   [place] 85 05/02/2018   Lab Results  Component Value Date   TRIG 68.0 02/27/2022   TRIG 107.0 03/13/2021   TRIG 209.0 (H) 04/20/2019   Lab Results  Component Value Date   CHOLHDL 2 02/27/2022   CHOLHDL 3 03/13/2021   CHOLHDL 4 04/20/2019   Lab Results  Component Value Date   LDLDIRECT 102.0 04/20/2019   Currently on low-dose levothyroxine 25 mcg from his PCP and TSH has been normal  Lab Results  Component Value Date   TSH 2.65 02/27/2022   TSH 4.65 (H) 08/02/2020   TSH 2.36 06/10/2017      Examination:   BP (!) 146/82 (BP Location: Left Arm, Patient Position: Sitting, Cuff Size: Normal)   Pulse 71   Ht 6' (1.829 m)   Wt 175 lb 6.4 oz (79.6 kg)   SpO2 99%  BMI 23.79 kg/m   Body mass index is 23.79 kg/m.    ASSESSMENT/ PLAN:    Diabetes type 2 on insulin:   Current regimen: Insulin pump and oral agents including Rybelsus, Farxiga and Actos  A1c is 9.5  He is currently not using OmniPod insulin pump and blood sugars are higher The last 2 weeks data indicate that he is having fairly good readings fasting with evening dose of Lantus but this is not lasting 24 hours Also unclear whether he is getting enough insulin to cover his meals during the day with lack of  monitoring   Recommendations:  Since he will be starting the OmniPod pump today will discontinue Lantus and mealtime injections He will for now continue oral medications but may consider stopping them on the next visit Bolus doses at lunch and dinner to be 7 and 10 units for now   Patient Instructions  Breakfast bolus 3-4 units, 7 for lunch and 10 for dinner  Always bolus before meals  Lantus 5 units twice daily till starting pump  Elayne Snare 09/02/2022, 3:34 PM

## 2022-09-02 NOTE — Patient Instructions (Signed)
Put pump in automated mode after temp basal rate ends at [place].   Call OmniPod if questions about pump use.

## 2022-09-02 NOTE — Progress Notes (Signed)
Patient was restarted on his insulin pump.  He filled a pod and attached it to his left abdomen without difficulty.  Pump was put in a 100% reduction mode until 7PM tonight.  He was shown how to put the pump into the automated mode after the temp rate has finished.  He had no final questions.  We reviewed how to bolus and do correction doses, and he reported good understanding of this.

## 2022-09-02 NOTE — Patient Instructions (Signed)
Breakfast bolus 3-4 units, 7 for lunch and 10 for dinner  Always bolus before meals  Lantus 5 units twice daily till starting pump

## 2022-09-09 ENCOUNTER — Encounter: Payer: Medicare Other | Attending: Endocrinology | Admitting: Nutrition

## 2022-09-09 DIAGNOSIS — E118 Type 2 diabetes mellitus with unspecified complications: Secondary | ICD-10-CM | POA: Insufficient documentation

## 2022-09-09 NOTE — Progress Notes (Signed)
Mr. Rauschenberger Dexcom app had an advertisement on the app, that would not let him start a new sensor.  A few buttons were pressed and the add finally disappeared.  He inserted a sensor without difficulty, and had no questions.

## 2022-09-10 DIAGNOSIS — E1165 Type 2 diabetes mellitus with hyperglycemia: Secondary | ICD-10-CM | POA: Diagnosis not present

## 2022-09-24 DIAGNOSIS — H401232 Low-tension glaucoma, bilateral, moderate stage: Secondary | ICD-10-CM | POA: Diagnosis not present

## 2022-09-24 DIAGNOSIS — H47012 Ischemic optic neuropathy, left eye: Secondary | ICD-10-CM | POA: Diagnosis not present

## 2022-10-01 ENCOUNTER — Telehealth: Payer: Self-pay | Admitting: Dietician

## 2022-10-01 NOTE — Telephone Encounter (Signed)
Returned patient call.  He would like a prescription for Glucagon.  Request sent to [facility].  Antonieta Iba, RD, LDN, CDCES

## 2022-10-05 NOTE — Telephone Encounter (Signed)
Patient advised.

## 2022-10-06 ENCOUNTER — Ambulatory Visit (INDEPENDENT_AMBULATORY_CARE_PROVIDER_SITE_OTHER): Payer: Medicare Other | Admitting: Endocrinology

## 2022-10-06 ENCOUNTER — Encounter: Payer: Self-pay | Admitting: Endocrinology

## 2022-10-06 VITALS — BP 110/62 | HR 77 | Ht 72.0 in | Wt 167.0 lb

## 2022-10-06 DIAGNOSIS — Z794 Long term (current) use of insulin: Secondary | ICD-10-CM | POA: Diagnosis not present

## 2022-10-06 DIAGNOSIS — E1165 Type 2 diabetes mellitus with hyperglycemia: Secondary | ICD-10-CM | POA: Diagnosis not present

## 2022-10-06 MED ORDER — GVOKE PFS 1 MG/0.2ML ~~LOC~~ SOSY: PREFILLED_SYRINGE | SUBCUTANEOUS | 1 refills | Status: DC

## 2022-10-06 NOTE — Progress Notes (Unsigned)
Patient ID: Michael Snyder, male   DOB: 09-21-1950, 72 y.o.   MRN: [id number]           Reason for Appointment: Type II Diabetes follow-up   History of Present Illness    Diagnosis date: 2008  Previous history:  Oral hypoglycemic drugs previously used are: Glipizide, Actos, metformin Insulin was started in 2020 with Tresiba, subsequently Lantus and Humalog as well as 75/25  Previous insulin regimen: Lantus 5 units in the evening and Humalog 5-7 units before meals A1c range in the last few years is: 8.3-12.8  Recent history:      OMNIPOD insulin pump settings currently: Basal rate = 0.3 units an hour all 24 hours Carb ratio 1:1 Sensitivity 1: 50 Target 120 and correction threshold 120  Non-insulin hypoglycemic drugs: Farxiga, Rybelsus and Actos        Side effects from medications: None  Current self management, blood sugar patterns and problems identified:  A1c is last 9.5 He has started back on his insulin pump after help from the diabetes educator Although he has been able to use the sensor fairly consistently he has not been in the automated mode all the time Currently only 56% in the automated mode and he forgets to turn on the setting He was not aware how to look for and change the manual to the automated setting  Also he is assuming that he is getting insulin to cover his meals and is only rarely doing any boluses Also his boluses appear to be inadequate Hypoglycemia as occurring sporadically mostly midday and likely related to late boluses or inadequate protein Most of the time is entering in 4-5 units boluses and usually after blood sugar has gone up Followed up since 8/23 Without adequate mealtime control is overall time in range is only 44% Not clear if he is benefiting from non insulin hypoglycemic drugs including Rybelsus  Diet management: Largest meal is at dinnertime   Analysis of the CGM from his OmniPod download as follows  Overnight blood sugars are  the highest around midnight and lowest around 6 AM and mostly hyperglycemic over 200 after about 10 PM Blood sugars may be low normal between 5-7 AM and low on 1 occasion Postprandial readings are generally mostly rising significantly especially in the afternoons and some late evening to as much is 350+ On an average blood sugars are still still higher Premeal in the midday and dinnertime Overall highest blood sugars are after about 10 PM Hypoglycemia has been occurring only sporadically around midday  CGM use % of time   2-week average/GV 214  Time in range      44%  % Time Above 180 22  % Time above 250 32  % Time Below 70 1     Previous data:  PRE-MEAL Fasting Lunch Dinner Bedtime Overall  Glucose range: 83-163  105-324    Mean/median: 122  202  161   POST-MEAL PC Breakfast PC Lunch PC Dinner  Glucose range:   ?  Mean/median:                 Dietician visit: Most recent:  10/09/21    Weight control:  Wt Readings from Last 3 Encounters:  10/06/22 167 lb (75.8 kg)  09/02/22 175 lb 6.4 oz (79.6 kg)  07/03/22 180 lb (81.6 kg)            Diabetes labs:  Lab Results  Component Value Date   HGBA1C 9.5 (A) 09/02/2022  HGBA1C 8.1 (H) 02/27/2022   HGBA1C 8.2 (A) 12/17/2021   Lab Results  Component Value Date   MICROALBUR 4.8 (H) 02/27/2022   LDLCALC 50 02/27/2022   CREATININE 1.87 (H) 07/24/2022     Allergies as of 10/06/2022       Reactions   Clindamycin/lincomycin Swelling   Possible facial and lip swelling   Ace Inhibitors    Facial and lip swelling possibly associated        Medication List        Accurate as of October 06, 2022 11:59 PM. If you have any questions, ask your nurse or doctor.          STOP taking these medications    Lantus SoloStar 100 UNIT/ML Solostar Pen Generic drug: insulin glargine Stopped by: Elayne Snare, MD       TAKE these medications    aspirin EC 81 MG tablet Take 81 mg daily by mouth.   atorvastatin 20 MG  tablet Commonly known as: LIPITOR Take 1 tablet (20 mg total) by mouth daily. Annual appt due in Sept must see provider for future refills   Blood Pressure Kit Devi 1 kit by Does not apply route daily.   brimonidine 0.2 % ophthalmic solution Commonly known as: ALPHAGAN SMARTSIG:In Eye(s)   Calcium Carbonate-Vitamin D 600-400 MG-UNIT tablet Commonly known as: Caltrate 600+D Take 1 tablet by mouth 2 (two) times daily.   dapagliflozin propanediol 10 MG Tabs tablet Commonly known as: FARXIGA Take 10 mg by mouth daily.   Dexcom G6 Receiver Devi Use as directed to check blood sugar.   Dexcom G6 Sensor Misc Change every 10 days   Dexcom G6 Transmitter Misc Change every 3 months   Fish Oil 300 MG Caps Take 1 capsule (300 mg total) by mouth daily.   Gvoke PFS 1 MG/0.2ML Sosy Generic drug: Glucagon Inject contents of syringe for severe low sugars Started by: Elayne Snare, MD   hydrochlorothiazide 25 MG tablet Commonly known as: HYDRODIURIL Take 1 tablet (25 mg total) by mouth daily.   insulin lispro 100 UNIT/ML injection Commonly known as: HumaLOG INJECT SUBCUTANEOUSLY 50 UNITS  DAILY VIA INSULIN PUMP   latanoprost 0.005 % ophthalmic solution Commonly known as: XALATAN Place 1 drop into both eyes at bedtime.   levothyroxine 25 MCG tablet Commonly known as: SYNTHROID Take 1 tablet (25 mcg total) by mouth daily before breakfast.   Linzess 145 MCG Caps capsule Generic drug: linaclotide TAKE 1 CAPSULE BY MOUTH DAILY  BEFORE BREAKFAST   meloxicam 7.5 MG tablet Commonly known as: MOBIC Take 7.5 mg by mouth daily.   meloxicam 15 MG tablet Commonly known as: MOBIC Take 1 tablet (15 mg total) by mouth daily.   metoprolol succinate 25 MG 24 hr tablet Commonly known as: TOPROL-XL TAKE 1 TABLET BY MOUTH ONCE  DAILY   multivitamin with minerals Tabs tablet Take 1 tablet by mouth daily.   Omnipod 5 G6 Pods (Gen 5) Misc 1 Device by Does not apply route every 3 (three)  days.   OneTouch Delica Plus 0000000 Misc CHECK BLOOD SUGAR TWICE  DAILY   OneTouch Verio test strip Generic drug: glucose blood TEST TWICE DAILY   Pen Needles 32G X 4 MM Misc Use as Directed 3 times daily to inject insulin   pioglitazone 30 MG tablet Commonly known as: ACTOS Take 1 tablet (30 mg total) by mouth daily.   polyethylene glycol powder 17 GM/SCOOP powder Commonly known as: GLYCOLAX/MIRALAX Take 17 g by mouth  2 (two) times daily as needed.   potassium chloride SA 20 MEQ tablet Commonly known as: KLOR-CON M TAKE 1 TABLET BY MOUTH  TWICE DAILY What changed: how much to take   Rybelsus 14 MG Tabs Generic drug: Semaglutide TAKE 1 TABLET BY MOUTH DAILY   triamcinolone ointment 0.5 % Commonly known as: KENALOG APPLY TO AFFECTED AREA(S)  TOPICALLY TWICE DAILY   Vitamin D 50 MCG (2000 UT) tablet Take 2,000 Units by mouth daily.        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   Pacemaker    Medtronic Adapta ADDR01   Seizure disorder    Seizures    last time in 1990's    Past Surgical History:  Procedure Laterality Date   LACERATION REPAIR  1980   scalp   PACEMAKER LEAD REMOVAL N/A 03/23/2014   Procedure: PACEMAKER LEAD REMOVAL;  Surgeon: Evans Lance, MD;  Location: [place];  Service: Cardiovascular;  Laterality: N/A;   PTDVP     7/08 medtronic DDD    Family History  Problem Relation Age of Onset   Hypertension Mother    COPD Mother    Hypertension Brother    Cancer Brother        leukemia   Hypertension Brother    Diabetes Neg Hx     Social History:   reports that he has never smoked. He has never used smokeless tobacco. He reports that he does not drink alcohol and does not use drugs.  Review of Systems:  Last diabetic eye exam date 2/23  Last foot exam date: 10/22  Symptoms of neuropathy: None  Hypertension:   Treatment includes HCTZ Also followed by nephrologist  BP Readings from Last 3 Encounters:  10/06/22 110/62  09/02/22 (!) 146/82  07/03/22 (!) 165/82   Lab Results  Component Value Date   CREATININE 1.87 (H) 07/24/2022   CREATININE 1.93 (H) 02/27/2022   CREATININE 1.74 (H) 09/11/2021    Lipids: He is getting Lipitor 20 mg daily from PCP    Lab Results  Component Value Date   CHOL 113 02/27/2022   CHOL 119 03/13/2021   CHOL 187 04/20/2019   Lab Results  Component Value Date   HDL 49.40 02/27/2022   HDL 41.30 03/13/2021   HDL 46.20 04/20/2019   Lab Results  Component Value Date   LDLCALC 50 02/27/2022   LDLCALC 57 03/13/2021   [place] 85 05/02/2018   Lab Results  Component Value Date   TRIG 68.0 02/27/2022   TRIG 107.0 03/13/2021   TRIG 209.0 (H) 04/20/2019   Lab Results  Component Value Date   CHOLHDL 2 02/27/2022   CHOLHDL 3 03/13/2021   CHOLHDL 4 04/20/2019   Lab Results  Component Value Date   LDLDIRECT 102.0 04/20/2019   Currently on low-dose levothyroxine 25 mcg from his PCP and TSH has been normal  Lab Results  Component Value Date   TSH 2.65 02/27/2022   TSH 4.65 (H) 08/02/2020   TSH  2.36 06/10/2017      Examination:   BP 110/62   Pulse 77   Ht 6' (1.829 m)   Wt 167 lb (75.8 kg)   SpO2 99%   BMI 22.65 kg/m   Body mass index is 22.65 kg/m.    ASSESSMENT/ PLAN:    Diabetes type 2 on insulin:   Current regimen: Insulin pump and oral agents including Rybelsus, Farxiga and Actos  A1c is 9.5 last  He is currently back to using OmniPod insulin pump but blood sugars are higher after meals from late or inadequate and missed boluses He is still not understand how to  control postprandial readings with Premeal boluses and the timing of the boluses Also likely is getting inadequate boluses with 4 to 5 units especially at dinnertime when his blood sugars are the highest overall after about 10 PM Also some of his late boluses may cause some hypoglycemia   Recommendations:  He will need to start bolusing with every meal and bolus before starting to eat Discussed also how to do a correction bolus if blood sugars are high Also reminded him to look for the indication for automated mode and if he gets out of the automated mode to put it back in right away May consider raising his target early morning up to 130 to avoid low sugars but currently this is happening only rarely  He will for now continue oral medications except for stopping at least Rybelsus which is likely to be unhelpful for his postprandial readings  Bolus doses at larger meals especially dinner will be at least 7 to 8 units and generally it can take 5 or 6 for breakfast and lunch for smaller meals Also watch any high carbohydrate snacks  Total visit time including counseling = 30 minutes  Patient Instructions  Keep an eye on the right top corner to make sure you are in the automated mode all the time if it goes on manual mode and click on the switch mode and get back to automated mode Make sure you take a BOLUS 5 to 10-minute before you start eating every meal unless it is only a very low carbohydrate meals such as a salad.  For your main meal I think you need to take at least 7 to 8 units and for other meals 5 to 6 units If your blood sugar is unexpectedly high please take a correction bolus as shown today Stop taking Rybelsus when finished   Elayne Snare 10/07/2022, 2:27 PM

## 2022-10-06 NOTE — Patient Instructions (Addendum)
Keep an eye on the right top corner to make sure you are in the automated mode all the time if it goes on manual mode and click on the switch mode and get back to automated mode Make sure you take a BOLUS 5 to 10-minute before you start eating every meal unless it is only a very low carbohydrate meals such as a salad.  For your main meal I think you need to take at least 7 to 8 units and for other meals 5 to 6 units If your blood sugar is unexpectedly high please take a correction bolus as shown today Stop taking Rybelsus when finished

## 2022-10-07 NOTE — Progress Notes (Signed)
Remote pacemaker transmission.   

## 2022-10-08 DIAGNOSIS — H26491 Other secondary cataract, right eye: Secondary | ICD-10-CM | POA: Diagnosis not present

## 2022-10-10 DIAGNOSIS — E1165 Type 2 diabetes mellitus with hyperglycemia: Secondary | ICD-10-CM | POA: Diagnosis not present

## 2022-10-14 ENCOUNTER — Other Ambulatory Visit: Payer: Self-pay

## 2022-10-14 DIAGNOSIS — E119 Type 2 diabetes mellitus without complications: Secondary | ICD-10-CM

## 2022-10-14 DIAGNOSIS — E118 Type 2 diabetes mellitus with unspecified complications: Secondary | ICD-10-CM

## 2022-10-14 DIAGNOSIS — E1165 Type 2 diabetes mellitus with hyperglycemia: Secondary | ICD-10-CM

## 2022-10-14 MED ORDER — DEXCOM G6 TRANSMITTER MISC
3 refills | Status: DC
Start: 2022-10-14 — End: 2023-03-01

## 2022-10-14 MED ORDER — INSULIN LISPRO 100 UNIT/ML IJ SOLN
INTRAMUSCULAR | 2 refills | Status: DC
Start: 2022-10-14 — End: 2022-10-16

## 2022-10-14 MED ORDER — DEXCOM G6 SENSOR MISC
3 refills | Status: DC
Start: 2022-10-14 — End: 2022-12-16

## 2022-10-14 MED ORDER — PIOGLITAZONE HCL 30 MG PO TABS
30.0000 mg | ORAL_TABLET | Freq: Every day | ORAL | 3 refills | Status: DC
Start: 2022-10-14 — End: 2022-10-16

## 2022-10-15 ENCOUNTER — Encounter: Payer: Self-pay | Admitting: Endocrinology

## 2022-10-16 ENCOUNTER — Other Ambulatory Visit: Payer: Self-pay

## 2022-10-16 DIAGNOSIS — E119 Type 2 diabetes mellitus without complications: Secondary | ICD-10-CM

## 2022-10-16 DIAGNOSIS — E118 Type 2 diabetes mellitus with unspecified complications: Secondary | ICD-10-CM

## 2022-10-16 DIAGNOSIS — E1165 Type 2 diabetes mellitus with hyperglycemia: Secondary | ICD-10-CM

## 2022-10-16 MED ORDER — GVOKE PFS 1 MG/0.2ML ~~LOC~~ SOSY: PREFILLED_SYRINGE | SUBCUTANEOUS | 1 refills | Status: DC

## 2022-10-16 MED ORDER — INSULIN LISPRO 100 UNIT/ML IJ SOLN
INTRAMUSCULAR | 2 refills | Status: DC
Start: 2022-10-16 — End: 2022-11-03

## 2022-10-16 MED ORDER — PIOGLITAZONE HCL 30 MG PO TABS
30.0000 mg | ORAL_TABLET | Freq: Every day | ORAL | 3 refills | Status: DC
Start: 2022-10-16 — End: 2022-11-03

## 2022-10-16 MED ORDER — LEVOTHYROXINE SODIUM 25 MCG PO TABS
25.0000 ug | ORAL_TABLET | Freq: Every day | ORAL | 3 refills | Status: DC
Start: 2022-10-16 — End: 2022-11-03

## 2022-10-16 MED ORDER — OMNIPOD 5 DEXG7G6 PODS GEN 5 MISC
1.0000 | 3 refills | Status: DC
Start: 2022-10-16 — End: 2022-11-03

## 2022-10-18 ENCOUNTER — Other Ambulatory Visit: Payer: Self-pay | Admitting: Endocrinology

## 2022-10-18 DIAGNOSIS — E119 Type 2 diabetes mellitus without complications: Secondary | ICD-10-CM

## 2022-10-23 ENCOUNTER — Telehealth: Payer: Self-pay | Admitting: Internal Medicine

## 2022-10-23 ENCOUNTER — Other Ambulatory Visit: Payer: Self-pay

## 2022-10-23 DIAGNOSIS — E118 Type 2 diabetes mellitus with unspecified complications: Secondary | ICD-10-CM

## 2022-10-23 DIAGNOSIS — E876 Hypokalemia: Secondary | ICD-10-CM

## 2022-10-23 MED ORDER — LINACLOTIDE 145 MCG PO CAPS
ORAL_CAPSULE | ORAL | 0 refills | Status: DC
Start: 2022-10-23 — End: 2023-04-20

## 2022-10-23 MED ORDER — HYDROCHLOROTHIAZIDE 25 MG PO TABS: 25.0000 mg | ORAL_TABLET | Freq: Every day | ORAL | 2 refills | Status: AC

## 2022-10-23 MED ORDER — ATORVASTATIN CALCIUM 20 MG PO TABS
20.0000 mg | ORAL_TABLET | Freq: Every day | ORAL | 0 refills | Status: DC
Start: 2022-10-23 — End: 2023-04-20

## 2022-10-23 MED ORDER — ONETOUCH DELICA PLUS LANCET33G MISC
0 refills | Status: DC
Start: 2022-10-23 — End: 2022-11-16

## 2022-10-23 MED ORDER — METOPROLOL SUCCINATE ER 25 MG PO TB24
25.0000 mg | ORAL_TABLET | Freq: Every day | ORAL | 2 refills | Status: DC
Start: 2022-10-23 — End: 2023-05-03

## 2022-10-23 MED ORDER — ONETOUCH VERIO VI STRP
ORAL_STRIP | 0 refills | Status: DC
Start: 2022-10-23 — End: 2023-04-20

## 2022-10-23 NOTE — Telephone Encounter (Signed)
Pt's medication was sent to pt's pharmacy as requested. Confirmation received.  °

## 2022-10-23 NOTE — Telephone Encounter (Signed)
Pt c/o medication issue:  1. Name of Medication:   metoprolol succinate (TOPROL-XL) 25 MG 24 hr tablet  hydrochlorothiazide (HYDRODIURIL) 25 MG tablet   2. How are you currently taking this medication (dosage and times per day)?   3. Are you having a reaction (difficulty breathing--STAT)?   4. What is your medication issue?   Caller stated patient recently changed pharmacies to [facility] [place] - [address] and will need a new prescription sent for these medications.

## 2022-11-03 ENCOUNTER — Other Ambulatory Visit: Payer: Self-pay

## 2022-11-03 DIAGNOSIS — E118 Type 2 diabetes mellitus with unspecified complications: Secondary | ICD-10-CM

## 2022-11-03 DIAGNOSIS — E119 Type 2 diabetes mellitus without complications: Secondary | ICD-10-CM

## 2022-11-03 DIAGNOSIS — E1165 Type 2 diabetes mellitus with hyperglycemia: Secondary | ICD-10-CM

## 2022-11-03 MED ORDER — OMNIPOD 5 G6 PODS (GEN 5) MISC
1.0000 | 3 refills | Status: AC
Start: 2022-11-03 — End: ?

## 2022-11-03 MED ORDER — INSULIN LISPRO 100 UNIT/ML IJ SOLN
INTRAMUSCULAR | 2 refills | Status: AC
Start: 2022-11-03 — End: ?

## 2022-11-03 MED ORDER — PIOGLITAZONE HCL 30 MG PO TABS
30.0000 mg | ORAL_TABLET | Freq: Every day | ORAL | 3 refills | Status: AC
Start: 2022-11-03 — End: ?

## 2022-11-03 MED ORDER — LEVOTHYROXINE SODIUM 25 MCG PO TABS
25.0000 ug | ORAL_TABLET | Freq: Every day | ORAL | 3 refills | Status: DC
Start: 2022-11-03 — End: 2023-06-18

## 2022-11-03 MED ORDER — EASY TOUCH ALCOHOL PREP MEDIUM 70 % PADS: 1.0000 | MEDICATED_PAD | 5 refills | Status: AC

## 2022-11-04 ENCOUNTER — Telehealth: Payer: Self-pay

## 2022-11-04 DIAGNOSIS — E118 Type 2 diabetes mellitus with unspecified complications: Secondary | ICD-10-CM

## 2022-11-04 MED ORDER — TOUJEO SOLOSTAR 300 UNIT/ML ~~LOC~~ SOPN
35.0000 [IU] | PEN_INJECTOR | Freq: Every day | SUBCUTANEOUS | 3 refills | Status: DC
Start: 2022-11-04 — End: 2023-05-18

## 2022-11-04 NOTE — Telephone Encounter (Signed)
Refill request fax received for Lantus 100U/mL  Per chart patient is no longer on this medication. Toujeo is on current medication list however it shows as historical and has no sig attached. Per historical med list patient was previously on Toujeo 35U QD.   Ok to send Toujeo per Dr. Lucianne Muss. Rx sent to pharmacy as requested.

## 2022-11-14 ENCOUNTER — Other Ambulatory Visit: Payer: Self-pay | Admitting: Internal Medicine

## 2022-11-14 DIAGNOSIS — E118 Type 2 diabetes mellitus with unspecified complications: Secondary | ICD-10-CM

## 2022-11-18 DIAGNOSIS — E1165 Type 2 diabetes mellitus with hyperglycemia: Secondary | ICD-10-CM | POA: Diagnosis not present

## 2022-11-23 ENCOUNTER — Ambulatory Visit (INDEPENDENT_AMBULATORY_CARE_PROVIDER_SITE_OTHER): Payer: Medicare Other

## 2022-11-23 DIAGNOSIS — I495 Sick sinus syndrome: Secondary | ICD-10-CM | POA: Diagnosis not present

## 2022-11-27 LAB — CUP PACEART REMOTE DEVICE CHECK
Battery Impedance: 729 Ohm
Battery Remaining Longevity: 84 mo
Battery Voltage: 2.78 V
Brady Statistic AP VP Percent: 0 %
Brady Statistic AP VS Percent: 10 %
Brady Statistic AS VP Percent: 0 %
Brady Statistic AS VS Percent: 90 %
Date Time Interrogation Session: 20240523165751
Implantable Lead Connection Status: 753985
Implantable Lead Connection Status: 753985
Implantable Lead Implant Date: 20080711
Implantable Lead Implant Date: 20150918
Implantable Lead Location: 753859
Implantable Lead Location: 753860
Implantable Lead Model: 5076
Implantable Lead Model: 5076
Implantable Pulse Generator Implant Date: 20150918
Lead Channel Impedance Value: 508 Ohm
Lead Channel Impedance Value: 530 Ohm
Lead Channel Pacing Threshold Amplitude: 0.75 V
Lead Channel Pacing Threshold Amplitude: 0.75 V
Lead Channel Pacing Threshold Pulse Width: 0.4 ms
Lead Channel Pacing Threshold Pulse Width: 0.4 ms
Lead Channel Setting Pacing Amplitude: 2 V
Lead Channel Setting Pacing Amplitude: 2.5 V
Lead Channel Setting Pacing Pulse Width: 0.4 ms
Lead Channel Setting Sensing Sensitivity: 2 mV
Zone Setting Status: 755011
Zone Setting Status: 755011

## 2022-12-09 ENCOUNTER — Encounter: Payer: Self-pay | Admitting: "Endocrinology

## 2022-12-09 ENCOUNTER — Ambulatory Visit (INDEPENDENT_AMBULATORY_CARE_PROVIDER_SITE_OTHER): Payer: Medicare Other | Admitting: "Endocrinology

## 2022-12-09 VITALS — BP 110/70 | HR 77 | Ht 72.0 in | Wt 168.6 lb

## 2022-12-09 DIAGNOSIS — Z9641 Presence of insulin pump (external) (internal): Secondary | ICD-10-CM

## 2022-12-09 DIAGNOSIS — E118 Type 2 diabetes mellitus with unspecified complications: Secondary | ICD-10-CM

## 2022-12-09 DIAGNOSIS — E1165 Type 2 diabetes mellitus with hyperglycemia: Secondary | ICD-10-CM | POA: Diagnosis not present

## 2022-12-09 DIAGNOSIS — Z7984 Long term (current) use of oral hypoglycemic drugs: Secondary | ICD-10-CM

## 2022-12-09 DIAGNOSIS — E782 Mixed hyperlipidemia: Secondary | ICD-10-CM

## 2022-12-09 LAB — POCT GLYCOSYLATED HEMOGLOBIN (HGB A1C): Hemoglobin A1C: 7.2 % — AB (ref 4.0–5.6)

## 2022-12-09 MED ORDER — DAPAGLIFLOZIN PROPANEDIOL 10 MG PO TABS: 10.0000 mg | ORAL_TABLET | Freq: Every day | ORAL | 1 refills | Status: AC

## 2022-12-09 NOTE — Progress Notes (Signed)
Outpatient Endocrinology Note Michael Prentiss, MD  12/09/22   Michael Snyder 1950/08/10 [id number]  Referring Provider: Reather Littler, MD Primary Care Provider: Reather Littler, MD Reason for consultation: Subjective   Assessment & Plan  Diagnoses and all orders for this visit:  Diabetes mellitus type 2 with complications (HCC) -     POCT glycosylated hemoglobin (Hb A1C)    Diabetes complicated by hyperglycemia. Hba1c goal less than 7.0, current Hba1c is 7.2. Will recommend the following: Start bolusing for ech meal 5 min before, pt doesn't count carbs and uses fixed bolus of 5-7 units Pt plans to call omnipod support to address why he gets out of automode  Continue OMNIPOD insulin pump settings currently: Basal rate = 0.3 units an hour all 24 hours Carb ratio 1:1 Sensitivity 1: 50 Target 120 and correction threshold 120  Non-insulin hypoglycemic drugs: Farxiga 10 mg qd, Rybelsus 14 mg qd and Actos 30 mg qd  No known contraindications to any of above medications No history of heart attack/heart failure/pedal edema/urinary bladder cancer   Glucagon - has it, discussed its use  Hyperlipidemia -Last LDL at goal: 50 -on atorvastatin 20 mg QD -Follow low fat diet and exercise    -Blood pressure goal <140/90 - Microalbumin/creatinine at goal < 30 -not on ACE/ARB  -diet changes including salt restriction -limit eating outside -counseled BP targets per standards of diabetes care -Uncontrolled blood pressure can lead to retinopathy, nephropathy and cardiovascular and atherosclerotic heart disease  Reviewed and counseled on: -A1C target -Blood sugar targets -Complications of uncontrolled diabetes  -Checking blood sugar before meals and bedtime and bring log next visit -All medications with mechanism of action and side effects -Hypoglycemia management: rule of 15's, Glucagon Emergency Kit and medical alert ID -low-carb low-fat plate-method diet -At least 20 minutes of  physical activity per day -Annual dilated retinal eye exam and foot exam -compliance and follow up needs -follow up as scheduled or earlier if problem gets worse  Call if blood sugar is less than 70 or consistently above 250    Take a 15 gm snack of carbohydrate at bedtime before you go to sleep if your blood sugar is less than 100.    If you are going to fast after midnight for a test or procedure, ask your physician for instructions on how to reduce/decrease your insulin dose.    Call if blood sugar is less than 70 or consistently above 250  -Treating a low sugar by rule of 15  (15 gms of sugar every 15 min until sugar is more than 70) If you feel your sugar is low, test your sugar to be sure If your sugar is low (less than 70), then take 15 grams of a fast acting Carbohydrate (3-4 glucose tablets or glucose gel or 4 ounces of juice or regular soda) Recheck your sugar 15 min after treating low to make sure it is more than 70 If sugar is still less than 70, treat again with 15 grams of carbohydrate          Don't drive the hour of hypoglycemia  If unconscious/unable to eat or drink by mouth, use glucagon injection or nasal spray baqsimi and call 911. Can repeat again in 15 min if still unconscious.  Return in about 3 months (around 03/11/2023).   I have reviewed current medications, nurse's notes, allergies, vital signs, past medical and surgical history, family medical history, and social history for this encounter. Counseled patient on symptoms, examination  findings, lab findings, imaging results, treatment decisions and monitoring and prognosis. The patient understood the recommendations and agrees with the treatment plan. All questions regarding treatment plan were fully answered.  Michael Gosnell, MD  12/09/22    History of Present Illness Michael Snyder is a 72 y.o. year old male who presents for evaluation of Type 2 diabetes mellitus.  Michael Snyder was first diagnosed in 2008.    Diabetes education +  Home diabetes regimen: OMNIPOD insulin pump settings currently: Basal rate = 0.3 units an hour all 24 hours Carb ratio 1:1 Sensitivity 1: 50 Target 120 and correction threshold 120  Non-insulin hypoglycemic drugs: Farxiga, Rybelsus and Actos  Previous history: Oral hypoglycemic drugs previously used are: Glipizide, Actos, metformin Insulin was started in 2020 with Tresiba, subsequently Lantus and Humalog as well as 75/25 Lantus 5 units in the evening and Humalog 5-7 units before meals  Recent history:     COMPLICATIONS -  MI/Stroke -  retinopathy -  neuropathy +  nephropathy  BLOOD SUGAR DATA  CGM interpretation: At today's visit, we reviewed her CGM downloads. The full report is scanned in the media. Reviewing the CGM trends, elevated blood sugars around lunch and super time  Physical Exam  BP 110/70   Pulse 77   Ht 6' (1.829 m)   Wt 168 lb 9.6 oz (76.5 kg)   SpO2 98%   BMI 22.87 kg/m    Constitutional: well developed, well nourished Head: normocephalic, atraumatic Eyes: sclera anicteric, no redness Neck: supple Lungs: normal respiratory effort Neurology: alert and oriented Skin: dry, no appreciable rashes Musculoskeletal: no appreciable defects Psychiatric: normal mood and affect Diabetic Foot Exam - Simple   No data filed      Current Medications Patient's Medications  New Prescriptions   No medications on file  Previous Medications   ALCOHOL SWABS (EASY TOUCH ALCOHOL PREP MEDIUM) 70 % PADS    Apply 1 each topically as directed.   AMLODIPINE (NORVASC) 2.5 MG TABLET    Take 2.5 mg by mouth daily.   ASPIRIN EC 81 MG TABLET    Take 81 mg daily by mouth.   ATORVASTATIN (LIPITOR) 20 MG TABLET    Take 1 tablet (20 mg total) by mouth daily. Annual appt due in Sept must see provider for future refills   BLOOD PRESSURE MONITORING (BLOOD PRESSURE KIT) DEVI    1 kit by Does not apply route daily.   BRIMONIDINE (ALPHAGAN) 0.2 % OPHTHALMIC  SOLUTION    SMARTSIG:In Eye(s)   CALCIUM CARBONATE-VITAMIN D (CALTRATE 600+D) 600-400 MG-UNIT PER TABLET    Take 1 tablet by mouth 2 (two) times daily.   CHOLECALCIFEROL (VITAMIN D) 50 MCG (2000 UT) TABLET    Take 2,000 Units by mouth daily.   CONTINUOUS BLOOD GLUC RECEIVER (DEXCOM G6 RECEIVER) DEVI    Use as directed to check blood sugar.   CONTINUOUS BLOOD GLUC SENSOR (DEXCOM G6 SENSOR) MISC    Change every 10 days   CONTINUOUS BLOOD GLUC TRANSMIT (DEXCOM G6 TRANSMITTER) MISC    Change every 3 months   DAPAGLIFLOZIN PROPANEDIOL (FARXIGA) 10 MG TABS TABLET    Take 10 mg by mouth daily.   GLUCOSE BLOOD (ONETOUCH VERIO) TEST STRIP    TEST TWICE DAILY   GVOKE HYPOPEN 1-PACK 1 MG/0.2ML SOAJ    Inject into the skin.   HYDROCHLOROTHIAZIDE (HYDRODIURIL) 25 MG TABLET    Take 1 tablet (25 mg total) by mouth daily.   INSULIN DISPOSABLE PUMP (OMNIPOD 5 G6  PODS, GEN 5,) MISC    1 Device by Does not apply route every 3 (three) days.   INSULIN LISPRO (HUMALOG) 100 UNIT/ML INJECTION    INJECT SUBCUTANEOUSLY 50 UNITS  DAILY VIA INSULIN PUMP   INSULIN PEN NEEDLE (PEN NEEDLES) 32G X 4 MM MISC    Use as Directed 3 times daily to inject insulin   LANCETS (ONETOUCH DELICA PLUS LANCET33G) MISC    CHECK BLOOD SUGAR TWICE DAILY   LATANOPROST (XALATAN) 0.005 % OPHTHALMIC SOLUTION    Place 1 drop into both eyes at bedtime.   LEVOTHYROXINE (SYNTHROID) 25 MCG TABLET    Take 1 tablet (25 mcg total) by mouth daily before breakfast.   LINACLOTIDE (LINZESS) 145 MCG CAPS CAPSULE    TAKE 1 CAPSULE BY MOUTH DAILY  BEFORE BREAKFAST   MELOXICAM (MOBIC) 15 MG TABLET    Take 1 tablet (15 mg total) by mouth daily.   MELOXICAM (MOBIC) 7.5 MG TABLET    Take 7.5 mg by mouth daily.   METOPROLOL SUCCINATE (TOPROL-XL) 25 MG 24 HR TABLET    Take 1 tablet (25 mg total) by mouth daily.   MULTIPLE VITAMIN (MULTIVITAMIN WITH MINERALS) TABS TABLET    Take 1 tablet by mouth daily.   OMEGA-3 FATTY ACIDS (FISH OIL) 300 MG CAPS    Take 1 capsule  (300 mg total) by mouth daily.   PIOGLITAZONE (ACTOS) 30 MG TABLET    Take 1 tablet (30 mg total) by mouth daily.   POLYETHYLENE GLYCOL POWDER (GLYCOLAX/MIRALAX) 17 GM/SCOOP POWDER    Take 17 g by mouth 2 (two) times daily as needed.   POTASSIUM CHLORIDE (MICRO-K) 10 MEQ CR CAPSULE    Take 10 mEq by mouth 2 (two) times daily.   RYBELSUS 14 MG TABS    TAKE 1 TABLET BY MOUTH DAILY   TOUJEO SOLOSTAR 300 UNIT/ML SOLOSTAR PEN    Inject 35 Units into the skin daily.   TRIAMCINOLONE OINTMENT (KENALOG) 0.5 %    APPLY TO AFFECTED AREA(S)  TOPICALLY TWICE DAILY  Modified Medications   No medications on file  Discontinued Medications   No medications on file    Allergies Allergies  Allergen Reactions   Clindamycin/Lincomycin Swelling    Possible facial and lip swelling   Ace Inhibitors     Facial and lip swelling possibly associated    Past Medical History 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  Social History Social History   Socioeconomic History   Marital status: Legally Separated    Spouse name: Not on file  Number of children: 3   Years of education: Not on file   Highest education level: Not on file  Occupational  History   Occupation: Music therapist, truck driver  Tobacco Use   Smoking status: Never   Smokeless tobacco: Never  Vaping Use   Vaping Use: Never used  Substance and Sexual Activity   Alcohol use: No    Comment: quit 20 yrs ago   Drug use: No   Sexual activity: Yes    Partners: Female  Other Topics Concern   Not on file  Social History Narrative   12th grade education. H/O substance abuse- now abstemious and works with other in recovery..Attends NA on a regular basis and serves as a sponsor. Married 18 years-divorced. Married 5 years 2nd marriage.3 daughters previous marriage, 9 grandchildren, 3 great-grands. Work- short Production assistant, radio            Social Determinants of Corporate investment banker Strain: Low Risk  (02/20/2020)   Overall Financial Resource Strain (CARDIA)    Difficulty of Paying Living Expenses: Not hard at all  Food Insecurity: No Food Insecurity (02/20/2020)   Hunger Vital Sign    Worried About Running Out of Food in the Last Year: Never true    Ran Out of Food in the Last Year: Never true  Transportation Needs: No Transportation Needs (02/20/2020)   PRAPARE - Administrator, Civil Service (Medical): No    Lack of Transportation (Non-Medical): No  Physical Activity: Sufficiently Active (02/20/2020)   Exercise Vital Sign    Days of Exercise per Week: 7 days    Minutes of Exercise per Session: 150+ min  Stress: No Stress Concern Present (02/13/2022)   [employer] of Occupational Health - Occupational Stress Questionnaire    Feeling of Stress : Only a little  Social Connections: Moderately Integrated (02/20/2020)   Social Connection and Isolation Panel [NHANES]    Frequency of Communication with Friends and Family: More than three times a week    Frequency of Social Gatherings with Friends and Family: Once a week    Attends Religious Services: 1 to 4 times per year    Active Member of [employer] or Organizations: Yes    Attends Banker  Meetings: 1 to 4 times per year    Marital Status: Divorced  Catering manager Violence: Not At Risk (02/20/2020)   Humiliation, Afraid, Rape, and Kick questionnaire    Fear of Current or Ex-Partner: No    Emotionally Abused: No    Physically Abused: No    Sexually Abused: No    Lab Results  Component Value Date   HGBA1C 7.2 (A) 12/09/2022   HGBA1C 9.5 (A) 09/02/2022   HGBA1C 8.1 (H) 02/27/2022   Lab Results  Component Value Date   CHOL 113 02/27/2022   Lab Results  Component Value Date   HDL 49.40 02/27/2022   Lab Results  Component Value Date   LDLCALC 50 02/27/2022   Lab Results  Component Value Date   TRIG 68.0 02/27/2022   Lab Results  Component Value Date   CHOLHDL 2 02/27/2022   Lab Results  Component Value Date   CREATININE 1.87 (H) 07/24/2022   Lab Results  Component Value Date   GFR 34.17 (L) 02/27/2022   Lab Results  Component Value Date   MICROALBUR 4.8 (H) 02/27/2022      Component Value Date/Time   NA 138 07/24/2022 0924   K 4.4 07/24/2022 1610  CL 101 07/24/2022 0924   CO2 23 07/24/2022 0924   GLUCOSE 183 (H) 08/31/2022 1125   BUN 19 07/24/2022 0924   CREATININE 1.87 (H) 07/24/2022 0924   CREATININE 1.63 (H) 05/02/2015 1106   CALCIUM 9.1 07/24/2022 0924   PROT 6.4 09/11/2021 1031   ALBUMIN 3.9 09/11/2021 1031   AST 40 (H) 09/11/2021 1031   ALT 27 09/11/2021 1031   ALKPHOS 78 09/11/2021 1031   BILITOT 0.9 09/11/2021 1031   GFRNONAA 45 (L) 07/14/2020 1823   GFRAA 54 (L) 11/29/2018 1511      Latest Ref Rng & Units 08/31/2022   11:25 AM 07/24/2022    9:24 AM 02/27/2022    9:59 AM  BMP  Glucose 70 - 99 mg/dL 161  096  045   BUN 8 - 27 mg/dL  19  18   Creatinine 4.09 - 1.27 mg/dL  8.11  9.14   BUN/Creat Ratio 10 - 24  10    Sodium 134 - 144 mmol/L  138  141   Potassium 3.5 - 5.2 mmol/L  4.4  4.0   Chloride 96 - 106 mmol/L  101  105   CO2 20 - 29 mmol/L  23  29   Calcium 8.6 - 10.2 mg/dL  9.1  9.0        Component Value  Date/Time   WBC 5.8 09/11/2021 1031   RBC 4.27 09/11/2021 1031   HGB 12.6 (L) 09/11/2021 1031   HCT 38.7 (L) 09/11/2021 1031   PLT 132.0 (L) 09/11/2021 1031   MCV 90.4 09/11/2021 1031   MCH 29.4 07/14/2020 1823   MCHC 32.7 09/11/2021 1031   RDW 13.5 09/11/2021 1031   LYMPHSABS 1.4 11/29/2018 1511   MONOABS 0.6 11/29/2018 1511   EOSABS 0.0 11/29/2018 1511   BASOSABS 0.0 11/29/2018 1511     Parts of this note may have been dictated using voice recognition software. There may be variances in spelling and vocabulary which are unintentional. Not all errors are proofread. Please notify the Thereasa Parkin if any discrepancies are noted or if the meaning of any statement is not clear.

## 2022-12-16 ENCOUNTER — Other Ambulatory Visit: Payer: Self-pay | Admitting: Endocrinology

## 2022-12-16 DIAGNOSIS — E119 Type 2 diabetes mellitus without complications: Secondary | ICD-10-CM

## 2022-12-18 DIAGNOSIS — E1165 Type 2 diabetes mellitus with hyperglycemia: Secondary | ICD-10-CM | POA: Diagnosis not present

## 2022-12-22 NOTE — Progress Notes (Signed)
Remote pacemaker transmission.   

## 2022-12-23 ENCOUNTER — Telehealth: Payer: Self-pay

## 2022-12-23 MED ORDER — RYBELSUS 14 MG PO TABS: 1.0000 | ORAL_TABLET | Freq: Every day | ORAL | 1 refills | Status: DC

## 2022-12-23 NOTE — Telephone Encounter (Signed)
Michael Snyder is calling asking if he can get his RYBELSUS 14 MG TABS refilled , please advise

## 2022-12-23 NOTE — Telephone Encounter (Signed)
Refill has been sent to the [facility]

## 2022-12-31 ENCOUNTER — Telehealth: Payer: Self-pay

## 2022-12-31 NOTE — Telephone Encounter (Signed)
Patient advised that the [employer] patient assistance was approved. He would like to come by and fill out application for the [place] as well.

## 2023-01-15 ENCOUNTER — Telehealth: Payer: Self-pay

## 2023-01-15 NOTE — Telephone Encounter (Signed)
Patient picked up Rybelsus patient assistance and Farxiga application - log noted

## 2023-01-15 NOTE — Telephone Encounter (Signed)
Patient advised that the [employer] patient assistance is ready for pick up.( Rybelsus). Patient will also pick up the application for assistance for Farxiga. It will be with the medication .

## 2023-01-17 DIAGNOSIS — Z794 Long term (current) use of insulin: Secondary | ICD-10-CM | POA: Diagnosis not present

## 2023-01-17 DIAGNOSIS — E1165 Type 2 diabetes mellitus with hyperglycemia: Secondary | ICD-10-CM | POA: Diagnosis not present

## 2023-02-09 DIAGNOSIS — N1832 Chronic kidney disease, stage 3b: Secondary | ICD-10-CM | POA: Diagnosis not present

## 2023-02-10 DIAGNOSIS — I495 Sick sinus syndrome: Secondary | ICD-10-CM | POA: Diagnosis not present

## 2023-02-10 DIAGNOSIS — I129 Hypertensive chronic kidney disease with stage 1 through stage 4 chronic kidney disease, or unspecified chronic kidney disease: Secondary | ICD-10-CM | POA: Diagnosis not present

## 2023-02-10 DIAGNOSIS — N2581 Secondary hyperparathyroidism of renal origin: Secondary | ICD-10-CM | POA: Diagnosis not present

## 2023-02-10 DIAGNOSIS — N281 Cyst of kidney, acquired: Secondary | ICD-10-CM | POA: Diagnosis not present

## 2023-02-10 DIAGNOSIS — E1122 Type 2 diabetes mellitus with diabetic chronic kidney disease: Secondary | ICD-10-CM | POA: Diagnosis not present

## 2023-02-10 DIAGNOSIS — E876 Hypokalemia: Secondary | ICD-10-CM | POA: Diagnosis not present

## 2023-02-10 DIAGNOSIS — N1832 Chronic kidney disease, stage 3b: Secondary | ICD-10-CM | POA: Diagnosis not present

## 2023-02-10 DIAGNOSIS — N200 Calculus of kidney: Secondary | ICD-10-CM | POA: Diagnosis not present

## 2023-02-16 DIAGNOSIS — Z794 Long term (current) use of insulin: Secondary | ICD-10-CM | POA: Diagnosis not present

## 2023-02-16 DIAGNOSIS — E1165 Type 2 diabetes mellitus with hyperglycemia: Secondary | ICD-10-CM | POA: Diagnosis not present

## 2023-02-18 DIAGNOSIS — H47012 Ischemic optic neuropathy, left eye: Secondary | ICD-10-CM | POA: Diagnosis not present

## 2023-02-18 DIAGNOSIS — H26491 Other secondary cataract, right eye: Secondary | ICD-10-CM | POA: Diagnosis not present

## 2023-02-18 DIAGNOSIS — H401232 Low-tension glaucoma, bilateral, moderate stage: Secondary | ICD-10-CM | POA: Diagnosis not present

## 2023-02-22 ENCOUNTER — Other Ambulatory Visit: Payer: Self-pay | Admitting: Endocrinology

## 2023-02-22 DIAGNOSIS — E119 Type 2 diabetes mellitus without complications: Secondary | ICD-10-CM

## 2023-02-24 ENCOUNTER — Telehealth: Payer: Self-pay

## 2023-02-24 NOTE — Telephone Encounter (Signed)
I ordered the patient a new monitor. He should receive it in 7-10 business days. 

## 2023-02-25 ENCOUNTER — Other Ambulatory Visit: Payer: Self-pay | Admitting: Endocrinology

## 2023-02-25 DIAGNOSIS — E119 Type 2 diabetes mellitus without complications: Secondary | ICD-10-CM

## 2023-03-02 ENCOUNTER — Ambulatory Visit (INDEPENDENT_AMBULATORY_CARE_PROVIDER_SITE_OTHER): Payer: Medicare Other

## 2023-03-02 DIAGNOSIS — I495 Sick sinus syndrome: Secondary | ICD-10-CM

## 2023-03-03 LAB — CUP PACEART REMOTE DEVICE CHECK
Battery Impedance: 806 Ohm
Battery Remaining Longevity: 79 mo
Battery Voltage: 2.77 V
Brady Statistic AP VP Percent: 0 %
Brady Statistic AP VS Percent: 10 %
Brady Statistic AS VP Percent: 0 %
Brady Statistic AS VS Percent: 89 %
Date Time Interrogation Session: 20240827162751
Implantable Lead Connection Status: 753985
Implantable Lead Connection Status: 753985
Implantable Lead Implant Date: 20080711
Implantable Lead Implant Date: 20150918
Implantable Lead Location: 753859
Implantable Lead Location: 753860
Implantable Lead Model: 5076
Implantable Lead Model: 5076
Implantable Pulse Generator Implant Date: 20150918
Lead Channel Impedance Value: 493 Ohm
Lead Channel Impedance Value: 497 Ohm
Lead Channel Pacing Threshold Amplitude: 0.625 V
Lead Channel Pacing Threshold Amplitude: 0.875 V
Lead Channel Pacing Threshold Pulse Width: 0.4 ms
Lead Channel Pacing Threshold Pulse Width: 0.4 ms
Lead Channel Setting Pacing Amplitude: 2 V
Lead Channel Setting Pacing Amplitude: 2.5 V
Lead Channel Setting Pacing Pulse Width: 0.4 ms
Lead Channel Setting Sensing Sensitivity: 2 mV
Zone Setting Status: 755011
Zone Setting Status: 755011

## 2023-03-12 ENCOUNTER — Telehealth: Payer: Self-pay

## 2023-03-12 NOTE — Telephone Encounter (Signed)
I spoke to Mr Kroth and advised him that his patient assistance is in he has #4 boxes of Rybelsus 14 mg ready to be picked up

## 2023-03-15 NOTE — Progress Notes (Signed)
Remote pacemaker transmission.   

## 2023-03-25 DIAGNOSIS — H524 Presbyopia: Secondary | ICD-10-CM | POA: Diagnosis not present

## 2023-03-25 DIAGNOSIS — H5203 Hypermetropia, bilateral: Secondary | ICD-10-CM | POA: Diagnosis not present

## 2023-03-25 DIAGNOSIS — H52223 Regular astigmatism, bilateral: Secondary | ICD-10-CM | POA: Diagnosis not present

## 2023-03-25 DIAGNOSIS — H401232 Low-tension glaucoma, bilateral, moderate stage: Secondary | ICD-10-CM | POA: Diagnosis not present

## 2023-03-25 DIAGNOSIS — H35373 Puckering of macula, bilateral: Secondary | ICD-10-CM | POA: Diagnosis not present

## 2023-03-25 DIAGNOSIS — Z961 Presence of intraocular lens: Secondary | ICD-10-CM | POA: Diagnosis not present

## 2023-03-25 DIAGNOSIS — E119 Type 2 diabetes mellitus without complications: Secondary | ICD-10-CM | POA: Diagnosis not present

## 2023-03-25 LAB — HM DIABETES EYE EXAM

## 2023-03-31 ENCOUNTER — Other Ambulatory Visit: Payer: Self-pay

## 2023-03-31 ENCOUNTER — Encounter: Payer: Self-pay | Admitting: "Endocrinology

## 2023-03-31 ENCOUNTER — Ambulatory Visit (INDEPENDENT_AMBULATORY_CARE_PROVIDER_SITE_OTHER): Payer: Medicare Other | Admitting: "Endocrinology

## 2023-03-31 VITALS — BP 117/83 | HR 73 | Ht 72.0 in | Wt 176.4 lb

## 2023-03-31 DIAGNOSIS — E782 Mixed hyperlipidemia: Secondary | ICD-10-CM | POA: Diagnosis not present

## 2023-03-31 DIAGNOSIS — E11621 Type 2 diabetes mellitus with foot ulcer: Secondary | ICD-10-CM

## 2023-03-31 DIAGNOSIS — Z7984 Long term (current) use of oral hypoglycemic drugs: Secondary | ICD-10-CM | POA: Diagnosis not present

## 2023-03-31 DIAGNOSIS — E1165 Type 2 diabetes mellitus with hyperglycemia: Secondary | ICD-10-CM

## 2023-03-31 DIAGNOSIS — Z9641 Presence of insulin pump (external) (internal): Secondary | ICD-10-CM | POA: Diagnosis not present

## 2023-03-31 LAB — POCT GLYCOSYLATED HEMOGLOBIN (HGB A1C): Hemoglobin A1C: 6.7 % — AB (ref 4.0–5.6)

## 2023-03-31 MED ORDER — TRIAMCINOLONE ACETONIDE 0.5 % EX OINT
TOPICAL_OINTMENT | CUTANEOUS | 0 refills | Status: DC
Start: 2023-03-31 — End: 2023-08-06

## 2023-03-31 NOTE — Patient Instructions (Signed)

## 2023-03-31 NOTE — Progress Notes (Signed)
Outpatient Endocrinology Note [facility] [place], MD  03/31/23   Michael Snyder 1950-08-24 [id number]  Referring Provider: Reather Littler, MD Primary Care Provider: Reather Littler, MD (Inactive) Reason for consultation: Subjective   Assessment & Plan  Diagnoses and all orders for this visit:  Uncontrolled type 2 diabetes mellitus with hyperglycemia (HCC) -     Hemoglobin A1c; Future -     Lipid panel; Future -     Microalbumin / creatinine urine ratio; Future -     Comprehensive metabolic panel; Future  Type 2 diabetes mellitus with hyperglycemia, with long-term current use of insulin (HCC) -     POCT glycosylated hemoglobin (Hb A1C)    Diabetes complicated by hyperglycemia. Hba1c goal less than 7.0, current Hba1c is 6.7. Will recommend the following: Stop toujeo 6 units at bedtime (only take if insulin pump fails)  Start bolusing for each meal 5 min before, increase to fixed bolus of 5 units Pt doesn't count carbs   Continue OMNIPOD insulin pump settings currently on AUTOMODE with DexCom: Basal rate = 0.3 units an hour all 24 hours Carb ratio 1:1 Sensitivity 1: 50 Target 120 and correction threshold 120  Non-insulin hypoglycemic drugs: Farxiga 10 mg qd, Rybelsus 14 mg qd and Actos 30 mg qd  No known contraindications to any of above medications No history of heart attack/heart failure/pedal edema/urinary bladder cancer   Glucagon - has it, discussed its use previously   Hyperlipidemia -Last LDL at goal: 50 -on atorvastatin 20 mg QD -Follow low fat diet and exercise    -Blood pressure goal <140/90 - Microalbumin/creatinine at goal < 30 -not on ACE/ARB  -diet changes including salt restriction -limit eating outside -counseled BP targets per standards of diabetes care -Uncontrolled blood pressure can lead to retinopathy, nephropathy and cardiovascular and atherosclerotic heart disease  Reviewed and counseled on: -A1C target -Blood sugar targets -Complications  of uncontrolled diabetes  -Checking blood sugar before meals and bedtime and bring log next visit -All medications with mechanism of action and side effects -Hypoglycemia management: rule of 15's, Glucagon Emergency Kit and medical alert ID -low-carb low-fat plate-method diet -At least 20 minutes of physical activity per day -Annual dilated retinal eye exam and foot exam -compliance and follow up needs -follow up as scheduled or earlier if problem gets worse  Call if blood sugar is less than 70 or consistently above 250    Take a 15 gm snack of carbohydrate at bedtime before you go to sleep if your blood sugar is less than 100.    If you are going to fast after midnight for a test or procedure, ask your physician for instructions on how to reduce/decrease your insulin dose.    Call if blood sugar is less than 70 or consistently above 250  -Treating a low sugar by rule of 15  (15 gms of sugar every 15 min until sugar is more than 70) If you feel your sugar is low, test your sugar to be sure If your sugar is low (less than 70), then take 15 grams of a fast acting Carbohydrate (3-4 glucose tablets or glucose gel or 4 ounces of juice or regular soda) Recheck your sugar 15 min after treating low to make sure it is more than 70 If sugar is still less than 70, treat again with 15 grams of carbohydrate          Don't drive the hour of hypoglycemia  If unconscious/unable to eat or drink by mouth,  use glucagon injection or nasal spray baqsimi and call 911. Can repeat again in 15 min if still unconscious.  Return in about 3 months (around 06/30/2023) for visit, fasting labs before next visit.   I have reviewed current medications, nurse's notes, allergies, vital signs, past medical and surgical history, family medical history, and social history for this encounter. Counseled patient on symptoms, examination findings, lab findings, imaging results, treatment decisions and monitoring and prognosis.  The patient understood the recommendations and agrees with the treatment plan. All questions regarding treatment plan were fully answered.  Michael Tyronza, MD  03/31/23    History of Present Illness Michael Snyder is a 72 y.o. year old male who presents for evaluation of Type 2 diabetes mellitus.  Michael Snyder was first diagnosed in 2008.   Diabetes education +  Home diabetes regimen: OMNIPOD insulin pump settings currently with DexCom: Basal rate = 0.3 units an hour all 24 hours Carb ratio 1:1 Sensitivity 1: 50 Target 120 and correction threshold 120 Pt doesn't count carbs: takes fixed bolus of 3 units  Non-insulin hypoglycemic drugs:  Farxiga 10 mg qd, Rybelsus 14 mg qd and Actos 30 mg qd  Previous history: Oral hypoglycemic drugs previously used are: Glipizide, Actos, metformin Insulin was started in 2020 with Tresiba, subsequently Lantus and Humalog as well as 75/25 Lantus 5 units in the evening and Humalog 5-7 units before meals  Recent history:     COMPLICATIONS -  MI/Stroke -  retinopathy -  neuropathy +  nephropathy  BLOOD SUGAR DATA  CGM interpretation: At today's visit, we reviewed her CGM downloads. The full report is scanned in the media. Reviewing the CGM trends, elevated blood sugars around lunch and super time  Physical Exam  BP 117/83   Pulse 73   Ht 6' (1.829 m)   Wt 176 lb 6.4 oz (80 kg)   SpO2 99%   BMI 23.92 kg/m    Constitutional: well developed, well nourished Head: normocephalic, atraumatic Eyes: sclera anicteric, no redness Neck: supple Lungs: normal respiratory effort Neurology: alert and oriented Skin: dry, no appreciable rashes Musculoskeletal: no appreciable defects Psychiatric: normal mood and affect Diabetic Foot Exam - Simple   No data filed      Current Medications Patient's Medications  New Prescriptions   No medications on file  Previous Medications   ALCOHOL SWABS (EASY TOUCH ALCOHOL PREP MEDIUM) 70 % PADS    Apply  1 each topically as directed.   AMLODIPINE (NORVASC) 2.5 MG TABLET    Take 2.5 mg by mouth daily.   ASPIRIN EC 81 MG TABLET    Take 81 mg daily by mouth.   ATORVASTATIN (LIPITOR) 20 MG TABLET    Take 1 tablet (20 mg total) by mouth daily. Annual appt due in Sept must see provider for future refills   BLOOD PRESSURE MONITORING (BLOOD PRESSURE KIT) DEVI    1 kit by Does not apply route daily.   BRIMONIDINE (ALPHAGAN) 0.2 % OPHTHALMIC SOLUTION    SMARTSIG:In Eye(s)   CALCIUM CARBONATE-VITAMIN D (CALTRATE 600+D) 600-400 MG-UNIT PER TABLET    Take 1 tablet by mouth 2 (two) times daily.   CHOLECALCIFEROL (VITAMIN D) 50 MCG (2000 UT) TABLET    Take 2,000 Units by mouth daily.   CONTINUOUS GLUCOSE RECEIVER (DEXCOM G6 RECEIVER) DEVI    USE AS DIRECTED TO CHECK BLOOD  SUGAR   CONTINUOUS GLUCOSE SENSOR (DEXCOM G6 SENSOR) MISC    CHANGE EVERY 10 DAYS   CONTINUOUS GLUCOSE TRANSMITTER (  DEXCOM G6 TRANSMITTER) MISC    CHANGE EVERY 3 MONTHS   DAPAGLIFLOZIN PROPANEDIOL (FARXIGA) 10 MG TABS TABLET    Take 1 tablet (10 mg total) by mouth daily.   GLUCOSE BLOOD (ONETOUCH VERIO) TEST STRIP    TEST TWICE DAILY   GVOKE HYPOPEN 1-PACK 1 MG/0.2ML SOAJ    Inject into the skin.   HYDROCHLOROTHIAZIDE (HYDRODIURIL) 25 MG TABLET    Take 1 tablet (25 mg total) by mouth daily.   INSULIN DISPOSABLE PUMP (OMNIPOD 5 G6 PODS, GEN 5,) MISC    1 Device by Does not apply route every 3 (three) days.   INSULIN LISPRO (HUMALOG) 100 UNIT/ML INJECTION    INJECT SUBCUTANEOUSLY 50 UNITS  DAILY VIA INSULIN PUMP   INSULIN PEN NEEDLE (PEN NEEDLES) 32G X 4 MM MISC    Use as Directed 3 times daily to inject insulin   LANCETS (ONETOUCH DELICA PLUS LANCET33G) MISC    CHECK BLOOD SUGAR TWICE DAILY   LATANOPROST (XALATAN) 0.005 % OPHTHALMIC SOLUTION    Place 1 drop into both eyes at bedtime.   LEVOTHYROXINE (SYNTHROID) 25 MCG TABLET    Take 1 tablet (25 mcg total) by mouth daily before breakfast.   LINACLOTIDE (LINZESS) 145 MCG CAPS CAPSULE    TAKE 1  CAPSULE BY MOUTH DAILY  BEFORE BREAKFAST   MELOXICAM (MOBIC) 15 MG TABLET    Take 1 tablet (15 mg total) by mouth daily.   MELOXICAM (MOBIC) 7.5 MG TABLET    Take 7.5 mg by mouth daily.   METOPROLOL SUCCINATE (TOPROL-XL) 25 MG 24 HR TABLET    Take 1 tablet (25 mg total) by mouth daily.   MULTIPLE VITAMIN (MULTIVITAMIN WITH MINERALS) TABS TABLET    Take 1 tablet by mouth daily.   OMEGA-3 FATTY ACIDS (FISH OIL) 300 MG CAPS    Take 1 capsule (300 mg total) by mouth daily.   PIOGLITAZONE (ACTOS) 30 MG TABLET    Take 1 tablet (30 mg total) by mouth daily.   POLYETHYLENE GLYCOL POWDER (GLYCOLAX/MIRALAX) 17 GM/SCOOP POWDER    Take 17 g by mouth 2 (two) times daily as needed.   POTASSIUM CHLORIDE (MICRO-K) 10 MEQ CR CAPSULE    Take 10 mEq by mouth 2 (two) times daily.   SEMAGLUTIDE (RYBELSUS) 14 MG TABS    Take 1 tablet (14 mg total) by mouth daily.   TOUJEO SOLOSTAR 300 UNIT/ML SOLOSTAR PEN    Inject 35 Units into the skin daily.  Modified Medications   Modified Medication Previous Medication   TRIAMCINOLONE OINTMENT (KENALOG) 0.5 % triamcinolone ointment (KENALOG) 0.5 %      APPLY TO AFFECTED AREA(S)  TOPICALLY TWICE DAILY    APPLY TO AFFECTED AREA(S)  TOPICALLY TWICE DAILY  Discontinued Medications   No medications on file    Allergies Allergies  Allergen Reactions   Clindamycin/Lincomycin Swelling    Possible facial and lip swelling   Ace Inhibitors     Facial and lip swelling possibly associated    Past Medical History 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   Pacemaker    Medtronic Adapta ADDR01   Seizure disorder (HCC)    Seizures (HCC)    last time  in 1990's    Past Surgical History Past Surgical History:  Procedure Laterality Date   LACERATION REPAIR  1980    scalp   PACEMAKER LEAD REMOVAL N/A 03/23/2014   Procedure: PACEMAKER LEAD REMOVAL;  Surgeon: Marinus Maw, MD;  Location: [facility] OR;  Service: Cardiovascular;  Laterality: N/A;   PTDVP     7/08 medtronic DDD    Family History family history includes COPD in his mother; Cancer in his brother; Hypertension in his brother, brother, and mother.  Social History Social History   Socioeconomic History   Marital status: Legally Separated    Spouse name: Not on file   Number of children: 3   Years of education: Not on file   Highest education level: Not on file  Occupational History   Occupation: Music therapist, truck driver  Tobacco Use   Smoking status: Never   Smokeless tobacco: Never  Vaping Use   Vaping status: Never Used  Substance and Sexual Activity   Alcohol use: No    Comment: quit 20 yrs ago   Drug use: No   Sexual activity: Yes    Partners: Female  Other Topics Concern   Not on file  Social History Narrative   12th grade education. H/O substance abuse- now abstemious and works with other in recovery..Attends NA on a regular basis and serves as a sponsor. Married 18 years-divorced. Married 5 years 2nd marriage.3 daughters previous marriage, 9 grandchildren, 3 great-grands. Work- short Production assistant, radio            Social Determinants of Corporate investment banker Strain: Low Risk  (02/20/2020)   Overall Financial Resource Strain (CARDIA)    Difficulty of Paying Living Expenses: Not hard at all  Food Insecurity: No Food Insecurity (02/20/2020)   Hunger Vital Sign    Worried About Running Out of Food in the Last Year: Never true    Ran Out of Food in the Last Year: Never true  Transportation Needs: No Transportation Needs (02/20/2020)   PRAPARE - Administrator, Civil Service (Medical): No    Lack of Transportation (Non-Medical): No  Physical Activity: Sufficiently Active (02/20/2020)   Exercise Vital Sign    Days of Exercise per Week: 7 days    Minutes of  Exercise per Session: 150+ min  Stress: No Stress Concern Present (02/13/2022)   [employer] of Occupational Health - Occupational Stress Questionnaire    Feeling of Stress : Only a little  Social Connections: Moderately Integrated (02/20/2020)   Social Connection and Isolation Panel [NHANES]    Frequency of Communication with Friends and Family: More than three times a week    Frequency of Social Gatherings with Friends and Family: Once a week    Attends Religious Services: 1 to 4 times per year    Active Member of [employer] or Organizations: Yes    Attends Banker Meetings: 1 to 4 times per year    Marital Status: Divorced  Intimate Partner Violence: Not At Risk (02/20/2020)   Humiliation, Afraid, Rape, and Kick questionnaire    Fear of Current or Ex-Partner: No    Emotionally Abused: No    Physically Abused: No    Sexually Abused: No    Lab Results  Component Value Date   HGBA1C 6.7 (A) 03/31/2023   HGBA1C 7.2 (A) 12/09/2022   HGBA1C 9.5 (A) 09/02/2022   Lab Results  Component Value Date   CHOL 113 02/27/2022   Lab Results  Component Value Date   HDL 49.40 02/27/2022   Lab Results  Component Value Date   LDLCALC 50 02/27/2022   Lab Results  Component Value Date   TRIG 68.0 02/27/2022   Lab Results  Component Value Date   CHOLHDL 2 02/27/2022   Lab Results  Component Value Date   CREATININE 1.87 (H) 07/24/2022   Lab Results  Component Value Date   GFR 34.17 (L) 02/27/2022   Lab Results  Component Value Date   MICROALBUR 4.8 (H) 02/27/2022      Component Value Date/Time   NA 138 07/24/2022 0924   K 4.4 07/24/2022 0924   CL 101 07/24/2022 0924   CO2 23 07/24/2022 0924   GLUCOSE 183 (H) 08/31/2022 1125   BUN 19 07/24/2022 0924   CREATININE 1.87 (H) 07/24/2022 0924   CREATININE 1.63 (H) 05/02/2015 1106   CALCIUM 9.1 07/24/2022 0924   PROT 6.4 09/11/2021 1031   ALBUMIN 3.9 09/11/2021 1031   AST 40 (H) 09/11/2021 1031   ALT 27 09/11/2021  1031   ALKPHOS 78 09/11/2021 1031   BILITOT 0.9 09/11/2021 1031   GFRNONAA 45 (L) 07/14/2020 1823   GFRAA 54 (L) 11/29/2018 1511      Latest Ref Rng & Units 08/31/2022   11:25 AM 07/24/2022    9:24 AM 02/27/2022    9:59 AM  BMP  Glucose 70 - 99 mg/dL 161  096  045   BUN 8 - 27 mg/dL  19  18   Creatinine 4.09 - 1.27 mg/dL  8.11  9.14   BUN/Creat Ratio 10 - 24  10    Sodium 134 - 144 mmol/L  138  141   Potassium 3.5 - 5.2 mmol/L  4.4  4.0   Chloride 96 - 106 mmol/L  101  105   CO2 20 - 29 mmol/L  23  29   Calcium 8.6 - 10.2 mg/dL  9.1  9.0        Component Value Date/Time   WBC 5.8 09/11/2021 1031   RBC 4.27 09/11/2021 1031   HGB 12.6 (L) 09/11/2021 1031   HCT 38.7 (L) 09/11/2021 1031   PLT 132.0 (L) 09/11/2021 1031   MCV 90.4 09/11/2021 1031   MCH 29.4 07/14/2020 1823   MCHC 32.7 09/11/2021 1031   RDW 13.5 09/11/2021 1031   LYMPHSABS 1.4 11/29/2018 1511   MONOABS 0.6 11/29/2018 1511   EOSABS 0.0 11/29/2018 1511   BASOSABS 0.0 11/29/2018 1511     Parts of this note may have been dictated using voice recognition software. There may be variances in spelling and vocabulary which are unintentional. Not all errors are proofread. Please notify the Thereasa Parkin if any discrepancies are noted or if the meaning of any statement is not clear.

## 2023-04-05 ENCOUNTER — Encounter: Payer: Self-pay | Admitting: "Endocrinology

## 2023-04-05 ENCOUNTER — Telehealth: Payer: Self-pay

## 2023-04-05 DIAGNOSIS — T383X5A Adverse effect of insulin and oral hypoglycemic [antidiabetic] drugs, initial encounter: Secondary | ICD-10-CM

## 2023-04-05 NOTE — Telephone Encounter (Signed)
Michael Snyder came in the office this afternoon stating that at his last office visit with Dr Roosevelt Locks on 03/31/23 she changed his Humalog to 5 units prior to each meal three times a day and he stated he thought that was to much, his blood sugar dropped Saturday morning to 26 and EMS had to come out and check on him tried to get him to go to the ER but he said once they got his blood sugar back up some he started to feel better and he has currently cut the humalog back to 3 units daily prior to meals and he states that seems to be where he needs to stay at this time I have printed off his pump readings and scanned them into EPIC under Media

## 2023-04-06 MED ORDER — GVOKE HYPOPEN 1-PACK 1 MG/0.2ML ~~LOC~~ SOAJ: 1.0000 mg | SUBCUTANEOUS | 2 refills | Status: AC | PRN

## 2023-04-06 NOTE — Telephone Encounter (Signed)
Patient reported he had a low blood sugar.  Called patient back to get the details.  Patient reported that he took 5 units of bolus via his pump around 8 at night, 5 minutes before dinner.  At 3 PM patient had a low blood sugar.  Explained to the patient that the episode does not seem to be related to his bolus as a bolus is not expected to last that long.  The patient was asked to stop Toujeo 6 units that he was previously taking at night, despite that patient had a low.  Patient has cut down his bolus from 5 units back to 3 units 3 times daily AC.  If patient continues to have low blood sugar, we will change his overnight basal rate.  Patient is in auto mode and it is possible that because of his high blood sugars, the auto mode basal rate adjusted and gave him higher insulin.  The patient understands and agreed to the plan.  Call duration 6 minutes

## 2023-04-06 NOTE — Addendum Note (Signed)
Addended by: Altamese Gosper on: 04/06/2023 10:38 AM   Modules accepted: Orders

## 2023-04-12 ENCOUNTER — Encounter: Payer: Self-pay | Admitting: "Endocrinology

## 2023-04-20 ENCOUNTER — Encounter: Payer: Self-pay | Admitting: Internal Medicine

## 2023-04-20 ENCOUNTER — Ambulatory Visit: Payer: Medicare Other | Admitting: Internal Medicine

## 2023-04-20 VITALS — BP 140/80 | HR 86 | Temp 98.2°F | Ht 72.0 in | Wt 178.0 lb

## 2023-04-20 DIAGNOSIS — E118 Type 2 diabetes mellitus with unspecified complications: Secondary | ICD-10-CM | POA: Diagnosis not present

## 2023-04-20 DIAGNOSIS — E039 Hypothyroidism, unspecified: Secondary | ICD-10-CM

## 2023-04-20 DIAGNOSIS — K5900 Constipation, unspecified: Secondary | ICD-10-CM

## 2023-04-20 DIAGNOSIS — N1832 Chronic kidney disease, stage 3b: Secondary | ICD-10-CM

## 2023-04-20 DIAGNOSIS — Z794 Long term (current) use of insulin: Secondary | ICD-10-CM

## 2023-04-20 DIAGNOSIS — Z0000 Encounter for general adult medical examination without abnormal findings: Secondary | ICD-10-CM

## 2023-04-20 DIAGNOSIS — I480 Paroxysmal atrial fibrillation: Secondary | ICD-10-CM | POA: Diagnosis not present

## 2023-04-20 DIAGNOSIS — Z23 Encounter for immunization: Secondary | ICD-10-CM | POA: Diagnosis not present

## 2023-04-20 DIAGNOSIS — I10 Essential (primary) hypertension: Secondary | ICD-10-CM | POA: Diagnosis not present

## 2023-04-20 MED ORDER — LINACLOTIDE 145 MCG PO CAPS: 145.0000 ug | ORAL_CAPSULE | Freq: Every day | ORAL | 3 refills | Status: AC

## 2023-04-20 MED ORDER — ONETOUCH DELICA PLUS LANCET33G MISC
2 refills | Status: DC
Start: 2023-04-20 — End: 2023-07-22

## 2023-04-20 MED ORDER — ONETOUCH VERIO VI STRP: ORAL_STRIP | 0 refills | Status: AC

## 2023-04-20 MED ORDER — ATORVASTATIN CALCIUM 20 MG PO TABS
20.0000 mg | ORAL_TABLET | Freq: Every day | ORAL | 3 refills | Status: DC
Start: 2023-04-20 — End: 2024-03-03

## 2023-04-20 NOTE — Assessment & Plan Note (Signed)
He is taking aspirin and stopped xarelto after episode of dark urine back in 2017. He is on metoprolol for rate control. Sounds regular today EKG not done.

## 2023-04-20 NOTE — Assessment & Plan Note (Signed)
Taking linzess 145 mcg daily while is helping continue.

## 2023-04-20 NOTE — Assessment & Plan Note (Signed)
Taking metoprolol 25 mg daily and hydrochlorothiazide 25 mg daily and amlodipine 2.5 mg daily. Checking CMP and adjust as needed.

## 2023-04-20 NOTE — Assessment & Plan Note (Signed)
Checking TSH and adjust levothyroxine 25 mcg daily as needed.

## 2023-04-20 NOTE — Progress Notes (Unsigned)
Subjective:   Patient ID: Michael Snyder, male    DOB: 1951-02-17, 72 y.o.   MRN: [id number]  HPI Here for medicare wellness and physical. no new complaints. Please see A/P for status and treatment of chronic medical problems.   Diet: DM since diabetic Physical activity: active Depression/mood screen: negative Hearing: intact to whispered voice Visual acuity: grossly normal with lens, performs annual eye exam  ADLs: capable Fall risk: none Home safety: good Cognitive evaluation: intact to orientation, naming, recall and repetition EOL planning: adv directives discussed, has living will  [facility] Coordination from 02/13/2022 in [facility] Coordination  PHQ-2 Total Score 2       Flowsheet Row Care Coordination from 02/13/2022 in [facility] Care Coordination  PHQ-9 Total Score 5         02/20/2020    8:54 AM 07/14/2020    1:39 PM 08/19/2020   10:26 AM 03/13/2021    9:03 AM 04/20/2023    3:44 PM  Fall Risk  Falls in the past year? 0  0 0 0  Was there an injury with Fall? 0   0 0  Fall Risk Category Calculator 0   0 0  Fall Risk Category (Retired) Low   Low   (RETIRED) Patient Fall Risk Level Low fall risk Low fall risk  Low fall risk   Patient at Risk for Falls Due to No Fall Risks   No Fall Risks   Fall risk Follow up Falls evaluation completed;Education provided   Falls evaluation completed Falls evaluation completed    I have personally reviewed and have noted 1. The patient's medical and social history - reviewed today no changes 2. Their use of alcohol, tobacco or illicit drugs 3. Their current medications and supplements 4. The patient's functional ability including ADL's, fall risks, home safety risks and hearing or visual impairment. 5. Diet and physical activities 6. Evidence for depression or mood disorders 7. Care team reviewed and updated 8.  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   Pacemaker    Medtronic Adapta ADDR01   Seizure disorder (HCC)    Seizures (HCC)    last time in 1990's   Past Surgical History:  Procedure Laterality Date   LACERATION REPAIR  1980   scalp   PACEMAKER LEAD REMOVAL N/A 03/23/2014   Procedure: PACEMAKER LEAD REMOVAL;  Surgeon: Marinus Maw, MD;  Location: [facility] OR;  Service: Cardiovascular;  Laterality: N/A;   PTDVP     7/08 medtronic DDD   Family History  Problem Relation Age of Onset   Hypertension Mother    COPD Mother    Hypertension Brother    Cancer Brother        leukemia   Hypertension Brother    Diabetes Neg Hx    Review of Systems  Constitutional: Negative.   HENT: Negative.    Eyes: Negative.   Respiratory:  Negative for cough, chest tightness  and shortness of breath.   Cardiovascular:  Negative for chest pain, palpitations and leg swelling.  Gastrointestinal:  Negative for abdominal distention, abdominal pain, constipation, diarrhea, nausea and vomiting.  Musculoskeletal: Negative.   Skin: Negative.   Neurological: Negative.   Psychiatric/Behavioral: Negative.      Objective:  Physical Exam Constitutional:      Appearance: He is well-developed.  HENT:     Head: Normocephalic and atraumatic.  Cardiovascular:     Rate and Rhythm: Normal rate and regular rhythm.  Pulmonary:     Effort: Pulmonary effort is normal. No respiratory distress.     Breath sounds: Normal breath sounds. No wheezing or rales.  Abdominal:     General: Bowel sounds are normal. There is no distension.      Palpations: Abdomen is soft.     Tenderness: There is no abdominal tenderness. There is no rebound.  Musculoskeletal:     Cervical back: Normal range of motion.  Skin:    General: Skin is warm and dry.  Neurological:     Mental Status: He is alert and oriented to person, place, and time.     Coordination: Coordination normal.     Vitals:   04/20/23 1539 04/20/23 1544  BP: (!) 140/80 (!) 140/80  Pulse: 86   Temp: 98.2 F (36.8 C)   TempSrc: Oral   SpO2: 98%   Weight: 178 lb (80.7 kg)   Height: 6' (1.829 m)     Assessment & Plan:  Flu shot given at visit

## 2023-04-20 NOTE — Assessment & Plan Note (Signed)
Checking CMP and adjust as needed. He does have htn and diabetes both well controlled.

## 2023-04-20 NOTE — Assessment & Plan Note (Signed)
Taking farxiga 10 mg daily and actos 30 mg daily and toujeo 6 units daily and on insulin pump and rybelsus 14 mg daily. Seeing endo and last HgA1c at goal. 1 recent episode low sugar and he felt his endocrinologist was not responsive to his concerns and would like to switch. Checking microalbumin to creatinine ratio and lipid panel and CMP. Referral done to another group.

## 2023-04-20 NOTE — Assessment & Plan Note (Signed)
Flu shot given. Pneumonia complete. Shingrix due at pharmacy. Tetanus due 2029. Colonoscopy due 2027. Counseled about sun safety and mole surveillance. Counseled about the dangers of distracted driving. Given 10 year screening recommendations.

## 2023-04-20 NOTE — Patient Instructions (Signed)
We will get you in with a different sugar doctor to help.

## 2023-04-21 LAB — COMPREHENSIVE METABOLIC PANEL
ALT: 17 U/L (ref 0–53)
AST: 31 U/L (ref 0–37)
Albumin: 4.2 g/dL (ref 3.5–5.2)
Alkaline Phosphatase: 70 U/L (ref 39–117)
BUN: 19 mg/dL (ref 6–23)
CO2: 29 meq/L (ref 19–32)
Calcium: 9.6 mg/dL (ref 8.4–10.5)
Chloride: 104 meq/L (ref 96–112)
Creatinine, Ser: 1.75 mg/dL — ABNORMAL HIGH (ref 0.40–1.50)
GFR: 38.12 mL/min — ABNORMAL LOW (ref >60.00)
Glucose, Bld: 106 mg/dL — ABNORMAL HIGH (ref 70–99)
Potassium: 3.9 meq/L (ref 3.5–5.1)
Sodium: 139 meq/L (ref 135–145)
Total Bilirubin: 0.7 mg/dL (ref 0.2–1.2)
Total Protein: 7.2 g/dL (ref 6.0–8.3)

## 2023-04-21 LAB — LIPID PANEL
Cholesterol: 150 mg/dL (ref 0–200)
HDL: 44.3 mg/dL (ref >39.00)
LDL Cholesterol: 81 mg/dL (ref 0–99)
NonHDL: 106.18
Total CHOL/HDL Ratio: 3
Triglycerides: 126 mg/dL (ref 0.0–149.0)
VLDL: 25.2 mg/dL (ref 0.0–40.0)

## 2023-04-21 LAB — CBC
HCT: 41 % (ref 39.0–52.0)
Hemoglobin: 13.3 g/dL (ref 13.0–17.0)
MCHC: 32.5 g/dL (ref 30.0–36.0)
MCV: 91.5 fL (ref 78.0–100.0)
Platelets: 153 10*3/uL (ref 150.0–400.0)
RBC: 4.48 Mil/uL (ref 4.22–5.81)
RDW: 14.1 % (ref 11.5–15.5)
WBC: 6.2 10*3/uL (ref 4.0–10.5)

## 2023-04-21 LAB — MICROALBUMIN / CREATININE URINE RATIO
Creatinine,U: 62.5 mg/dL
Microalb Creat Ratio: 5.7 mg/g (ref 0.0–30.0)
Microalb, Ur: 3.6 mg/dL — ABNORMAL HIGH (ref 0.0–1.9)

## 2023-04-21 LAB — TSH: TSH: 2.3 u[IU]/mL (ref 0.35–5.50)

## 2023-05-03 ENCOUNTER — Other Ambulatory Visit: Payer: Self-pay

## 2023-05-03 DIAGNOSIS — E876 Hypokalemia: Secondary | ICD-10-CM

## 2023-05-03 MED ORDER — METOPROLOL SUCCINATE ER 25 MG PO TB24: 25.0000 mg | ORAL_TABLET | Freq: Every day | ORAL | 0 refills | Status: DC

## 2023-05-18 ENCOUNTER — Other Ambulatory Visit: Payer: Self-pay

## 2023-05-18 DIAGNOSIS — E118 Type 2 diabetes mellitus with unspecified complications: Secondary | ICD-10-CM

## 2023-05-18 MED ORDER — TOUJEO SOLOSTAR 300 UNIT/ML ~~LOC~~ SOPN: 35.0000 [IU] | PEN_INJECTOR | Freq: Every day | SUBCUTANEOUS | 3 refills | Status: DC

## 2023-06-17 ENCOUNTER — Other Ambulatory Visit: Payer: Self-pay

## 2023-06-17 DIAGNOSIS — E1165 Type 2 diabetes mellitus with hyperglycemia: Secondary | ICD-10-CM

## 2023-06-17 DIAGNOSIS — E7800 Pure hypercholesterolemia, unspecified: Secondary | ICD-10-CM

## 2023-06-18 ENCOUNTER — Other Ambulatory Visit: Payer: Self-pay

## 2023-06-18 ENCOUNTER — Telehealth: Payer: Self-pay | Admitting: Internal Medicine

## 2023-06-18 ENCOUNTER — Telehealth: Payer: Self-pay

## 2023-06-18 DIAGNOSIS — E1165 Type 2 diabetes mellitus with hyperglycemia: Secondary | ICD-10-CM

## 2023-06-18 MED ORDER — LEVOTHYROXINE SODIUM 25 MCG PO TABS: 25.0000 ug | ORAL_TABLET | Freq: Every day | ORAL | 0 refills | Status: DC

## 2023-06-18 MED ORDER — LEVOTHYROXINE SODIUM 25 MCG PO TABS: 25.0000 ug | ORAL_TABLET | Freq: Every day | ORAL | 3 refills | Status: DC

## 2023-06-18 NOTE — Telephone Encounter (Signed)
Received patient's patient assistance meds. Received Rybelsus. Patient called and made aware.

## 2023-06-18 NOTE — Telephone Encounter (Signed)
Prescription Request  06/18/2023  LOV: 04/20/2023  What is the name of the medication or equipment? levothyroxine (SYNTHROID) 25 MCG tablet   Have you contacted your pharmacy to request a refill? No   Which pharmacy would you like this sent to?    [facility] [address] ([address] [place] - [address] [place] ([place]) [place] [address] Phone: [phone] Fax: [phone]  Please advise at Mobile [phone] (mobile)

## 2023-06-24 ENCOUNTER — Other Ambulatory Visit: Payer: Medicare Other

## 2023-06-24 DIAGNOSIS — E1165 Type 2 diabetes mellitus with hyperglycemia: Secondary | ICD-10-CM | POA: Diagnosis not present

## 2023-06-24 DIAGNOSIS — Z794 Long term (current) use of insulin: Secondary | ICD-10-CM | POA: Diagnosis not present

## 2023-06-24 DIAGNOSIS — E7800 Pure hypercholesterolemia, unspecified: Secondary | ICD-10-CM | POA: Diagnosis not present

## 2023-06-25 LAB — COMPREHENSIVE METABOLIC PANEL
AG Ratio: 1.4 (calc) (ref 1.0–2.5)
ALT: 23 U/L (ref 9–46)
AST: 35 U/L (ref 10–35)
Albumin: 3.9 g/dL (ref 3.6–5.1)
Alkaline phosphatase (APISO): 76 U/L (ref 35–144)
BUN/Creatinine Ratio: 11 (calc) (ref 6–22)
BUN: 20 mg/dL (ref 7–25)
CO2: 21 mmol/L (ref 20–32)
Calcium: 8.9 mg/dL (ref 8.6–10.3)
Chloride: 106 mmol/L (ref 98–110)
Creat: 1.88 mg/dL — ABNORMAL HIGH (ref 0.70–1.28)
Globulin: 2.7 g/dL (ref 1.9–3.7)
Glucose, Bld: 211 mg/dL — ABNORMAL HIGH (ref 65–139)
Potassium: 4.2 mmol/L (ref 3.5–5.3)
Sodium: 140 mmol/L (ref 135–146)
Total Bilirubin: 0.9 mg/dL (ref 0.2–1.2)
Total Protein: 6.6 g/dL (ref 6.1–8.1)

## 2023-06-25 LAB — LIPID PANEL
Cholesterol: 104 mg/dL (ref <200)
HDL: 37 mg/dL — ABNORMAL LOW (ref >=40)
LDL Cholesterol (Calc): 48 mg/dL
Non-HDL Cholesterol (Calc): 67 mg/dL (ref <130)
Total CHOL/HDL Ratio: 2.8 (calc) (ref <5.0)
Triglycerides: 100 mg/dL (ref <150)

## 2023-06-25 LAB — HEMOGLOBIN A1C
Hgb A1c MFr Bld: 7.5 %{Hb} — ABNORMAL HIGH (ref <5.7)
Mean Plasma Glucose: 169 mg/dL
eAG (mmol/L): 9.3 mmol/L

## 2023-06-27 ENCOUNTER — Other Ambulatory Visit: Payer: Self-pay | Admitting: Internal Medicine

## 2023-06-27 DIAGNOSIS — E876 Hypokalemia: Secondary | ICD-10-CM

## 2023-07-01 ENCOUNTER — Ambulatory Visit (INDEPENDENT_AMBULATORY_CARE_PROVIDER_SITE_OTHER): Payer: Medicare Other | Admitting: "Endocrinology

## 2023-07-01 ENCOUNTER — Encounter: Payer: Self-pay | Admitting: "Endocrinology

## 2023-07-01 VITALS — BP 132/80 | HR 90 | Resp 20 | Ht 72.0 in | Wt 184.0 lb

## 2023-07-01 DIAGNOSIS — Z9641 Presence of insulin pump (external) (internal): Secondary | ICD-10-CM | POA: Diagnosis not present

## 2023-07-01 DIAGNOSIS — Z7984 Long term (current) use of oral hypoglycemic drugs: Secondary | ICD-10-CM

## 2023-07-01 DIAGNOSIS — E7800 Pure hypercholesterolemia, unspecified: Secondary | ICD-10-CM

## 2023-07-01 DIAGNOSIS — E1165 Type 2 diabetes mellitus with hyperglycemia: Secondary | ICD-10-CM

## 2023-07-01 DIAGNOSIS — Z794 Long term (current) use of insulin: Secondary | ICD-10-CM | POA: Diagnosis not present

## 2023-07-01 NOTE — Progress Notes (Signed)
Outpatient Endocrinology Note [place], MD  07/01/23   Aerick Simek [birth year] [id number]  Referring Provider: No ref. provider found Primary Care Provider: Myrlene Broker, MD Reason for consultation: Subjective   Assessment & Plan  Diagnoses and all orders for this visit:  Uncontrolled type 2 diabetes mellitus with hyperglycemia, with long-term current use of insulin (HCC) -     Ambulatory referral to diabetic education  Insulin pump in place  Long term (current) use of oral hypoglycemic drugs  Hypercholesterolemia    Diabetes complicated by hyperglycemia. Hba1c goal less than 7.0, current Hba1c is 7.5. Will recommend the following: Pioglitazone 30 mg every day Rybelsus 14 mg qd Stop toujeo 6 units at bedtime (only take if insulin pump fails)  Patient takes fixed boluses for meals  Take 3 unit bolus for break fast, and 4 units with lunch and 4 units with supper  Pt doesn't count carbs   Continue OMNIPOD insulin pump settings on AUTOMODE with DexCom: Basal rate = 0.3 units an hour all 24 hours Carb ratio 1:1 Sensitivity 1: 50 Target 120 and correction threshold 120  Non-insulin hypoglycemic drugs: Farxiga 10 mg qd, Rybelsus 14 mg qd and Actos 30 mg qd  No known contraindications to any of above medications No history of heart attack/heart failure/pedal edema/urinary bladder cancer   Glucagon - has it, discussed its use previously   Hyperlipidemia -Last LDL at goal: 50 -on atorvastatin 20 mg QD -Follow low fat diet and exercise    -Blood pressure goal <140/90 - Microalbumin/creatinine at goal < 30 -not on ACE/ARB  -diet changes including salt restriction -limit eating outside -counseled BP targets per standards of diabetes care -Uncontrolled blood pressure can lead to retinopathy, nephropathy and cardiovascular and atherosclerotic heart disease  Reviewed and counseled on: -A1C target -Blood sugar targets -Complications of  uncontrolled diabetes  -Checking blood sugar before meals and bedtime and bring log next visit -All medications with mechanism of action and side effects -Hypoglycemia management: rule of 15's, Glucagon Emergency Kit and medical alert ID -low-carb low-fat plate-method diet -At least 20 minutes of physical activity per day -Annual dilated retinal eye exam and foot exam -compliance and follow up needs -follow up as scheduled or earlier if problem gets worse  Call if blood sugar is less than 70 or consistently above 250    Take a 15 gm snack of carbohydrate at bedtime before you go to sleep if your blood sugar is less than 100.    If you are going to fast after midnight for a test or procedure, ask your physician for instructions on how to reduce/decrease your insulin dose.    Call if blood sugar is less than 70 or consistently above 250  -Treating a low sugar by rule of 15  (15 gms of sugar every 15 min until sugar is more than 70) If you feel your sugar is low, test your sugar to be sure If your sugar is low (less than 70), then take 15 grams of a fast acting Carbohydrate (3-4 glucose tablets or glucose gel or 4 ounces of juice or regular soda) Recheck your sugar 15 min after treating low to make sure it is more than 70 If sugar is still less than 70, treat again with 15 grams of carbohydrate          Don't drive the hour of hypoglycemia  If unconscious/unable to eat or drink by mouth, use glucagon injection or nasal spray baqsimi and call  911. Can repeat again in 15 min if still unconscious.  Return in about 4 weeks (around 07/29/2023).   I have reviewed current medications, nurse's notes, allergies, vital signs, past medical and surgical history, family medical history, and social history for this encounter. Counseled patient on symptoms, examination findings, lab findings, imaging results, treatment decisions and monitoring and prognosis. The patient understood the recommendations and  agrees with the treatment plan. All questions regarding treatment plan were fully answered.  Michael [place], MD  07/01/23    History of Present Illness Kannan Pamintuan is a 72 y.o. year old male who presents for follow up of Type 2 diabetes mellitus.  Hannah Westland was first diagnosed in 2008.   Diabetes education +  Home diabetes regimen: Pioglitazone 30 mg every day Rybelsus 14 mg qd Toujeo 4-5 units at bedtime  OMNIPOD insulin pump settings currently with DexCom: Basal rate = 0.3 units an hour all 24 hours Carb ratio 1:1 Sensitivity 1: 50 Target 120 and correction threshold 120 Pt doesn't count carbs: takes fixed bolus of 3 units  Non-insulin hypoglycemic drugs:  Farxiga 10 mg qd, Rybelsus 14 mg qd and Actos 30 mg qd  Previous history: Oral hypoglycemic drugs previously used are: Glipizide, Actos, metformin Insulin was started in 2020 with Tresiba, subsequently Lantus and Humalog as well as 75/25 Lantus 5 units in the evening and Humalog 5-7 units before meals  Recent history:     COMPLICATIONS -  MI/Stroke -  retinopathy -  neuropathy +  nephropathy  BLOOD SUGAR DATA  CGM interpretation: At today's visit, we reviewed her CGM downloads. The full report is scanned in the media. Reviewing the CGM trends, elevated blood sugars around lunch and super time, and low overnight.   Physical Exam  BP 132/80 (BP Location: Left Arm, Patient Position: Sitting, Cuff Size: Normal)   Pulse 90   Resp 20   Ht 6' (1.829 m)   Wt 184 lb (83.5 kg)   SpO2 96%   BMI 24.95 kg/m    Constitutional: well developed, well nourished Head: normocephalic, atraumatic Eyes: sclera anicteric, no redness Neck: supple Lungs: normal respiratory effort Neurology: alert and oriented Skin: dry, no appreciable rashes Musculoskeletal: no appreciable defects Psychiatric: normal mood and affect Diabetic Foot Exam - Simple   No data filed      Current Medications Patient's Medications  New  Prescriptions   No medications on file  Previous Medications   ALCOHOL SWABS (EASY TOUCH ALCOHOL PREP MEDIUM) 70 % PADS    Apply 1 each topically as directed.   AMLODIPINE (NORVASC) 2.5 MG TABLET    Take 2.5 mg by mouth daily.   ASPIRIN EC 81 MG TABLET    Take 81 mg daily by mouth.   ATORVASTATIN (LIPITOR) 20 MG TABLET    Take 1 tablet (20 mg total) by mouth daily.   BLOOD PRESSURE MONITORING (BLOOD PRESSURE KIT) DEVI    1 kit by Does not apply route daily.   BRIMONIDINE (ALPHAGAN) 0.2 % OPHTHALMIC SOLUTION    SMARTSIG:In Eye(s)   CALCIUM CARBONATE-VITAMIN D (CALTRATE 600+D) 600-400 MG-UNIT PER TABLET    Take 1 tablet by mouth 2 (two) times daily.   CHOLECALCIFEROL (VITAMIN D) 50 MCG (2000 UT) TABLET    Take 2,000 Units by mouth daily.   CONTINUOUS GLUCOSE RECEIVER (DEXCOM G6 RECEIVER) DEVI    USE AS DIRECTED TO CHECK BLOOD  SUGAR   CONTINUOUS GLUCOSE SENSOR (DEXCOM G6 SENSOR) MISC    CHANGE EVERY 10 DAYS  CONTINUOUS GLUCOSE TRANSMITTER (DEXCOM G6 TRANSMITTER) MISC    CHANGE EVERY 3 MONTHS   DAPAGLIFLOZIN PROPANEDIOL (FARXIGA) 10 MG TABS TABLET    Take 1 tablet (10 mg total) by mouth daily.   GLUCAGON (GVOKE HYPOPEN 1-PACK) 1 MG/0.2ML SOAJ    Inject 1 mg into the skin as needed (low blood sugar with impaired consciousness).   GLUCOSE BLOOD (ONETOUCH VERIO) TEST STRIP    TEST TWICE DAILY   GVOKE HYPOPEN 1-PACK 1 MG/0.2ML SOAJ    Inject into the skin.   HYDROCHLOROTHIAZIDE (HYDRODIURIL) 25 MG TABLET    Take 1 tablet (25 mg total) by mouth daily.   INSULIN DISPOSABLE PUMP (OMNIPOD 5 G6 PODS, GEN 5,) MISC    1 Device by Does not apply route every 3 (three) days.   INSULIN LISPRO (HUMALOG) 100 UNIT/ML INJECTION    INJECT SUBCUTANEOUSLY 50 UNITS  DAILY VIA INSULIN PUMP   INSULIN PEN NEEDLE (PEN NEEDLES) 32G X 4 MM MISC    Use as Directed 3 times daily to inject insulin   LANCETS (ONETOUCH DELICA PLUS LANCET33G) MISC    CHECK BLOOD SUGAR TWICE  DAILY   LATANOPROST (XALATAN) 0.005 % OPHTHALMIC  SOLUTION    Place 1 drop into both eyes at bedtime.   LEVOTHYROXINE (SYNTHROID) 25 MCG TABLET    Take 1 tablet (25 mcg total) by mouth daily before breakfast.   LINACLOTIDE (LINZESS) 145 MCG CAPS CAPSULE    Take 1 capsule (145 mcg total) by mouth daily before breakfast.   METOPROLOL SUCCINATE (TOPROL-XL) 25 MG 24 HR TABLET    TAKE 1 TABLET BY MOUTH DAILY   MULTIPLE VITAMIN (MULTIVITAMIN WITH MINERALS) TABS TABLET    Take 1 tablet by mouth daily.   OMEGA-3 FATTY ACIDS (FISH OIL) 300 MG CAPS    Take 1 capsule (300 mg total) by mouth daily.   PIOGLITAZONE (ACTOS) 30 MG TABLET    Take 1 tablet (30 mg total) by mouth daily.   POLYETHYLENE GLYCOL POWDER (GLYCOLAX/MIRALAX) 17 GM/SCOOP POWDER    Take 17 g by mouth 2 (two) times daily as needed.   POTASSIUM CHLORIDE (MICRO-K) 10 MEQ CR CAPSULE    Take 10 mEq by mouth 2 (two) times daily.   SEMAGLUTIDE (RYBELSUS) 14 MG TABS    Take 1 tablet (14 mg total) by mouth daily.   TOUJEO SOLOSTAR 300 UNIT/ML SOLOSTAR PEN    Inject 35 Units into the skin daily.   TRIAMCINOLONE OINTMENT (KENALOG) 0.5 %    APPLY TO AFFECTED AREA(S)  TOPICALLY TWICE DAILY  Modified Medications   No medications on file  Discontinued Medications   No medications on file    Allergies Allergies  Allergen Reactions   Clindamycin/Lincomycin Swelling    Possible facial and lip swelling   Ace Inhibitors     Facial and lip swelling possibly associated    Past Medical History 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  Social History Social History   Socioeconomic History   Marital status: Legally Separated    Spouse name: Not on file   Number of children: 3   Years of education: Not on file   Highest education level: Not on file  Occupational History   Occupation: Music therapist, truck driver  Tobacco Use   Smoking status: Never   Smokeless tobacco: Never  Vaping Use   Vaping status: Never Used  Substance and Sexual Activity   Alcohol use: No    Comment: quit 20 yrs ago   Drug use: No   Sexual activity: Yes    Partners: Female  Other Topics Concern   Not on file  Social History Narrative   12th grade education. H/O substance abuse- now abstemious and works with other in recovery..Attends NA on a regular basis and serves as a sponsor. Married 18 years-divorced. Married 5 years 2nd marriage.3 daughters previous marriage, 9 grandchildren, 3 great-grands. Work- Arts administrator            Social Drivers of Corporate investment banker Strain: Low Risk  (02/20/2020)   Overall Financial Resource Strain (CARDIA)    Difficulty of Paying Living Expenses: Not hard at all  Food Insecurity: No Food Insecurity (02/20/2020)   Hunger Vital Sign    Worried About Running Out of Food in the Last Year: Never true    Ran Out of Food in the Last Year: Never true  Transportation Needs: No Transportation Needs (02/20/2020)   PRAPARE - Administrator, Civil Service (Medical): No    Lack of Transportation (Non-Medical): No  Physical Activity: Sufficiently Active (02/20/2020)   Exercise Vital Sign    Days of Exercise per Week: 7 days    Minutes of Exercise per Session: 150+ min  Stress: No  Stress Concern Present (02/13/2022)   [employer] of Occupational Health - Occupational Stress Questionnaire    Feeling of Stress : Only a little  Social Connections: Moderately Integrated (02/20/2020)   Social Connection and Isolation Panel [NHANES]    Frequency of Communication with Friends and Family: More than three times a week    Frequency of Social Gatherings with Friends and Family: Once a week    Attends Religious Services: 1 to 4 times per year    Active Member of [employer] or Organizations: Yes    Attends Banker Meetings: 1 to 4 times per year    Marital Status: Divorced  Intimate Partner Violence: Not At Risk (02/20/2020)   Humiliation, Afraid, Rape, and Kick questionnaire    Fear of Current or Ex-Partner: No    Emotionally Abused: No    Physically Abused: No    Sexually Abused: No    Lab Results  Component Value Date   HGBA1C 7.5 (H) 06/24/2023   HGBA1C 6.7 (A) 03/31/2023   HGBA1C 7.2 (A) 12/09/2022   Lab Results  Component Value Date   CHOL 104 06/24/2023   Lab Results  Component Value Date   HDL 37 (L) 06/24/2023   Lab Results  Component Value Date   LDLCALC 48 06/24/2023   Lab Results  Component Value Date   TRIG 100 06/24/2023   Lab Results  Component Value Date   CHOLHDL 2.8 06/24/2023   Lab Results  Component Value Date   CREATININE 1.88 (H) 06/24/2023   Lab Results  Component Value Date   GFR 38.12 (L) 04/20/2023   Lab Results  Component Value Date   MICROALBUR 3.6 (H) 04/20/2023      Component Value Date/Time   NA 140 06/24/2023 0833   NA 138 07/24/2022 0924   K 4.2 06/24/2023 0833   CL 106 06/24/2023 0833   CO2 21 06/24/2023 0833   GLUCOSE 211 (H) 06/24/2023 0833   BUN 20 06/24/2023 0833   BUN 19 07/24/2022 0924   CREATININE 1.88 (H) 06/24/2023 0833   CALCIUM 8.9 06/24/2023 0833   PROT 6.6 06/24/2023 0833   ALBUMIN 4.2 04/20/2023 1627   AST 35 06/24/2023 0833   ALT 23 06/24/2023 0833   ALKPHOS 70 04/20/2023  1627   BILITOT 0.9 06/24/2023 0833   GFRNONAA 45 (L) 07/14/2020 1823   GFRAA 54 (L) 11/29/2018 1511      Latest Ref Rng & Units 06/24/2023    8:33 AM 04/20/2023    4:27 PM 08/31/2022   11:25 AM  BMP  Glucose 65 - 139 mg/dL 409  811  914   BUN 7 - 25 mg/dL 20  19    Creatinine 7.82 - 1.28 mg/dL 9.56  2.13    BUN/Creat Ratio 6 - 22 (calc) 11     Sodium 135 - 146 mmol/L 140  139    Potassium 3.5 - 5.3 mmol/L 4.2  3.9    Chloride 98 - 110 mmol/L 106  104    CO2 20 - 32 mmol/L 21  29    Calcium 8.6 - 10.3 mg/dL 8.9  9.6         Component Value Date/Time   WBC 6.2 04/20/2023 1627   RBC 4.48 04/20/2023 1627   HGB 13.3 04/20/2023 1627   HCT 41.0 04/20/2023 1627   PLT 153.0 04/20/2023 1627   MCV 91.5 04/20/2023 1627   MCH 29.4 07/14/2020 1823   MCHC 32.5 04/20/2023 1627   RDW 14.1 04/20/2023 1627   LYMPHSABS 1.4 11/29/2018 1511   MONOABS 0.6 11/29/2018 1511   EOSABS 0.0 11/29/2018 1511   BASOSABS 0.0 11/29/2018 1511     Parts of this note may have been dictated using voice recognition software. There may be variances in spelling and vocabulary which are unintentional. Not all errors are proofread. Please notify the Thereasa Parkin if any discrepancies are noted or if the meaning of any statement is not clear.

## 2023-07-01 NOTE — Patient Instructions (Addendum)
Stop toujeo 6 units at bedtime (only take if insulin pump fails)  Take 3 unit bolus for break fast, and 4 units with lunch and 4 units with supper  Continue rest the same  Goals of DM therapy:  Morning Fasting blood sugar: 80-140  Blood sugar before meals: 80-140 Bed time blood sugar: 100-150  A1C <7%, limited only by hypoglycemia  1.Diabetes medications and their side effects discussed, including hypoglycemia    2. Check blood glucose:  a) Always check blood sugars before driving. Please see below (under hypoglycemia) on how to manage b) Check a minimum of 3 times/day or more as needed when having symptoms of hypoglycemia.   c) Try to check blood glucose before sleeping/in the middle of the night to ensure that it is remaining stable and not dropping less than 100 d) Check blood glucose more often if sick  3. Diet: a) 3 meals per day schedule b: Restrict carbs to 60-70 grams (4 servings) per meal c) Colorful vegetables - 3 servings a day, and low sugar fruit 2 servings/day Plate control method: 1/4 plate protein, 1/4 starch, 1/2 green, yellow, or red vegetables d) Avoid carbohydrate snacks unless hypoglycemic episode, or increased physical activity  4. Regular exercise as tolerated, preferably 3 or more hours a week  5. Hypoglycemia: a)  Do not drive or operate machinery without first testing blood glucose to assure it is over 90 mg%, or if dizzy, lightheaded, not feeling normal, etc, or  if foot or leg is numb or weak. b)  If blood glucose less than 70, take four 5gm Glucose tabs or 15-30 gm Glucose gel.  Repeat every 15 min as needed until blood sugar is >100 mg/dl. If hypoglycemia persists then call 911.   6. Sick day management: a) Check blood glucose more often b) Continue usual therapy if blood sugars are elevated.   7. Contact the doctor immediately if blood glucose is frequently <60 mg/dl, or an episode of severe hypoglycemia occurs (where someone had to give you  glucose/  glucagon or if you passed out from a low blood glucose), or if blood glucose is persistently >350 mg/dl, for further management  8. A change in level of physical activity or exercise and a change in diet may also affect your blood sugar. Check blood sugars more often and call if needed.  Instructions: 1. Bring glucose meter, blood glucose records on every visit for review 2. Continue to follow up with primary care physician and other providers for medical care 3. Yearly eye  and foot exam 4. Please get blood work done prior to the next appointment

## 2023-07-07 ENCOUNTER — Other Ambulatory Visit: Payer: Self-pay | Admitting: Internal Medicine

## 2023-07-07 DIAGNOSIS — E118 Type 2 diabetes mellitus with unspecified complications: Secondary | ICD-10-CM

## 2023-07-21 ENCOUNTER — Other Ambulatory Visit: Payer: Self-pay | Admitting: Internal Medicine

## 2023-07-21 DIAGNOSIS — E876 Hypokalemia: Secondary | ICD-10-CM

## 2023-07-21 DIAGNOSIS — E118 Type 2 diabetes mellitus with unspecified complications: Secondary | ICD-10-CM

## 2023-07-22 ENCOUNTER — Other Ambulatory Visit: Payer: Self-pay

## 2023-07-22 ENCOUNTER — Other Ambulatory Visit (HOSPITAL_COMMUNITY): Payer: Self-pay

## 2023-07-22 DIAGNOSIS — E876 Hypokalemia: Secondary | ICD-10-CM

## 2023-07-22 MED ORDER — METOPROLOL SUCCINATE ER 25 MG PO TB24
25.0000 mg | ORAL_TABLET | Freq: Every day | ORAL | 0 refills | Status: DC
  Filled 2023-07-22: qty 15, 15d supply, fill #0

## 2023-07-26 ENCOUNTER — Other Ambulatory Visit: Payer: Self-pay | Admitting: Internal Medicine

## 2023-07-26 DIAGNOSIS — E876 Hypokalemia: Secondary | ICD-10-CM

## 2023-07-26 NOTE — Telephone Encounter (Signed)
 Patient picked up patient assistance - Log Noted

## 2023-08-06 ENCOUNTER — Other Ambulatory Visit: Payer: Self-pay | Admitting: "Endocrinology

## 2023-08-06 ENCOUNTER — Other Ambulatory Visit: Payer: Self-pay | Admitting: Internal Medicine

## 2023-08-06 DIAGNOSIS — L97509 Non-pressure chronic ulcer of other part of unspecified foot with unspecified severity: Secondary | ICD-10-CM

## 2023-08-06 DIAGNOSIS — E876 Hypokalemia: Secondary | ICD-10-CM

## 2023-08-06 DIAGNOSIS — E118 Type 2 diabetes mellitus with unspecified complications: Secondary | ICD-10-CM

## 2023-08-06 NOTE — Telephone Encounter (Signed)
REFILL REQUEST COMPLETE

## 2023-08-09 NOTE — Telephone Encounter (Signed)
Duplicate medication. Please address. Thank you

## 2023-08-11 ENCOUNTER — Encounter: Payer: Self-pay | Admitting: "Endocrinology

## 2023-08-11 ENCOUNTER — Ambulatory Visit: Payer: Medicare Other | Admitting: "Endocrinology

## 2023-08-11 VITALS — BP 114/60 | HR 90 | Resp 20 | Ht 72.0 in | Wt 177.0 lb

## 2023-08-11 DIAGNOSIS — E1165 Type 2 diabetes mellitus with hyperglycemia: Secondary | ICD-10-CM

## 2023-08-11 DIAGNOSIS — Z7984 Long term (current) use of oral hypoglycemic drugs: Secondary | ICD-10-CM | POA: Diagnosis not present

## 2023-08-11 DIAGNOSIS — Z794 Long term (current) use of insulin: Secondary | ICD-10-CM | POA: Diagnosis not present

## 2023-08-11 DIAGNOSIS — E7800 Pure hypercholesterolemia, unspecified: Secondary | ICD-10-CM

## 2023-08-11 DIAGNOSIS — Z9641 Presence of insulin pump (external) (internal): Secondary | ICD-10-CM

## 2023-08-11 MED ORDER — DEXCOM G7 SENSOR MISC
1.0000 | 1 refills | Status: DC
Start: 2023-08-11 — End: 2023-11-08

## 2023-08-11 MED ORDER — DEXCOM G7 RECEIVER DEVI: 1.0000 | 0 refills | Status: DC

## 2023-08-11 NOTE — Patient Instructions (Signed)

## 2023-08-11 NOTE — Progress Notes (Signed)
 Outpatient Endocrinology Note Michael Birmingham, MD  08/11/23   Michael Snyder [birth year] [id number]  Referring Provider: Rollene Almarie LABOR, * Primary Care Provider: Rollene Almarie LABOR, MD Reason for consultation: Subjective   Assessment & Plan  Diagnoses and all orders for this visit:  Uncontrolled type 2 diabetes mellitus with hyperglycemia, with long-term current use of insulin  (HCC) -     Ambulatory referral to diabetic education  Insulin  pump in place  Long term (current) use of oral hypoglycemic drugs  Hypercholesterolemia  Other orders -     Continuous Glucose Sensor (DEXCOM G7 SENSOR) MISC; 1 Device by Does not apply route continuous. -     Continuous Glucose Receiver (DEXCOM G7 RECEIVER) DEVI; 1 Device by Does not apply route continuous.   Diabetes complicated by hyperglycemia. Hba1c goal less than 7.0, current Hba1c is 7.5. Will recommend the following: Pioglitazone  30 mg every day Rybelsus  14 mg qd Farxiga  10 mg qd Patient takes fixed boluses for meals  Take 5 unit bolus for break fast, and 5 units with lunch and 5 units with supper (3 units for snack) Pt doesn't count carbs  DM education for setting up DexCom G7  Toujeo  6 units as back up at bedtime (only take if insulin  pump fails)   Continue OMNIPOD insulin  pump settings on AUTOMODE with DexCom: Basal rate = 0.3 units an hour all 24 hours Carb ratio 1:1 Sensitivity 1: 50 Target 120 and correction threshold 120  No known contraindications to any of above medications No history of heart attack/heart failure/pedal edema/urinary bladder cancer   Glucagon  - has it, discussed its use previously   Hyperlipidemia -Last LDL at goal: 50 -on atorvastatin  20 mg QD -Follow low fat diet and exercise   -Blood pressure goal <140/90 - Microalbumin/creatinine at goal < 30 -not on ACE/ARB  -diet changes including salt restriction -limit eating outside -counseled BP targets per standards of diabetes  care -Uncontrolled blood pressure can lead to retinopathy, nephropathy and cardiovascular and atherosclerotic heart disease  Reviewed and counseled on: -A1C target -Blood sugar targets -Complications of uncontrolled diabetes  -Checking blood sugar before meals and bedtime and bring log next visit -All medications with mechanism of action and side effects -Hypoglycemia management: rule of 15's, Glucagon  Emergency Kit and medical alert ID -low-carb low-fat plate-method diet -At least 20 minutes of physical activity per day -Annual dilated retinal eye exam and foot exam -compliance and follow up needs -follow up as scheduled or earlier if problem gets worse  Call if blood sugar is less than 70 or consistently above 250    Take a 15 gm snack of carbohydrate at bedtime before you go to sleep if your blood sugar is less than 100.    If you are going to fast after midnight for a test or procedure, ask your physician for instructions on how to reduce/decrease your insulin  dose.    Call if blood sugar is less than 70 or consistently above 250  -Treating a low sugar by rule of 15  (15 gms of sugar every 15 min until sugar is more than 70) If you feel your sugar is low, test your sugar to be sure If your sugar is low (less than 70), then take 15 grams of a fast acting Carbohydrate (3-4 glucose tablets or glucose gel or 4 ounces of juice or regular soda) Recheck your sugar 15 min after treating low to make sure it is more than 70 If sugar is still less than 70,  treat again with 15 grams of carbohydrate          Don't drive the hour of hypoglycemia  If unconscious/unable to eat or drink by mouth, use glucagon  injection or nasal spray baqsimi and call 911. Can repeat again in 15 min if still unconscious.  Return in about 4 weeks (around 09/08/2023).   I have reviewed current medications, nurse's notes, allergies, vital signs, past medical and surgical history, family medical history, and social  history for this encounter. Counseled patient on symptoms, examination findings, lab findings, imaging results, treatment decisions and monitoring and prognosis. The patient understood the recommendations and agrees with the treatment plan. All questions regarding treatment plan were fully answered.  Michael Birmingham, MD  08/11/23    History of Present Illness Michael Snyder is a 73 y.o. year old male who presents for follow up of Type 2 diabetes mellitus.  Michael Snyder was first diagnosed in 2008.   Diabetes education +  Home diabetes regimen: Pioglitazone  30 mg every day Rybelsus  14 mg every day Farxiga  10mg  every day   OMNIPOD insulin  pump settings currently with DexCom: Basal rate = 0.3 units an hour all 24 hours Carb ratio 1:1 Sensitivity 1: 50 Target 120 and correction threshold 120 Pt doesn't count carbs: takes fixed bolus of 4 units  Non-insulin  hypoglycemic drugs:  Farxiga  10 mg qd, Rybelsus  14 mg qd and Actos  30 mg qd  Previous history: Oral hypoglycemic drugs previously used are: Glipizide , Actos , metformin Insulin  was started in 2020 with Tresiba , subsequently Lantus  and Humalog  as well as 75/25 Lantus  5 units in the evening and Humalog  5-7 units before meals  Recent history:     COMPLICATIONS -  MI/Stroke -  retinopathy -  neuropathy +  nephropathy  BLOOD SUGAR DATA BG range 76-500 Average 228 for 7 day  Physical Exam  BP 114/60 (BP Location: Right Arm, Patient Position: Sitting, Cuff Size: Large)   Pulse 90   Resp 20   Ht 6' (1.829 m)   Wt 177 lb (80.3 kg)   SpO2 99%   BMI 24.01 kg/m    Constitutional: well developed, well nourished Head: normocephalic, atraumatic Eyes: sclera anicteric, no redness Neck: supple Lungs: normal respiratory effort Neurology: alert and oriented Skin: dry, no appreciable rashes Musculoskeletal: no appreciable defects Psychiatric: normal mood and affect Diabetic Foot Exam - Simple   No data filed      Current  Medications Patient's Medications  New Prescriptions   CONTINUOUS GLUCOSE RECEIVER (DEXCOM G7 RECEIVER) DEVI    1 Device by Does not apply route continuous.   CONTINUOUS GLUCOSE SENSOR (DEXCOM G7 SENSOR) MISC    1 Device by Does not apply route continuous.  Previous Medications   ALCOHOL  SWABS (EASY TOUCH ALCOHOL  PREP MEDIUM) 70 % PADS    Apply 1 each topically as directed.   AMLODIPINE  (NORVASC ) 2.5 MG TABLET    Take 2.5 mg by mouth daily.   ASPIRIN EC 81 MG TABLET    Take 81 mg daily by mouth.   ATORVASTATIN  (LIPITOR) 20 MG TABLET    Take 1 tablet (20 mg total) by mouth daily.   BLOOD PRESSURE MONITORING (BLOOD PRESSURE KIT) DEVI    1 kit by Does not apply route daily.   BRIMONIDINE (ALPHAGAN) 0.2 % OPHTHALMIC SOLUTION    SMARTSIG:In Eye(s)   CALCIUM  CARBONATE-VITAMIN D  (CALTRATE 600+D) 600-400 MG-UNIT PER TABLET    Take 1 tablet by mouth 2 (two) times daily.   CHOLECALCIFEROL (VITAMIN D ) 50 MCG (  2000 UT) TABLET    Take 2,000 Units by mouth daily.   CONTINUOUS GLUCOSE TRANSMITTER (DEXCOM G6 TRANSMITTER) MISC    CHANGE EVERY 3 MONTHS   DAPAGLIFLOZIN  PROPANEDIOL (FARXIGA ) 10 MG TABS TABLET    Take 1 tablet (10 mg total) by mouth daily.   GLUCAGON  (GVOKE HYPOPEN  1-PACK) 1 MG/0.2ML SOAJ    Inject 1 mg into the skin as needed (low blood sugar with impaired consciousness).   GLUCOSE BLOOD (ONETOUCH VERIO) TEST STRIP    TEST TWICE DAILY   GVOKE HYPOPEN  1-PACK 1 MG/0.2ML SOAJ    Inject into the skin.   HYDROCHLOROTHIAZIDE  (HYDRODIURIL ) 25 MG TABLET    Take 1 tablet (25 mg total) by mouth daily.   INSULIN  DISPOSABLE PUMP (OMNIPOD 5 G6 PODS, GEN 5,) MISC    1 Device by Does not apply route every 3 (three) days.   INSULIN  LISPRO (HUMALOG ) 100 UNIT/ML INJECTION    INJECT SUBCUTANEOUSLY 50 UNITS  DAILY VIA INSULIN  PUMP   INSULIN  PEN NEEDLE (PEN NEEDLES) 32G X 4 MM MISC    Use as Directed 3 times daily to inject insulin    LANCETS (ONETOUCH DELICA PLUS LANCET33G) MISC    CHECK BLOOD SUGAR TWICE DAILY    LATANOPROST (XALATAN) 0.005 % OPHTHALMIC SOLUTION    Place 1 drop into both eyes at bedtime.   LEVOTHYROXINE  (SYNTHROID ) 25 MCG TABLET    Take 1 tablet (25 mcg total) by mouth daily before breakfast.   LINACLOTIDE  (LINZESS ) 145 MCG CAPS CAPSULE    Take 1 capsule (145 mcg total) by mouth daily before breakfast.   METOPROLOL  SUCCINATE (TOPROL -XL) 25 MG 24 HR TABLET    Take 1 tablet (25 mg total) by mouth daily. Pt.needs an appointment in order to receive future refills. 2nd Attempt.   METOPROLOL  SUCCINATE (TOPROL -XL) 25 MG 24 HR TABLET    Take 1 tablet (25 mg total) by mouth daily.   MULTIPLE VITAMIN (MULTIVITAMIN WITH MINERALS) TABS TABLET    Take 1 tablet by mouth daily.   OMEGA-3 FATTY ACIDS (FISH OIL ) 300 MG CAPS    Take 1 capsule (300 mg total) by mouth daily.   PIOGLITAZONE  (ACTOS ) 30 MG TABLET    Take 1 tablet (30 mg total) by mouth daily.   POLYETHYLENE GLYCOL POWDER (GLYCOLAX /MIRALAX ) 17 GM/SCOOP POWDER    Take 17 g by mouth 2 (two) times daily as needed.   POTASSIUM CHLORIDE  (MICRO-K ) 10 MEQ CR CAPSULE    Take 10 mEq by mouth 2 (two) times daily.   SEMAGLUTIDE  (RYBELSUS ) 14 MG TABS    Take 1 tablet (14 mg total) by mouth daily.   TOUJEO  SOLOSTAR 300 UNIT/ML SOLOSTAR PEN    INJECT SUBCUTANEOUSLY 35 UNITS  DAILY   TRIAMCINOLONE  OINTMENT (KENALOG ) 0.5 %    APPLY TO AFFECTED AREA(S)  TOPICALLY TWICE DAILY  Modified Medications   No medications on file  Discontinued Medications   CONTINUOUS GLUCOSE RECEIVER (DEXCOM G6 RECEIVER) DEVI    USE AS DIRECTED TO CHECK BLOOD  SUGAR   CONTINUOUS GLUCOSE SENSOR (DEXCOM G6 SENSOR) MISC    CHANGE EVERY 10 DAYS    Allergies Allergies  Allergen Reactions   Clindamycin/Lincomycin Swelling    Possible facial and lip swelling   Ace Inhibitors     Facial and lip swelling possibly associated    Past Medical History Past Medical History:  Diagnosis Date   Alcohol  abuse    Arthritis    Diabetes mellitus, type 2 (HCC)    Head injury, closed,  without  LOC 1980s   History of alcohol  abuse    Hyperlipidemia    Hypertension    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   Pacemaker    Medtronic Adapta ADDR01   Seizure disorder (HCC)    Seizures (HCC)    last time in 1990's    Past Surgical History Past Surgical History:  Procedure Laterality Date   LACERATION REPAIR  1980   scalp   PACEMAKER LEAD REMOVAL N/A 03/23/2014   Procedure: PACEMAKER LEAD REMOVAL;  Surgeon: Danelle LELON Birmingham, MD;  Location: [facility] OR;  Service: Cardiovascular;  Laterality: N/A;   PTDVP     7/08 medtronic DDD    Family History family history includes COPD in his mother; Cancer in his brother; Hypertension in his brother, brother, and mother.  Social History Social History   Socioeconomic History   Marital status: Legally Separated    Spouse name: Not on file   Number of children: 3   Years of education: Not on file   Highest education level: Not on file  Occupational History   Occupation: music therapist, truck driver  Tobacco Use   Smoking status: Never   Smokeless tobacco: Never  Vaping Use   Vaping status: Never Used  Substance and Sexual Activity   Alcohol  use: No    Comment: quit 20 yrs ago   Drug use: No   Sexual activity: Yes    Partners: Female  Other Topics Concern   Not on file  Social History Narrative   12th grade education. H/O substance abuse- now abstemious and works with other in recovery..Attends NA on a regular basis and serves as a sponsor. Married 18 years-divorced. Married 5 years 2nd marriage.3 daughters previous marriage, 9 grandchildren, 3 great-grands. Work- arts administrator            Social Drivers of Corporate Investment Banker Strain: Low Risk  (02/20/2020)   Overall Financial Resource Strain (CARDIA)    Difficulty of Paying Living Expenses: Not hard at all  Food Insecurity: No Food Insecurity (02/20/2020)   Hunger Vital Sign    Worried About Running Out of Food in the Last Year: Never true    Ran Out of Food in  the Last Year: Never true  Transportation Needs: No Transportation Needs (02/20/2020)   PRAPARE - Administrator, Civil Service (Medical): No    Lack of Transportation (Non-Medical): No  Physical Activity: Sufficiently Active (02/20/2020)   Exercise Vital Sign    Days of Exercise per Week: 7 days    Minutes of Exercise per Session: 150+ min  Stress: No Stress Concern Present (02/13/2022)   [employer] of Occupational Health - Occupational Stress Questionnaire    Feeling of Stress : Only a little  Social Connections: Moderately Integrated (02/20/2020)   Social Connection and Isolation Panel [NHANES]    Frequency of Communication with Friends and Family: More than three times a week    Frequency of Social Gatherings with Friends and Family: Once a week    Attends Religious Services: 1 to 4 times per year    Active Member of [employer] or Organizations: Yes    Attends Banker Meetings: 1 to 4 times per year    Marital Status: Divorced  Intimate Partner Violence: Not At Risk (02/20/2020)   Humiliation, Afraid, Rape, and Kick questionnaire    Fear of Current or Ex-Partner: No    Emotionally Abused: No  Physically Abused: No    Sexually Abused: No    Lab Results  Component Value Date   HGBA1C 7.5 (H) 06/24/2023   HGBA1C 6.7 (A) 03/31/2023   HGBA1C 7.2 (A) 12/09/2022   Lab Results  Component Value Date   CHOL 104 06/24/2023   Lab Results  Component Value Date   HDL 37 (L) 06/24/2023   Lab Results  Component Value Date   LDLCALC 48 06/24/2023   Lab Results  Component Value Date   TRIG 100 06/24/2023   Lab Results  Component Value Date   CHOLHDL 2.8 06/24/2023   Lab Results  Component Value Date   CREATININE 1.88 (H) 06/24/2023   Lab Results  Component Value Date   GFR 38.12 (L) 04/20/2023   Lab Results  Component Value Date   MICROALBUR 3.6 (H) 04/20/2023      Component Value Date/Time   NA 140 06/24/2023 0833   NA 138 07/24/2022 0924    K 4.2 06/24/2023 0833   CL 106 06/24/2023 0833   CO2 21 06/24/2023 0833   GLUCOSE 211 (H) 06/24/2023 0833   BUN 20 06/24/2023 0833   BUN 19 07/24/2022 0924   CREATININE 1.88 (H) 06/24/2023 0833   CALCIUM  8.9 06/24/2023 0833   PROT 6.6 06/24/2023 0833   ALBUMIN 4.2 04/20/2023 1627   AST 35 06/24/2023 0833   ALT 23 06/24/2023 0833   ALKPHOS 70 04/20/2023 1627   BILITOT 0.9 06/24/2023 0833   GFRNONAA 45 (L) 07/14/2020 1823   GFRAA 54 (L) 11/29/2018 1511      Latest Ref Rng & Units 06/24/2023    8:33 AM 04/20/2023    4:27 PM 08/31/2022   11:25 AM  BMP  Glucose 65 - 139 mg/dL 788  893  816   BUN 7 - 25 mg/dL 20  19    Creatinine 9.29 - 1.28 mg/dL 8.11  8.24    BUN/Creat Ratio 6 - 22 (calc) 11     Sodium 135 - 146 mmol/L 140  139    Potassium 3.5 - 5.3 mmol/L 4.2  3.9    Chloride 98 - 110 mmol/L 106  104    CO2 20 - 32 mmol/L 21  29    Calcium  8.6 - 10.3 mg/dL 8.9  9.6         Component Value Date/Time   WBC 6.2 04/20/2023 1627   RBC 4.48 04/20/2023 1627   HGB 13.3 04/20/2023 1627   HCT 41.0 04/20/2023 1627   PLT 153.0 04/20/2023 1627   MCV 91.5 04/20/2023 1627   MCH 29.4 07/14/2020 1823   MCHC 32.5 04/20/2023 1627   RDW 14.1 04/20/2023 1627   LYMPHSABS 1.4 11/29/2018 1511   MONOABS 0.6 11/29/2018 1511   EOSABS 0.0 11/29/2018 1511   BASOSABS 0.0 11/29/2018 1511     Parts of this note may have been dictated using voice recognition software. There may be variances in spelling and vocabulary which are unintentional. Not all errors are proofread. Please notify the dino if any discrepancies are noted or if the meaning of any statement is not clear.

## 2023-08-30 DIAGNOSIS — N1832 Chronic kidney disease, stage 3b: Secondary | ICD-10-CM | POA: Diagnosis not present

## 2023-09-02 DIAGNOSIS — H47012 Ischemic optic neuropathy, left eye: Secondary | ICD-10-CM | POA: Diagnosis not present

## 2023-09-02 DIAGNOSIS — H401232 Low-tension glaucoma, bilateral, moderate stage: Secondary | ICD-10-CM | POA: Diagnosis not present

## 2023-09-06 ENCOUNTER — Ambulatory Visit

## 2023-09-06 DIAGNOSIS — I495 Sick sinus syndrome: Secondary | ICD-10-CM

## 2023-09-08 ENCOUNTER — Ambulatory Visit: Payer: Medicare Other | Admitting: "Endocrinology

## 2023-09-08 LAB — CUP PACEART REMOTE DEVICE CHECK
Battery Impedance: 910 Ohm
Battery Remaining Longevity: 74 mo
Battery Voltage: 2.78 V
Brady Statistic AP VP Percent: 0 %
Brady Statistic AP VS Percent: 11 %
Brady Statistic AS VP Percent: 0 %
Brady Statistic AS VS Percent: 88 %
Date Time Interrogation Session: 20250303213112
Implantable Lead Connection Status: 753985
Implantable Lead Connection Status: 753985
Implantable Lead Implant Date: 20080711
Implantable Lead Implant Date: 20150918
Implantable Lead Location: 753859
Implantable Lead Location: 753860
Implantable Lead Model: 5076
Implantable Lead Model: 5076
Implantable Pulse Generator Implant Date: 20150918
Lead Channel Impedance Value: 500 Ohm
Lead Channel Impedance Value: 501 Ohm
Lead Channel Pacing Threshold Amplitude: 0.625 V
Lead Channel Pacing Threshold Amplitude: 0.75 V
Lead Channel Pacing Threshold Pulse Width: 0.4 ms
Lead Channel Pacing Threshold Pulse Width: 0.4 ms
Lead Channel Setting Pacing Amplitude: 2 V
Lead Channel Setting Pacing Amplitude: 2.5 V
Lead Channel Setting Pacing Pulse Width: 0.4 ms
Lead Channel Setting Sensing Sensitivity: 2 mV
Zone Setting Status: 755011
Zone Setting Status: 755011

## 2023-09-11 ENCOUNTER — Other Ambulatory Visit: Payer: Self-pay | Admitting: Internal Medicine

## 2023-09-11 DIAGNOSIS — E1165 Type 2 diabetes mellitus with hyperglycemia: Secondary | ICD-10-CM

## 2023-09-23 ENCOUNTER — Encounter: Payer: Self-pay | Admitting: Internal Medicine

## 2023-09-24 DIAGNOSIS — R809 Proteinuria, unspecified: Secondary | ICD-10-CM | POA: Diagnosis not present

## 2023-09-24 DIAGNOSIS — E1129 Type 2 diabetes mellitus with other diabetic kidney complication: Secondary | ICD-10-CM | POA: Diagnosis not present

## 2023-09-24 DIAGNOSIS — N2581 Secondary hyperparathyroidism of renal origin: Secondary | ICD-10-CM | POA: Diagnosis not present

## 2023-09-24 DIAGNOSIS — E1122 Type 2 diabetes mellitus with diabetic chronic kidney disease: Secondary | ICD-10-CM | POA: Diagnosis not present

## 2023-09-24 DIAGNOSIS — I4719 Other supraventricular tachycardia: Secondary | ICD-10-CM | POA: Insufficient documentation

## 2023-09-24 DIAGNOSIS — E876 Hypokalemia: Secondary | ICD-10-CM | POA: Diagnosis not present

## 2023-09-24 DIAGNOSIS — I129 Hypertensive chronic kidney disease with stage 1 through stage 4 chronic kidney disease, or unspecified chronic kidney disease: Secondary | ICD-10-CM | POA: Diagnosis not present

## 2023-09-24 DIAGNOSIS — N1831 Chronic kidney disease, stage 3a: Secondary | ICD-10-CM | POA: Diagnosis not present

## 2023-09-24 DIAGNOSIS — N200 Calculus of kidney: Secondary | ICD-10-CM | POA: Diagnosis not present

## 2023-09-27 ENCOUNTER — Other Ambulatory Visit: Payer: Self-pay | Admitting: Nephrology

## 2023-09-27 ENCOUNTER — Ambulatory Visit: Attending: Internal Medicine | Admitting: Internal Medicine

## 2023-09-27 ENCOUNTER — Encounter: Payer: Self-pay | Admitting: Internal Medicine

## 2023-09-27 VITALS — BP 118/76 | HR 80 | Ht 72.0 in | Wt 175.8 lb

## 2023-09-27 DIAGNOSIS — N1831 Chronic kidney disease, stage 3a: Secondary | ICD-10-CM

## 2023-09-27 DIAGNOSIS — I4719 Other supraventricular tachycardia: Secondary | ICD-10-CM | POA: Diagnosis not present

## 2023-09-27 DIAGNOSIS — I495 Sick sinus syndrome: Secondary | ICD-10-CM | POA: Diagnosis not present

## 2023-09-27 DIAGNOSIS — I4729 Other ventricular tachycardia: Secondary | ICD-10-CM

## 2023-09-27 NOTE — Progress Notes (Signed)
 Patient Care Team: Myrlene Broker, MD as PCP - General (Internal Medicine) Duke Salvia, MD as PCP - Electrophysiology (Cardiology) Szabat, Vinnie Level, [facility] (Inactive) (Pharmacist)   HPI  Michael Snyder is a 73 y.o. male seen in followup for pacemaker Medtronic implanted 2008 because of symptomatic sinus node dysfunction and profound nocturnal bradycardia not felt to be related to sleep apnea. He has had no further symptoms of lightheadedness following device implantation.   Nonsustained ventricular tachycardia was noted on his device.    In 9/15 he underwent extraction of his previously implanted RV lead which become dislodged with  insertion of a new lead and new generator  The patient denies chest pain, shortness of breath, nocturnal dyspnea, orthopnea or peripheral edema.  There have been no palpitations, lightheadedness or syncope.    Still working    DATE TEST EF   20/08 Echo   55-65 %   8/21 Echo   55-60 %   12/24             Date Cr K Hgb  10/18 1.6 4.4     10/20 1.29 3.7   8/23 1.93 4.0 12.6 (3/23)  12/24 1.88 4.2 13.3 (10/24)     And he has a history of a retinal event that was thought to be possibly thromboembolic. He had atrial high rate episodes (brief) detected on his device and the concern was that this represented atrial fibrillation. 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 200 each 5   amLODipine (NORVASC) 2.5 MG tablet Take 2.5 mg by mouth daily.     aspirin EC 81 MG tablet Take 81 mg daily by mouth.     atorvastatin (LIPITOR) 20 MG tablet Take 1 tablet (20 mg total) by mouth daily. 100 tablet 3   Blood Pressure Monitoring (BLOOD PRESSURE KIT) DEVI 1 kit by Does not apply route daily. 1 each 0   brimonidine (ALPHAGAN) 0.2 % ophthalmic solution Place 1 drop into both eyes daily in the afternoon.     Calcium Carbonate-Vitamin D (CALTRATE 600+D) 600-400 MG-UNIT per tablet Take 1 tablet by mouth 2 (two) times daily. 180 tablet 3   Cholecalciferol (VITAMIN D) 50 MCG (2000 UT) tablet Take 2,000 Units by mouth daily.     Continuous Glucose Receiver (DEXCOM G7 RECEIVER) DEVI 1 Device by Does not apply route  continuous. 1 each 0   Continuous Glucose Sensor (DEXCOM G7 SENSOR) MISC 1 Device by Does not apply route continuous. 9 each 1   Continuous Glucose Transmitter (DEXCOM G6 TRANSMITTER) MISC CHANGE EVERY 3 MONTHS 1 each 3   dapagliflozin propanediol (FARXIGA) 10 MG TABS tablet Take 1 tablet (10 mg total) by mouth daily. 90 tablet 1   Glucagon (GVOKE HYPOPEN 1-PACK) 1 MG/0.2ML SOAJ Inject 1 mg into the skin as needed (low blood sugar with impaired consciousness). 0.4 mL 2   glucose blood (ONETOUCH VERIO) test strip TEST TWICE DAILY 200 strip 0   hydrochlorothiazide (HYDRODIURIL) 25 MG tablet Take 1 tablet (25 mg total) by mouth daily. 90 tablet 2   Insulin Disposable Pump (OMNIPOD 5 G6 PODS, GEN 5,) MISC 1 Device by Does not apply route every 3 (three) days. 15 each 3   insulin lispro (HUMALOG) 100 UNIT/ML injection INJECT SUBCUTANEOUSLY 50 UNITS  DAILY  VIA INSULIN PUMP 50 mL 2   Insulin Pen Needle (PEN NEEDLES) 32G X 4 MM MISC Use as Directed 3 times daily to inject insulin 100 each 5   Lancets (ONETOUCH DELICA PLUS LANCET33G) MISC CHECK BLOOD SUGAR TWICE DAILY 200 each 2   latanoprost (XALATAN) 0.005 % ophthalmic solution Place 1 drop into both eyes at bedtime.     levothyroxine (SYNTHROID) 25 MCG tablet TAKE 1 TABLET BY MOUTH ONCE DAILY BEFORE BREAKFAST 90 tablet 0   linaclotide (LINZESS) 145 MCG CAPS capsule Take 1 capsule (145 mcg total) by mouth daily before breakfast. (Patient taking differently: Take 145 mcg by mouth daily before breakfast. As needed) 100 capsule 3   metoprolol succinate (TOPROL-XL) 25 MG 24 hr tablet TAKE 1 TABLET BY MOUTH DAILY 15 tablet 0   Multiple Vitamin (MULTIVITAMIN WITH MINERALS) TABS tablet Take 1 tablet by mouth daily.     Omega-3 Fatty Acids (FISH OIL) 300 MG CAPS Take 1 capsule (300 mg total) by mouth daily. 90 capsule 3   pioglitazone (ACTOS) 30 MG tablet Take 1 tablet (30 mg total) by mouth daily. 90 tablet 3   polyethylene glycol powder (GLYCOLAX/MIRALAX) 17 GM/SCOOP powder Take 17 g by mouth 2 (two) times daily as needed. 255 g 0   potassium chloride (MICRO-K) 10 MEQ CR capsule Take 10 mEq by mouth 2 (two) times daily.     Semaglutide (RYBELSUS) 14 MG TABS Take 1 tablet (14 mg total) by mouth daily. 90 tablet 1   TOUJEO SOLOSTAR 300 UNIT/ML Solostar Pen INJECT SUBCUTANEOUSLY 35 UNITS  DAILY 13.5 mL 2   triamcinolone ointment (KENALOG) 0.5 % APPLY TO AFFECTED AREA(S)  TOPICALLY TWICE DAILY (Patient taking differently: 1 Application as needed.) 90 g 0   No current facility-administered medications for this visit.    Allergies  Allergen Reactions   Clindamycin/Lincomycin Swelling    Possible facial and lip swelling   Ace Inhibitors     Facial and lip swelling possibly associated    Review of Systems negative except from HPI and PMH  Physical Exam BP 118/76   Pulse 80   Ht 6' (1.829 m)   Wt 175  lb 12.8 oz (79.7 kg)   SpO2 99%   BMI 23.84 kg/m  Well developed and well nourished in no acute distress HENT normal Neck supple with JVP-flat Clear Device pocket well healed; without hematoma or erythema.  There is no tethering  Regular rate and rhythm, no  gallop No murmur Abd-soft with active BS No Clubbing cyanosis  edema Skin-warm and dry  A & Oriented  Grossly normal sensory and motor function  ECG sinus at 80 Intervals 37/08/34  Device function is normal. Programming changes storage was changed from atrial electrogram to dual electrogram See Paceart for details     Assessment and  Plan  Sinus bradycardia  1 AV block stable  TIA  No intercurrent events or detected atrial fibrillation   Atrial undersensing   Atrial tachycardia heart rates less than 200 detected on the device with  Pacemaker-Medtronic     Hypertension  Ventricular tachycardia nonsustained  Renal insufficiency grade 3    Blood pressure is well-controlled.  Will continue him on amlodipine and HydroDIURIL metoprolol.    Ventricular high rate event was detected; I think it was probably ventricular in origin, there were no atrial signals with it.  Stories reprogrammed as above  Continue his metoprolol.

## 2023-09-27 NOTE — Patient Instructions (Signed)

## 2023-10-01 ENCOUNTER — Ambulatory Visit
Admission: RE | Admit: 2023-10-01 | Discharge: 2023-10-01 | Disposition: A | Discharge status: Home / Self Care | Source: Ambulatory Visit | Attending: Nephrology | Admitting: Nephrology

## 2023-10-01 DIAGNOSIS — N1831 Chronic kidney disease, stage 3a: Secondary | ICD-10-CM

## 2023-10-01 DIAGNOSIS — N183 Chronic kidney disease, stage 3 unspecified: Secondary | ICD-10-CM | POA: Diagnosis not present

## 2023-10-04 LAB — CUP PACEART INCLINIC DEVICE CHECK
Battery Impedance: 1015 Ohm
Battery Remaining Longevity: 69 mo
Battery Voltage: 2.78 V
Brady Statistic AP VP Percent: 0 %
Brady Statistic AP VS Percent: 11 %
Brady Statistic AS VP Percent: 0 %
Brady Statistic AS VS Percent: 88 %
Date Time Interrogation Session: 20250324104900
Implantable Lead Connection Status: 753985
Implantable Lead Connection Status: 753985
Implantable Lead Implant Date: 20080711
Implantable Lead Implant Date: 20150918
Implantable Lead Location: 753859
Implantable Lead Location: 753860
Implantable Lead Model: 5076
Implantable Lead Model: 5076
Implantable Pulse Generator Implant Date: 20150918
Lead Channel Impedance Value: 487 Ohm
Lead Channel Impedance Value: 488 Ohm
Lead Channel Pacing Threshold Amplitude: 0.625 V
Lead Channel Pacing Threshold Amplitude: 0.875 V
Lead Channel Pacing Threshold Pulse Width: 0.4 ms
Lead Channel Pacing Threshold Pulse Width: 0.4 ms
Lead Channel Setting Pacing Amplitude: 2 V
Lead Channel Setting Pacing Amplitude: 2.5 V
Lead Channel Setting Pacing Pulse Width: 0.4 ms
Lead Channel Setting Sensing Sensitivity: 2 mV
Zone Setting Status: 755011
Zone Setting Status: 755011

## 2023-10-07 ENCOUNTER — Other Ambulatory Visit: Payer: Self-pay | Admitting: Internal Medicine

## 2023-10-07 DIAGNOSIS — E876 Hypokalemia: Secondary | ICD-10-CM

## 2023-10-08 NOTE — Progress Notes (Signed)
 Remote pacemaker transmission.

## 2023-10-08 NOTE — Addendum Note (Signed)
 Addended by: Geralyn Flash D on: 10/08/2023 10:08 AM   Modules accepted: Orders

## 2023-10-25 ENCOUNTER — Telehealth: Payer: Self-pay

## 2023-10-25 NOTE — Telephone Encounter (Signed)
 Pt left vm on my phone regarding if his pt assist medication Rybelsus  had arrived in office yet. Called pt back and informed pt that medication had not arrived yet. And that the office would cal him when we received medication.

## 2023-10-27 ENCOUNTER — Telehealth: Payer: Self-pay | Admitting: "Endocrinology

## 2023-10-27 NOTE — Telephone Encounter (Signed)
 Patient came in to office yesterday, Tuesday 10/26/2023, and completed his part of the Novo Nordisk Patient Surveyor, quantity.  The application is in Dr. Idell Majestic folder in the front office.

## 2023-10-29 NOTE — Telephone Encounter (Signed)
 Called pt to inform him that his missed a section on patient assistance paperwork to sign.  Pt stated that he would return to office to sign paper work this week.

## 2023-11-07 ENCOUNTER — Other Ambulatory Visit: Payer: Self-pay | Admitting: "Endocrinology

## 2023-11-08 NOTE — Telephone Encounter (Signed)
 Requested Prescriptions   Pending Prescriptions Disp Refills   Continuous Glucose Sensor (DEXCOM G7 SENSOR) MISC [Pharmacy Med Name: DEXCOM_G7 SENSOR] 10 each 2    Sig: USE AS DIRECTED CONTINUOUSLY

## 2023-11-30 ENCOUNTER — Other Ambulatory Visit: Payer: Self-pay | Admitting: "Endocrinology

## 2023-11-30 DIAGNOSIS — E11621 Type 2 diabetes mellitus with foot ulcer: Secondary | ICD-10-CM

## 2023-12-06 ENCOUNTER — Encounter

## 2023-12-14 ENCOUNTER — Other Ambulatory Visit: Payer: Self-pay | Admitting: Internal Medicine

## 2023-12-14 DIAGNOSIS — E1165 Type 2 diabetes mellitus with hyperglycemia: Secondary | ICD-10-CM

## 2023-12-28 ENCOUNTER — Telehealth: Payer: Self-pay | Admitting: Internal Medicine

## 2023-12-28 NOTE — Telephone Encounter (Signed)
 Attempted to contact pt, mailbox full. Also attempted to contact pharmacy, but line just rings with no answer. Script sent to pharmacy on 12/15/23 with confirmed receipt.

## 2023-12-28 NOTE — Telephone Encounter (Unsigned)
 Copied from CRM [id number]. Topic: Clinical - Medication Refill >> Dec 28, 2023 11:37 AM [place] E wrote: Medication: levothyroxine  (SYNTHROID ) 25 MCG tablet  Has the patient contacted their pharmacy? Yes (Agent: If no, request that the patient contact the pharmacy for the refill. If patient does not wish to contact the pharmacy document the reason why and proceed with request.) (Agent: If yes, when and what did the pharmacy advise?)  This is the patient's preferred pharmacy:   [facility] [address] ([address]), [place] - [address] [place] ([place]) [place] [address] Phone: [phone] Fax: [phone]   Is this the correct pharmacy for this prescription? Yes If no, delete pharmacy and type the correct one.   Has the prescription been filled recently? Yes  Is the patient out of the medication? Yes  Has the patient been seen for an appointment in the last year OR does the patient have an upcoming appointment? Yes  Can we respond through MyChart? Yes  Agent: Please be advised that Rx refills may take up to 3 business days. We ask that you follow-up with your pharmacy.

## 2024-03-03 ENCOUNTER — Other Ambulatory Visit: Payer: Self-pay | Admitting: Internal Medicine

## 2024-03-03 DIAGNOSIS — E118 Type 2 diabetes mellitus with unspecified complications: Secondary | ICD-10-CM

## 2024-03-03 NOTE — Telephone Encounter (Signed)
 Copied from CRM [id number]. Topic: Clinical - Medication Refill >> Mar 03, 2024  9:44 AM Roselie BROCKS wrote: Medication:  atorvastatin  (LIPITOR) 20 MG tablet Has the patient contacted their pharmacy? Yes (Agent: If no, request that the patient contact the pharmacy for the refill. If patient does not wish to contact the pharmacy document the reason why and proceed with request.) (Agent: If yes, when and what did the pharmacy advise?)  This is the patient's preferred pharmacy:  [facility] - [place], [place] - [address] [place] [address] Phone: [phone] Fax: [phone]     Is this the correct pharmacy for this prescription? Yes If no, delete pharmacy and type the correct one.   Has the prescription been filled recently? No  Is the patient out of the medication? Yes  Has the patient been seen for an appointment in the last year OR does the patient have an upcoming appointment? No  Can we respond through MyChart? No  Agent: Please be advised that Rx refills may take up to 3 business days. We ask that you follow-up with your pharmacy.

## 2024-03-06 ENCOUNTER — Other Ambulatory Visit: Payer: Self-pay | Admitting: "Endocrinology

## 2024-03-06 DIAGNOSIS — E11621 Type 2 diabetes mellitus with foot ulcer: Secondary | ICD-10-CM

## 2024-03-07 ENCOUNTER — Encounter

## 2024-03-07 MED ORDER — ATORVASTATIN CALCIUM 20 MG PO TABS: 20.0000 mg | ORAL_TABLET | Freq: Every day | ORAL | 0 refills | Status: DC

## 2024-03-10 ENCOUNTER — Ambulatory Visit

## 2024-03-10 VITALS — Ht 72.0 in | Wt 175.0 lb

## 2024-03-10 DIAGNOSIS — Z0000 Encounter for general adult medical examination without abnormal findings: Secondary | ICD-10-CM | POA: Diagnosis not present

## 2024-03-10 NOTE — Progress Notes (Signed)
 Subjective:   Michael Snyder is a 73 y.o. who presents for a Medicare Wellness preventive visit.  As a reminder, Annual Wellness Visits don't include a physical exam, and some assessments may be limited, especially if this visit is performed virtually. We may recommend an in-person follow-up visit with your provider if needed.  Visit Complete: Virtual I connected with  Michael Snyder on 03/10/24 by a audio enabled telemedicine application and verified that I am speaking with the correct person using two identifiers.  Patient Location: Home  Provider Location: Home Office  I discussed the limitations of evaluation and management by telemedicine. The patient expressed understanding and agreed to proceed.  Vital Signs: Because this visit was a virtual/telehealth visit, some criteria may be missing or patient reported. Any vitals not documented were not able to be obtained and vitals that have been documented are patient reported.    Persons Participating in Visit: Patient.  AWV Questionnaire: No: Patient Medicare AWV questionnaire was not completed prior to this visit.  Cardiac Risk Factors include: advanced age (>7men, >84 women);male gender;hypertension     Objective:    Today's Vitals   03/10/24 1122  Weight: 175 lb (79.4 kg)  Height: 6' (1.829 m)   Body mass index is 23.73 kg/m.     03/10/2024   11:28 AM 03/13/2021    9:02 AM 08/19/2020   10:26 AM 07/14/2020    1:38 PM 02/20/2020    8:54 AM 09/28/2019    2:51 PM 03/28/2019    7:50 PM  Advanced Directives  Does Patient Have a Medical Advance Directive? Yes Yes Yes Yes No No No  Type of Estate agent of [place];Living will Living will;Healthcare Power of [employer] Power of [place];Living will     Does patient want to make changes to medical advance directive?  No - Patient declined       Copy of Healthcare Power of Attorney in Chart? No - copy requested No - copy requested  No - copy requested      Would patient like information on creating a medical advance directive?     No - Patient declined No - Patient declined No - Patient declined    Current Medications (verified) Outpatient Encounter Medications as of 03/10/2024  Medication Sig   Alcohol  Swabs (EASY TOUCH ALCOHOL  PREP MEDIUM) 70 % PADS Apply 1 each topically as directed.   amLODipine  (NORVASC ) 2.5 MG tablet Take 2.5 mg by mouth daily.   aspirin EC 81 MG tablet Take 81 mg daily by mouth.   atorvastatin  (LIPITOR) 20 MG tablet Take 1 tablet (20 mg total) by mouth daily.   Blood Pressure Monitoring (BLOOD PRESSURE KIT) DEVI 1 kit by Does not apply route daily.   brimonidine (ALPHAGAN) 0.2 % ophthalmic solution Place 1 drop into both eyes daily in the afternoon.   Calcium  Carbonate-Vitamin D  (CALTRATE 600+D) 600-400 MG-UNIT per tablet Take 1 tablet by mouth 2 (two) times daily.   Cholecalciferol (VITAMIN D ) 50 MCG (2000 UT) tablet Take 2,000 Units by mouth daily.   Continuous Glucose Receiver (DEXCOM G7 RECEIVER) DEVI USE 1 DEVICE TO CHECK BLOOD  GLUCOSE CONTINUOUSLY.   Continuous Glucose Sensor (DEXCOM G7 SENSOR) MISC USE AS DIRECTED CONTINUOUSLY   Continuous Glucose Transmitter (DEXCOM G6 TRANSMITTER) MISC CHANGE EVERY 3 MONTHS   dapagliflozin  propanediol (FARXIGA ) 10 MG TABS tablet Take 1 tablet (10 mg total) by mouth daily.   Glucagon  (GVOKE HYPOPEN  1-PACK) 1 MG/0.2ML SOAJ Inject 1 mg into the  skin as needed (low blood sugar with impaired consciousness).   glucose blood (ONETOUCH VERIO) test strip TEST TWICE DAILY   hydrochlorothiazide  (HYDRODIURIL ) 25 MG tablet Take 1 tablet (25 mg total) by mouth daily.   Insulin  Disposable Pump (OMNIPOD 5 G6 PODS, GEN 5,) MISC 1 Device by Does not apply route every 3 (three) days.   insulin  lispro (HUMALOG ) 100 UNIT/ML injection INJECT SUBCUTANEOUSLY 50 UNITS  DAILY VIA INSULIN  PUMP   Insulin  Pen Needle (PEN NEEDLES) 32G X 4 MM MISC Use as Directed 3 times daily to inject insulin    Lancets  (ONETOUCH DELICA PLUS LANCET33G) MISC CHECK BLOOD SUGAR TWICE DAILY   latanoprost (XALATAN) 0.005 % ophthalmic solution Place 1 drop into both eyes at bedtime.   levothyroxine  (SYNTHROID ) 25 MCG tablet TAKE 1 TABLET BY MOUTH ONCE DAILY BEFORE BREAKFAST   levothyroxine  (SYNTHROID ) 25 MCG tablet TAKE 1 TABLET BY MOUTH ONCE DAILY BEFORE BREAKFAST   linaclotide  (LINZESS ) 145 MCG CAPS capsule Take 1 capsule (145 mcg total) by mouth daily before breakfast. (Patient taking differently: Take 145 mcg by mouth daily before breakfast. As needed)   metoprolol  succinate (TOPROL -XL) 25 MG 24 hr tablet TAKE 1 TABLET BY MOUTH DAILY   Multiple Vitamin (MULTIVITAMIN WITH MINERALS) TABS tablet Take 1 tablet by mouth daily.   Omega-3 Fatty Acids (FISH OIL ) 300 MG CAPS Take 1 capsule (300 mg total) by mouth daily.   pioglitazone  (ACTOS ) 30 MG tablet Take 1 tablet (30 mg total) by mouth daily.   polyethylene glycol powder (GLYCOLAX /MIRALAX ) 17 GM/SCOOP powder Take 17 g by mouth 2 (two) times daily as needed.   potassium chloride  (MICRO-K ) 10 MEQ CR capsule Take 10 mEq by mouth 2 (two) times daily.   Semaglutide  (RYBELSUS ) 14 MG TABS Take 1 tablet (14 mg total) by mouth daily.   TOUJEO  SOLOSTAR 300 UNIT/ML Solostar Pen INJECT SUBCUTANEOUSLY 35 UNITS  DAILY   triamcinolone  ointment (KENALOG ) 0.5 % APPLY TO AFFECTED AREA(S)  TOPICALLY TWICE DAILY (Patient taking differently: 1 Application as needed.)   No facility-administered encounter medications on file as of 03/10/2024.    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   Pacemaker    Medtronic Adapta ADDR01   Seizure disorder (HCC)    Seizures (HCC)    last time in 1990's   Past  Surgical History:  Procedure Laterality Date   LACERATION REPAIR  1980   scalp   PACEMAKER LEAD REMOVAL N/A 03/23/2014   Procedure: PACEMAKER LEAD REMOVAL;  Surgeon: Danelle LELON Birmingham, MD;  Location: [facility] OR;  Service: Cardiovascular;  Laterality: N/A;   PTDVP     7/08 medtronic DDD   Family History  Problem Relation Age of Onset   Hypertension Mother    COPD Mother    Hypertension Brother    Cancer Brother        leukemia   Hypertension Brother    Diabetes Neg Hx    Social History   Socioeconomic History   Marital status: Legally Separated    Spouse name: Not on file   Number of children: 3   Years of education: Not on file   Highest education  level: Not on file  Occupational History   Occupation: Music therapist, truck driver  Tobacco Use   Smoking status: Never   Smokeless tobacco: Never  Vaping Use   Vaping status: Never Used  Substance and Sexual Activity   Alcohol  use: No    Comment: quit 20 yrs ago   Drug use: No   Sexual activity: Yes    Partners: Female  Other Topics Concern   Not on file  Social History Narrative   12th grade education. H/O substance abuse- now abstemious and works with other in recovery..Attends NA on a regular basis and serves as a sponsor. Married 18 years-divorced. Married 5 years 2nd marriage.3 daughters previous marriage, 9 grandchildren, 3 great-grands. Work- Arts administrator            Social Drivers of Corporate investment banker Strain: Low Risk  (03/10/2024)   Overall Financial Resource Strain (CARDIA)    Difficulty of Paying Living Expenses: Not hard at all  Food Insecurity: No Food Insecurity (03/10/2024)   Hunger Vital Sign    Worried About Running Out of Food in the Last Year: Never true    Ran Out of Food in the Last Year: Never true  Transportation Needs: No Transportation Needs (03/10/2024)   PRAPARE - Administrator, Civil Service (Medical): No    Lack of Transportation (Non-Medical): No  Physical Activity:  Inactive (03/10/2024)   Exercise Vital Sign    Days of Exercise per Week: 0 days    Minutes of Exercise per Session: 0 min  Stress: No Stress Concern Present (03/10/2024)   [employer] of Occupational Health - Occupational Stress Questionnaire    Feeling of Stress: Not at all  Social Connections: Moderately Integrated (03/10/2024)   Social Connection and Isolation Panel    Frequency of Communication with Friends and Family: More than three times a week    Frequency of Social Gatherings with Friends and Family: More than three times a week    Attends Religious Services: More than 4 times per year    Active Member of [employer] or Organizations: Yes    Attends Engineer, structural: More than 4 times per year    Marital Status: Divorced    Tobacco Counseling Counseling given: Not Answered    Clinical Intake:  Pre-visit preparation completed: Yes  Pain : No/denies pain     BMI - recorded: 23.73 Nutritional Status: BMI of 19-24  Normal Nutritional Risks: None Diabetes: Yes CBG done?: Yes (CBG 185 Per patient) CBG resulted in Enter/ Edit results?: Yes Did pt. bring in CBG monitor from home?: No  Lab Results  Component Value Date   HGBA1C 7.5 (H) 06/24/2023   HGBA1C 6.7 (A) 03/31/2023   HGBA1C 7.2 (A) 12/09/2022     How often do you need to have someone help you when you read instructions, pamphlets, or other written materials from your doctor or pharmacy?: 1 - Never  Interpreter Needed?: No  Information entered by :: Rojelio Blush LPN   Activities of Daily Living     03/10/2024   11:28 AM  In your present state of health, do you have any difficulty performing the following activities:  Hearing? 0  Vision? 0  Difficulty concentrating or making decisions? 0  Walking or climbing stairs? 0  Dressing or bathing? 0  Doing errands, shopping? 0  Preparing Food and eating ? N  Using the Toilet? N  In the past six months, have you accidently  leaked urine? N  Do  you have problems with loss of bowel control? N  Managing your Medications? N  Managing your Finances? N  Housekeeping or managing your Housekeeping? N    Patient Care Team: Rollene Almarie LABOR, MD as PCP - General (Internal Medicine) Fernande Elspeth BROCKS, MD as PCP - Electrophysiology (Cardiology) Szabat, Toribio BROCKS, [facility] (Inactive) (Pharmacist)  I have updated your Care Teams any recent Medical Services you may have received from other providers in the past year.     Assessment:   This is a routine wellness examination for Michael Snyder.  Hearing/Vision screen Hearing Screening - Comments:: Denies hearing difficulties   Vision Screening - Comments:: Wears rx glasses - up to date with routine eye exams with  [facility]   Goals Addressed               This Visit's Progress     Remain active (pt-stated)         Depression Screen     03/10/2024   11:27 AM 04/20/2023    3:55 PM 02/13/2022    2:52 PM 03/13/2021    9:08 AM 08/19/2020   10:26 AM 02/20/2020    8:51 AM 09/28/2019    2:51 PM  PHQ 2/9 Scores  PHQ - 2 Score 0 0 2 0 0 0 0  PHQ- 9 Score   5        Fall Risk     03/10/2024   11:28 AM 04/20/2023    3:54 PM 04/20/2023    3:44 PM 03/13/2021    9:03 AM 08/19/2020   10:26 AM  Fall Risk   Falls in the past year? 0 0 0 0 0  Number falls in past yr: 0 0 0 0   Injury with Fall? 0 0 0 0   Risk for fall due to : No Fall Risks   No Fall Risks   Follow up Falls evaluation completed  Falls evaluation completed Falls evaluation completed       Data saved with a previous flowsheet row definition    MEDICARE RISK AT HOME:  Medicare Risk at Home Any stairs in or around the home?: No If so, are there any without handrails?: No Home free of loose throw rugs in walkways, pet beds, electrical cords, etc?: Yes Adequate lighting in your home to reduce risk of falls?: Yes Life alert?: No Use of a cane, walker or w/c?: No Grab bars in the bathroom?: Yes Shower chair or bench in  shower?: No Elevated toilet seat or a handicapped toilet?: Yes  TIMED UP AND GO:  Was the test performed?  No  Cognitive Function: 6CIT completed        03/10/2024   11:28 AM 04/20/2023    3:55 PM 02/20/2020    8:55 AM  6CIT Screen  What Year? 0 points 0 points 0 points  What month? 0 points 0 points 0 points  What time? 0 points 0 points 0 points  Count back from 20 0 points 0 points 0 points  Months in reverse 4 points 0 points 0 points  Repeat phrase 0 points 0 points 0 points  Total Score 4 points 0 points 0 points    Immunizations Immunization History  Administered Date(s) Administered   Fluad Quad(high Dose 65+) 04/20/2019, 07/29/2020, 03/13/2021   Fluad Trivalent(High Dose 65+) 04/20/2023   INFLUENZA, HIGH DOSE SEASONAL PF 06/02/2016, 04/05/2017, 03/29/2018   Influenza Whole 03/30/2008, 04/16/2009, 06/05/2010   Influenza,inj,Quad PF,6+ Mos  05/16/2013, 03/08/2014, 08/21/2015   PFIZER(Purple Top)SARS-COV-2 Vaccination 10/27/2019, 11/17/2019, 05/28/2020, 10/24/2020   Pneumococcal Conjugate-13 11/01/2014   Pneumococcal Polysaccharide-23 09/13/2012, 12/30/2017   Td 03/30/2008   Tdap 05/02/2018   Zoster Recombinant(Shingrix) 12/30/2017   Zoster, Live 07/20/2014    Screening Tests Health Maintenance  Topic Date Due   Diabetic kidney evaluation - Urine ACR  Never done   Zoster Vaccines- Shingrix (2 of 2) 02/24/2018   FOOT EXAM  12/09/2023   HEMOGLOBIN A1C  12/23/2023   Influenza Vaccine  02/04/2024   COVID-19 Vaccine (5 - 2025-26 season) 03/06/2024   OPHTHALMOLOGY EXAM  03/24/2024   Diabetic kidney evaluation - eGFR measurement  06/23/2024   Medicare Annual Wellness (AWV)  03/10/2025   Colonoscopy  06/20/2026   DTaP/Tdap/Td (3 - Td or Tdap) 05/02/2028   Pneumococcal Vaccine: 50+ Years  Completed   Hepatitis C Screening  Completed   HPV VACCINES  Aged Out   Meningococcal B Vaccine  Aged Out    Health Maintenance  Health Maintenance Due  Topic Date Due    Diabetic kidney evaluation - Urine ACR  Never done   Zoster Vaccines- Shingrix (2 of 2) 02/24/2018   FOOT EXAM  12/09/2023   HEMOGLOBIN A1C  12/23/2023   Influenza Vaccine  02/04/2024   COVID-19 Vaccine (5 - 2025-26 season) 03/06/2024   Health Maintenance Items Addressed:   Additional Screening:  Vision Screening: Recommended annual ophthalmology exams for early detection of glaucoma and other disorders of the eye. Would you like a referral to an eye doctor? No    Dental Screening: Recommended annual dental exams for proper oral hygiene  Community Resource Referral / Chronic Care Management: CRR required this visit?  No   CCM required this visit?  No   Plan:    I have personally reviewed and noted the following in the patient's chart:   Medical and social history Use of alcohol , tobacco or illicit drugs  Current medications and supplements including opioid prescriptions. Patient is not currently taking opioid prescriptions. Functional ability and status Nutritional status Physical activity Advanced directives List of other physicians Hospitalizations, surgeries, and ER visits in previous 12 months Vitals Screenings to include cognitive, depression, and falls Referrals and appointments  In addition, I have reviewed and discussed with patient certain preventive protocols, quality metrics, and best practice recommendations. A written personalized care plan for preventive services as well as general preventive health recommendations were provided to patient.   Rojelio LELON Blush, LPN   0/10/7972   After Visit Summary: (MyChart) Due to this being a telephonic visit, the after visit summary with patients personalized plan was offered to patient via MyChart   Notes: Nothing significant to report at this time.

## 2024-03-10 NOTE — Patient Instructions (Addendum)
 Michael Snyder,  Thank you for taking the time for your Medicare Wellness Visit. I appreciate your continued commitment to your health goals. Please review the care plan we discussed, and feel free to reach out if I can assist you further.  Medicare recommends these wellness visits once per year to help you and your care team stay ahead of potential health issues. These visits are designed to focus on prevention, allowing your provider to concentrate on managing your acute and chronic conditions during your regular appointments.  Please note that Annual Wellness Visits do not include a physical exam. Some assessments may be limited, especially if the visit was conducted virtually. If needed, we may recommend a separate in-person follow-up with your provider.  Ongoing Care Seeing your primary care provider every 3 to 6 months helps us  monitor your health and provide consistent, personalized care.   Referrals If a referral was made during today's visit and you haven't received any updates within two weeks, please contact the referred provider directly to check on the status.  Recommended Screenings:  Health Maintenance  Topic Date Due   Yearly kidney health urinalysis for diabetes  Never done   Zoster (Shingles) Vaccine (2 of 2) 02/24/2018   Complete foot exam   12/09/2023   Hemoglobin A1C  12/23/2023   Flu Shot  02/04/2024   COVID-19 Vaccine (5 - 2025-26 season) 03/06/2024   Eye exam for diabetics  03/24/2024   Yearly kidney function blood test for diabetes  06/23/2024   Medicare Annual Wellness Visit  03/10/2025   Colon Cancer Screening  06/20/2026   DTaP/Tdap/Td vaccine (3 - Td or Tdap) 05/02/2028   Pneumococcal Vaccine for age over 69  Completed   Hepatitis C Screening  Completed   HPV Vaccine  Aged Out   Meningitis B Vaccine  Aged Out       03/10/2024   11:28 AM  Advanced Directives  Does Patient Have a Medical Advance Directive? Yes  Type of Estate agent of  [place];Living will  Copy of Healthcare Power of Attorney in Chart? No - copy requested   Advance Care Planning is important because it: Ensures you receive medical care that aligns with your values, goals, and preferences. Provides guidance to your family and loved ones, reducing the emotional burden of decision-making during critical moments.  Vision: Annual vision screenings are recommended for early detection of glaucoma, cataracts, and diabetic retinopathy. These exams can also reveal signs of chronic conditions such as diabetes and high blood pressure.  Dental: Annual dental screenings help detect early signs of oral cancer, gum disease, and other conditions linked to overall health, including heart disease and diabetes.  Please see the attached documents for additional preventive care recommendations.

## 2024-03-14 ENCOUNTER — Encounter: Admitting: Internal Medicine

## 2024-03-23 DIAGNOSIS — H47012 Ischemic optic neuropathy, left eye: Secondary | ICD-10-CM | POA: Diagnosis not present

## 2024-03-23 DIAGNOSIS — H401232 Low-tension glaucoma, bilateral, moderate stage: Secondary | ICD-10-CM | POA: Diagnosis not present

## 2024-04-03 ENCOUNTER — Ambulatory Visit (INDEPENDENT_AMBULATORY_CARE_PROVIDER_SITE_OTHER)

## 2024-04-03 ENCOUNTER — Other Ambulatory Visit: Payer: Self-pay | Admitting: Internal Medicine

## 2024-04-03 ENCOUNTER — Ambulatory Visit: Payer: Self-pay | Admitting: Cardiology

## 2024-04-03 DIAGNOSIS — E118 Type 2 diabetes mellitus with unspecified complications: Secondary | ICD-10-CM

## 2024-04-03 DIAGNOSIS — I4729 Other ventricular tachycardia: Secondary | ICD-10-CM

## 2024-04-03 LAB — CUP PACEART REMOTE DEVICE CHECK
Battery Impedance: 1174 Ohm
Battery Remaining Longevity: 61 mo
Battery Voltage: 2.77 V
Brady Statistic AP VP Percent: 0 %
Brady Statistic AP VS Percent: 31 %
Brady Statistic AS VP Percent: 1 %
Brady Statistic AS VS Percent: 68 %
Date Time Interrogation Session: 20250928190512
Implantable Lead Connection Status: 753985
Implantable Lead Connection Status: 753985
Implantable Lead Implant Date: 20080711
Implantable Lead Implant Date: 20150918
Implantable Lead Location: 753859
Implantable Lead Location: 753860
Implantable Lead Model: 5076
Implantable Lead Model: 5076
Implantable Pulse Generator Implant Date: 20150918
Lead Channel Impedance Value: 486 Ohm
Lead Channel Impedance Value: 516 Ohm
Lead Channel Pacing Threshold Amplitude: 0.625 V
Lead Channel Pacing Threshold Amplitude: 0.75 V
Lead Channel Pacing Threshold Pulse Width: 0.4 ms
Lead Channel Pacing Threshold Pulse Width: 0.4 ms
Lead Channel Setting Pacing Amplitude: 2 V
Lead Channel Setting Pacing Amplitude: 2.5 V
Lead Channel Setting Pacing Pulse Width: 0.4 ms
Lead Channel Setting Sensing Sensitivity: 2 mV
Zone Setting Status: 755011
Zone Setting Status: 755011

## 2024-04-05 NOTE — Progress Notes (Signed)
 Remote PPM Transmission

## 2024-04-13 ENCOUNTER — Other Ambulatory Visit: Payer: Self-pay | Admitting: "Endocrinology

## 2024-04-13 DIAGNOSIS — E1165 Type 2 diabetes mellitus with hyperglycemia: Secondary | ICD-10-CM

## 2024-04-17 ENCOUNTER — Encounter: Admitting: Internal Medicine

## 2024-05-02 ENCOUNTER — Other Ambulatory Visit: Payer: Self-pay | Admitting: "Endocrinology

## 2024-05-02 DIAGNOSIS — E1165 Type 2 diabetes mellitus with hyperglycemia: Secondary | ICD-10-CM

## 2024-05-03 ENCOUNTER — Telehealth: Payer: Self-pay

## 2024-05-03 NOTE — Telephone Encounter (Signed)
 Copied from CRM [id number]. Topic: General - Call Back - No Documentation >> May 02, 2024  4:03 PM Michael Snyder wrote: Reason for CRM: Patient stated someone called him and didn't leave a vm. Per cal and myself we couldn't see any msgs left on who called.

## 2024-05-09 ENCOUNTER — Other Ambulatory Visit: Payer: Self-pay | Admitting: Internal Medicine

## 2024-05-09 DIAGNOSIS — E118 Type 2 diabetes mellitus with unspecified complications: Secondary | ICD-10-CM

## 2024-05-12 ENCOUNTER — Telehealth: Payer: Self-pay | Admitting: Pharmacist

## 2024-05-12 NOTE — Progress Notes (Signed)
 Pharmacy Quality Measure Review  This patient is appearing on a report for being at risk of failing the Glycemic Status Assessment in Diabetes measure this calendar year.   Last documented A1c or GMI 7.5% on 06/24/23  Had PCP f/u scheduled in October however pt did not show for appt. Called patient to get appt rescheduled. Scheduled for 05/23/24.  Darrelyn Drum, PharmD, BCPS, CPP Clinical Pharmacist Practitioner [facility] [phone]

## 2024-05-23 ENCOUNTER — Ambulatory Visit: Admitting: Internal Medicine

## 2024-05-23 ENCOUNTER — Encounter: Payer: Self-pay | Admitting: Internal Medicine

## 2024-05-23 VITALS — BP 120/80 | HR 88 | Temp 98.0°F | Ht 72.0 in | Wt 162.0 lb

## 2024-05-23 DIAGNOSIS — Z23 Encounter for immunization: Secondary | ICD-10-CM

## 2024-05-23 DIAGNOSIS — Z0000 Encounter for general adult medical examination without abnormal findings: Secondary | ICD-10-CM | POA: Diagnosis not present

## 2024-05-23 DIAGNOSIS — I4891 Unspecified atrial fibrillation: Secondary | ICD-10-CM

## 2024-05-23 DIAGNOSIS — I10 Essential (primary) hypertension: Secondary | ICD-10-CM

## 2024-05-23 DIAGNOSIS — E1122 Type 2 diabetes mellitus with diabetic chronic kidney disease: Secondary | ICD-10-CM | POA: Diagnosis not present

## 2024-05-23 DIAGNOSIS — I480 Paroxysmal atrial fibrillation: Secondary | ICD-10-CM

## 2024-05-23 DIAGNOSIS — M25511 Pain in right shoulder: Secondary | ICD-10-CM

## 2024-05-23 DIAGNOSIS — N1832 Chronic kidney disease, stage 3b: Secondary | ICD-10-CM | POA: Diagnosis not present

## 2024-05-23 DIAGNOSIS — E039 Hypothyroidism, unspecified: Secondary | ICD-10-CM

## 2024-05-23 DIAGNOSIS — Z794 Long term (current) use of insulin: Secondary | ICD-10-CM

## 2024-05-23 DIAGNOSIS — E118 Type 2 diabetes mellitus with unspecified complications: Secondary | ICD-10-CM

## 2024-05-23 DIAGNOSIS — G8929 Other chronic pain: Secondary | ICD-10-CM

## 2024-05-23 LAB — CBC
HCT: 43.3 % (ref 39.0–52.0)
Hemoglobin: 14.3 g/dL (ref 13.0–17.0)
MCHC: 32.9 g/dL (ref 30.0–36.0)
MCV: 88.3 fl (ref 78.0–100.0)
Platelets: 136 K/uL — ABNORMAL LOW (ref 150.0–400.0)
RBC: 4.9 Mil/uL (ref 4.22–5.81)
RDW: 12.9 % (ref 11.5–15.5)
WBC: 6.2 K/uL (ref 4.0–10.5)

## 2024-05-23 LAB — TSH: TSH: 2.77 u[IU]/mL (ref 0.35–5.50)

## 2024-05-23 LAB — COMPREHENSIVE METABOLIC PANEL WITH GFR
ALT: 44 U/L (ref 0–53)
AST: 43 U/L — ABNORMAL HIGH (ref 0–37)
Albumin: 4.2 g/dL (ref 3.5–5.2)
Alkaline Phosphatase: 144 U/L — ABNORMAL HIGH (ref 39–117)
BUN: 15 mg/dL (ref 6–23)
CO2: 27 meq/L (ref 19–32)
Calcium: 9.4 mg/dL (ref 8.4–10.5)
Chloride: 102 meq/L (ref 96–112)
Creatinine, Ser: 1.8 mg/dL — ABNORMAL HIGH (ref 0.40–1.50)
GFR: 36.57 mL/min — ABNORMAL LOW (ref >60.00)
Glucose, Bld: 287 mg/dL — ABNORMAL HIGH (ref 70–99)
Potassium: 3.4 meq/L — ABNORMAL LOW (ref 3.5–5.1)
Sodium: 138 meq/L (ref 135–145)
Total Bilirubin: 1.3 mg/dL — ABNORMAL HIGH (ref 0.2–1.2)
Total Protein: 7.1 g/dL (ref 6.0–8.3)

## 2024-05-23 LAB — MICROALBUMIN / CREATININE URINE RATIO
Creatinine,U: 48.8 mg/dL
Microalb Creat Ratio: 62.4 mg/g — ABNORMAL HIGH (ref 0.0–30.0)
Microalb, Ur: 3 mg/dL — ABNORMAL HIGH (ref 0.0–1.9)

## 2024-05-23 LAB — LIPID PANEL
Cholesterol: 100 mg/dL (ref 0–200)
HDL: 41.1 mg/dL (ref >39.00)
LDL Cholesterol: 42 mg/dL (ref 0–99)
NonHDL: 58.5
Total CHOL/HDL Ratio: 2
Triglycerides: 84 mg/dL (ref 0.0–149.0)
VLDL: 16.8 mg/dL (ref 0.0–40.0)

## 2024-05-23 LAB — HEMOGLOBIN A1C: Hgb A1c MFr Bld: 14.9 % — ABNORMAL HIGH (ref 4.6–6.5)

## 2024-05-23 MED ORDER — RYBELSUS 14 MG PO TABS: 1.0000 | ORAL_TABLET | Freq: Every day | ORAL | 3 refills | Status: AC

## 2024-05-23 NOTE — Progress Notes (Signed)
   Subjective:   Patient ID: Michael Snyder, male    DOB: 12/28/1950, 73 y.o.   MRN: [id number]  The patient is here for physical. Pertinent topics discussed: Discussed the use of AI scribe software for clinical note transcription with the patient, who gave verbal consent to proceed. History of Present Illness Michael Snyder is a 73 year old male with diabetes who presents with elevated blood sugar levels.  He has experienced elevated blood sugar levels since discontinuing his insulin  pump a couple of months ago due to a supply issue. He is currently managing his diabetes with Toujeo  insulin  at night and Farxiga . He has been out of pioglitazone  since around May or April. He experienced a low blood sugar episode three to four days ago, which he managed by consuming food or drink. He does not use continuous glucose monitoring and checks his sugar levels as needed.  He is also taking thyroid  medication, amlodipine  for blood pressure, and atorvastatin  for cholesterol. He had stopped taking hydrochlorothiazide  as advised by his kidney doctor but resumed it after noticing an increase in his blood sugar levels.  He reports a knot on his shoulder that has been present for over six months, which he attributes to a strain from reaching out to help his daughter. The knot causes discomfort, especially when he sees it in the mirror.  No chest pain, tightness, heart racing, breathing issues, stomach problems, or changes in his feet. He checks his feet daily for any cuts or sores. He has had a recent eye exam and received a flu shot and both doses of the shingles vaccine.  PMH, [facility], social history reviewed and updated  Review of Systems  Constitutional: Negative.   HENT: Negative.    Eyes: Negative.   Respiratory:  Negative for cough, chest tightness and shortness of breath.   Cardiovascular:  Negative for chest pain, palpitations and leg swelling.  Gastrointestinal:  Negative for abdominal distention,  abdominal pain, constipation, diarrhea, nausea and vomiting.  Musculoskeletal:  Positive for arthralgias.  Skin: Negative.   Neurological: Negative.   Psychiatric/Behavioral: Negative.      Objective:  Physical Exam Constitutional:      Appearance: He is well-developed.  HENT:     Head: Normocephalic and atraumatic.  Cardiovascular:     Rate and Rhythm: Normal rate and regular rhythm.  Pulmonary:     Effort: Pulmonary effort is normal. No respiratory distress.     Breath sounds: Normal breath sounds. No wheezing or rales.  Abdominal:     General: Bowel sounds are normal. There is no distension.     Palpations: Abdomen is soft.     Tenderness: There is no abdominal tenderness.  Musculoskeletal:        General: Tenderness present.     Cervical back: Normal range of motion.  Skin:    General: Skin is warm and dry.  Neurological:     Mental Status: He is alert and oriented to person, place, and time.     Coordination: Coordination normal.     Vitals:   05/23/24 0913  BP: 120/80  Pulse: 88  Temp: 98 F (36.7 C)  TempSrc: Oral  SpO2: 98%  Weight: 162 lb (73.5 kg)  Height: 6' (1.829 m)    Assessment & Plan:  Flu shot given at visit

## 2024-05-23 NOTE — Patient Instructions (Signed)
 We will check the labs and try to get you in with another endocrinologist to help you get back on the insulin  pump.

## 2024-05-26 ENCOUNTER — Ambulatory Visit: Payer: Self-pay | Admitting: Internal Medicine

## 2024-05-26 NOTE — Assessment & Plan Note (Signed)
 Referral to sports medicine for chronic pain.

## 2024-05-26 NOTE — Progress Notes (Unsigned)
 [facility] [address] [place] [address] Phone: [phone]   Assessment and Plan:     1. Chronic right shoulder pain (Primary) 2. Pain in right acromioclavicular joint -Chronic with exacerbation, subsequent visit - Recurrence of right shoulder pain x 3 months.  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  New right shoulder pain x 3 months localized to [facility] joint with localized swelling consistent with AC joint osteoarthritis and ganglion cyst formation due to physical activity - Offered aspiration and CSI.  Patient would prefer to avoid corticosteroid and needles if possible, so no injection performed at today's visit, but could be considered in the future - Use Tylenol  500 to 1000 mg tablets 2-3 times a day for day-to-day pain relief - If possible, recommend avoiding p.o. prednisone , NSAIDs due to past medical history, comorbidities - Topical Voltaren  gel over areas of pain - Start HEP for shoulder  15 additional minutes spent for educating Therapeutic Home Exercise Program.  This included exercises focusing on stretching, strengthening, with focus on eccentric aspects.   Long term goals include an improvement in range of motion, strength, endurance as well as avoiding reinjury. Patient's frequency would include in 1-2 times a day, 3-5 times a week for a duration of 6-12 weeks. Proper technique shown and discussed handout in great detail with ATC.  All questions were discussed and answered.      Pertinent previous records reviewed include none   Follow Up: As needed if no improvement or worsening of symptoms or if patient would like aspiration and CSI to [facility] joint   Subjective:   I, Kolbee Stallman, am serving as a neurosurgeon for Doctor Morene Mace  Chief Complaint: right shoulder pain   HPI:  03/12/2022 Patient is a 73 year old male complaining of right bicep pain. 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 Patient states pain came back about 3 months ago . Notes he has more deformity now     Relevant Historical Information: DM type II, CKD, hypertension Additional pertinent review of systems negative.   Current Outpatient Medications:    Alcohol  Swabs (EASY TOUCH ALCOHOL  PREP MEDIUM) 70 % PADS, Apply 1 each topically as directed., Disp: 200 each, Rfl: 5   amLODipine  (NORVASC ) 2.5 MG tablet, Take 2.5 mg by mouth daily., Disp: , Rfl:    aspirin EC 81 MG tablet, Take 81 mg daily by mouth., Disp: , Rfl:    atorvastatin  (LIPITOR) 20 MG tablet, TAKE 1 TABLET BY MOUTH DAILY, Disp: 100 tablet, Rfl: 2   Blood Pressure Monitoring (BLOOD PRESSURE KIT) DEVI, 1 kit by Does not apply route daily., Disp: 1 each, Rfl: 0   brimonidine (ALPHAGAN) 0.2 % ophthalmic solution, Place 1 drop into both eyes daily in the afternoon., Disp: , Rfl:    Calcium  Carbonate-Vitamin D  (CALTRATE 600+D) 600-400 MG-UNIT per tablet, Take 1 tablet by mouth 2 (two) times daily., Disp: 180 tablet, Rfl: 3   Cholecalciferol (VITAMIN D ) 50 MCG (2000  UT) tablet, Take 2,000 Units by mouth daily., Disp: , Rfl:    Continuous Glucose Receiver (DEXCOM G7 RECEIVER) DEVI, USE 1 DEVICE TO CHECK BLOOD  GLUCOSE CONTINUOUSLY., Disp: 1 each, Rfl: 0   Continuous Glucose Sensor (DEXCOM G7 SENSOR) MISC, USE AS DIRECTED CONTINUOUSLY, Disp: 10 each, Rfl: 2   Continuous Glucose Transmitter (DEXCOM G6 TRANSMITTER) MISC, CHANGE EVERY 3 MONTHS, Disp: 1 each, Rfl: 3   dapagliflozin  propanediol (FARXIGA ) 10 MG TABS tablet, Take 1  tablet (10 mg total) by mouth daily., Disp: 90 tablet, Rfl: 1   Glucagon  (GVOKE HYPOPEN  1-PACK) 1 MG/0.2ML SOAJ, Inject 1 mg into the skin as needed (low blood sugar with impaired consciousness)., Disp: 0.4 mL, Rfl: 2   glucose blood (ONETOUCH VERIO) test strip, TEST TWICE DAILY, Disp: 200 strip, Rfl: 0   hydrochlorothiazide  (HYDRODIURIL ) 25 MG tablet, Take 1 tablet (25 mg total) by mouth daily., Disp: 90 tablet, Rfl: 2   Insulin  Disposable Pump (OMNIPOD 5 G6 PODS, GEN 5,) MISC, 1 Device by Does not apply route every 3 (three) days., Disp: 15 each, Rfl: 3   insulin  lispro (HUMALOG ) 100 UNIT/ML injection, INJECT SUBCUTANEOUSLY 50 UNITS  DAILY VIA INSULIN  PUMP, Disp: 50 mL, Rfl: 2   Insulin  Pen Needle (PEN NEEDLES) 32G X 4 MM MISC, Use as Directed 3 times daily to inject insulin , Disp: 100 each, Rfl: 5   Lancets (ONETOUCH DELICA PLUS LANCET33G) MISC, CHECK BLOOD SUGAR TWICE DAILY, Disp: 200 each, Rfl: 0   latanoprost (XALATAN) 0.005 % ophthalmic solution, Place 1 drop into both eyes at bedtime., Disp: , Rfl:    levothyroxine  (SYNTHROID ) 25 MCG tablet, TAKE 1 TABLET BY MOUTH ONCE DAILY BEFORE BREAKFAST, Disp: 90 tablet, Rfl: 0   levothyroxine  (SYNTHROID ) 25 MCG tablet, TAKE 1 TABLET BY MOUTH ONCE DAILY BEFORE BREAKFAST, Disp: 90 tablet, Rfl: 0   linaclotide  (LINZESS ) 145 MCG CAPS capsule, Take 1 capsule (145 mcg total) by mouth daily before breakfast. (Patient taking differently: Take 145 mcg by mouth daily before breakfast. As needed), Disp: 100 capsule, Rfl: 3   metoprolol  succinate (TOPROL -XL) 25 MG 24 hr tablet, TAKE 1 TABLET BY MOUTH DAILY, Disp: 90 tablet, Rfl: 3   Multiple Vitamin (MULTIVITAMIN WITH MINERALS) TABS tablet, Take 1 tablet by mouth daily., Disp: , Rfl:    Omega-3 Fatty Acids (FISH OIL ) 300 MG CAPS, Take 1 capsule (300 mg total) by mouth daily., Disp: 90 capsule, Rfl: 3   pioglitazone  (ACTOS ) 30 MG tablet, Take 1 tablet (30 mg total) by mouth daily., Disp: 90 tablet, Rfl: 3    polyethylene glycol powder (GLYCOLAX /MIRALAX ) 17 GM/SCOOP powder, Take 17 g by mouth 2 (two) times daily as needed., Disp: 255 g, Rfl: 0   potassium chloride  (MICRO-K ) 10 MEQ CR capsule, Take 10 mEq by mouth 2 (two) times daily., Disp: , Rfl:    Semaglutide  (RYBELSUS ) 14 MG TABS, Take 1 tablet (14 mg total) by mouth daily., Disp: 90 tablet, Rfl: 3   TOUJEO  SOLOSTAR 300 UNIT/ML Solostar Pen, INJECT SUBCUTANEOUSLY 35 UNITS  DAILY, Disp: 13.5 mL, Rfl: 2   triamcinolone  ointment (KENALOG ) 0.5 %, APPLY TO AFFECTED AREA(S)  TOPICALLY TWICE DAILY (Patient taking differently: 1 Application as needed.), Disp: 90 g, Rfl: 0   Objective:     Vitals:   05/31/24 0931  BP: 126/74  Pulse: 78  SpO2: 96%  Weight: 177 lb (80.3 kg)  Height: 6' (1.829 m)      Body mass index is 24.01 kg/m.    Physical  Exam:    Gen: Appears well, nad, nontoxic and pleasant Neuro:sensation intact, strength is 5/5, muscle tone wnl Skin: no suspicious lesion or defmority Psych: A&O, appropriate mood and affect  Right shoulder:  Mild Popeye deformity with biceps musculature generally grouping more distally compared with left. Swelling superficial to right AC joint not present on left.  Area is TTP, mobile.  No erythema, drainage, open lesion. No scapular winging FF 180, abd 180, int 0, ext 90 NTTP over the [place], clavicle,   coracoid, biceps groove, humerus, deltoid, trapezius, cervical spine Positive crossarm  Negative Spurling's test bilat FROM of neck    Electronically signed by:  Odis Mace D.CLEMENTEEN [facility] 10:15 AM 05/31/24

## 2024-05-26 NOTE — Assessment & Plan Note (Signed)
 Flu shot given. Pneumonia complete. Shingrix due at pharmacy. Tetanus up to date. Colonoscopy up to date. Counseled about sun safety and mole surveillance. Counseled about the dangers of distracted driving. Given 10 year screening recommendations.

## 2024-05-26 NOTE — Assessment & Plan Note (Signed)
Checking TSH and adjust as needed.  

## 2024-05-26 NOTE — Assessment & Plan Note (Signed)
 Suspect it will be poorly controlled given he is not on insulin  pump or actos . Checking UACR, HgA1c, lipid panel, CMP. Adjust as needed. He wants a new endocrinologist.

## 2024-05-26 NOTE — Assessment & Plan Note (Signed)
BP at goal and checking CMP and adjust as needed.

## 2024-05-26 NOTE — Assessment & Plan Note (Signed)
 Taking metoprolol  for rate control and sounds regular on exam today no EKG done. Taking aspirin for anticoagulation.

## 2024-05-26 NOTE — Assessment & Plan Note (Signed)
 Checking CMP and adjust as needed.

## 2024-05-31 ENCOUNTER — Ambulatory Visit: Admitting: Sports Medicine

## 2024-05-31 VITALS — BP 126/74 | HR 78 | Ht 72.0 in | Wt 177.0 lb

## 2024-05-31 DIAGNOSIS — G8929 Other chronic pain: Secondary | ICD-10-CM

## 2024-05-31 DIAGNOSIS — M25511 Pain in right shoulder: Secondary | ICD-10-CM

## 2024-05-31 NOTE — Patient Instructions (Signed)
 Voltaren  gel over areas of pain   Tylenol  [phone] mg 2-3 times a day for pain relief   Shoulder HEP   As needed follow up

## 2024-06-05 ENCOUNTER — Encounter

## 2024-06-06 ENCOUNTER — Other Ambulatory Visit: Payer: Self-pay | Admitting: "Endocrinology

## 2024-06-06 ENCOUNTER — Other Ambulatory Visit: Payer: Self-pay | Admitting: Student

## 2024-06-06 DIAGNOSIS — E118 Type 2 diabetes mellitus with unspecified complications: Secondary | ICD-10-CM

## 2024-06-06 DIAGNOSIS — E876 Hypokalemia: Secondary | ICD-10-CM

## 2024-06-06 DIAGNOSIS — Z794 Long term (current) use of insulin: Secondary | ICD-10-CM

## 2024-06-08 ENCOUNTER — Other Ambulatory Visit: Payer: Self-pay | Admitting: Student

## 2024-06-08 DIAGNOSIS — E876 Hypokalemia: Secondary | ICD-10-CM

## 2024-06-14 ENCOUNTER — Other Ambulatory Visit: Payer: Self-pay | Admitting: "Endocrinology

## 2024-06-14 ENCOUNTER — Other Ambulatory Visit: Payer: Self-pay | Admitting: Internal Medicine

## 2024-06-14 DIAGNOSIS — E118 Type 2 diabetes mellitus with unspecified complications: Secondary | ICD-10-CM

## 2024-06-15 NOTE — Telephone Encounter (Signed)
 Requested Prescriptions   Pending Prescriptions Disp Refills   Continuous Glucose Sensor (DEXCOM G7 SENSOR) MISC [Pharmacy Med Name: DEXCOM_G7 SENSOR] 10 each 2    Sig: USE AS DIRECTED CONTINUOUSLY

## 2024-06-22 ENCOUNTER — Other Ambulatory Visit: Payer: Self-pay | Admitting: Physician Assistant

## 2024-06-22 DIAGNOSIS — E876 Hypokalemia: Secondary | ICD-10-CM

## 2024-06-23 MED ORDER — METOPROLOL SUCCINATE ER 25 MG PO TB24: 25.0000 mg | ORAL_TABLET | Freq: Every day | ORAL | 1 refills | Status: AC

## 2024-07-03 ENCOUNTER — Encounter: Payer: Self-pay | Admitting: *Deleted

## 2024-07-03 ENCOUNTER — Other Ambulatory Visit: Payer: Self-pay | Admitting: "Endocrinology

## 2024-07-03 ENCOUNTER — Encounter

## 2024-07-03 ENCOUNTER — Ambulatory Visit

## 2024-07-03 DIAGNOSIS — I4729 Other ventricular tachycardia: Secondary | ICD-10-CM | POA: Diagnosis not present

## 2024-07-03 DIAGNOSIS — E119 Type 2 diabetes mellitus without complications: Secondary | ICD-10-CM

## 2024-07-03 NOTE — Progress Notes (Signed)
 Michael Snyder                                          MRN: [id number]   07/03/2024   The [facility] Quality Team Specialist reviewed this patient medical record for the purposes of chart review for care gap closure. The following were reviewed: chart review for care gap closure-glycemic status assessment.    [facility] Quality Team

## 2024-07-04 NOTE — Telephone Encounter (Signed)
 Requested Prescriptions   Pending Prescriptions Disp Refills   COMFORT EZ PEN NEEDLES 32G X 4 MM MISC [Pharmacy Med Name: COMFORT EZ PEN NEEDLES 32G] 200 each 2    Sig: USE AS DIRECTED TWICE DAILY

## 2024-07-05 LAB — CUP PACEART REMOTE DEVICE CHECK
Battery Impedance: 1255 Ohm
Battery Remaining Longevity: 58 mo
Battery Voltage: 2.77 V
Brady Statistic AP VP Percent: 0 %
Brady Statistic AP VS Percent: 29 %
Brady Statistic AS VP Percent: 1 %
Brady Statistic AS VS Percent: 70 %
Date Time Interrogation Session: 20251231142846
Implantable Lead Connection Status: 753985
Implantable Lead Connection Status: 753985
Implantable Lead Implant Date: 20080711
Implantable Lead Implant Date: 20150918
Implantable Lead Location: 753859
Implantable Lead Location: 753860
Implantable Lead Model: 5076
Implantable Lead Model: 5076
Implantable Pulse Generator Implant Date: 20150918
Lead Channel Impedance Value: 475 Ohm
Lead Channel Impedance Value: 509 Ohm
Lead Channel Pacing Threshold Amplitude: 0.75 V
Lead Channel Pacing Threshold Amplitude: 0.75 V
Lead Channel Pacing Threshold Pulse Width: 0.4 ms
Lead Channel Pacing Threshold Pulse Width: 0.4 ms
Lead Channel Setting Pacing Amplitude: 2 V
Lead Channel Setting Pacing Amplitude: 2.5 V
Lead Channel Setting Pacing Pulse Width: 0.4 ms
Lead Channel Setting Sensing Sensitivity: 2 mV
Zone Setting Status: 755011
Zone Setting Status: 755011

## 2024-07-07 ENCOUNTER — Ambulatory Visit: Payer: Self-pay | Admitting: Cardiology

## 2024-07-07 NOTE — Progress Notes (Signed)
 Remote PPM Transmission

## 2024-07-17 NOTE — Progress Notes (Signed)
 Michael Snyder                                          MRN: [id number]   07/17/2024   The [facility] Quality Team Specialist reviewed this patient medical record for the purposes of chart review for care gap closure. The following were reviewed: chart review for care gap closure-glycemic status assessment.    [facility] Quality Team

## 2024-10-02 ENCOUNTER — Encounter

## 2025-01-01 ENCOUNTER — Encounter

## 2025-03-28 ENCOUNTER — Ambulatory Visit

## 2025-04-02 ENCOUNTER — Encounter

## 2025-07-02 ENCOUNTER — Encounter
